# Patient Record
Sex: Male | Born: 1940 | Race: White | Hispanic: No | Smoking: Former smoker
Health system: Southern US, Community
[De-identification: ages and names within clinical notes are randomized; demographics above are authoritative.]

## PROBLEM LIST (undated history)

## (undated) DIAGNOSIS — I10 Essential (primary) hypertension: Secondary | ICD-10-CM

## (undated) DIAGNOSIS — I4891 Unspecified atrial fibrillation: Secondary | ICD-10-CM

## (undated) DIAGNOSIS — Z7901 Long term (current) use of anticoagulants: Secondary | ICD-10-CM

## (undated) DIAGNOSIS — J439 Emphysema, unspecified: Secondary | ICD-10-CM

## (undated) DIAGNOSIS — I639 Cerebral infarction, unspecified: Secondary | ICD-10-CM

## (undated) DIAGNOSIS — Z9229 Personal history of other drug therapy: Secondary | ICD-10-CM

## (undated) DIAGNOSIS — E079 Disorder of thyroid, unspecified: Secondary | ICD-10-CM

## (undated) DIAGNOSIS — J449 Chronic obstructive pulmonary disease, unspecified: Secondary | ICD-10-CM

## (undated) HISTORY — DX: Unspecified atrial fibrillation: I48.91

## (undated) HISTORY — PX: CORONARY ARTERY BYPASS GRAFT: SHX141

---

## 2001-10-30 ENCOUNTER — Inpatient Hospital Stay (HOSPITAL_COMMUNITY): Admission: EM | Admit: 2001-10-30 | Discharge: 2001-11-06 | Payer: Self-pay | Admitting: Cardiology

## 2001-10-31 ENCOUNTER — Encounter: Payer: Self-pay | Admitting: Cardiology

## 2001-11-01 ENCOUNTER — Encounter: Payer: Self-pay | Admitting: Thoracic Surgery (Cardiothoracic Vascular Surgery)

## 2001-11-02 ENCOUNTER — Encounter: Payer: Self-pay | Admitting: Thoracic Surgery (Cardiothoracic Vascular Surgery)

## 2001-11-03 ENCOUNTER — Encounter: Payer: Self-pay | Admitting: Surgery

## 2001-11-28 ENCOUNTER — Encounter
Admission: RE | Admit: 2001-11-28 | Discharge: 2001-11-28 | Payer: Self-pay | Admitting: Thoracic Surgery (Cardiothoracic Vascular Surgery)

## 2001-11-28 ENCOUNTER — Encounter: Payer: Self-pay | Admitting: Thoracic Surgery (Cardiothoracic Vascular Surgery)

## 2005-09-05 ENCOUNTER — Other Ambulatory Visit: Payer: Self-pay

## 2005-09-05 ENCOUNTER — Inpatient Hospital Stay: Payer: Self-pay | Admitting: Internal Medicine

## 2008-06-23 ENCOUNTER — Inpatient Hospital Stay: Payer: Self-pay | Admitting: Internal Medicine

## 2011-05-03 DIAGNOSIS — I639 Cerebral infarction, unspecified: Secondary | ICD-10-CM

## 2011-05-03 HISTORY — DX: Cerebral infarction, unspecified: I63.9

## 2012-07-15 ENCOUNTER — Inpatient Hospital Stay: Payer: Self-pay | Admitting: Family Medicine

## 2012-07-15 DIAGNOSIS — I4891 Unspecified atrial fibrillation: Secondary | ICD-10-CM | POA: Diagnosis not present

## 2012-07-15 DIAGNOSIS — R000 Tachycardia, unspecified: Secondary | ICD-10-CM | POA: Diagnosis not present

## 2012-07-15 DIAGNOSIS — I10 Essential (primary) hypertension: Secondary | ICD-10-CM | POA: Diagnosis not present

## 2012-07-15 DIAGNOSIS — F17200 Nicotine dependence, unspecified, uncomplicated: Secondary | ICD-10-CM | POA: Diagnosis not present

## 2012-07-15 DIAGNOSIS — I6350 Cerebral infarction due to unspecified occlusion or stenosis of unspecified cerebral artery: Secondary | ICD-10-CM | POA: Diagnosis not present

## 2012-07-15 LAB — APTT: Activated PTT: 39.1 secs — ABNORMAL HIGH (ref 23.6–35.9)

## 2012-07-15 LAB — TROPONIN I
Troponin-I: 1.7 ng/mL — ABNORMAL HIGH
Troponin-I: 1.3 ng/mL — ABNORMAL HIGH

## 2012-07-15 LAB — COMPREHENSIVE METABOLIC PANEL
Albumin: 3.9 g/dL (ref 3.4–5.0)
Alkaline Phosphatase: 74 U/L (ref 50–136)
Anion Gap: 7 (ref 7–16)
BUN: 15 mg/dL (ref 7–18)
Bilirubin,Total: 0.6 mg/dL (ref 0.2–1.0)
Calcium, Total: 9 mg/dL (ref 8.5–10.1)
Chloride: 103 mmol/L (ref 98–107)
Co2: 27 mmol/L (ref 21–32)
Creatinine: 0.89 mg/dL (ref 0.60–1.30)
EGFR (African American): >60
EGFR (Non-African Amer.): >60
Glucose: 92 mg/dL (ref 65–99)
Osmolality: 274 (ref 275–301)
Potassium: 4 mmol/L (ref 3.5–5.1)
SGOT(AST): 33 U/L (ref 15–37)
SGPT (ALT): 27 U/L (ref 12–78)
Sodium: 137 mmol/L (ref 136–145)
Total Protein: 7.7 g/dL (ref 6.4–8.2)

## 2012-07-15 LAB — CBC
HCT: 47.5 % (ref 40.0–52.0)
HGB: 16.7 g/dL (ref 13.0–18.0)
MCH: 34.2 pg — ABNORMAL HIGH (ref 26.0–34.0)
MCHC: 35.2 g/dL (ref 32.0–36.0)
MCV: 97 fL (ref 80–100)
Platelet: 233 10*3/uL (ref 150–440)
RBC: 4.89 10*6/uL (ref 4.40–5.90)
RDW: 13.8 % (ref 11.5–14.5)
WBC: 8.4 10*3/uL (ref 3.8–10.6)

## 2012-07-15 LAB — URINALYSIS, COMPLETE
Bilirubin,UR: NEGATIVE
Leukocyte Esterase: NEGATIVE
Protein: 30
Specific Gravity: 1.015 (ref 1.003–1.030)
Squamous Epithelial: NONE SEEN
WBC UR: <1 /HPF (ref 0–5)

## 2012-07-15 LAB — PROTIME-INR
INR: 1.1
Prothrombin Time: 14.2 secs (ref 11.5–14.7)

## 2012-07-15 LAB — CK TOTAL AND CKMB (NOT AT ARMC)
CK, Total: 113 U/L (ref 35–232)
CK-MB: 2.2 ng/mL (ref 0.5–3.6)

## 2012-07-16 LAB — LIPID PANEL
Cholesterol: 196 mg/dL (ref 0–200)
HDL Cholesterol: 43 mg/dL (ref 40–60)
Ldl Cholesterol, Calc: 131 mg/dL — ABNORMAL HIGH (ref 0–100)
Triglycerides: 111 mg/dL (ref 0–200)
VLDL Cholesterol, Calc: 22 mg/dL (ref 5–40)

## 2012-07-16 LAB — TSH: Thyroid Stimulating Horm: 24.6 u[IU]/mL — ABNORMAL HIGH

## 2012-07-16 LAB — TROPONIN I: Troponin-I: 1.1 ng/mL — ABNORMAL HIGH

## 2012-07-16 LAB — HEMOGLOBIN A1C: Hemoglobin A1C: 5.3 % (ref 4.2–6.3)

## 2012-07-18 LAB — CBC WITH DIFFERENTIAL/PLATELET
Basophil #: 0.1 10*3/uL (ref 0.0–0.1)
Basophil %: 0.8 %
Eosinophil #: 0.2 10*3/uL (ref 0.0–0.7)
Eosinophil %: 3.1 %
HGB: 15.5 g/dL (ref 13.0–18.0)
Lymphocyte #: 1.1 10*3/uL (ref 1.0–3.6)
MCHC: 35.7 g/dL (ref 32.0–36.0)
Neutrophil #: 5.3 10*3/uL (ref 1.4–6.5)
Neutrophil %: 73.2 %
Platelet: 201 10*3/uL (ref 150–440)
RDW: 13.7 % (ref 11.5–14.5)
WBC: 7.3 10*3/uL (ref 3.8–10.6)

## 2012-07-18 LAB — BASIC METABOLIC PANEL
BUN: 16 mg/dL (ref 7–18)
Calcium, Total: 8.7 mg/dL (ref 8.5–10.1)
Co2: 25 mmol/L (ref 21–32)
Creatinine: 0.74 mg/dL (ref 0.60–1.30)
EGFR (African American): >60
Glucose: 93 mg/dL (ref 65–99)
Osmolality: 278 (ref 275–301)
Sodium: 139 mmol/L (ref 136–145)

## 2012-07-20 DIAGNOSIS — I69921 Dysphasia following unspecified cerebrovascular disease: Secondary | ICD-10-CM | POA: Diagnosis not present

## 2012-07-20 DIAGNOSIS — I5022 Chronic systolic (congestive) heart failure: Secondary | ICD-10-CM | POA: Diagnosis not present

## 2012-07-20 DIAGNOSIS — I214 Non-ST elevation (NSTEMI) myocardial infarction: Secondary | ICD-10-CM | POA: Diagnosis not present

## 2012-07-20 DIAGNOSIS — J441 Chronic obstructive pulmonary disease with (acute) exacerbation: Secondary | ICD-10-CM | POA: Diagnosis not present

## 2012-07-20 DIAGNOSIS — I69920 Aphasia following unspecified cerebrovascular disease: Secondary | ICD-10-CM | POA: Diagnosis not present

## 2012-07-20 DIAGNOSIS — I4891 Unspecified atrial fibrillation: Secondary | ICD-10-CM | POA: Diagnosis not present

## 2012-07-23 DIAGNOSIS — I69920 Aphasia following unspecified cerebrovascular disease: Secondary | ICD-10-CM | POA: Diagnosis not present

## 2012-07-23 DIAGNOSIS — I5022 Chronic systolic (congestive) heart failure: Secondary | ICD-10-CM | POA: Diagnosis not present

## 2012-07-23 DIAGNOSIS — J441 Chronic obstructive pulmonary disease with (acute) exacerbation: Secondary | ICD-10-CM | POA: Diagnosis not present

## 2012-07-23 DIAGNOSIS — I214 Non-ST elevation (NSTEMI) myocardial infarction: Secondary | ICD-10-CM | POA: Diagnosis not present

## 2012-07-23 DIAGNOSIS — I69921 Dysphasia following unspecified cerebrovascular disease: Secondary | ICD-10-CM | POA: Diagnosis not present

## 2012-07-23 DIAGNOSIS — I4891 Unspecified atrial fibrillation: Secondary | ICD-10-CM | POA: Diagnosis not present

## 2012-07-24 DIAGNOSIS — J441 Chronic obstructive pulmonary disease with (acute) exacerbation: Secondary | ICD-10-CM | POA: Diagnosis not present

## 2012-07-24 DIAGNOSIS — I4891 Unspecified atrial fibrillation: Secondary | ICD-10-CM | POA: Diagnosis not present

## 2012-07-24 DIAGNOSIS — I5022 Chronic systolic (congestive) heart failure: Secondary | ICD-10-CM | POA: Diagnosis not present

## 2012-07-24 DIAGNOSIS — I214 Non-ST elevation (NSTEMI) myocardial infarction: Secondary | ICD-10-CM | POA: Diagnosis not present

## 2012-07-24 DIAGNOSIS — I69921 Dysphasia following unspecified cerebrovascular disease: Secondary | ICD-10-CM | POA: Diagnosis not present

## 2012-07-24 DIAGNOSIS — I69920 Aphasia following unspecified cerebrovascular disease: Secondary | ICD-10-CM | POA: Diagnosis not present

## 2012-07-25 DIAGNOSIS — I214 Non-ST elevation (NSTEMI) myocardial infarction: Secondary | ICD-10-CM | POA: Diagnosis not present

## 2012-07-25 DIAGNOSIS — I4891 Unspecified atrial fibrillation: Secondary | ICD-10-CM | POA: Diagnosis not present

## 2012-07-25 DIAGNOSIS — J441 Chronic obstructive pulmonary disease with (acute) exacerbation: Secondary | ICD-10-CM | POA: Diagnosis not present

## 2012-07-25 DIAGNOSIS — I5022 Chronic systolic (congestive) heart failure: Secondary | ICD-10-CM | POA: Diagnosis not present

## 2012-07-25 DIAGNOSIS — I69921 Dysphasia following unspecified cerebrovascular disease: Secondary | ICD-10-CM | POA: Diagnosis not present

## 2012-07-25 DIAGNOSIS — I69920 Aphasia following unspecified cerebrovascular disease: Secondary | ICD-10-CM | POA: Diagnosis not present

## 2012-07-27 DIAGNOSIS — J441 Chronic obstructive pulmonary disease with (acute) exacerbation: Secondary | ICD-10-CM | POA: Diagnosis not present

## 2012-07-27 DIAGNOSIS — I5022 Chronic systolic (congestive) heart failure: Secondary | ICD-10-CM | POA: Diagnosis not present

## 2012-07-27 DIAGNOSIS — I4891 Unspecified atrial fibrillation: Secondary | ICD-10-CM | POA: Diagnosis not present

## 2012-07-27 DIAGNOSIS — I69921 Dysphasia following unspecified cerebrovascular disease: Secondary | ICD-10-CM | POA: Diagnosis not present

## 2012-07-27 DIAGNOSIS — I214 Non-ST elevation (NSTEMI) myocardial infarction: Secondary | ICD-10-CM | POA: Diagnosis not present

## 2012-07-27 DIAGNOSIS — G459 Transient cerebral ischemic attack, unspecified: Secondary | ICD-10-CM | POA: Diagnosis not present

## 2012-07-27 DIAGNOSIS — I69920 Aphasia following unspecified cerebrovascular disease: Secondary | ICD-10-CM | POA: Diagnosis not present

## 2012-07-30 DIAGNOSIS — I69921 Dysphasia following unspecified cerebrovascular disease: Secondary | ICD-10-CM | POA: Diagnosis not present

## 2012-07-30 DIAGNOSIS — J441 Chronic obstructive pulmonary disease with (acute) exacerbation: Secondary | ICD-10-CM | POA: Diagnosis not present

## 2012-07-30 DIAGNOSIS — I5022 Chronic systolic (congestive) heart failure: Secondary | ICD-10-CM | POA: Diagnosis not present

## 2012-07-30 DIAGNOSIS — I214 Non-ST elevation (NSTEMI) myocardial infarction: Secondary | ICD-10-CM | POA: Diagnosis not present

## 2012-07-30 DIAGNOSIS — I4891 Unspecified atrial fibrillation: Secondary | ICD-10-CM | POA: Diagnosis not present

## 2012-07-30 DIAGNOSIS — I69920 Aphasia following unspecified cerebrovascular disease: Secondary | ICD-10-CM | POA: Diagnosis not present

## 2012-08-01 DIAGNOSIS — I69921 Dysphasia following unspecified cerebrovascular disease: Secondary | ICD-10-CM | POA: Diagnosis not present

## 2012-08-01 DIAGNOSIS — I5022 Chronic systolic (congestive) heart failure: Secondary | ICD-10-CM | POA: Diagnosis not present

## 2012-08-01 DIAGNOSIS — I69920 Aphasia following unspecified cerebrovascular disease: Secondary | ICD-10-CM | POA: Diagnosis not present

## 2012-08-01 DIAGNOSIS — I4891 Unspecified atrial fibrillation: Secondary | ICD-10-CM | POA: Diagnosis not present

## 2012-08-01 DIAGNOSIS — I214 Non-ST elevation (NSTEMI) myocardial infarction: Secondary | ICD-10-CM | POA: Diagnosis not present

## 2012-08-01 DIAGNOSIS — J441 Chronic obstructive pulmonary disease with (acute) exacerbation: Secondary | ICD-10-CM | POA: Diagnosis not present

## 2012-08-02 DIAGNOSIS — I69921 Dysphasia following unspecified cerebrovascular disease: Secondary | ICD-10-CM | POA: Diagnosis not present

## 2012-08-02 DIAGNOSIS — I214 Non-ST elevation (NSTEMI) myocardial infarction: Secondary | ICD-10-CM | POA: Diagnosis not present

## 2012-08-02 DIAGNOSIS — I69920 Aphasia following unspecified cerebrovascular disease: Secondary | ICD-10-CM | POA: Diagnosis not present

## 2012-08-02 DIAGNOSIS — I5022 Chronic systolic (congestive) heart failure: Secondary | ICD-10-CM | POA: Diagnosis not present

## 2012-08-02 DIAGNOSIS — J441 Chronic obstructive pulmonary disease with (acute) exacerbation: Secondary | ICD-10-CM | POA: Diagnosis not present

## 2012-08-02 DIAGNOSIS — I4891 Unspecified atrial fibrillation: Secondary | ICD-10-CM | POA: Diagnosis not present

## 2012-08-03 DIAGNOSIS — I69921 Dysphasia following unspecified cerebrovascular disease: Secondary | ICD-10-CM | POA: Diagnosis not present

## 2012-08-03 DIAGNOSIS — I69920 Aphasia following unspecified cerebrovascular disease: Secondary | ICD-10-CM | POA: Diagnosis not present

## 2012-08-03 DIAGNOSIS — J441 Chronic obstructive pulmonary disease with (acute) exacerbation: Secondary | ICD-10-CM | POA: Diagnosis not present

## 2012-08-03 DIAGNOSIS — I5022 Chronic systolic (congestive) heart failure: Secondary | ICD-10-CM | POA: Diagnosis not present

## 2012-08-03 DIAGNOSIS — I214 Non-ST elevation (NSTEMI) myocardial infarction: Secondary | ICD-10-CM | POA: Diagnosis not present

## 2012-08-03 DIAGNOSIS — I4891 Unspecified atrial fibrillation: Secondary | ICD-10-CM | POA: Diagnosis not present

## 2012-08-06 DIAGNOSIS — I5022 Chronic systolic (congestive) heart failure: Secondary | ICD-10-CM | POA: Diagnosis not present

## 2012-08-06 DIAGNOSIS — I69920 Aphasia following unspecified cerebrovascular disease: Secondary | ICD-10-CM | POA: Diagnosis not present

## 2012-08-06 DIAGNOSIS — I69921 Dysphasia following unspecified cerebrovascular disease: Secondary | ICD-10-CM | POA: Diagnosis not present

## 2012-08-06 DIAGNOSIS — I214 Non-ST elevation (NSTEMI) myocardial infarction: Secondary | ICD-10-CM | POA: Diagnosis not present

## 2012-08-06 DIAGNOSIS — G459 Transient cerebral ischemic attack, unspecified: Secondary | ICD-10-CM | POA: Diagnosis not present

## 2012-08-06 DIAGNOSIS — I4891 Unspecified atrial fibrillation: Secondary | ICD-10-CM | POA: Diagnosis not present

## 2012-08-06 DIAGNOSIS — J441 Chronic obstructive pulmonary disease with (acute) exacerbation: Secondary | ICD-10-CM | POA: Diagnosis not present

## 2012-08-07 DIAGNOSIS — I69920 Aphasia following unspecified cerebrovascular disease: Secondary | ICD-10-CM | POA: Diagnosis not present

## 2012-08-07 DIAGNOSIS — I69921 Dysphasia following unspecified cerebrovascular disease: Secondary | ICD-10-CM | POA: Diagnosis not present

## 2012-08-07 DIAGNOSIS — J441 Chronic obstructive pulmonary disease with (acute) exacerbation: Secondary | ICD-10-CM | POA: Diagnosis not present

## 2012-08-07 DIAGNOSIS — I4891 Unspecified atrial fibrillation: Secondary | ICD-10-CM | POA: Diagnosis not present

## 2012-08-07 DIAGNOSIS — I214 Non-ST elevation (NSTEMI) myocardial infarction: Secondary | ICD-10-CM | POA: Diagnosis not present

## 2012-08-07 DIAGNOSIS — I5022 Chronic systolic (congestive) heart failure: Secondary | ICD-10-CM | POA: Diagnosis not present

## 2012-08-08 DIAGNOSIS — I4891 Unspecified atrial fibrillation: Secondary | ICD-10-CM | POA: Diagnosis not present

## 2012-08-08 DIAGNOSIS — I69920 Aphasia following unspecified cerebrovascular disease: Secondary | ICD-10-CM | POA: Diagnosis not present

## 2012-08-08 DIAGNOSIS — I5022 Chronic systolic (congestive) heart failure: Secondary | ICD-10-CM | POA: Diagnosis not present

## 2012-08-08 DIAGNOSIS — J441 Chronic obstructive pulmonary disease with (acute) exacerbation: Secondary | ICD-10-CM | POA: Diagnosis not present

## 2012-08-08 DIAGNOSIS — I69921 Dysphasia following unspecified cerebrovascular disease: Secondary | ICD-10-CM | POA: Diagnosis not present

## 2012-08-08 DIAGNOSIS — I214 Non-ST elevation (NSTEMI) myocardial infarction: Secondary | ICD-10-CM | POA: Diagnosis not present

## 2012-08-10 DIAGNOSIS — I4891 Unspecified atrial fibrillation: Secondary | ICD-10-CM | POA: Diagnosis not present

## 2012-08-10 DIAGNOSIS — J441 Chronic obstructive pulmonary disease with (acute) exacerbation: Secondary | ICD-10-CM | POA: Diagnosis not present

## 2012-08-10 DIAGNOSIS — I69921 Dysphasia following unspecified cerebrovascular disease: Secondary | ICD-10-CM | POA: Diagnosis not present

## 2012-08-10 DIAGNOSIS — I214 Non-ST elevation (NSTEMI) myocardial infarction: Secondary | ICD-10-CM | POA: Diagnosis not present

## 2012-08-10 DIAGNOSIS — I5022 Chronic systolic (congestive) heart failure: Secondary | ICD-10-CM | POA: Diagnosis not present

## 2012-08-10 DIAGNOSIS — I69920 Aphasia following unspecified cerebrovascular disease: Secondary | ICD-10-CM | POA: Diagnosis not present

## 2012-08-13 DIAGNOSIS — J441 Chronic obstructive pulmonary disease with (acute) exacerbation: Secondary | ICD-10-CM | POA: Diagnosis not present

## 2012-08-13 DIAGNOSIS — I69921 Dysphasia following unspecified cerebrovascular disease: Secondary | ICD-10-CM | POA: Diagnosis not present

## 2012-08-13 DIAGNOSIS — I214 Non-ST elevation (NSTEMI) myocardial infarction: Secondary | ICD-10-CM | POA: Diagnosis not present

## 2012-08-13 DIAGNOSIS — I5022 Chronic systolic (congestive) heart failure: Secondary | ICD-10-CM | POA: Diagnosis not present

## 2012-08-13 DIAGNOSIS — I69920 Aphasia following unspecified cerebrovascular disease: Secondary | ICD-10-CM | POA: Diagnosis not present

## 2012-08-13 DIAGNOSIS — I4891 Unspecified atrial fibrillation: Secondary | ICD-10-CM | POA: Diagnosis not present

## 2012-08-14 DIAGNOSIS — I214 Non-ST elevation (NSTEMI) myocardial infarction: Secondary | ICD-10-CM | POA: Diagnosis not present

## 2012-08-14 DIAGNOSIS — I69921 Dysphasia following unspecified cerebrovascular disease: Secondary | ICD-10-CM | POA: Diagnosis not present

## 2012-08-14 DIAGNOSIS — I5022 Chronic systolic (congestive) heart failure: Secondary | ICD-10-CM | POA: Diagnosis not present

## 2012-08-14 DIAGNOSIS — J441 Chronic obstructive pulmonary disease with (acute) exacerbation: Secondary | ICD-10-CM | POA: Diagnosis not present

## 2012-08-14 DIAGNOSIS — I69920 Aphasia following unspecified cerebrovascular disease: Secondary | ICD-10-CM | POA: Diagnosis not present

## 2012-08-14 DIAGNOSIS — I4891 Unspecified atrial fibrillation: Secondary | ICD-10-CM | POA: Diagnosis not present

## 2012-08-16 DIAGNOSIS — I69921 Dysphasia following unspecified cerebrovascular disease: Secondary | ICD-10-CM | POA: Diagnosis not present

## 2012-08-16 DIAGNOSIS — I5022 Chronic systolic (congestive) heart failure: Secondary | ICD-10-CM | POA: Diagnosis not present

## 2012-08-16 DIAGNOSIS — J441 Chronic obstructive pulmonary disease with (acute) exacerbation: Secondary | ICD-10-CM | POA: Diagnosis not present

## 2012-08-16 DIAGNOSIS — I4891 Unspecified atrial fibrillation: Secondary | ICD-10-CM | POA: Diagnosis not present

## 2012-08-16 DIAGNOSIS — I214 Non-ST elevation (NSTEMI) myocardial infarction: Secondary | ICD-10-CM | POA: Diagnosis not present

## 2012-08-16 DIAGNOSIS — I69920 Aphasia following unspecified cerebrovascular disease: Secondary | ICD-10-CM | POA: Diagnosis not present

## 2012-08-20 DIAGNOSIS — I69920 Aphasia following unspecified cerebrovascular disease: Secondary | ICD-10-CM | POA: Diagnosis not present

## 2012-08-20 DIAGNOSIS — I4891 Unspecified atrial fibrillation: Secondary | ICD-10-CM | POA: Diagnosis not present

## 2012-08-20 DIAGNOSIS — I69921 Dysphasia following unspecified cerebrovascular disease: Secondary | ICD-10-CM | POA: Diagnosis not present

## 2012-08-20 DIAGNOSIS — I214 Non-ST elevation (NSTEMI) myocardial infarction: Secondary | ICD-10-CM | POA: Diagnosis not present

## 2012-08-20 DIAGNOSIS — I5022 Chronic systolic (congestive) heart failure: Secondary | ICD-10-CM | POA: Diagnosis not present

## 2012-08-20 DIAGNOSIS — J441 Chronic obstructive pulmonary disease with (acute) exacerbation: Secondary | ICD-10-CM | POA: Diagnosis not present

## 2012-08-22 DIAGNOSIS — J441 Chronic obstructive pulmonary disease with (acute) exacerbation: Secondary | ICD-10-CM | POA: Diagnosis not present

## 2012-08-22 DIAGNOSIS — I5022 Chronic systolic (congestive) heart failure: Secondary | ICD-10-CM | POA: Diagnosis not present

## 2012-08-22 DIAGNOSIS — I214 Non-ST elevation (NSTEMI) myocardial infarction: Secondary | ICD-10-CM | POA: Diagnosis not present

## 2012-08-22 DIAGNOSIS — I69921 Dysphasia following unspecified cerebrovascular disease: Secondary | ICD-10-CM | POA: Diagnosis not present

## 2012-08-22 DIAGNOSIS — I4891 Unspecified atrial fibrillation: Secondary | ICD-10-CM | POA: Diagnosis not present

## 2012-08-22 DIAGNOSIS — I69920 Aphasia following unspecified cerebrovascular disease: Secondary | ICD-10-CM | POA: Diagnosis not present

## 2012-08-23 DIAGNOSIS — I69921 Dysphasia following unspecified cerebrovascular disease: Secondary | ICD-10-CM | POA: Diagnosis not present

## 2012-08-23 DIAGNOSIS — J441 Chronic obstructive pulmonary disease with (acute) exacerbation: Secondary | ICD-10-CM | POA: Diagnosis not present

## 2012-08-23 DIAGNOSIS — I4891 Unspecified atrial fibrillation: Secondary | ICD-10-CM | POA: Diagnosis not present

## 2012-08-23 DIAGNOSIS — I69920 Aphasia following unspecified cerebrovascular disease: Secondary | ICD-10-CM | POA: Diagnosis not present

## 2012-08-23 DIAGNOSIS — I214 Non-ST elevation (NSTEMI) myocardial infarction: Secondary | ICD-10-CM | POA: Diagnosis not present

## 2012-08-23 DIAGNOSIS — I5022 Chronic systolic (congestive) heart failure: Secondary | ICD-10-CM | POA: Diagnosis not present

## 2012-08-27 DIAGNOSIS — I69921 Dysphasia following unspecified cerebrovascular disease: Secondary | ICD-10-CM | POA: Diagnosis not present

## 2012-08-27 DIAGNOSIS — I69920 Aphasia following unspecified cerebrovascular disease: Secondary | ICD-10-CM | POA: Diagnosis not present

## 2012-08-27 DIAGNOSIS — J441 Chronic obstructive pulmonary disease with (acute) exacerbation: Secondary | ICD-10-CM | POA: Diagnosis not present

## 2012-08-27 DIAGNOSIS — I4891 Unspecified atrial fibrillation: Secondary | ICD-10-CM | POA: Diagnosis not present

## 2012-08-27 DIAGNOSIS — I5022 Chronic systolic (congestive) heart failure: Secondary | ICD-10-CM | POA: Diagnosis not present

## 2012-08-27 DIAGNOSIS — I214 Non-ST elevation (NSTEMI) myocardial infarction: Secondary | ICD-10-CM | POA: Diagnosis not present

## 2012-08-29 DIAGNOSIS — I5022 Chronic systolic (congestive) heart failure: Secondary | ICD-10-CM | POA: Diagnosis not present

## 2012-08-29 DIAGNOSIS — I4891 Unspecified atrial fibrillation: Secondary | ICD-10-CM | POA: Diagnosis not present

## 2012-08-29 DIAGNOSIS — I214 Non-ST elevation (NSTEMI) myocardial infarction: Secondary | ICD-10-CM | POA: Diagnosis not present

## 2012-08-29 DIAGNOSIS — I69921 Dysphasia following unspecified cerebrovascular disease: Secondary | ICD-10-CM | POA: Diagnosis not present

## 2012-08-29 DIAGNOSIS — I69920 Aphasia following unspecified cerebrovascular disease: Secondary | ICD-10-CM | POA: Diagnosis not present

## 2012-08-29 DIAGNOSIS — J441 Chronic obstructive pulmonary disease with (acute) exacerbation: Secondary | ICD-10-CM | POA: Diagnosis not present

## 2012-08-31 DIAGNOSIS — J441 Chronic obstructive pulmonary disease with (acute) exacerbation: Secondary | ICD-10-CM | POA: Diagnosis not present

## 2012-08-31 DIAGNOSIS — I69921 Dysphasia following unspecified cerebrovascular disease: Secondary | ICD-10-CM | POA: Diagnosis not present

## 2012-08-31 DIAGNOSIS — I4891 Unspecified atrial fibrillation: Secondary | ICD-10-CM | POA: Diagnosis not present

## 2012-08-31 DIAGNOSIS — I5022 Chronic systolic (congestive) heart failure: Secondary | ICD-10-CM | POA: Diagnosis not present

## 2012-08-31 DIAGNOSIS — I214 Non-ST elevation (NSTEMI) myocardial infarction: Secondary | ICD-10-CM | POA: Diagnosis not present

## 2012-08-31 DIAGNOSIS — I69920 Aphasia following unspecified cerebrovascular disease: Secondary | ICD-10-CM | POA: Diagnosis not present

## 2012-09-03 DIAGNOSIS — I5022 Chronic systolic (congestive) heart failure: Secondary | ICD-10-CM | POA: Diagnosis not present

## 2012-09-03 DIAGNOSIS — I4891 Unspecified atrial fibrillation: Secondary | ICD-10-CM | POA: Diagnosis not present

## 2012-09-03 DIAGNOSIS — I69920 Aphasia following unspecified cerebrovascular disease: Secondary | ICD-10-CM | POA: Diagnosis not present

## 2012-09-03 DIAGNOSIS — I69921 Dysphasia following unspecified cerebrovascular disease: Secondary | ICD-10-CM | POA: Diagnosis not present

## 2012-09-03 DIAGNOSIS — J441 Chronic obstructive pulmonary disease with (acute) exacerbation: Secondary | ICD-10-CM | POA: Diagnosis not present

## 2012-09-03 DIAGNOSIS — I214 Non-ST elevation (NSTEMI) myocardial infarction: Secondary | ICD-10-CM | POA: Diagnosis not present

## 2012-09-05 DIAGNOSIS — I69920 Aphasia following unspecified cerebrovascular disease: Secondary | ICD-10-CM | POA: Diagnosis not present

## 2012-09-05 DIAGNOSIS — I69921 Dysphasia following unspecified cerebrovascular disease: Secondary | ICD-10-CM | POA: Diagnosis not present

## 2012-09-05 DIAGNOSIS — I5022 Chronic systolic (congestive) heart failure: Secondary | ICD-10-CM | POA: Diagnosis not present

## 2012-09-05 DIAGNOSIS — I214 Non-ST elevation (NSTEMI) myocardial infarction: Secondary | ICD-10-CM | POA: Diagnosis not present

## 2012-09-05 DIAGNOSIS — I4891 Unspecified atrial fibrillation: Secondary | ICD-10-CM | POA: Diagnosis not present

## 2012-09-05 DIAGNOSIS — J441 Chronic obstructive pulmonary disease with (acute) exacerbation: Secondary | ICD-10-CM | POA: Diagnosis not present

## 2012-09-06 ENCOUNTER — Ambulatory Visit: Payer: Self-pay | Admitting: Internal Medicine

## 2012-09-06 DIAGNOSIS — M7989 Other specified soft tissue disorders: Secondary | ICD-10-CM | POA: Diagnosis not present

## 2012-09-06 DIAGNOSIS — M25473 Effusion, unspecified ankle: Secondary | ICD-10-CM | POA: Diagnosis not present

## 2012-09-06 DIAGNOSIS — Z7901 Long term (current) use of anticoagulants: Secondary | ICD-10-CM | POA: Diagnosis not present

## 2012-09-06 DIAGNOSIS — S8990XA Unspecified injury of unspecified lower leg, initial encounter: Secondary | ICD-10-CM | POA: Diagnosis not present

## 2012-09-06 DIAGNOSIS — M25476 Effusion, unspecified foot: Secondary | ICD-10-CM | POA: Diagnosis not present

## 2012-09-06 DIAGNOSIS — S82209A Unspecified fracture of shaft of unspecified tibia, initial encounter for closed fracture: Secondary | ICD-10-CM | POA: Diagnosis not present

## 2012-09-06 DIAGNOSIS — I4891 Unspecified atrial fibrillation: Secondary | ICD-10-CM | POA: Diagnosis not present

## 2012-09-07 DIAGNOSIS — S93409A Sprain of unspecified ligament of unspecified ankle, initial encounter: Secondary | ICD-10-CM | POA: Diagnosis not present

## 2012-09-07 DIAGNOSIS — S93419A Sprain of calcaneofibular ligament of unspecified ankle, initial encounter: Secondary | ICD-10-CM | POA: Diagnosis not present

## 2012-09-10 DIAGNOSIS — I69920 Aphasia following unspecified cerebrovascular disease: Secondary | ICD-10-CM | POA: Diagnosis not present

## 2012-09-10 DIAGNOSIS — I5022 Chronic systolic (congestive) heart failure: Secondary | ICD-10-CM | POA: Diagnosis not present

## 2012-09-10 DIAGNOSIS — I4891 Unspecified atrial fibrillation: Secondary | ICD-10-CM | POA: Diagnosis not present

## 2012-09-10 DIAGNOSIS — J441 Chronic obstructive pulmonary disease with (acute) exacerbation: Secondary | ICD-10-CM | POA: Diagnosis not present

## 2012-09-10 DIAGNOSIS — I214 Non-ST elevation (NSTEMI) myocardial infarction: Secondary | ICD-10-CM | POA: Diagnosis not present

## 2012-09-10 DIAGNOSIS — I69921 Dysphasia following unspecified cerebrovascular disease: Secondary | ICD-10-CM | POA: Diagnosis not present

## 2012-09-11 ENCOUNTER — Ambulatory Visit: Payer: Self-pay | Admitting: Internal Medicine

## 2012-09-11 DIAGNOSIS — S93409A Sprain of unspecified ligament of unspecified ankle, initial encounter: Secondary | ICD-10-CM | POA: Diagnosis not present

## 2012-09-11 DIAGNOSIS — G459 Transient cerebral ischemic attack, unspecified: Secondary | ICD-10-CM | POA: Diagnosis not present

## 2012-09-11 DIAGNOSIS — S93419A Sprain of calcaneofibular ligament of unspecified ankle, initial encounter: Secondary | ICD-10-CM | POA: Diagnosis not present

## 2012-09-11 DIAGNOSIS — M7989 Other specified soft tissue disorders: Secondary | ICD-10-CM | POA: Diagnosis not present

## 2012-09-12 DIAGNOSIS — I5022 Chronic systolic (congestive) heart failure: Secondary | ICD-10-CM | POA: Diagnosis not present

## 2012-09-12 DIAGNOSIS — J441 Chronic obstructive pulmonary disease with (acute) exacerbation: Secondary | ICD-10-CM | POA: Diagnosis not present

## 2012-09-12 DIAGNOSIS — I4891 Unspecified atrial fibrillation: Secondary | ICD-10-CM | POA: Diagnosis not present

## 2012-09-12 DIAGNOSIS — I69920 Aphasia following unspecified cerebrovascular disease: Secondary | ICD-10-CM | POA: Diagnosis not present

## 2012-09-12 DIAGNOSIS — I69921 Dysphasia following unspecified cerebrovascular disease: Secondary | ICD-10-CM | POA: Diagnosis not present

## 2012-09-12 DIAGNOSIS — I214 Non-ST elevation (NSTEMI) myocardial infarction: Secondary | ICD-10-CM | POA: Diagnosis not present

## 2012-09-14 DIAGNOSIS — J441 Chronic obstructive pulmonary disease with (acute) exacerbation: Secondary | ICD-10-CM | POA: Diagnosis not present

## 2012-09-14 DIAGNOSIS — I4891 Unspecified atrial fibrillation: Secondary | ICD-10-CM | POA: Diagnosis not present

## 2012-09-14 DIAGNOSIS — I69921 Dysphasia following unspecified cerebrovascular disease: Secondary | ICD-10-CM | POA: Diagnosis not present

## 2012-09-14 DIAGNOSIS — I214 Non-ST elevation (NSTEMI) myocardial infarction: Secondary | ICD-10-CM | POA: Diagnosis not present

## 2012-09-14 DIAGNOSIS — I5022 Chronic systolic (congestive) heart failure: Secondary | ICD-10-CM | POA: Diagnosis not present

## 2012-09-14 DIAGNOSIS — I69920 Aphasia following unspecified cerebrovascular disease: Secondary | ICD-10-CM | POA: Diagnosis not present

## 2012-09-17 DIAGNOSIS — S82409A Unspecified fracture of shaft of unspecified fibula, initial encounter for closed fracture: Secondary | ICD-10-CM | POA: Diagnosis not present

## 2012-09-17 DIAGNOSIS — Z5189 Encounter for other specified aftercare: Secondary | ICD-10-CM | POA: Diagnosis not present

## 2012-09-17 DIAGNOSIS — S82209A Unspecified fracture of shaft of unspecified tibia, initial encounter for closed fracture: Secondary | ICD-10-CM | POA: Diagnosis not present

## 2012-09-17 DIAGNOSIS — S93409A Sprain of unspecified ligament of unspecified ankle, initial encounter: Secondary | ICD-10-CM | POA: Diagnosis not present

## 2012-10-01 DIAGNOSIS — G459 Transient cerebral ischemic attack, unspecified: Secondary | ICD-10-CM | POA: Diagnosis not present

## 2012-10-01 DIAGNOSIS — S93409A Sprain of unspecified ligament of unspecified ankle, initial encounter: Secondary | ICD-10-CM | POA: Diagnosis not present

## 2012-10-01 DIAGNOSIS — S82409A Unspecified fracture of shaft of unspecified fibula, initial encounter for closed fracture: Secondary | ICD-10-CM | POA: Diagnosis not present

## 2012-10-01 DIAGNOSIS — S82209A Unspecified fracture of shaft of unspecified tibia, initial encounter for closed fracture: Secondary | ICD-10-CM | POA: Diagnosis not present

## 2012-10-23 DIAGNOSIS — E785 Hyperlipidemia, unspecified: Secondary | ICD-10-CM | POA: Diagnosis not present

## 2012-10-23 DIAGNOSIS — R630 Anorexia: Secondary | ICD-10-CM | POA: Diagnosis not present

## 2012-10-23 DIAGNOSIS — D649 Anemia, unspecified: Secondary | ICD-10-CM | POA: Diagnosis not present

## 2012-10-23 DIAGNOSIS — R634 Abnormal weight loss: Secondary | ICD-10-CM | POA: Diagnosis not present

## 2012-10-23 DIAGNOSIS — I4891 Unspecified atrial fibrillation: Secondary | ICD-10-CM | POA: Diagnosis not present

## 2012-10-24 ENCOUNTER — Ambulatory Visit: Payer: Self-pay | Admitting: Internal Medicine

## 2012-10-24 DIAGNOSIS — R059 Cough, unspecified: Secondary | ICD-10-CM | POA: Diagnosis not present

## 2012-10-24 DIAGNOSIS — R0601 Orthopnea: Secondary | ICD-10-CM | POA: Diagnosis not present

## 2012-10-24 DIAGNOSIS — R918 Other nonspecific abnormal finding of lung field: Secondary | ICD-10-CM | POA: Diagnosis not present

## 2012-10-24 DIAGNOSIS — F17200 Nicotine dependence, unspecified, uncomplicated: Secondary | ICD-10-CM | POA: Diagnosis not present

## 2012-10-26 ENCOUNTER — Ambulatory Visit: Payer: Self-pay | Admitting: Internal Medicine

## 2012-10-26 DIAGNOSIS — K8020 Calculus of gallbladder without cholecystitis without obstruction: Secondary | ICD-10-CM | POA: Diagnosis not present

## 2012-10-26 DIAGNOSIS — R109 Unspecified abdominal pain: Secondary | ICD-10-CM | POA: Diagnosis not present

## 2012-10-26 DIAGNOSIS — R634 Abnormal weight loss: Secondary | ICD-10-CM | POA: Diagnosis not present

## 2012-11-08 DIAGNOSIS — I4891 Unspecified atrial fibrillation: Secondary | ICD-10-CM | POA: Diagnosis not present

## 2012-11-08 DIAGNOSIS — I471 Supraventricular tachycardia: Secondary | ICD-10-CM | POA: Diagnosis not present

## 2012-12-10 DIAGNOSIS — I10 Essential (primary) hypertension: Secondary | ICD-10-CM | POA: Diagnosis not present

## 2012-12-10 DIAGNOSIS — E785 Hyperlipidemia, unspecified: Secondary | ICD-10-CM | POA: Diagnosis not present

## 2012-12-10 DIAGNOSIS — R791 Abnormal coagulation profile: Secondary | ICD-10-CM | POA: Diagnosis not present

## 2012-12-10 DIAGNOSIS — R634 Abnormal weight loss: Secondary | ICD-10-CM | POA: Diagnosis not present

## 2012-12-10 DIAGNOSIS — Z0000 Encounter for general adult medical examination without abnormal findings: Secondary | ICD-10-CM | POA: Diagnosis not present

## 2012-12-10 DIAGNOSIS — T887XXA Unspecified adverse effect of drug or medicament, initial encounter: Secondary | ICD-10-CM | POA: Diagnosis not present

## 2012-12-17 DIAGNOSIS — I4891 Unspecified atrial fibrillation: Secondary | ICD-10-CM | POA: Diagnosis not present

## 2012-12-17 DIAGNOSIS — R791 Abnormal coagulation profile: Secondary | ICD-10-CM | POA: Diagnosis not present

## 2012-12-17 DIAGNOSIS — I471 Supraventricular tachycardia: Secondary | ICD-10-CM | POA: Diagnosis not present

## 2013-09-16 DIAGNOSIS — R000 Tachycardia, unspecified: Secondary | ICD-10-CM | POA: Diagnosis not present

## 2013-09-16 DIAGNOSIS — I4891 Unspecified atrial fibrillation: Secondary | ICD-10-CM | POA: Diagnosis not present

## 2013-09-16 DIAGNOSIS — Z7901 Long term (current) use of anticoagulants: Secondary | ICD-10-CM | POA: Diagnosis not present

## 2013-10-17 DIAGNOSIS — I4891 Unspecified atrial fibrillation: Secondary | ICD-10-CM | POA: Diagnosis not present

## 2013-11-14 DIAGNOSIS — I4891 Unspecified atrial fibrillation: Secondary | ICD-10-CM | POA: Diagnosis not present

## 2013-11-14 DIAGNOSIS — M67919 Unspecified disorder of synovium and tendon, unspecified shoulder: Secondary | ICD-10-CM | POA: Diagnosis not present

## 2013-11-14 DIAGNOSIS — M25519 Pain in unspecified shoulder: Secondary | ICD-10-CM | POA: Diagnosis not present

## 2013-11-14 DIAGNOSIS — R634 Abnormal weight loss: Secondary | ICD-10-CM | POA: Diagnosis not present

## 2013-11-14 DIAGNOSIS — M719 Bursopathy, unspecified: Secondary | ICD-10-CM | POA: Diagnosis not present

## 2013-11-26 DIAGNOSIS — M719 Bursopathy, unspecified: Secondary | ICD-10-CM | POA: Diagnosis not present

## 2013-11-26 DIAGNOSIS — M75100 Unspecified rotator cuff tear or rupture of unspecified shoulder, not specified as traumatic: Secondary | ICD-10-CM | POA: Diagnosis not present

## 2013-11-26 DIAGNOSIS — M67919 Unspecified disorder of synovium and tendon, unspecified shoulder: Secondary | ICD-10-CM | POA: Diagnosis not present

## 2013-11-26 DIAGNOSIS — IMO0002 Reserved for concepts with insufficient information to code with codable children: Secondary | ICD-10-CM | POA: Diagnosis not present

## 2013-12-17 DIAGNOSIS — I4891 Unspecified atrial fibrillation: Secondary | ICD-10-CM | POA: Diagnosis not present

## 2014-01-17 DIAGNOSIS — I4891 Unspecified atrial fibrillation: Secondary | ICD-10-CM | POA: Diagnosis not present

## 2014-02-14 DIAGNOSIS — I4891 Unspecified atrial fibrillation: Secondary | ICD-10-CM | POA: Diagnosis not present

## 2014-02-14 DIAGNOSIS — Z7901 Long term (current) use of anticoagulants: Secondary | ICD-10-CM | POA: Diagnosis not present

## 2014-03-04 DIAGNOSIS — Z23 Encounter for immunization: Secondary | ICD-10-CM | POA: Diagnosis not present

## 2014-03-07 DIAGNOSIS — Z7901 Long term (current) use of anticoagulants: Secondary | ICD-10-CM | POA: Diagnosis not present

## 2014-03-07 DIAGNOSIS — I4891 Unspecified atrial fibrillation: Secondary | ICD-10-CM | POA: Diagnosis not present

## 2014-03-07 DIAGNOSIS — R634 Abnormal weight loss: Secondary | ICD-10-CM | POA: Diagnosis not present

## 2014-04-16 DIAGNOSIS — Z720 Tobacco use: Secondary | ICD-10-CM | POA: Diagnosis not present

## 2014-04-16 DIAGNOSIS — F17200 Nicotine dependence, unspecified, uncomplicated: Secondary | ICD-10-CM | POA: Diagnosis not present

## 2014-04-16 DIAGNOSIS — I4891 Unspecified atrial fibrillation: Secondary | ICD-10-CM | POA: Diagnosis not present

## 2014-04-16 DIAGNOSIS — Z7901 Long term (current) use of anticoagulants: Secondary | ICD-10-CM | POA: Diagnosis not present

## 2014-04-16 DIAGNOSIS — Z1389 Encounter for screening for other disorder: Secondary | ICD-10-CM | POA: Diagnosis not present

## 2014-04-22 DIAGNOSIS — G459 Transient cerebral ischemic attack, unspecified: Secondary | ICD-10-CM | POA: Diagnosis not present

## 2014-04-22 DIAGNOSIS — Z7901 Long term (current) use of anticoagulants: Secondary | ICD-10-CM | POA: Diagnosis not present

## 2014-04-22 DIAGNOSIS — I4891 Unspecified atrial fibrillation: Secondary | ICD-10-CM | POA: Diagnosis not present

## 2014-05-30 DIAGNOSIS — K210 Gastro-esophageal reflux disease with esophagitis: Secondary | ICD-10-CM | POA: Diagnosis not present

## 2014-05-30 DIAGNOSIS — Z7901 Long term (current) use of anticoagulants: Secondary | ICD-10-CM | POA: Diagnosis not present

## 2014-05-30 DIAGNOSIS — G459 Transient cerebral ischemic attack, unspecified: Secondary | ICD-10-CM | POA: Diagnosis not present

## 2014-05-30 DIAGNOSIS — I4891 Unspecified atrial fibrillation: Secondary | ICD-10-CM | POA: Diagnosis not present

## 2014-08-22 NOTE — Discharge Summary (Signed)
PATIENT NAME:  Ronald [name], Ronald [name] MR#:  [id number] DATE OF BIRTH:  1940/05/31  DATE OF ADMISSION:  07/15/2012 DATE OF DISCHARGE:  07/19/2012  DISPOSITION:  Skilled nursing facility as a request from the daughter. Patient has been recommended to go home with home health not meeting specific criteria for discharge to skilled nursing facility, but the daughter is working out some payment options as private payment for her father to go to skilled nursing.   DISCHARGE DIAGNOSES:   1.  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  2.  Atrial fibrillation, new diagnosis.  3.  Chronic anticoagulation with Xarelto.  4.  Hypertensive emergency, now resolved.  5.  Non-ST elevation myocardial infarction, type 2, due to demand ischemia.  6.  Chronic systolic congestive heart failure with ejection fraction of 20 to 30%.  7.  Severe aortic stenosis.  8.  Hypothyroidism, noncompliant to treatment.  9.  Chronic obstructive pulmonary disease with mild exacerbation on antibiotics.   DISCHARGE MEDICATIONS:  All new medications are lisinopril 10 mg once daily, Xarelto 20 mg once daily, atorvastatin 20 mg once daily, aspirin 81 mg once daily, metoprolol 75 mg daily every 12 hours, fluticasone with salmeterol 250/50 mcg 2 times daily, Spiriva 18 mcg once a day, azithromycin 500 mg every 24 hours for 7 days, levothyroxine 125 mcg once a day.   FOLLOWUP:  Dr. Adrian Blackwater, Dr. Corky Downs and Dr. Conchita Paris in 1 to 2 weeks and follow up also with Dr. Cristopher Peru of neurology for the new CVA.   PERTINENT RESULTS:  CT of the head showing acute to subacute left MCA distribution infarct. MRI of the brain showing acute left frontal lobe infarct in the MCA territory and secondary microangiopathy as well. No hydrocephalus. Ultrasound of the carotid arteries showed no evidence of hemodynamically significant stenosis, only less than 50% stenosis visualized in the right internal carotid  artery. LDL was 131, triglycerides 111 and HDL 43. Hemoglobin A1c was 5.3. Electrolytes were within normal limits. Troponins were elevated at 1.7 down to 1.1. TSH was 24.6 (the patient not taking treatment). White count was 7.3, hemoglobin 15, platelets of 201. Urinalysis was within normal limits. EKG: Atrial fibrillation.   HOSPITAL COURSE:  The patient is a nice 74 year old gentleman with history of CAD, atrial fibrillation, COPD, GERD, hyperlipidemia, hypertension, hypothyroidism not taking treatments at home. He has a history of being a smoker for over 50 years and he drinks alcohol daily, beer, probably 2 beers today. He came to the ER with the chief complaint of not being able to talk. According to the patient's daughter, the patient was good until the morning of admission and then he just presented with facial droop and not able to talk. He was able to walk around, but he fell once on the floor with no injury. He was just very frustrated for not being able to talk. On admission, his troponins were elevated and diagnosed with acute cerebrovascular accident and a non-ST elevation MI. The patient was given Lovenox 1 mg/kg every 12 hours. MRI was done showing the acute infarct as well. He developed atrial fibrillation during this hospitalization for which he was recommended to be on Xarelto. He had hypertensive emergency, but we allow some hypertension. He was put on metoprolol and lisinopril due to his other issues. The patient was evaluated by PT, OT and speech therapy. He had significant problems with swallowing for which his diet has been changed to thickened fluids. The  patient did not require any formal assessments or evaluation by PT and OT. The patient is not having any physical problems and no physical findings of a stroke other than his facial droop and his aphasia. He is able to ambulate safely and he has full strength in 4 extremities. As far as his acute left MCA stroke, the patient is on aspirin  and now Xarelto due to his atrial fibrillation. As far as his aphasia, he needs to continue with speech therapy. As far as his hypertensive emergency, the patient is going to continue metoprolol and lisinopril. As far as his ST elevation MI, the patient is going on aspirin. He was on Plavix. We will stop it as the patient now is on Xarelto. He has significant CHF, but there were no major exacerbations here. His ejection fraction is 25% to 30%. Cardiology is considering adding on Aldactone, but at this moment, the patient isstable  and no need for it, but he will follow up with Dr. Adrian Blackwater as an outpatient. As far as hypothyroidism, he needs to have his TSH rechecked in the next 4 weeks as he has been restarted on Synthroid. As far as his COPD, the patient will continue Zithromax for another 7 days. The patient is discharged in good condition. We were going to discharge him home with home health PT. The daughter is trying to arrange him to go to a skilled nursing facility out of her own pocket. Again, the patient did not meet criteria or was appropriate to go to a skilled nursing facility as per physical therapy and occupational therapy.  ____________________________ Felipa Furnace, MD rsg:si D: 07/19/2012 15:14:00 ET T: 07/19/2012 15:39:05 ET JOB#: [id number] cc: Felipa Furnace, MD, <Dictator> Corleone Biegler Juanda Chance MD ELECTRONICALLY SIGNED 07/26/2012 21:46

## 2014-08-22 NOTE — Consult Note (Signed)
DATE OF BIRTH:  1940-08-29  DATE OF CONSULTATION:  07/15/2012  CONSULTING PHYSICIAN:  Corky Downs, MD  REPORT OF CONSULTATION:  This is a 74 year old male who was admitted into the hospital with accelerated hypertension and stroke affecting the left side of the body, with right facial weakness and right arm incoordination. He was able to walk to the car. The patient was also found to be in atrial fibrillation and elevated troponins, and need to be determined if elevated troponin is due to stroke or myocardial infarction, so CPK enzymes and troponin level will be needed. The patient unfortunately has not been taking any of his medication for the last 3 or 4 years. He continues to smoke heavily, 1 pack per day. His previous doctor told him that if he continued to smoke, then he is not going to live long, and  he continued to smoke at least 1 pack per day. Drinks about 2 beers a day. He has a history of bypass surgery, 7 vessel, at [facility]. The bypass surgery was done about 10 years ago.   PAST MEDICAL HISTORY:  CAD, atrial fibrillation, COPD, gastroesophageal reflux disease,  Dyslipidemia, hypertension.   SOCIAL HISTORY:  Smoked 1 pack per day for more than 50 years.   FAMILY HISTORY:  Father died at age 21, mother died in her 104s.   HOME MEDICATIONS:  None.   ALLERGIES:  No known allergies.   REVIEW OF SYSTEMS:  The patient has COPD with emphysema and chronic cough. There is no abdominal pain, no dysuria. No easy bruisability. No polyuria, polydipsia.   VITAL SIGNS:  Temperature is 97.6, blood pressure on arrival to the ER was 220/120, on the floor it was 144/96.   PHYSICAL EXAMINATION:  GENERAL: The patient is alert, cooperative, slightly confused. He is aphasic, with right-sided facial weakness and incoordination of the right hand. HEENT:  Head normocephalic. Pupils reactive. Tongue is moist, papillated.  NECK:  Supple. There is no bruit in the neck. No goiter.   CARDIOVASCULAR:  Apical impulse is not palpable. Both heart sounds are distant. Liver and spleen are not enlarged. No tenderness.  CHEST: Decreased breath sounds with bilateral rhonchi. There is no pedal edema.   LABORATORY DATA:  Chest x-ray revealed interstitial markings suggestive of interstitial edema. CT scan revealed subacute stroke in left middle cerebral artery territory. Carotid ultrasound does not show significant stenosis in the extracranial carotid artery. Electrocardiogram revealed atrial fibrillation with no acute EKG changes.   IMPRESSIONS:  1.  Atrial fibrillation.  2.  Acute cerebrovascular accident affecting the left middle cerebral artery territory.  3.  Subendocardial infarction. 4.  Uncontrolled hypertension. 5.  Coronary artery disease with a history of bypass surgery in the past. 6.   Chronic obstructive pulmonary disease.  7.  Severe tobacco abuse,    RECOMMENDATIONS:  I will start the patient on Pradaxa 75 mg p.o. twice a day, after we get a complete neurological evaluation. In the meantime, continue with Lovenox and aspirin. Control the blood pressure. Suggest Speech Therapy evaluation. Further evaluation of aspiration. Continue to use beta blocker to keep heart rate under control. The patient was advised to stop smoking, but I do not think he is going to do that. Continue inhaled bronchodilator.     ____________________________ Corky Downs, MD jm:mr D: 07/15/2012 18:39:18 ET T: 07/15/2012 21:38:48 ET JOB#: [id number]  cc: Corky Downs, MD, <Dictator> Corky Downs MD ELECTRONICALLY SIGNED 08/14/2012 17:42

## 2014-08-22 NOTE — H&P (Signed)
PATIENT NAME:  Ronald [name], Ronald [name] MR#:  [id number] DATE OF BIRTH:  [birth year]  DATE OF ADMISSION:  07/15/2012  PRIMARY CARE PHYSICIAN: Nonlocal REFERRING PHYSICIAN:   Lowella Fairy, MD    CHIEF COMPLAINT: Aphasia today.   HISTORY OF PRESENT ILLNESS: The patient is a 74 year old Caucasian male with a history of CAD, status post CABG, COPD, hypertension, presented to the ED with the above chief complaint. The patient is awake, alert, but cannot talk.  The patient can understand but is unable to provide information. According to the patient's daughter, the patient was fine until this morning. The patient could not talk, had a facial droop.  After he woke up, he fell on the floor but no obvious injury.  The patient can understand but cannot express himself. He denies any weakness or incontinence. No loss of consciousness, syncope or seizure. The patient was sent to the ED for further evaluation. CAT scan of the head showed infarction. The patient was treated with aspirin in the ED. Just now, troponin results come back which is at 1.7. According to the patient's daughter, the patient has been off any medication for the past 3 to 4 years. He has not seen any physician for a long time. He is still smoking 1 pack a day for more than 50 years,   PAST MEDICAL HISTORY:  CAD, afib, COPD, GERD, hyperlipidemia, hypertension, hypothyroidism.   SOCIAL HISTORY: He smokes 1 pack a day for more than 50 years, denies any alcohol drinking or illicit drugs.   FAMILY HISTORY: Father died at 48 of unknown cause. Mother died in 84s.  Sister with a heart problem and a CVA.  Mother had colon cancer.   HOME MEDICATIONS:  No.  ALLERGIES: No known drug allergies.    REVIEW OF SYSTEMS:  CONSTITUTIONAL: The patient denies any fever or chills. No headache or dizziness, no  weakness.  EYES: No double vision, blurred vision.  ENT: No epistaxis or postnasal drip. No slurred speech but has aphasia and choking.  CARDIOVASCULAR: No chest  pain, palpitation, orthopnea or nocturnal dyspnea. No leg edema.  PULMONARY: The patient has a chronic cough due to COPD and emphysema with sputum but no hemoptysis. No shortness of breath.  GASTROINTESTINAL: No abdominal pain, nausea, vomiting or diarrhea. No melena or bloody stool.  GENITOURINARY: No dysuria, hematuria or incontinence.  SKIN: No rash or jaundice.  HEMATOLOGY: No easy bruising, bleeding.  ENDOCRINOLOGY: No polyuria, polydipsia, heat or cold intolerance.  NEUROLOGY: No syncope, loss of consciousness or seizure but has aphasia, facial droop, no focal weakness.   PHYSICAL EXAMINATION: VITAL SIGNS: Temperature 97.6, blood pressure 212/120, pulse 137, respirations 26, O2 saturation 95% on room air.  GENERAL: The patient is alert, awake, oriented, but he cannot talk.  HEENT: Pupils are round, equal, reactive to light and accommodation. Moist oral mucosa.  NECK: Supple. No JVD or carotid bruits. No lymphadenopathy. No thyromegaly.  CARDIOVASCULAR: S1, S2 regular rate, tachycardia, no murmurs or gallops.  PULMONARY: Bilateral air entry and some crackles bilateral, no wheezing or rales, no use of accessory muscles to breathe.  ABDOMEN: Soft, no distention or tenderness. No organomegaly. Bowel sounds present.  EXTREMITIES: No edema, clubbing, or cyanosis. No calf tenderness. Strong bilateral pedal pulses.  SKIN: No rash or jaundice.  NEUROLOGIC: The patient is alert, awake, can understand but cannot talk.  Has a facial droop, otherwise no other focal weakness. Power 5 out of 5. Sensation intact.   LABORATORY, DIAGNOSTIC AND RADIOLOGIC DATA:  CAT scan  of head showed acute to subacute left MCA distribution infarct.  EKG shows sinus tachycardia with a first-degree AV block with a fusion complex at 125 bpm with a left anterior fascicular block, left ventricular hypertrophy with a QRS widening.   CBC normal.  PTT 39.1.  INR 1.1. CK 113, CK-MB 2.2. Glucose 92, BUN 15, creatinine 0.89,  sodium 137, potassium 4.0, chloride 103, bicarbonate 27. Troponin 1.7.   IMPRESSION:   1. Acute cerebrovascular accident.  2. Possible non ST-elevation myocardial infarction.  3. Hypertension emergency.  4. Sinus tachycardia.  5. Coronary artery disease.  6. Chronic obstructive pulmonary disease.  7. Tobacco abuse.   PLAN OF TREATMENT: 1. The patient will be admitted for tele floor. We will start O2 by nasal cannula, aspirin, Plavix. We will give Lovenox 1 mg/kg subcutaneous every 12 hours and give Lipitor.  2. We will check MRI of brain, echocardiograph, carotid duplex and a lipid panel. I discussed with Dr. Juel Burrow.  He will see the patient.  3. We will start aspiration and fall precautions, PT evaluation and speech study. We will start neuro checks, and we will get a Neurology consult from tele neurologist.  4. We will give Lopressor 5 mg IV p.r.n. if systolic blood pressure is more than 200 or diastolic more than 110. We will start lisinopril, Lopressor after swallowing and speech study.  5. For chronic obstructive pulmonary disease, which is stable now, we will give Advair and Xopenex p.r.n.   CODE STATUS: I discussed the patient's condition and the plan of treatment with the patient and the patient's daughter. According to the patient and the patient's daughter, the patient is DNR STATUS.   TIME SPENT: About 68 minutes.   ____________________________ Shaune Pollack, MD qc:cb D: 07/15/2012 14:01:16 ET T: 07/15/2012 17:28:34 ET JOB#: [id number]  cc: Shaune Pollack, MD, <Dictator> Shaune Pollack MD ELECTRONICALLY SIGNED 07/16/2012 16:20

## 2014-11-07 DIAGNOSIS — I4891 Unspecified atrial fibrillation: Secondary | ICD-10-CM | POA: Diagnosis not present

## 2014-11-07 DIAGNOSIS — I10 Essential (primary) hypertension: Secondary | ICD-10-CM | POA: Diagnosis not present

## 2014-11-07 DIAGNOSIS — G459 Transient cerebral ischemic attack, unspecified: Secondary | ICD-10-CM | POA: Diagnosis not present

## 2014-12-12 DIAGNOSIS — G459 Transient cerebral ischemic attack, unspecified: Secondary | ICD-10-CM | POA: Diagnosis not present

## 2014-12-12 DIAGNOSIS — I4891 Unspecified atrial fibrillation: Secondary | ICD-10-CM | POA: Diagnosis not present

## 2014-12-12 DIAGNOSIS — I10 Essential (primary) hypertension: Secondary | ICD-10-CM | POA: Diagnosis not present

## 2014-12-12 DIAGNOSIS — R634 Abnormal weight loss: Secondary | ICD-10-CM | POA: Diagnosis not present

## 2015-01-09 ENCOUNTER — Ambulatory Visit
Admission: RE | Admit: 2015-01-09 | Discharge: 2015-01-09 | Disposition: A | Discharge status: Home / Self Care | Payer: Medicare Other | Source: Ambulatory Visit | Attending: Cardiology | Admitting: Cardiology

## 2015-01-09 ENCOUNTER — Other Ambulatory Visit: Payer: Self-pay | Admitting: Internal Medicine

## 2015-01-09 ENCOUNTER — Ambulatory Visit
Admission: RE | Admit: 2015-01-09 | Discharge: 2015-01-09 | Disposition: A | Discharge status: Home / Self Care | Payer: Medicare Other | Source: Ambulatory Visit | Attending: Internal Medicine | Admitting: Internal Medicine

## 2015-01-09 DIAGNOSIS — J441 Chronic obstructive pulmonary disease with (acute) exacerbation: Secondary | ICD-10-CM | POA: Diagnosis not present

## 2015-01-09 DIAGNOSIS — R059 Cough, unspecified: Secondary | ICD-10-CM

## 2015-01-09 DIAGNOSIS — R05 Cough: Secondary | ICD-10-CM

## 2015-01-09 DIAGNOSIS — R0600 Dyspnea, unspecified: Secondary | ICD-10-CM | POA: Diagnosis present

## 2015-01-09 DIAGNOSIS — J449 Chronic obstructive pulmonary disease, unspecified: Secondary | ICD-10-CM | POA: Insufficient documentation

## 2015-01-09 DIAGNOSIS — I4891 Unspecified atrial fibrillation: Secondary | ICD-10-CM | POA: Diagnosis not present

## 2015-01-09 DIAGNOSIS — Z951 Presence of aortocoronary bypass graft: Secondary | ICD-10-CM | POA: Diagnosis not present

## 2015-01-09 DIAGNOSIS — R634 Abnormal weight loss: Secondary | ICD-10-CM | POA: Diagnosis not present

## 2015-01-09 DIAGNOSIS — F17200 Nicotine dependence, unspecified, uncomplicated: Secondary | ICD-10-CM | POA: Insufficient documentation

## 2015-01-09 DIAGNOSIS — E785 Hyperlipidemia, unspecified: Secondary | ICD-10-CM | POA: Diagnosis not present

## 2015-01-16 DIAGNOSIS — E079 Disorder of thyroid, unspecified: Secondary | ICD-10-CM | POA: Diagnosis not present

## 2015-01-16 DIAGNOSIS — I4891 Unspecified atrial fibrillation: Secondary | ICD-10-CM | POA: Diagnosis not present

## 2015-01-16 DIAGNOSIS — I10 Essential (primary) hypertension: Secondary | ICD-10-CM | POA: Diagnosis not present

## 2015-01-19 DIAGNOSIS — Z23 Encounter for immunization: Secondary | ICD-10-CM | POA: Diagnosis not present

## 2015-02-17 DIAGNOSIS — I4891 Unspecified atrial fibrillation: Secondary | ICD-10-CM | POA: Diagnosis not present

## 2015-02-17 DIAGNOSIS — E039 Hypothyroidism, unspecified: Secondary | ICD-10-CM | POA: Diagnosis not present

## 2015-02-17 DIAGNOSIS — G459 Transient cerebral ischemic attack, unspecified: Secondary | ICD-10-CM | POA: Diagnosis not present

## 2015-02-17 DIAGNOSIS — E785 Hyperlipidemia, unspecified: Secondary | ICD-10-CM | POA: Diagnosis not present

## 2015-03-20 DIAGNOSIS — G459 Transient cerebral ischemic attack, unspecified: Secondary | ICD-10-CM | POA: Diagnosis not present

## 2015-03-20 DIAGNOSIS — E039 Hypothyroidism, unspecified: Secondary | ICD-10-CM | POA: Diagnosis not present

## 2015-03-20 DIAGNOSIS — I4891 Unspecified atrial fibrillation: Secondary | ICD-10-CM | POA: Diagnosis not present

## 2015-03-20 DIAGNOSIS — R634 Abnormal weight loss: Secondary | ICD-10-CM | POA: Diagnosis not present

## 2015-06-19 DIAGNOSIS — G459 Transient cerebral ischemic attack, unspecified: Secondary | ICD-10-CM | POA: Diagnosis not present

## 2015-06-19 DIAGNOSIS — I4892 Unspecified atrial flutter: Secondary | ICD-10-CM | POA: Diagnosis not present

## 2015-06-19 DIAGNOSIS — I10 Essential (primary) hypertension: Secondary | ICD-10-CM | POA: Diagnosis not present

## 2015-06-19 DIAGNOSIS — E784 Other hyperlipidemia: Secondary | ICD-10-CM | POA: Diagnosis not present

## 2015-06-22 DIAGNOSIS — G459 Transient cerebral ischemic attack, unspecified: Secondary | ICD-10-CM | POA: Diagnosis not present

## 2015-06-22 DIAGNOSIS — E039 Hypothyroidism, unspecified: Secondary | ICD-10-CM | POA: Diagnosis not present

## 2015-06-22 DIAGNOSIS — I4891 Unspecified atrial fibrillation: Secondary | ICD-10-CM | POA: Diagnosis not present

## 2015-06-22 DIAGNOSIS — E785 Hyperlipidemia, unspecified: Secondary | ICD-10-CM | POA: Diagnosis not present

## 2015-07-21 DIAGNOSIS — R040 Epistaxis: Secondary | ICD-10-CM | POA: Diagnosis not present

## 2015-07-21 DIAGNOSIS — I4891 Unspecified atrial fibrillation: Secondary | ICD-10-CM | POA: Diagnosis not present

## 2015-07-21 DIAGNOSIS — G459 Transient cerebral ischemic attack, unspecified: Secondary | ICD-10-CM | POA: Diagnosis not present

## 2015-07-21 DIAGNOSIS — E039 Hypothyroidism, unspecified: Secondary | ICD-10-CM | POA: Diagnosis not present

## 2015-08-04 DIAGNOSIS — I4891 Unspecified atrial fibrillation: Secondary | ICD-10-CM | POA: Diagnosis not present

## 2015-08-04 DIAGNOSIS — E039 Hypothyroidism, unspecified: Secondary | ICD-10-CM | POA: Diagnosis not present

## 2015-08-04 DIAGNOSIS — E785 Hyperlipidemia, unspecified: Secondary | ICD-10-CM | POA: Diagnosis not present

## 2015-08-04 DIAGNOSIS — G459 Transient cerebral ischemic attack, unspecified: Secondary | ICD-10-CM | POA: Diagnosis not present

## 2015-09-01 DIAGNOSIS — I4891 Unspecified atrial fibrillation: Secondary | ICD-10-CM | POA: Diagnosis not present

## 2015-09-01 DIAGNOSIS — E039 Hypothyroidism, unspecified: Secondary | ICD-10-CM | POA: Diagnosis not present

## 2015-09-01 DIAGNOSIS — R634 Abnormal weight loss: Secondary | ICD-10-CM | POA: Diagnosis not present

## 2015-10-02 DIAGNOSIS — I4891 Unspecified atrial fibrillation: Secondary | ICD-10-CM | POA: Diagnosis not present

## 2015-10-02 DIAGNOSIS — G459 Transient cerebral ischemic attack, unspecified: Secondary | ICD-10-CM | POA: Diagnosis not present

## 2015-10-02 DIAGNOSIS — E039 Hypothyroidism, unspecified: Secondary | ICD-10-CM | POA: Diagnosis not present

## 2015-10-02 DIAGNOSIS — I10 Essential (primary) hypertension: Secondary | ICD-10-CM | POA: Diagnosis not present

## 2015-11-09 DIAGNOSIS — G459 Transient cerebral ischemic attack, unspecified: Secondary | ICD-10-CM | POA: Diagnosis not present

## 2015-11-09 DIAGNOSIS — E039 Hypothyroidism, unspecified: Secondary | ICD-10-CM | POA: Diagnosis not present

## 2015-11-09 DIAGNOSIS — I4891 Unspecified atrial fibrillation: Secondary | ICD-10-CM | POA: Diagnosis not present

## 2015-11-09 DIAGNOSIS — E785 Hyperlipidemia, unspecified: Secondary | ICD-10-CM | POA: Diagnosis not present

## 2015-12-07 DIAGNOSIS — E039 Hypothyroidism, unspecified: Secondary | ICD-10-CM | POA: Diagnosis not present

## 2015-12-07 DIAGNOSIS — I4891 Unspecified atrial fibrillation: Secondary | ICD-10-CM | POA: Diagnosis not present

## 2015-12-07 DIAGNOSIS — E785 Hyperlipidemia, unspecified: Secondary | ICD-10-CM | POA: Diagnosis not present

## 2015-12-07 DIAGNOSIS — G459 Transient cerebral ischemic attack, unspecified: Secondary | ICD-10-CM | POA: Diagnosis not present

## 2016-01-05 DIAGNOSIS — I4891 Unspecified atrial fibrillation: Secondary | ICD-10-CM | POA: Diagnosis not present

## 2016-01-05 DIAGNOSIS — G459 Transient cerebral ischemic attack, unspecified: Secondary | ICD-10-CM | POA: Diagnosis not present

## 2016-01-05 DIAGNOSIS — E039 Hypothyroidism, unspecified: Secondary | ICD-10-CM | POA: Diagnosis not present

## 2016-01-05 DIAGNOSIS — I10 Essential (primary) hypertension: Secondary | ICD-10-CM | POA: Diagnosis not present

## 2016-02-02 DIAGNOSIS — E039 Hypothyroidism, unspecified: Secondary | ICD-10-CM | POA: Diagnosis not present

## 2016-02-02 DIAGNOSIS — I4891 Unspecified atrial fibrillation: Secondary | ICD-10-CM | POA: Diagnosis not present

## 2016-02-02 DIAGNOSIS — I10 Essential (primary) hypertension: Secondary | ICD-10-CM | POA: Diagnosis not present

## 2016-02-02 DIAGNOSIS — G459 Transient cerebral ischemic attack, unspecified: Secondary | ICD-10-CM | POA: Diagnosis not present

## 2016-02-08 DIAGNOSIS — I4891 Unspecified atrial fibrillation: Secondary | ICD-10-CM | POA: Diagnosis not present

## 2016-02-08 DIAGNOSIS — E785 Hyperlipidemia, unspecified: Secondary | ICD-10-CM | POA: Diagnosis not present

## 2016-02-08 DIAGNOSIS — G459 Transient cerebral ischemic attack, unspecified: Secondary | ICD-10-CM | POA: Diagnosis not present

## 2016-02-08 DIAGNOSIS — R634 Abnormal weight loss: Secondary | ICD-10-CM | POA: Diagnosis not present

## 2016-03-04 ENCOUNTER — Other Ambulatory Visit: Payer: Self-pay | Admitting: Internal Medicine

## 2016-03-04 DIAGNOSIS — E119 Type 2 diabetes mellitus without complications: Secondary | ICD-10-CM | POA: Diagnosis not present

## 2016-03-04 DIAGNOSIS — I4891 Unspecified atrial fibrillation: Secondary | ICD-10-CM | POA: Diagnosis not present

## 2016-03-04 DIAGNOSIS — E784 Other hyperlipidemia: Secondary | ICD-10-CM | POA: Diagnosis not present

## 2016-03-04 DIAGNOSIS — R5381 Other malaise: Secondary | ICD-10-CM | POA: Diagnosis not present

## 2016-03-04 DIAGNOSIS — I10 Essential (primary) hypertension: Secondary | ICD-10-CM | POA: Diagnosis not present

## 2016-03-04 DIAGNOSIS — Z0000 Encounter for general adult medical examination without abnormal findings: Secondary | ICD-10-CM | POA: Diagnosis not present

## 2016-03-04 DIAGNOSIS — J449 Chronic obstructive pulmonary disease, unspecified: Secondary | ICD-10-CM

## 2016-03-04 DIAGNOSIS — R634 Abnormal weight loss: Secondary | ICD-10-CM

## 2016-03-07 ENCOUNTER — Ambulatory Visit
Admission: RE | Admit: 2016-03-07 | Discharge: 2016-03-07 | Disposition: A | Discharge status: Home / Self Care | Payer: Medicare Other | Source: Ambulatory Visit | Attending: Internal Medicine | Admitting: Internal Medicine

## 2016-03-07 DIAGNOSIS — M5134 Other intervertebral disc degeneration, thoracic region: Secondary | ICD-10-CM | POA: Insufficient documentation

## 2016-03-07 DIAGNOSIS — J449 Chronic obstructive pulmonary disease, unspecified: Secondary | ICD-10-CM | POA: Diagnosis not present

## 2016-03-07 DIAGNOSIS — I700 Atherosclerosis of aorta: Secondary | ICD-10-CM | POA: Insufficient documentation

## 2016-03-07 DIAGNOSIS — I517 Cardiomegaly: Secondary | ICD-10-CM | POA: Insufficient documentation

## 2016-03-07 DIAGNOSIS — R634 Abnormal weight loss: Secondary | ICD-10-CM

## 2016-03-07 DIAGNOSIS — M546 Pain in thoracic spine: Secondary | ICD-10-CM | POA: Diagnosis not present

## 2016-03-07 DIAGNOSIS — Z951 Presence of aortocoronary bypass graft: Secondary | ICD-10-CM | POA: Insufficient documentation

## 2016-04-04 DIAGNOSIS — E039 Hypothyroidism, unspecified: Secondary | ICD-10-CM | POA: Diagnosis not present

## 2016-04-04 DIAGNOSIS — E079 Disorder of thyroid, unspecified: Secondary | ICD-10-CM | POA: Diagnosis not present

## 2016-04-04 DIAGNOSIS — I10 Essential (primary) hypertension: Secondary | ICD-10-CM | POA: Diagnosis not present

## 2016-04-04 DIAGNOSIS — I4891 Unspecified atrial fibrillation: Secondary | ICD-10-CM | POA: Diagnosis not present

## 2016-05-05 DIAGNOSIS — I10 Essential (primary) hypertension: Secondary | ICD-10-CM | POA: Diagnosis not present

## 2016-05-05 DIAGNOSIS — I4891 Unspecified atrial fibrillation: Secondary | ICD-10-CM | POA: Diagnosis not present

## 2016-05-05 DIAGNOSIS — E039 Hypothyroidism, unspecified: Secondary | ICD-10-CM | POA: Diagnosis not present

## 2016-05-05 DIAGNOSIS — E079 Disorder of thyroid, unspecified: Secondary | ICD-10-CM | POA: Diagnosis not present

## 2016-06-06 DIAGNOSIS — I4891 Unspecified atrial fibrillation: Secondary | ICD-10-CM | POA: Diagnosis not present

## 2016-06-06 DIAGNOSIS — I10 Essential (primary) hypertension: Secondary | ICD-10-CM | POA: Diagnosis not present

## 2016-06-06 DIAGNOSIS — E039 Hypothyroidism, unspecified: Secondary | ICD-10-CM | POA: Diagnosis not present

## 2016-06-06 DIAGNOSIS — E079 Disorder of thyroid, unspecified: Secondary | ICD-10-CM | POA: Diagnosis not present

## 2016-06-06 DIAGNOSIS — R634 Abnormal weight loss: Secondary | ICD-10-CM | POA: Diagnosis not present

## 2016-08-02 DIAGNOSIS — I10 Essential (primary) hypertension: Secondary | ICD-10-CM | POA: Diagnosis not present

## 2016-08-02 DIAGNOSIS — E785 Hyperlipidemia, unspecified: Secondary | ICD-10-CM | POA: Diagnosis not present

## 2016-08-02 DIAGNOSIS — E039 Hypothyroidism, unspecified: Secondary | ICD-10-CM | POA: Diagnosis not present

## 2016-08-02 DIAGNOSIS — I4891 Unspecified atrial fibrillation: Secondary | ICD-10-CM | POA: Diagnosis not present

## 2016-08-31 DIAGNOSIS — E785 Hyperlipidemia, unspecified: Secondary | ICD-10-CM | POA: Diagnosis not present

## 2016-08-31 DIAGNOSIS — E079 Disorder of thyroid, unspecified: Secondary | ICD-10-CM | POA: Diagnosis not present

## 2016-08-31 DIAGNOSIS — R634 Abnormal weight loss: Secondary | ICD-10-CM | POA: Diagnosis not present

## 2016-08-31 DIAGNOSIS — I4891 Unspecified atrial fibrillation: Secondary | ICD-10-CM | POA: Diagnosis not present

## 2016-10-04 DIAGNOSIS — E785 Hyperlipidemia, unspecified: Secondary | ICD-10-CM | POA: Diagnosis not present

## 2016-10-04 DIAGNOSIS — I4891 Unspecified atrial fibrillation: Secondary | ICD-10-CM | POA: Diagnosis not present

## 2016-10-04 DIAGNOSIS — E079 Disorder of thyroid, unspecified: Secondary | ICD-10-CM | POA: Diagnosis not present

## 2016-10-04 DIAGNOSIS — R634 Abnormal weight loss: Secondary | ICD-10-CM | POA: Diagnosis not present

## 2016-11-01 DIAGNOSIS — I10 Essential (primary) hypertension: Secondary | ICD-10-CM | POA: Diagnosis not present

## 2016-11-01 DIAGNOSIS — E079 Disorder of thyroid, unspecified: Secondary | ICD-10-CM | POA: Diagnosis not present

## 2016-11-01 DIAGNOSIS — R634 Abnormal weight loss: Secondary | ICD-10-CM | POA: Diagnosis not present

## 2016-11-01 DIAGNOSIS — I4891 Unspecified atrial fibrillation: Secondary | ICD-10-CM | POA: Diagnosis not present

## 2016-11-15 DIAGNOSIS — I4891 Unspecified atrial fibrillation: Secondary | ICD-10-CM | POA: Diagnosis not present

## 2016-11-15 DIAGNOSIS — I10 Essential (primary) hypertension: Secondary | ICD-10-CM | POA: Diagnosis not present

## 2016-11-15 DIAGNOSIS — E039 Hypothyroidism, unspecified: Secondary | ICD-10-CM | POA: Diagnosis not present

## 2016-11-15 DIAGNOSIS — E785 Hyperlipidemia, unspecified: Secondary | ICD-10-CM | POA: Diagnosis not present

## 2016-11-25 DIAGNOSIS — E119 Type 2 diabetes mellitus without complications: Secondary | ICD-10-CM | POA: Diagnosis not present

## 2016-11-25 DIAGNOSIS — I4891 Unspecified atrial fibrillation: Secondary | ICD-10-CM | POA: Diagnosis not present

## 2016-11-25 DIAGNOSIS — E039 Hypothyroidism, unspecified: Secondary | ICD-10-CM | POA: Diagnosis not present

## 2016-11-25 DIAGNOSIS — E785 Hyperlipidemia, unspecified: Secondary | ICD-10-CM | POA: Diagnosis not present

## 2016-11-25 DIAGNOSIS — I10 Essential (primary) hypertension: Secondary | ICD-10-CM | POA: Diagnosis not present

## 2016-11-25 DIAGNOSIS — I4892 Unspecified atrial flutter: Secondary | ICD-10-CM | POA: Diagnosis not present

## 2016-11-25 DIAGNOSIS — R55 Syncope and collapse: Secondary | ICD-10-CM | POA: Diagnosis not present

## 2016-11-25 DIAGNOSIS — E784 Other hyperlipidemia: Secondary | ICD-10-CM | POA: Diagnosis not present

## 2016-11-25 DIAGNOSIS — R5381 Other malaise: Secondary | ICD-10-CM | POA: Diagnosis not present

## 2016-11-29 DIAGNOSIS — G459 Transient cerebral ischemic attack, unspecified: Secondary | ICD-10-CM | POA: Diagnosis not present

## 2016-11-29 DIAGNOSIS — E079 Disorder of thyroid, unspecified: Secondary | ICD-10-CM | POA: Diagnosis not present

## 2016-11-29 DIAGNOSIS — E785 Hyperlipidemia, unspecified: Secondary | ICD-10-CM | POA: Diagnosis not present

## 2016-11-29 DIAGNOSIS — R634 Abnormal weight loss: Secondary | ICD-10-CM | POA: Diagnosis not present

## 2016-12-05 DIAGNOSIS — I4891 Unspecified atrial fibrillation: Secondary | ICD-10-CM | POA: Diagnosis not present

## 2016-12-05 DIAGNOSIS — R55 Syncope and collapse: Secondary | ICD-10-CM | POA: Diagnosis not present

## 2016-12-05 DIAGNOSIS — I10 Essential (primary) hypertension: Secondary | ICD-10-CM | POA: Diagnosis not present

## 2016-12-05 DIAGNOSIS — E039 Hypothyroidism, unspecified: Secondary | ICD-10-CM | POA: Diagnosis not present

## 2016-12-19 DIAGNOSIS — E785 Hyperlipidemia, unspecified: Secondary | ICD-10-CM | POA: Diagnosis not present

## 2016-12-19 DIAGNOSIS — G459 Transient cerebral ischemic attack, unspecified: Secondary | ICD-10-CM | POA: Diagnosis not present

## 2016-12-19 DIAGNOSIS — R634 Abnormal weight loss: Secondary | ICD-10-CM | POA: Diagnosis not present

## 2016-12-19 DIAGNOSIS — E079 Disorder of thyroid, unspecified: Secondary | ICD-10-CM | POA: Diagnosis not present

## 2017-01-16 DIAGNOSIS — E039 Hypothyroidism, unspecified: Secondary | ICD-10-CM | POA: Diagnosis not present

## 2017-01-16 DIAGNOSIS — I10 Essential (primary) hypertension: Secondary | ICD-10-CM | POA: Diagnosis not present

## 2017-01-16 DIAGNOSIS — G459 Transient cerebral ischemic attack, unspecified: Secondary | ICD-10-CM | POA: Diagnosis not present

## 2017-01-16 DIAGNOSIS — I4891 Unspecified atrial fibrillation: Secondary | ICD-10-CM | POA: Diagnosis not present

## 2017-02-13 DIAGNOSIS — I4891 Unspecified atrial fibrillation: Secondary | ICD-10-CM | POA: Diagnosis not present

## 2017-02-13 DIAGNOSIS — G459 Transient cerebral ischemic attack, unspecified: Secondary | ICD-10-CM | POA: Diagnosis not present

## 2017-02-13 DIAGNOSIS — E039 Hypothyroidism, unspecified: Secondary | ICD-10-CM | POA: Diagnosis not present

## 2017-02-13 DIAGNOSIS — I10 Essential (primary) hypertension: Secondary | ICD-10-CM | POA: Diagnosis not present

## 2017-03-13 DIAGNOSIS — I10 Essential (primary) hypertension: Secondary | ICD-10-CM | POA: Diagnosis not present

## 2017-03-13 DIAGNOSIS — I4891 Unspecified atrial fibrillation: Secondary | ICD-10-CM | POA: Diagnosis not present

## 2017-03-13 DIAGNOSIS — E039 Hypothyroidism, unspecified: Secondary | ICD-10-CM | POA: Diagnosis not present

## 2017-03-13 DIAGNOSIS — E785 Hyperlipidemia, unspecified: Secondary | ICD-10-CM | POA: Diagnosis not present

## 2017-04-18 DIAGNOSIS — I4891 Unspecified atrial fibrillation: Secondary | ICD-10-CM | POA: Diagnosis not present

## 2017-04-18 DIAGNOSIS — E079 Disorder of thyroid, unspecified: Secondary | ICD-10-CM | POA: Diagnosis not present

## 2017-04-18 DIAGNOSIS — G459 Transient cerebral ischemic attack, unspecified: Secondary | ICD-10-CM | POA: Diagnosis not present

## 2017-04-18 DIAGNOSIS — R634 Abnormal weight loss: Secondary | ICD-10-CM | POA: Diagnosis not present

## 2017-05-19 DIAGNOSIS — E079 Disorder of thyroid, unspecified: Secondary | ICD-10-CM | POA: Diagnosis not present

## 2017-05-19 DIAGNOSIS — G459 Transient cerebral ischemic attack, unspecified: Secondary | ICD-10-CM | POA: Diagnosis not present

## 2017-05-19 DIAGNOSIS — I4891 Unspecified atrial fibrillation: Secondary | ICD-10-CM | POA: Diagnosis not present

## 2017-05-19 DIAGNOSIS — E785 Hyperlipidemia, unspecified: Secondary | ICD-10-CM | POA: Diagnosis not present

## 2017-05-19 DIAGNOSIS — R634 Abnormal weight loss: Secondary | ICD-10-CM | POA: Diagnosis not present

## 2017-05-25 DIAGNOSIS — I4891 Unspecified atrial fibrillation: Secondary | ICD-10-CM | POA: Diagnosis not present

## 2017-05-25 DIAGNOSIS — G459 Transient cerebral ischemic attack, unspecified: Secondary | ICD-10-CM | POA: Diagnosis not present

## 2017-05-25 DIAGNOSIS — E785 Hyperlipidemia, unspecified: Secondary | ICD-10-CM | POA: Diagnosis not present

## 2017-05-25 DIAGNOSIS — R634 Abnormal weight loss: Secondary | ICD-10-CM | POA: Diagnosis not present

## 2017-06-19 ENCOUNTER — Other Ambulatory Visit: Payer: Self-pay

## 2017-06-19 ENCOUNTER — Emergency Department (HOSPITAL_COMMUNITY): Payer: Medicare Other

## 2017-06-19 ENCOUNTER — Encounter (HOSPITAL_COMMUNITY)
Admission: EM | Disposition: A | Discharge status: Rehabilitation Facility | Payer: Self-pay | Source: Home / Self Care | Attending: Neurology

## 2017-06-19 ENCOUNTER — Inpatient Hospital Stay (HOSPITAL_COMMUNITY): Payer: Medicare Other

## 2017-06-19 ENCOUNTER — Inpatient Hospital Stay (HOSPITAL_COMMUNITY): Payer: Medicare Other | Admitting: Certified Registered Nurse Anesthetist

## 2017-06-19 ENCOUNTER — Encounter (HOSPITAL_COMMUNITY): Payer: Self-pay

## 2017-06-19 ENCOUNTER — Inpatient Hospital Stay (HOSPITAL_COMMUNITY)
Admission: EM | Admit: 2017-06-19 | Discharge: 2017-06-23 | DRG: 061 | Disposition: A | Discharge status: Rehabilitation Facility | Payer: Medicare Other | Attending: Neurology | Admitting: Neurology

## 2017-06-19 DIAGNOSIS — I619 Nontraumatic intracerebral hemorrhage, unspecified: Secondary | ICD-10-CM | POA: Diagnosis present

## 2017-06-19 DIAGNOSIS — I083 Combined rheumatic disorders of mitral, aortic and tricuspid valves: Secondary | ICD-10-CM | POA: Diagnosis present

## 2017-06-19 DIAGNOSIS — R402242 Coma scale, best verbal response, confused conversation, at arrival to emergency department: Secondary | ICD-10-CM | POA: Diagnosis present

## 2017-06-19 DIAGNOSIS — J9690 Respiratory failure, unspecified, unspecified whether with hypoxia or hypercapnia: Secondary | ICD-10-CM | POA: Diagnosis not present

## 2017-06-19 DIAGNOSIS — R634 Abnormal weight loss: Secondary | ICD-10-CM | POA: Diagnosis not present

## 2017-06-19 DIAGNOSIS — I2510 Atherosclerotic heart disease of native coronary artery without angina pectoris: Secondary | ICD-10-CM | POA: Diagnosis present

## 2017-06-19 DIAGNOSIS — I69354 Hemiplegia and hemiparesis following cerebral infarction affecting left non-dominant side: Secondary | ICD-10-CM | POA: Diagnosis not present

## 2017-06-19 DIAGNOSIS — I63411 Cerebral infarction due to embolism of right middle cerebral artery: Principal | ICD-10-CM | POA: Diagnosis present

## 2017-06-19 DIAGNOSIS — J9601 Acute respiratory failure with hypoxia: Secondary | ICD-10-CM | POA: Diagnosis not present

## 2017-06-19 DIAGNOSIS — I482 Chronic atrial fibrillation: Secondary | ICD-10-CM | POA: Diagnosis not present

## 2017-06-19 DIAGNOSIS — I6501 Occlusion and stenosis of right vertebral artery: Secondary | ICD-10-CM | POA: Diagnosis not present

## 2017-06-19 DIAGNOSIS — G8194 Hemiplegia, unspecified affecting left nondominant side: Secondary | ICD-10-CM | POA: Diagnosis present

## 2017-06-19 DIAGNOSIS — Z4682 Encounter for fitting and adjustment of non-vascular catheter: Secondary | ICD-10-CM | POA: Diagnosis not present

## 2017-06-19 DIAGNOSIS — I672 Cerebral atherosclerosis: Secondary | ICD-10-CM | POA: Diagnosis present

## 2017-06-19 DIAGNOSIS — I63431 Cerebral infarction due to embolism of right posterior cerebral artery: Secondary | ICD-10-CM | POA: Diagnosis present

## 2017-06-19 DIAGNOSIS — I639 Cerebral infarction, unspecified: Secondary | ICD-10-CM | POA: Diagnosis present

## 2017-06-19 DIAGNOSIS — R339 Retention of urine, unspecified: Secondary | ICD-10-CM | POA: Diagnosis present

## 2017-06-19 DIAGNOSIS — I169 Hypertensive crisis, unspecified: Secondary | ICD-10-CM | POA: Diagnosis not present

## 2017-06-19 DIAGNOSIS — Z931 Gastrostomy status: Secondary | ICD-10-CM | POA: Diagnosis not present

## 2017-06-19 DIAGNOSIS — D696 Thrombocytopenia, unspecified: Secondary | ICD-10-CM | POA: Diagnosis present

## 2017-06-19 DIAGNOSIS — R1310 Dysphagia, unspecified: Secondary | ICD-10-CM | POA: Diagnosis present

## 2017-06-19 DIAGNOSIS — E871 Hypo-osmolality and hyponatremia: Secondary | ICD-10-CM | POA: Diagnosis present

## 2017-06-19 DIAGNOSIS — E039 Hypothyroidism, unspecified: Secondary | ICD-10-CM | POA: Diagnosis present

## 2017-06-19 DIAGNOSIS — I63311 Cerebral infarction due to thrombosis of right middle cerebral artery: Secondary | ICD-10-CM | POA: Diagnosis not present

## 2017-06-19 DIAGNOSIS — F17210 Nicotine dependence, cigarettes, uncomplicated: Secondary | ICD-10-CM | POA: Diagnosis present

## 2017-06-19 DIAGNOSIS — I10 Essential (primary) hypertension: Secondary | ICD-10-CM | POA: Diagnosis present

## 2017-06-19 DIAGNOSIS — Z79890 Hormone replacement therapy (postmenopausal): Secondary | ICD-10-CM | POA: Diagnosis not present

## 2017-06-19 DIAGNOSIS — I69320 Aphasia following cerebral infarction: Secondary | ICD-10-CM

## 2017-06-19 DIAGNOSIS — R1312 Dysphagia, oropharyngeal phase: Secondary | ICD-10-CM | POA: Diagnosis not present

## 2017-06-19 DIAGNOSIS — R402122 Coma scale, eyes open, to pain, at arrival to emergency department: Secondary | ICD-10-CM | POA: Diagnosis present

## 2017-06-19 DIAGNOSIS — R471 Dysarthria and anarthria: Secondary | ICD-10-CM | POA: Diagnosis present

## 2017-06-19 DIAGNOSIS — I351 Nonrheumatic aortic (valve) insufficiency: Secondary | ICD-10-CM | POA: Diagnosis not present

## 2017-06-19 DIAGNOSIS — I651 Occlusion and stenosis of basilar artery: Secondary | ICD-10-CM | POA: Diagnosis not present

## 2017-06-19 DIAGNOSIS — G9340 Encephalopathy, unspecified: Secondary | ICD-10-CM | POA: Diagnosis present

## 2017-06-19 DIAGNOSIS — I6601 Occlusion and stenosis of right middle cerebral artery: Secondary | ICD-10-CM | POA: Diagnosis not present

## 2017-06-19 DIAGNOSIS — E876 Hypokalemia: Secondary | ICD-10-CM | POA: Diagnosis present

## 2017-06-19 DIAGNOSIS — Z7901 Long term (current) use of anticoagulants: Secondary | ICD-10-CM

## 2017-06-19 DIAGNOSIS — G459 Transient cerebral ischemic attack, unspecified: Secondary | ICD-10-CM | POA: Diagnosis not present

## 2017-06-19 DIAGNOSIS — I6300 Cerebral infarction due to thrombosis of unspecified precerebral artery: Secondary | ICD-10-CM | POA: Diagnosis not present

## 2017-06-19 DIAGNOSIS — J690 Pneumonitis due to inhalation of food and vomit: Secondary | ICD-10-CM | POA: Diagnosis present

## 2017-06-19 DIAGNOSIS — I6389 Other cerebral infarction: Secondary | ICD-10-CM | POA: Diagnosis not present

## 2017-06-19 DIAGNOSIS — I4891 Unspecified atrial fibrillation: Secondary | ICD-10-CM | POA: Diagnosis not present

## 2017-06-19 DIAGNOSIS — M25511 Pain in right shoulder: Secondary | ICD-10-CM | POA: Diagnosis not present

## 2017-06-19 DIAGNOSIS — J439 Emphysema, unspecified: Secondary | ICD-10-CM | POA: Diagnosis present

## 2017-06-19 DIAGNOSIS — I469 Cardiac arrest, cause unspecified: Secondary | ICD-10-CM | POA: Diagnosis not present

## 2017-06-19 DIAGNOSIS — R29718 NIHSS score 18: Secondary | ICD-10-CM | POA: Diagnosis present

## 2017-06-19 DIAGNOSIS — R29818 Other symptoms and signs involving the nervous system: Secondary | ICD-10-CM | POA: Diagnosis not present

## 2017-06-19 DIAGNOSIS — R609 Edema, unspecified: Secondary | ICD-10-CM | POA: Diagnosis not present

## 2017-06-19 DIAGNOSIS — I611 Nontraumatic intracerebral hemorrhage in hemisphere, cortical: Secondary | ICD-10-CM | POA: Diagnosis not present

## 2017-06-19 DIAGNOSIS — J9600 Acute respiratory failure, unspecified whether with hypoxia or hypercapnia: Secondary | ICD-10-CM | POA: Diagnosis present

## 2017-06-19 DIAGNOSIS — E785 Hyperlipidemia, unspecified: Secondary | ICD-10-CM | POA: Diagnosis present

## 2017-06-19 DIAGNOSIS — I6611 Occlusion and stenosis of right anterior cerebral artery: Secondary | ICD-10-CM | POA: Diagnosis not present

## 2017-06-19 DIAGNOSIS — R4182 Altered mental status, unspecified: Secondary | ICD-10-CM | POA: Diagnosis not present

## 2017-06-19 DIAGNOSIS — I69322 Dysarthria following cerebral infarction: Secondary | ICD-10-CM | POA: Diagnosis not present

## 2017-06-19 DIAGNOSIS — Z978 Presence of other specified devices: Secondary | ICD-10-CM

## 2017-06-19 DIAGNOSIS — R531 Weakness: Secondary | ICD-10-CM | POA: Diagnosis not present

## 2017-06-19 DIAGNOSIS — R29810 Facial weakness: Secondary | ICD-10-CM | POA: Diagnosis not present

## 2017-06-19 DIAGNOSIS — I63511 Cerebral infarction due to unspecified occlusion or stenosis of right middle cerebral artery: Secondary | ICD-10-CM | POA: Diagnosis not present

## 2017-06-19 DIAGNOSIS — R402352 Coma scale, best motor response, localizes pain, at arrival to emergency department: Secondary | ICD-10-CM | POA: Diagnosis present

## 2017-06-19 DIAGNOSIS — Z515 Encounter for palliative care: Secondary | ICD-10-CM

## 2017-06-19 DIAGNOSIS — F1010 Alcohol abuse, uncomplicated: Secondary | ICD-10-CM | POA: Diagnosis present

## 2017-06-19 DIAGNOSIS — J9811 Atelectasis: Secondary | ICD-10-CM | POA: Diagnosis not present

## 2017-06-19 HISTORY — DX: Disorder of thyroid, unspecified: E07.9

## 2017-06-19 HISTORY — PX: RADIOLOGY WITH ANESTHESIA: SHX6223

## 2017-06-19 HISTORY — DX: Cerebral infarction, unspecified: I63.9

## 2017-06-19 HISTORY — DX: Chronic obstructive pulmonary disease, unspecified: J44.9

## 2017-06-19 HISTORY — DX: Essential (primary) hypertension: I10

## 2017-06-19 HISTORY — PX: IR ANGIO VERTEBRAL SEL SUBCLAVIAN INNOMINATE UNI R MOD SED: IMG5365

## 2017-06-19 HISTORY — DX: Emphysema, unspecified: J43.9

## 2017-06-19 HISTORY — DX: Personal history of other drug therapy: Z92.29

## 2017-06-19 HISTORY — PX: IR ANGIO EXTRACRAN SEL COM CAROTID INNOMINATE UNI R MOD SED: IMG5356

## 2017-06-19 HISTORY — DX: Long term (current) use of anticoagulants: Z79.01

## 2017-06-19 LAB — DIFFERENTIAL
BASOS ABS: 0 10*3/uL (ref 0.0–0.1)
BASOS PCT: 1 %
Eosinophils Absolute: 0.6 10*3/uL (ref 0.0–0.7)
Eosinophils Relative: 11 %
Lymphocytes Relative: 23 %
Lymphs Abs: 1.3 10*3/uL (ref 0.7–4.0)
MONO ABS: 0.3 10*3/uL (ref 0.1–1.0)
Monocytes Relative: 5 %
NEUTROS PCT: 60 %
Neutro Abs: 3.5 10*3/uL (ref 1.7–7.7)

## 2017-06-19 LAB — COMPREHENSIVE METABOLIC PANEL
ALBUMIN: 3.8 g/dL (ref 3.5–5.0)
ALT: 19 U/L (ref 17–63)
AST: 30 U/L (ref 15–41)
Alkaline Phosphatase: 43 U/L (ref 38–126)
Anion gap: 11 (ref 5–15)
BUN: 10 mg/dL (ref 6–20)
CHLORIDE: 100 mmol/L — AB (ref 101–111)
CO2: 24 mmol/L (ref 22–32)
CREATININE: 0.8 mg/dL (ref 0.61–1.24)
Calcium: 8.9 mg/dL (ref 8.9–10.3)
GFR calc non Af Amer: >60 mL/min (ref >60)
Glucose, Bld: 119 mg/dL — ABNORMAL HIGH (ref 65–99)
Potassium: 3.4 mmol/L — ABNORMAL LOW (ref 3.5–5.1)
SODIUM: 135 mmol/L (ref 135–145)
Total Bilirubin: 0.6 mg/dL (ref 0.3–1.2)
Total Protein: 6.3 g/dL — ABNORMAL LOW (ref 6.5–8.1)

## 2017-06-19 LAB — CBC
HCT: 40.3 % (ref 39.0–52.0)
Hemoglobin: 14.4 g/dL (ref 13.0–17.0)
MCH: 35 pg — ABNORMAL HIGH (ref 26.0–34.0)
MCHC: 35.7 g/dL (ref 30.0–36.0)
MCV: 97.8 fL (ref 78.0–100.0)
PLATELETS: 146 10*3/uL — AB (ref 150–400)
RBC: 4.12 MIL/uL — ABNORMAL LOW (ref 4.22–5.81)
RDW: 13.3 % (ref 11.5–15.5)
WBC: 5.8 10*3/uL (ref 4.0–10.5)

## 2017-06-19 LAB — I-STAT CHEM 8, ED
BUN: 11 mg/dL (ref 6–20)
CHLORIDE: 98 mmol/L — AB (ref 101–111)
Calcium, Ion: 1.12 mmol/L — ABNORMAL LOW (ref 1.15–1.40)
Creatinine, Ser: 0.9 mg/dL (ref 0.61–1.24)
Glucose, Bld: 111 mg/dL — ABNORMAL HIGH (ref 65–99)
HEMATOCRIT: 43 % (ref 39.0–52.0)
Hemoglobin: 14.6 g/dL (ref 13.0–17.0)
POTASSIUM: 3.4 mmol/L — AB (ref 3.5–5.1)
SODIUM: 137 mmol/L (ref 135–145)
TCO2: 25 mmol/L (ref 22–32)

## 2017-06-19 LAB — CBG MONITORING, ED: Glucose-Capillary: 109 mg/dL — ABNORMAL HIGH (ref 65–99)

## 2017-06-19 LAB — ETHANOL: ALCOHOL ETHYL (B): 129 mg/dL — AB (ref <10)

## 2017-06-19 LAB — I-STAT TROPONIN, ED: Troponin i, poc: 0.02 ng/mL (ref 0.00–0.08)

## 2017-06-19 LAB — PROTIME-INR
INR: 1.23
PROTHROMBIN TIME: 15.4 s — AB (ref 11.4–15.2)

## 2017-06-19 LAB — APTT: APTT: 32 s (ref 24–36)

## 2017-06-19 SURGERY — RADIOLOGY WITH ANESTHESIA
Anesthesia: General

## 2017-06-19 MED ORDER — ACETAMINOPHEN 650 MG RE SUPP
650.0000 mg | RECTAL | Status: DC | PRN
  Administered 2017-06-20: 650 mg via RECTAL
  Filled 2017-06-19: qty 1

## 2017-06-19 MED ORDER — PROPOFOL 10 MG/ML IV BOLUS
INTRAVENOUS | Status: DC | PRN
  Administered 2017-06-19: 30 mg via INTRAVENOUS
  Administered 2017-06-19: 120 mg via INTRAVENOUS

## 2017-06-19 MED ORDER — ACETAMINOPHEN 325 MG PO TABS: 650.0000 mg | ORAL_TABLET | ORAL | Status: DC | PRN

## 2017-06-19 MED ORDER — ALTEPLASE (STROKE) FULL DOSE INFUSION
0.9000 mg/kg | Freq: Once | INTRAVENOUS | Status: AC
  Administered 2017-06-19: 68 mg via INTRAVENOUS
  Filled 2017-06-19: qty 100

## 2017-06-19 MED ORDER — LIDOCAINE HCL (CARDIAC) 20 MG/ML IV SOLN
INTRAVENOUS | Status: DC | PRN
  Administered 2017-06-19: 100 mg via INTRATRACHEAL

## 2017-06-19 MED ORDER — NITROGLYCERIN 1 MG/10 ML FOR IR/CATH LAB
INTRA_ARTERIAL | Status: AC
  Filled 2017-06-19: qty 10

## 2017-06-19 MED ORDER — IOPAMIDOL (ISOVUE-370) INJECTION 76%
INTRAVENOUS | Status: AC
  Filled 2017-06-19: qty 50

## 2017-06-19 MED ORDER — CEFAZOLIN SODIUM-DEXTROSE 2-4 GM/100ML-% IV SOLN
INTRAVENOUS | Status: AC
  Filled 2017-06-19: qty 100

## 2017-06-19 MED ORDER — LABETALOL HCL 5 MG/ML IV SOLN
10.0000 mg | Freq: Once | INTRAVENOUS | Status: AC | PRN
  Administered 2017-06-20: 10 mg via INTRAVENOUS
  Filled 2017-06-19: qty 4

## 2017-06-19 MED ORDER — FENTANYL CITRATE (PF) 100 MCG/2ML IJ SOLN
INTRAMUSCULAR | Status: DC | PRN
  Administered 2017-06-19: 100 ug via INTRAVENOUS

## 2017-06-19 MED ORDER — EPTIFIBATIDE 20 MG/10ML IV SOLN
INTRAVENOUS | Status: AC
  Filled 2017-06-19: qty 10

## 2017-06-19 MED ORDER — PHENYLEPHRINE HCL 10 MG/ML IJ SOLN
INTRAVENOUS | Status: DC | PRN
  Administered 2017-06-19: 20 ug/min via INTRAVENOUS

## 2017-06-19 MED ORDER — LACTATED RINGERS IV SOLN
INTRAVENOUS | Status: DC | PRN
  Administered 2017-06-19: 23:00:00 via INTRAVENOUS

## 2017-06-19 MED ORDER — IOPAMIDOL (ISOVUE-300) INJECTION 61%
INTRAVENOUS | Status: AC
  Administered 2017-06-19: 60 mL via INTRA_ARTERIAL
  Filled 2017-06-19: qty 100

## 2017-06-19 MED ORDER — CEFAZOLIN SODIUM-DEXTROSE 2-3 GM-%(50ML) IV SOLR
INTRAVENOUS | Status: DC | PRN
  Administered 2017-06-19: 2 g via INTRAVENOUS

## 2017-06-19 MED ORDER — STROKE: EARLY STAGES OF RECOVERY BOOK
Freq: Once | Status: AC
  Administered 2017-06-20: 03:00:00
  Filled 2017-06-19 (×2): qty 1

## 2017-06-19 MED ORDER — NICARDIPINE HCL IN NACL 20-0.86 MG/200ML-% IV SOLN
0.0000 mg/h | INTRAVENOUS | Status: DC | PRN
  Administered 2017-06-20: 5 mg/h via INTRAVENOUS
  Administered 2017-06-21: 7.5 mg/h via INTRAVENOUS
  Administered 2017-06-21: 3 mg/h via INTRAVENOUS
  Administered 2017-06-21 (×2): 5 mg/h via INTRAVENOUS
  Administered 2017-06-21: 7.5 mg/h via INTRAVENOUS
  Administered 2017-06-21: 5 mg/h via INTRAVENOUS
  Administered 2017-06-22: 10 mg/h via INTRAVENOUS
  Administered 2017-06-22: 7.5 mg/h via INTRAVENOUS
  Administered 2017-06-22: 3 mg/h via INTRAVENOUS
  Administered 2017-06-22: 15 mg/h via INTRAVENOUS
  Administered 2017-06-22: 5 mg/h via INTRAVENOUS
  Administered 2017-06-22 (×2): 10 mg/h via INTRAVENOUS
  Administered 2017-06-23: 5 mg/h via INTRAVENOUS
  Filled 2017-06-19 (×2): qty 200
  Filled 2017-06-19: qty 400
  Filled 2017-06-19 (×7): qty 200
  Filled 2017-06-19: qty 400
  Filled 2017-06-19 (×3): qty 200

## 2017-06-19 MED ORDER — FENTANYL CITRATE (PF) 100 MCG/2ML IJ SOLN
INTRAMUSCULAR | Status: AC
  Filled 2017-06-19: qty 2

## 2017-06-19 MED ORDER — PANTOPRAZOLE SODIUM 40 MG IV SOLR
40.0000 mg | Freq: Every day | INTRAVENOUS | Status: DC
Start: 2017-06-19 — End: 2017-06-23
  Administered 2017-06-19 – 2017-06-22 (×4): 40 mg via INTRAVENOUS
  Filled 2017-06-19 (×4): qty 40

## 2017-06-19 MED ORDER — SUCCINYLCHOLINE 20MG/ML (10ML) SYRINGE FOR MEDFUSION PUMP - OPTIME
INTRAMUSCULAR | Status: DC | PRN
  Administered 2017-06-19: 60 mg via INTRAVENOUS

## 2017-06-19 MED ORDER — SODIUM CHLORIDE 0.9 % IV SOLN
50.0000 mL/h | INTRAVENOUS | Status: DC
  Administered 2017-06-19 – 2017-06-22 (×4): 50 mL/h via INTRAVENOUS

## 2017-06-19 MED ORDER — ROCURONIUM 10MG/ML (10ML) SYRINGE FOR MEDFUSION PUMP - OPTIME
INTRAVENOUS | Status: DC | PRN
  Administered 2017-06-19: 30 mg via INTRAVENOUS
  Administered 2017-06-19: 20 mg via INTRAVENOUS

## 2017-06-19 MED ORDER — ACETAMINOPHEN 160 MG/5ML PO SOLN
650.0000 mg | ORAL | Status: DC | PRN
Start: 2017-06-19 — End: 2017-06-23

## 2017-06-19 NOTE — Anesthesia Preprocedure Evaluation (Addendum)
Anesthesia Evaluation  Patient identified by MRN, date of birth, ID band Patient unresponsive    Reviewed: Allergy & Precautions, NPO status , Patient's Chart, lab work & pertinent test results, Unable to perform ROS - Chart review onlyPreop documentation limited or incomplete due to emergent nature of procedure.  Airway Mallampati: II  TM Distance: >3 FB Neck ROM: Full    Dental  (+) Edentulous Upper, Edentulous Lower   Pulmonary COPD, Current Smoker,    breath sounds clear to auscultation + decreased breath sounds      Cardiovascular hypertension, Pt. on home beta blockers Normal cardiovascular exam Rhythm:Regular Rate:Normal     Neuro/Psych left sided weakness and facial droop CVA, Residual Symptoms negative psych ROS   GI/Hepatic negative GI ROS, Neg liver ROS,   Endo/Other  Hypothyroidism   Renal/GU negative Renal ROS     Musculoskeletal negative musculoskeletal ROS (+)   Abdominal   Peds  Hematology negative hematology ROS (+)   Anesthesia Other Findings Code Stroke  Reproductive/Obstetrics                            Anesthesia Physical Anesthesia Plan  ASA: IV and emergent  Anesthesia Plan: General   Post-op Pain Management:    Induction: Intravenous  PONV Risk Score and Plan: 1 and Ondansetron and Treatment may vary due to age or medical condition  Airway Management Planned: Oral ETT  Additional Equipment:   Intra-op Plan:   Post-operative Plan: Post-operative intubation/ventilation  Informed Consent: I have reviewed the patients History and Physical, chart, labs and discussed the procedure including the risks, benefits and alternatives for the proposed anesthesia with the patient or authorized representative who has indicated his/her understanding and acceptance.   Dental advisory given  Plan Discussed with: CRNA  Anesthesia Plan Comments:         Anesthesia  Quick Evaluation

## 2017-06-19 NOTE — Anesthesia Procedure Notes (Signed)
Procedure Name: Intubation Date/Time: 06/19/2017 11:12 PM Performed by: Claris Che, CRNA Pre-anesthesia Checklist: Patient identified, Emergency Drugs available, Suction available, Patient being monitored and Timeout performed Patient Re-evaluated:Patient Re-evaluated prior to induction Oxygen Delivery Method: Circle system utilized Preoxygenation: Pre-oxygenation with 100% oxygen Induction Type: IV induction, Rapid sequence and Cricoid Pressure applied Laryngoscope Size: Mac and 4 Grade View: Grade II Tube type: Subglottic suction tube Tube size: 7.5 mm Number of attempts: 1 Airway Equipment and Method: Stylet Placement Confirmation: ETT inserted through vocal cords under direct vision,  positive ETCO2 and breath sounds checked- equal and bilateral Secured at: 24 cm Tube secured with: Tape Dental Injury: Teeth and Oropharynx as per pre-operative assessment

## 2017-06-19 NOTE — ED Triage Notes (Addendum)
Per [facility], LSN 1700, pt was with wife at home. Pt went to the bathroom and then was found laying in the floor by wife to be aphasic with left sided weakness and facial droop. CBG 74, HR 80, BP 160/90, RR 16, spo2 95% on RA.

## 2017-06-19 NOTE — ED Provider Notes (Signed)
[facility]URGICAL [facility] Provider Note   CSN: [id number] Arrival date & time: 06/19/17  1911     History   Chief Complaint Chief Complaint  Patient presents with  . Code Stroke    HPI Ronald [name] is a 77 y.o. male.  HPI  77 year old male history of hypertension compliant with medications, prior cerebrovascular accident remote with Coumadin therapy anticoagulated, residual delayed speech but no focal deficits from prior stroke, presents the emergency department after EMS were called to residents by his roommate finding him with altered mental status after she heard him fall from standing in the bathroom.  The roommate found the patient with right arm right leg extended with eyes rolled back.  Denies any apnea.  Prior to this patient has no illness/trauma.  Past Medical History:  Diagnosis Date  . COPD (chronic obstructive pulmonary disease) (HCC)   . Emphysema lung (HCC)   . Hx of long term use of blood thinners   . Hypertension   . Stroke [place](HCC) 2013  . Thyroid disease    hypothyroidism    Patient Active Problem List   Diagnosis Date Noted  . 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 No pertinent surgical history.     Home Medications    Prior to Admission medications   Medication Sig Start Date End Date Taking? Authorizing Provider  Aspirin-Salicylamide-Caffeine (ARTHRITIS STRENGTH BC POWDER PO) Take 1 packet by mouth every hour.   Yes [provider]  gabapentin (NEURONTIN) 100 MG capsule Take 100 mg by mouth 3 (three) times daily as needed (back pain/spasms).  05/19/17  Yes [provider]  levothyroxine (SYNTHROID, LEVOTHROID) 125 MCG tablet Take 125 mcg by mouth daily after supper.  05/19/17  Yes [provider]  metoprolol tartrate (LOPRESSOR) 50 MG tablet Take 50 mg by mouth daily after supper.  05/19/17  Yes [provider]  warfarin (COUMADIN) 5 MG tablet Take 5 mg by mouth daily after supper.   05/19/17  Yes [provider]    Family History History reviewed. No pertinent family history.  Social History Social History   Tobacco Use  . Smoking status: Current Every Day Smoker    Packs/day: 1.00    Types: Cigarettes  Substance Use Topics  . Alcohol use: Yes    Comment: Drinks 2 or 3 beers per day  . Drug use: No     Allergies   Patient has no known allergies.   Review of Systems Review of Systems  Unable to perform ROS: Mental status change     Physical Exam Updated Vital Signs BP (!) 163/87 (BP Location: Left Arm)   Pulse 91   Temp (!) 97 F (36.1 C) (Oral)   Resp (!) 32   Ht 5\' 11"  (1.803 m)   Wt 72.5 kg (159 lb 13.3 oz)   SpO2 95%   BMI 22.29 kg/m   Physical Exam  Physical Exam Vitals:   06/19/17 2115 06/19/17 2130  BP: (!) 165/96 (!) 163/87  Pulse: 94 91  Resp: (!) 26 (!) 32  Temp:  (!) 97 F (36.1 C)  SpO2: 97% 95%   Constitutional: Patient is in mild acute distress Head: Normocephalic and atraumatic.  Eyes: Extraocular motion intact, no scleral icterus Neck: Supple without meningismus, mass, or overt JVD Respiratory: Effort normal and breath sounds normal. No respiratory distress. CV: Heart regular rate and rhythm, no obvious murmurs.  Pulses +2 and symmetric Abdomen: Soft, non-tender, non-distended MSK: Extremities are atraumatic  without deformity, ROM intact Skin: Warm, dry, intact Neuro: L sided facial droop; L sided UE/LE weakness; aphasia   ED Treatments / Results  Labs (all labs ordered are listed, but only abnormal results are displayed) Labs Reviewed  ETHANOL - Abnormal; Notable for the following components:      Result Value   Alcohol, Ethyl (B) 129 (*)    All other components within normal limits  PROTIME-INR - Abnormal; Notable for the following components:   Prothrombin Time 15.4 (*)    All other components within normal limits  CBC - Abnormal; Notable for the following components:   RBC 4.12 (*)    MCH  35.0 (*)    Platelets 146 (*)    All other components within normal limits  COMPREHENSIVE METABOLIC PANEL - Abnormal; Notable for the following components:   Potassium 3.4 (*)    Chloride 100 (*)    Glucose, Bld 119 (*)    Total Protein 6.3 (*)    All other components within normal limits  CBG MONITORING, ED - Abnormal; Notable for the following components:   Glucose-Capillary 109 (*)    All other components within normal limits  I-STAT CHEM 8, ED - Abnormal; Notable for the following components:   Potassium 3.4 (*)    Chloride 98 (*)    Glucose, Bld 111 (*)    Calcium, Ion 1.12 (*)    All other components within normal limits  MRSA PCR SCREENING  APTT  DIFFERENTIAL  RAPID URINE DRUG SCREEN, HOSP PERFORMED  URINALYSIS, ROUTINE W REFLEX MICROSCOPIC  CBC  HEMOGLOBIN A1C  LIPID PANEL  I-STAT TROPONIN, ED    EKG  EKG Interpretation None       Radiology Ct Angio Head W Or Wo Contrast  Result Date: 06/19/2017 CLINICAL DATA:  Code stroke presentation with left-sided weakness. EXAM: CT ANGIOGRAPHY HEAD AND NECK TECHNIQUE: Multidetector CT imaging of the head and neck was performed using the standard protocol during bolus administration of intravenous contrast. Multiplanar CT image reconstructions and MIPs were obtained to evaluate the vascular anatomy. Carotid stenosis measurements (when applicable) are obtained utilizing NASCET criteria, using the distal internal carotid diameter as the denominator. CONTRAST:  50 cc Isovue 370 COMPARISON:  Head CT earlier same day FINDINGS: CTA NECK FINDINGS Aortic arch: Aortic atherosclerosis but no aneurysm or dissection. Branching pattern of the brachiocephalic vessels from the arch is normal without origin stenosis. Right carotid system: Common carotid artery widely patent to the bifurcation region. Atherosclerotic plaque at the carotid bifurcation and proximal ICA. Minimal diameter in the ICA bulb is 4.5 mm. Compared to a more distal cervical ICA  diameter 4.5 mm, there is no stenosis. Left carotid system: Common carotid artery widely patent to the bifurcation. Calcified plaque at the carotid bifurcation and ICA bulb. Minimal diameter of the ICA bulb measures 3.2 mm. Compared to a more distal cervical ICA diameter of 4.5 mm, this indicates a 30-40% stenosis. Vertebral arteries: Right vertebral artery is dominant. Atherosclerotic calcification at the right vertebral artery origin with stenosis of 50%. Beyond that, the vessel is widely patent through the cervical region. Non dominant left vertebral artery shows calcified plaque at its origin with 50% stenosis. Beyond that, the vessel is patent through the cervical region to the foramen magnum. Skeleton: Degenerative spondylosis.  No bony canal stenosis. Other neck: No mass or lymphadenopathy. Upper chest: Mild emphysema and scarring. Review of the MIP images confirms the above findings CTA HEAD FINDINGS Anterior circulation: Both internal carotid arteries  are patent through the skull base and siphon regions. There is siphon atherosclerotic calcification maximal stenosis estimated at 30%. The A1 and M1 segments are patent on both sides. There is moderate stenosis in the right A1 segment. No M2 occlusion seen on the left. On the right, there appears to be occlusion of an anterior temporal M2 branch. There are serial stenoses in the right anterior cerebral artery. Posterior circulation: Dominant right vertebral artery shows 30% stenosis in the V4 segment but is patent to the basilar. Non dominant left vertebral artery shows 50% stenosis in the V4 segment but is patent to the basilar. No basilar stenosis. The basilar artery does show some atherosclerotic irregularity. Superior cerebellar and posterior cerebral arteries show flow. Patent posterior communicating artery on the right. Venous sinuses: Patent and normal. Limited opacification due to early face scanning. Anatomic variants: None significant. Delayed phase:  No abnormal enhancement. Review of the MIP images confirms the above findings IMPRESSION: Diffuse intracranial atherosclerotic disease. Apparent occlusion of a right insular/anterior temporal M2 branch. Serial severe stenoses in the right anterior cerebral artery. Atherosclerotic irregularity in both A1 segments. Atherosclerotic irregularity of the basilar artery without flow limiting stenosis. Aortic atherosclerosis. Carotid bifurcation atherosclerosis without stenosis. 50% vertebral artery origin stenoses. 30-50% V4 stenoses. Electronically Signed   By: Paulina Fusi M.D.   On: 06/19/2017 20:01   Ct Angio Neck W Or Wo Contrast  Result Date: 06/19/2017 CLINICAL DATA:  Code stroke presentation with left-sided weakness. EXAM: CT ANGIOGRAPHY HEAD AND NECK TECHNIQUE: Multidetector CT imaging of the head and neck was performed using the standard protocol during bolus administration of intravenous contrast. Multiplanar CT image reconstructions and MIPs were obtained to evaluate the vascular anatomy. Carotid stenosis measurements (when applicable) are obtained utilizing NASCET criteria, using the distal internal carotid diameter as the denominator. CONTRAST:  50 cc Isovue 370 COMPARISON:  Head CT earlier same day FINDINGS: CTA NECK FINDINGS Aortic arch: Aortic atherosclerosis but no aneurysm or dissection. Branching pattern of the brachiocephalic vessels from the arch is normal without origin stenosis. Right carotid system: Common carotid artery widely patent to the bifurcation region. Atherosclerotic plaque at the carotid bifurcation and proximal ICA. Minimal diameter in the ICA bulb is 4.5 mm. Compared to a more distal cervical ICA diameter 4.5 mm, there is no stenosis. Left carotid system: Common carotid artery widely patent to the bifurcation. Calcified plaque at the carotid bifurcation and ICA bulb. Minimal diameter of the ICA bulb measures 3.2 mm. Compared to a more distal cervical ICA diameter of 4.5 mm, this  indicates a 30-40% stenosis. Vertebral arteries: Right vertebral artery is dominant. Atherosclerotic calcification at the right vertebral artery origin with stenosis of 50%. Beyond that, the vessel is widely patent through the cervical region. Non dominant left vertebral artery shows calcified plaque at its origin with 50% stenosis. Beyond that, the vessel is patent through the cervical region to the foramen magnum. Skeleton: Degenerative spondylosis.  No bony canal stenosis. Other neck: No mass or lymphadenopathy. Upper chest: Mild emphysema and scarring. Review of the MIP images confirms the above findings CTA HEAD FINDINGS Anterior circulation: Both internal carotid arteries are patent through the skull base and siphon regions. There is siphon atherosclerotic calcification maximal stenosis estimated at 30%. The A1 and M1 segments are patent on both sides. There is moderate stenosis in the right A1 segment. No M2 occlusion seen on the left. On the right, there appears to be occlusion of an anterior temporal M2 branch. There are serial  stenoses in the right anterior cerebral artery. Posterior circulation: Dominant right vertebral artery shows 30% stenosis in the V4 segment but is patent to the basilar. Non dominant left vertebral artery shows 50% stenosis in the V4 segment but is patent to the basilar. No basilar stenosis. The basilar artery does show some atherosclerotic irregularity. Superior cerebellar and posterior cerebral arteries show flow. Patent posterior communicating artery on the right. Venous sinuses: Patent and normal. Limited opacification due to early face scanning. Anatomic variants: None significant. Delayed phase: No abnormal enhancement. Review of the MIP images confirms the above findings IMPRESSION: Diffuse intracranial atherosclerotic disease. Apparent occlusion of a right insular/anterior temporal M2 branch. Serial severe stenoses in the right anterior cerebral artery. Atherosclerotic  irregularity in both A1 segments. Atherosclerotic irregularity of the basilar artery without flow limiting stenosis. Aortic atherosclerosis. Carotid bifurcation atherosclerosis without stenosis. 50% vertebral artery origin stenoses. 30-50% V4 stenoses. Electronically Signed   By: Paulina Fusi M.D.   On: 06/19/2017 20:01   Ct Cerebral Perfusion W Contrast  Result Date: 06/19/2017 CLINICAL DATA:  Left-sided weakness. EXAM: CT PERFUSION BRAIN TECHNIQUE: Multiphase CT imaging of the brain was performed following IV bolus contrast injection. Subsequent parametric perfusion maps were calculated using RAPID software. CONTRAST:  40 cc Isovue 370 COMPARISON:  CT angiography and CT same day. FINDINGS: CT Brain Perfusion Findings: CBF (<30%) Volume: 24mL, primarily in the inferior right temporal lobe and posterior inferior right parietal lobe. Perfusion (Tmax>6.0s) volume: , with the mismatch volume being primarily in the deep insular region on the right with some additional brain along the margins of the right parietal CBF less than 30% region. Mismatch Volume: 82 mL. This may be slightly overstated based on inclusion of some brain in the region of the old left posterior frontal infarction. Infarction Location:Right MCA territory The examination does not have any significant motion degradation. Hypoperfusion index is 0.4, an unfavorable finding. There does not appear to be vigorous collateral supply. IMPRESSION: Acute changes in the right MCA territory with likely completed infarction affecting the right inferior temporal lobe and right inferior parietal lobe with potentially salvageable brain in the insular region and along the margins of the likely completed right parietal infarction. Electronically Signed   By: Paulina Fusi M.D.   On: 06/19/2017 20:22   Dg Chest Portable 1 View  Result Date: 06/19/2017 CLINICAL DATA:  Dyspnea.  History of emphysema EXAM: PORTABLE CHEST 1 VIEW COMPARISON:  03/07/2016 FINDINGS:  The heart is enlarged but stable. 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 Low lung volumes with mild vascular crowding and streaky areas of atelectasis but no definite infiltrates, edema or effusions. The bony thorax is intact. IMPRESSION: Stable cardiac enlargement. Low lung volumes with vascular crowding and streaky atelectasis. Electronically Signed   By: Rudie Meyer M.D.   On: 06/19/2017 21:08   Ct Head Code Stroke Wo Contrast  Result Date: 06/19/2017 CLINICAL DATA:  Code stroke. Left-sided weakness. Acute presentation. EXAM: CT HEAD WITHOUT CONTRAST TECHNIQUE: Contiguous axial images were obtained from the base of the skull through the vertex without intravenous contrast. COMPARISON:  MRI 07/17/2012 FINDINGS: Brain: No abnormality is seen affecting the brainstem or cerebellum. Right cerebral hemisphere shows chronic small-vessel changes of the white matter without evidence of acute infarction. Left hemisphere shows an old insular to posterior frontal cortical and subcortical infarction with encephalomalacia. Chronic small-vessel changes of the white matter. No sign of acute infarction. No mass lesion, hemorrhage, hydrocephalus or extra-axial collection. Vascular: There is atherosclerotic calcification of  the major vessels at the base of the brain. Skull: Negative Sinuses/Orbits: No significant sinus finding.  Orbits negative. Other: None ASPECTS ([place] Stroke Program Early CT Score) - Ganglionic level infarction (caudate, lentiform nuclei, internal capsule, insula, M1-M3 cortex): 7 - Supraganglionic infarction (M4-M6 cortex): 3 Total score (0-10 with 10 being normal): 10 IMPRESSION: 1. No acute finding. Chronic small-vessel changes of the white matter. 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 2. ASPECTS is 10 3. These results were communicated to Dr. Amada Jupiter at 7:29 pmon 2/18/2019by text page via the [facility] messaging system. Electronically Signed   By: Paulina Fusi M.D.   On: 06/19/2017 19:30     Procedures Procedures (including critical care time)  Medications Ordered in ED Medications  iopamidol (ISOVUE-370) 76 % injection (not administered)  iopamidol (ISOVUE-370) 76 % injection (not administered)   stroke: mapping our early stages of recovery book (not administered)  0.9 %  sodium chloride infusion (50 mL/hr Intravenous New Bag/Given 06/19/17 2031)  acetaminophen (TYLENOL) tablet 650 mg (not administered)    Or  acetaminophen (TYLENOL) solution 650 mg (not administered)    Or  acetaminophen (TYLENOL) suppository 650 mg (not administered)  labetalol (NORMODYNE,TRANDATE) injection 10 mg (not administered)    And  nicardipine (CARDENE) 20mg  in 0.86% saline IV infusion (0.1 mg/ml) (not administered)  pantoprazole (PROTONIX) injection 40 mg (not administered)  alteplase (ACTIVASE) 1 mg/mL infusion 68 mg (0 mg/kg  75 kg Intravenous Stopped 06/19/17 2059)     Initial Impression / Assessment and Plan / ED Course  I have reviewed the triage vital signs and the nursing notes.  Pertinent labs & imaging results that were available during my care of the patient were reviewed by me and considered in my medical decision making (see chart for details).     77 year old male history of hypertension compliant with medications, prior cerebrovascular accident remote with Coumadin therapy anticoagulated, residual delayed speech but no focal deficits from prior stroke, presents the emergency department after EMS were called to residents by his roommate finding him with altered mental status after she heard him fall from standing in the bathroom.  The roommate found the patient with right arm right leg extended with eyes rolled back.  Denies any apnea.  Prior to this patient has no illness/trauma.  This patient activated as a code stroke.  Neurology evaluated the patient with CT evaluation CT angios the head showing diffuse intracranial atherosclerotic disease and occlusion of the right  insular/anterior temporal M2 branch.  Aortic atherosclerosis carotid bifurcation atherosclerosis without stenosis.  50% vertebral artery origin stenosis.  CT cerebral perfusion study showing right MCA territory likely completed infarction with right inferior temporal lobe/right inferior parietal lobe with potentially salvageable brain in the insular region.  Neurology recommended TPA which was given in the emergency department.  Patient is being admitted to neuro ICU.  Final Clinical Impressions(s) / ED Diagnoses   Final diagnoses:  None    ED Discharge Orders    None       Jaynie Collins, DO 06/19/17 2145    Pricilla Loveless, MD 06/20/17 0001

## 2017-06-19 NOTE — Progress Notes (Signed)
Patient restless, bladder scan shows 850ml urine retention. Notified Dr Amada JupiterKirkpatrick.  Pt will be going down to IR per Dr Amada JupiterKirkpatrick. MD wants to wait on foley since he just got tpa and IR can insert when he goes under general anesthesia.

## 2017-06-19 NOTE — Procedures (Signed)
S/P RT common carotid arteriogram RT CFA approach  Findings. 1.No acute occlusions ,AV shunting or aneurysms noted. 2.Focal areas of mod to mod severe narrowing noted of the RT ACA A2 seg and the Rt MCA trifurcation branches

## 2017-06-19 NOTE — H&P (Signed)
Neurology Consultation Reason for Consult: Left sided weakness Referring Physician: Alric RanGoldston, S  CC: Left sided weakness  History is obtained from:patient  HPI: Ronald [name] is a 77 y.o. male with a history of previous L MCA stroke who was LKW while eating dineer at 5:30 pm. He said he was not feeling well and stood to go to the bathroom, but fell en route. He was seen to no tbe moving his left side with right gaze preference en route, but had some improvement in the left sided weakness on arrival.   He was not speaking much either.   At baseline, he has some expressive aphasia.  LKW: 5:30 pm.  tpa given?: yes Premorbid modified rankin scale: 1    ROS:  Unable to obtain due to altered mental status.   PMH:  Hypertension.  COPD CAD Hypothyroid Hypertension Tobacco abuse  Family history: Unable to obtain due to altered mental status   Social History:  Current smoker 2 To 3 beers a day  Exam: Current vital signs: Vitals:   06/19/17 2100 06/19/17 2115  BP: (!) 172/93 (!) 165/96  Pulse: 97 94  Resp: (!) 30 (!) 26  Temp:    SpO2: 95% 97%     Vital signs in last 24 hours:     Physical Exam  Constitutional: Appears elderly Psych: Affect appropriate to situation Eyes: No scleral injection HENT: No OP obstrucion Head: Normocephalic.  Cardiovascular: Normal rate and regular rhythm.  Respiratory: Effort normal, non-labored breathing, significant upper airway sounds GI: Soft.  No distension. There is no tenderness.  Skin: WDI  Neuro: Mental Status: Patient is awake, he follows commands, but does not speak. He does not appear to be attending to the left side as well as the right Cranial Nerves: II: He responds to visual stimuli in the right visual field but not the left.  Pupils are equal, round, and reactive to light.   III,IV, VI: R gaze preference.  V: Facial sensation is diminished on the left VII: Facial movement is symmetric.  VIII: hearing is intact to  voice X: Uvula elevates symmetrically XI: Shoulder shrug is symmetric. XII: tongue is midline without atrophy or fasciculations.  Motor: On arrival, he had  aleft hemiparesis, though was markedly improving after IV TPA. He is able to lift the left arm and leg against gravity, but clearly is weaker on the left than the right. Sensory: Sensation is diminished on the left Cerebellar: No clear ataxia   I have reviewed labs in epic and the results pertinent to this consultation are: CMP-relatively unremarkable, creatinine 0.8 mild hypokalemia at 3.4   I have reviewed the images obtained: CT head-no acute findings CTA- there is a questionable finding with a poor quality study In the anterior temporal branch on the right.  Impression: 77 year old male with acute left-sided weakness and questionable M2 occlusion with improvement following IV TPA.  Initially on review of the CTA, it did not seem to be definite or there is any large vessel occlusion and he was improving after IV TPA. He was not felt to be a candidate for thrombectomy at that time. However given persistent deficits and subsequent revisiting with the family, an angiogram was offered to assess if there was truly an LVO and he was taken for this which was negative.  Etiology is likely atrial fibrillation with some therapeutic INR.  He had to be intubated given agitation in order to be able to do the angiogram, but was not able to  be extubated immediately because of the agitation as well as his history of COPD and concerns from anesthesia.  Recommendations: 1. HgbA1c, fasting lipid panel 2. MRI of the brain without contrast 3. Frequent neuro checks 4. Echocardiogram 5. Prophylactic therapy-none for 24 hours 7. Risk factor modification 8. Telemetry monitoring 9. PT consult, OT consult, Speech consult 10. CCM consult  This patient is critically ill and at significant risk of neurological worsening, death and care requires  constant monitoring of vital signs, hemodynamics,respiratory and cardiac monitoring, neurological assessment, discussion with family, other specialists and medical decision making of high complexity. I spent 50 minutes of neurocritical care time  in the care of  this patient.  Ritta Slot, MD [facility] [phone]  If 7pm- 7am, please page neurology on call as listed in AMION. 06/20/2017  12:10 AM

## 2017-06-19 NOTE — Progress Notes (Signed)
Patient transferred to IR at 2300. Report given to Renea EeEvelyn, [place]

## 2017-06-19 NOTE — Progress Notes (Signed)
Pharmacist Code Stroke Response  Notified to mix tPA at 1952 by Dr. Amada JupiterKirkpatrick Delivered tPA to RN at 1956  Issues/delays encountered (if applicable):  Waiting on INR to come back. INR low at 1.23  Bryndan Bilyk J 06/19/17 8:08 PM

## 2017-06-19 NOTE — ED Notes (Signed)
Pt becoming more agitated, trying to pull cover off and get up. Dr Amada JupiterKirkpatrick and Dr Criss AlvineGoldston in room. BP recycled and 169/84. Pt starting to settle back down now. Portable in room.

## 2017-06-20 ENCOUNTER — Inpatient Hospital Stay (HOSPITAL_COMMUNITY): Payer: Medicare Other

## 2017-06-20 ENCOUNTER — Encounter (HOSPITAL_COMMUNITY): Payer: Self-pay | Admitting: Interventional Radiology

## 2017-06-20 DIAGNOSIS — I351 Nonrheumatic aortic (valve) insufficiency: Secondary | ICD-10-CM

## 2017-06-20 DIAGNOSIS — J9601 Acute respiratory failure with hypoxia: Secondary | ICD-10-CM

## 2017-06-20 DIAGNOSIS — I4891 Unspecified atrial fibrillation: Secondary | ICD-10-CM

## 2017-06-20 DIAGNOSIS — I63311 Cerebral infarction due to thrombosis of right middle cerebral artery: Secondary | ICD-10-CM

## 2017-06-20 DIAGNOSIS — I639 Cerebral infarction, unspecified: Secondary | ICD-10-CM

## 2017-06-20 DIAGNOSIS — J690 Pneumonitis due to inhalation of food and vomit: Secondary | ICD-10-CM

## 2017-06-20 DIAGNOSIS — E876 Hypokalemia: Secondary | ICD-10-CM

## 2017-06-20 DIAGNOSIS — R339 Retention of urine, unspecified: Secondary | ICD-10-CM

## 2017-06-20 LAB — ECHOCARDIOGRAM COMPLETE
HEIGHTINCHES: 71 in
WEIGHTICAEL: 2557.34 [oz_av]

## 2017-06-20 LAB — BASIC METABOLIC PANEL
ANION GAP: 10 (ref 5–15)
BUN: 9 mg/dL (ref 6–20)
CHLORIDE: 103 mmol/L (ref 101–111)
CO2: 21 mmol/L — ABNORMAL LOW (ref 22–32)
Calcium: 8.6 mg/dL — ABNORMAL LOW (ref 8.9–10.3)
Creatinine, Ser: 0.71 mg/dL (ref 0.61–1.24)
GFR calc non Af Amer: >60 mL/min (ref >60)
Glucose, Bld: 123 mg/dL — ABNORMAL HIGH (ref 65–99)
POTASSIUM: 4.4 mmol/L (ref 3.5–5.1)
SODIUM: 134 mmol/L — AB (ref 135–145)

## 2017-06-20 LAB — CBC
HCT: 37.9 % — ABNORMAL LOW (ref 39.0–52.0)
Hemoglobin: 13.2 g/dL (ref 13.0–17.0)
MCH: 34.2 pg — AB (ref 26.0–34.0)
MCHC: 34.8 g/dL (ref 30.0–36.0)
MCV: 98.2 fL (ref 78.0–100.0)
PLATELETS: 147 10*3/uL — AB (ref 150–400)
RBC: 3.86 MIL/uL — ABNORMAL LOW (ref 4.22–5.81)
RDW: 13.6 % (ref 11.5–15.5)
WBC: 8.4 10*3/uL (ref 4.0–10.5)

## 2017-06-20 LAB — URINALYSIS, ROUTINE W REFLEX MICROSCOPIC
Bilirubin Urine: NEGATIVE
GLUCOSE, UA: NEGATIVE mg/dL
KETONES UR: NEGATIVE mg/dL
Leukocytes, UA: NEGATIVE
Nitrite: NEGATIVE
Protein, ur: NEGATIVE mg/dL
SQUAMOUS EPITHELIAL / LPF: NONE SEEN
Specific Gravity, Urine: 1.02 (ref 1.005–1.030)
pH: 5 (ref 5.0–8.0)

## 2017-06-20 LAB — GLUCOSE, CAPILLARY: GLUCOSE-CAPILLARY: 86 mg/dL (ref 65–99)

## 2017-06-20 LAB — POCT I-STAT 3, ART BLOOD GAS (G3+)
Acid-base deficit: 4 mmol/L — ABNORMAL HIGH (ref 0.0–2.0)
Bicarbonate: 22.8 mmol/L (ref 20.0–28.0)
O2 SAT: 95 %
TCO2: 24 mmol/L (ref 22–32)
pCO2 arterial: 46.7 mmHg (ref 32.0–48.0)
pH, Arterial: 7.297 — ABNORMAL LOW (ref 7.350–7.450)
pO2, Arterial: 82 mmHg — ABNORMAL LOW (ref 83.0–108.0)

## 2017-06-20 LAB — RAPID URINE DRUG SCREEN, HOSP PERFORMED
AMPHETAMINES: NOT DETECTED
BARBITURATES: NOT DETECTED
BENZODIAZEPINES: NOT DETECTED
COCAINE: NOT DETECTED
Opiates: NOT DETECTED
Tetrahydrocannabinol: NOT DETECTED

## 2017-06-20 LAB — HEMOGLOBIN A1C
HEMOGLOBIN A1C: 5.4 % (ref 4.8–5.6)
Mean Plasma Glucose: 108.28 mg/dL

## 2017-06-20 LAB — MRSA PCR SCREENING: MRSA BY PCR: NEGATIVE

## 2017-06-20 LAB — LIPID PANEL
Cholesterol: 174 mg/dL (ref 0–200)
HDL: 61 mg/dL (ref >40)
LDL CALC: 103 mg/dL — AB (ref 0–99)
Total CHOL/HDL Ratio: 2.9 RATIO
Triglycerides: 50 mg/dL (ref <150)
VLDL: 10 mg/dL (ref 0–40)

## 2017-06-20 LAB — PHOSPHORUS: PHOSPHORUS: 3.2 mg/dL (ref 2.5–4.6)

## 2017-06-20 LAB — MAGNESIUM: MAGNESIUM: 1.6 mg/dL — AB (ref 1.7–2.4)

## 2017-06-20 MED ORDER — FENTANYL BOLUS VIA INFUSION
25.0000 ug | INTRAVENOUS | Status: DC | PRN
  Filled 2017-06-20: qty 25

## 2017-06-20 MED ORDER — ORAL CARE MOUTH RINSE
15.0000 mL | Freq: Four times a day (QID) | OROMUCOSAL | Status: DC
  Administered 2017-06-20: 15 mL via OROMUCOSAL

## 2017-06-20 MED ORDER — SODIUM CHLORIDE 0.9 % IV SOLN
3.0000 g | Freq: Four times a day (QID) | INTRAVENOUS | Status: DC
  Administered 2017-06-20 – 2017-06-23 (×15): 3 g via INTRAVENOUS
  Filled 2017-06-20 (×16): qty 3

## 2017-06-20 MED ORDER — LORAZEPAM 2 MG/ML IJ SOLN
0.5000 mg | Freq: Once | INTRAMUSCULAR | Status: DC
  Filled 2017-06-20: qty 1

## 2017-06-20 MED ORDER — ORAL CARE MOUTH RINSE
15.0000 mL | Freq: Four times a day (QID) | OROMUCOSAL | Status: DC
  Administered 2017-06-20 – 2017-06-23 (×11): 15 mL via OROMUCOSAL

## 2017-06-20 MED ORDER — ORAL CARE MOUTH RINSE
15.0000 mL | OROMUCOSAL | Status: DC
  Administered 2017-06-20 (×5): 15 mL via OROMUCOSAL

## 2017-06-20 MED ORDER — MIDAZOLAM HCL 2 MG/2ML IJ SOLN: 1.0000 mg | INTRAMUSCULAR | Status: DC | PRN

## 2017-06-20 MED ORDER — ATORVASTATIN CALCIUM 20 MG PO TABS: 20.0000 mg | ORAL_TABLET | Freq: Every day | ORAL | Status: DC

## 2017-06-20 MED ORDER — FENTANYL 2500MCG IN NS 250ML (10MCG/ML) PREMIX INFUSION
0.0000 ug/h | INTRAVENOUS | Status: DC
  Administered 2017-06-20: 100 ug/h via INTRAVENOUS
  Filled 2017-06-20: qty 250

## 2017-06-20 MED ORDER — FENTANYL CITRATE (PF) 100 MCG/2ML IJ SOLN
INTRAMUSCULAR | Status: AC
  Filled 2017-06-20: qty 2

## 2017-06-20 MED ORDER — MAGNESIUM SULFATE 2 GM/50ML IV SOLN
2.0000 g | Freq: Once | INTRAVENOUS | Status: DC
  Administered 2017-06-20: 2 g via INTRAVENOUS

## 2017-06-20 MED ORDER — MAGNESIUM SULFATE 2 GM/50ML IV SOLN
2.0000 g | Freq: Once | INTRAVENOUS | Status: AC
  Administered 2017-06-20: 2 g via INTRAVENOUS
  Filled 2017-06-20: qty 50

## 2017-06-20 MED ORDER — SODIUM CHLORIDE 0.9 % IV SOLN: INTRAVENOUS | Status: DC

## 2017-06-20 MED ORDER — POTASSIUM CHLORIDE 10 MEQ/100ML IV SOLN
10.0000 meq | INTRAVENOUS | Status: AC
  Administered 2017-06-20 (×4): 10 meq via INTRAVENOUS
  Filled 2017-06-20 (×4): qty 100

## 2017-06-20 MED ORDER — ALBUTEROL SULFATE (2.5 MG/3ML) 0.083% IN NEBU
2.5000 mg | INHALATION_SOLUTION | RESPIRATORY_TRACT | Status: DC | PRN
  Administered 2017-06-21: 2.5 mg via RESPIRATORY_TRACT
  Filled 2017-06-20 (×2): qty 3

## 2017-06-20 MED ORDER — CHLORHEXIDINE GLUCONATE 0.12% ORAL RINSE (MEDLINE KIT)
15.0000 mL | Freq: Two times a day (BID) | OROMUCOSAL | Status: DC
  Administered 2017-06-20 – 2017-06-23 (×8): 15 mL via OROMUCOSAL

## 2017-06-20 MED ORDER — LEVOTHYROXINE SODIUM 100 MCG IV SOLR
62.5000 ug | Freq: Every day | INTRAVENOUS | Status: DC
Start: 2017-06-20 — End: 2017-06-23
  Administered 2017-06-20 – 2017-06-23 (×4): 62.5 ug via INTRAVENOUS
  Filled 2017-06-20 (×4): qty 5

## 2017-06-20 MED ORDER — FENTANYL CITRATE (PF) 100 MCG/2ML IJ SOLN
50.0000 ug | Freq: Once | INTRAMUSCULAR | Status: AC
  Administered 2017-06-20: 50 ug via INTRAVENOUS

## 2017-06-20 NOTE — Procedures (Signed)
Extubation Procedure Note  Patient Details:   Name: Marinus MawOtis L Sandoz DOB: 1941-03-25 MRN: [id number]   Airway Documentation:     Evaluation  O2 sats: stable throughout Complications: No apparent complications Patient did tolerate procedure well. Bilateral Breath Sounds: Rhonchi   Yes  4l/min Gerber   Newt LukesGroendal, Brentley Landfair Ann 06/20/2017, 10:16 AM

## 2017-06-20 NOTE — Progress Notes (Signed)
Pharmacy Antibiotic Note  Marinus MawOtis L Taft is a 77 y.o. male admitted on 06/19/2017 with CVA, VDRF and possible aspiration PNA.  Pharmacy has been consulted for Unasyn dosing.  Plan: Unasyn 3 g IV q6h  Height: 5\' 11"  (180.3 cm) Weight: 159 lb 13.3 oz (72.5 kg) IBW/kg (Calculated) : 75.3  Temp (24hrs), Avg:98.1 F (36.7 C), Min:97.8 F (36.6 C), Max:98.5 F (36.9 C)  Recent Labs  Lab 06/19/17 1917 06/19/17 1922  WBC 5.8  --   CREATININE 0.80 0.90    Estimated Creatinine Clearance: 72.7 mL/min (by C-G formula based on SCr of 0.9 mg/dL).    No Known Allergies   Esgar Barnick, Gary FleetGregory Vernon 06/20/2017 1:10 AM

## 2017-06-20 NOTE — Progress Notes (Signed)
NEUROHOSPITALISTS STROKE TEAM - DAILY PROGRESS NOTE   ADMISSION HISTORY: Ronald [name] is a 77 y.o. male with a history of previous L MCA stroke who was LKW while eating dineer at 5:30 pm. He said he was not feeling well and stood to go to the bathroom, but fell en route. He was seen to no tbe moving his left side with right gaze preference en route, but had some improvement in the left sided weakness on arrival.   He was not speaking much either. At baseline, he has some expressive aphasia.  LKW: 5:30 pm.  tpa given?: yes Premorbid modified rankin scale: 1  SUBJECTIVE (INTERVAL HISTORY) Daughter is at the bedside. Patient is found laying in bed in NAD. Overall he feels his condition is unchanged. Voices no new complaints. No new events reported overnight. Blood pressure is adequately controlled. He still has groin arterial catheter   OBJECTIVE Lab Results: CBC:  Recent Labs  Lab 06/19/17 1917 06/19/17 1922 06/20/17 0331  WBC 5.8  --  8.4  HGB 14.4 14.6 13.2  HCT 40.3 43.0 37.9*  MCV 97.8  --  98.2  PLT 146*  --  147*   BMP: Recent Labs  Lab 06/19/17 1917 06/19/17 1922 06/20/17 0331  NA 135 137 134*  K 3.4* 3.4* 4.4  CL 100* 98* 103  CO2 24  --  21*  GLUCOSE 119* 111* 123*  BUN 10 11 9   CREATININE 0.80 0.90 0.71  CALCIUM 8.9  --  8.6*  MG  --   --  1.6*  PHOS  --   --  3.2   Liver Function Tests:  Recent Labs  Lab 06/19/17 1917  AST 30  ALT 19  ALKPHOS 43  BILITOT 0.6  PROT 6.3*  ALBUMIN 3.8   Thyroid Function Studies: No results for input(s): TSH, T4TOTAL, T3FREE, THYROIDAB in the last 72 hours.   Coagulation Studies:  Recent Labs    06/19/17 1917  APTT 32  INR 1.23   Microbiology:  Recent Results (from the past 240 hour(s))  MRSA PCR Screening     Status: None   Collection Time: 06/19/17  9:33 PM  Result Value Ref Range Status   MRSA by PCR NEGATIVE NEGATIVE Final    Comment:        The  GeneXpert MRSA Assay (FDA approved for NASAL specimens only), is one component of a comprehensive MRSA colonization surveillance program. It is not intended to diagnose MRSA infection nor to guide or monitor treatment for MRSA infections. Performed at [facility], [address] [place], [place] [address]   Culture, respiratory (NON-Expectorated)     Status: None (Preliminary result)   Collection Time: 06/20/17  1:30 AM  Result Value Ref Range Status   Specimen Description TRACHEAL ASPIRATE  Final   Special Requests NONE  Final   Gram Stain   Final    ABUNDANT WBC PRESENT, PREDOMINANTLY PMN ABUNDANT GRAM NEGATIVE RODS ABUNDANT GRAM NEGATIVE COCCOBACILLI MODERATE GRAM POSITIVE COCCI IN PAIRS Performed at [facility], [address] [place], [place] [address]    Culture PENDING  Incomplete   Report Status PENDING  Incomplete   Urinalysis:  Recent Labs  Lab 06/20/17 0338  COLORURINE YELLOW  APPEARANCEUR CLEAR  LABSPEC 1.020  PHURINE 5.0  GLUCOSEU NEGATIVE  HGBUR SMALL*  BILIRUBINUR NEGATIVE  KETONESUR NEGATIVE  PROTEINUR NEGATIVE  NITRITE NEGATIVE  LEUKOCYTESUR NEGATIVE   Urine Drug Screen:     Component Value  Date/Time   LABOPIA NONE DETECTED 06/19/2017 0338   COCAINSCRNUR NONE DETECTED 06/19/2017 0338   LABBENZ NONE DETECTED 06/19/2017 0338   AMPHETMU NONE DETECTED 06/19/2017 0338   THCU NONE DETECTED 06/19/2017 0338   LABBARB NONE DETECTED 06/19/2017 0338    Alcohol Level:  Recent Labs  Lab 06/19/17 1914  ETH 129*   PHYSICAL EXAM Temp:  [97.8 F (36.6 C)-100 F (37.8 C)] 98.7 F (37.1 C) (02/19 1200) Pulse Rate:  [27-128] 70 (02/19 1600) Resp:  [10-32] 24 (02/19 1600) BP: (105-178)/(45-111) 124/58 (02/19 1600) SpO2:  [88 %-100 %] 95 % (02/19 1600) FiO2 (%):  [21 %-40 %] 40 % (02/19 1000) Weight:  [72.5 kg (159 lb 13.3 oz)-75 kg (165 lb 5.5 oz)] 72.5 kg (159 lb 13.3 oz) (02/18 2130) General - Well nourished, well developed, in no  apparent distress HEENT-  Normocephalic,  Cardiovascular - Regular rate and rhythm  Respiratory - Lungs clear bilaterally. No wheezing. Abdomen - soft and non-tender, BS normal Extremities- no edema or cyanosis Mental Status: Patient is awake, he follows some simple commands, He does not appear to be attending to the left side as well as the right. Remains sedated and intubated Cranial Nerves: II: He responds to visual stimuli in the right visual field but not the left.  Pupils are equal, round, and reactive to light.   III,IV, VI: R gaze preference.  V: Facial sensation is diminished on the left VII: Facial movement is symmetric.  VIII: hearing is intact to voice X: Uvula elevates symmetrically XI: Shoulder shrug is symmetric. XII: tongue is midline without atrophy or fasciculations.  Motor: left hemiparesis 3/5 , able to lift the left arm and leg against gravity, but clearly is weaker on the left than the right. Sensory: Sensation is diminished on the left Cerebellar: No clear ataxia  IMAGING: I have personally reviewed the radiological images below and agree with the radiology interpretations. Ct Angio Head W Or Wo Contrast Ct Angio Neck W Or Wo Contrast Result Date: 06/19/2017  IMPRESSION: Diffuse intracranial atherosclerotic disease. Apparent occlusion of a right insular/anterior temporal M2 branch. Serial severe stenoses in the right anterior cerebral artery. Atherosclerotic irregularity in both A1 segments. Atherosclerotic irregularity of the basilar artery without flow limiting stenosis. Aortic atherosclerosis. Carotid bifurcation atherosclerosis without stenosis. 50% vertebral artery origin stenoses. 30-50% V4 stenoses. Electronically Signed   By: Paulina Fusi M.D.   On: 06/19/2017 20:01   Ct Cerebral Perfusion W Contrast Result Date: 06/19/2017 IMPRESSION: Acute changes in the right MCA territory with likely completed infarction affecting the right inferior temporal lobe and  right inferior parietal lobe with potentially salvageable brain in the insular region and along the margins of the likely completed right parietal infarction. Electronically Signed   By: Paulina Fusi M.D.   On: 06/19/2017 20:22   Portable Chest X-ray Result Date: 06/20/2017 IMPRESSION: 1. Tip of the endotracheal tube at the thoracic inlet. 2. Developing fluffy perihilar opacities, left greater than right, suspicious for pulmonary edema. Cardiomegaly is unchanged. Electronically Signed   By: Rubye Oaks M.D.   On: 06/20/2017 01:06   Ct Head Code Stroke Wo Contrast Result Date: 06/19/2017  IMPRESSION: 1. No acute finding. Chronic small-vessel changes of the white matter. 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 2. ASPECTS is 10   Echocardiogram:  Study Conclusions - Left ventricle: The cavity size was normal. There was moderate   concentric hypertrophy. Systolic function was normal. The   estimated ejection fraction was in the range of 55% to 60%. Wall   motion was normal; there were no regional wall motion   abnormalities. - Aortic valve: Transvalvular velocity was within the normal range.   There was no stenosis. There was moderate regurgitation.   Regurgitation pressure half-time: 409 ms. - Mitral valve: Transvalvular velocity was within the normal range.   There was no evidence for stenosis. There was mild regurgitation. - Left atrium: The atrium was severely dilated. - Right ventricle: The cavity size was normal. Wall thickness was   normal. Systolic function was mildly reduced. - Right atrium: The atrium was severely dilated. - Atrial septum: No defect or patent foramen ovale was identified   by color flow Doppler. - Tricuspid valve: There was moderate regurgitation. - Pulmonary arteries: Systolic pressure was mildly to moderately   increased. PA peak pressure: 49 mm Hg (S).  MRI Brain:                                                 PENDING     IMPRESSION: Ronald [name] is a 77 y.o. male with PMH of AFIB on Coumadin with subtherapeutic INR, Hx of CVA, HTN and Hypothyroidism who presents with acute left-sided weakness and questionable M2 occlusion with improvement following IV TPA. Due to patients persistent deficits an angiogram was done to assess if there was truly an LVO and he was taken for this which was negative.  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  Suspected Etiology: likely atrial fibrillation with some therapeutic INR. Resultant Symptoms: Left sided deficits Stroke Risk Factors: atrial fibrillation, hyperlipidemia, hypertension and smoking Other Stroke Risk Factors: Advanced age, Hx of CVA, Hypothyroidism  PROCEDURES:   Dr Corliss Skains   06/19/2017 S/P RT common carotid arteriogram RT CFA approach  Findings. 1.No acute occlusions ,AV shunting or aneurysms noted. 2.Focal areas of mod to mod severe narrowing noted of the RT ACA A2 seg and the Rt MCA trifurcation branches   Outstanding Stroke Work-up Studies:     MRI Brain:                                                    PENDING  06/20/2017 ASSESSMENT:   Neuro exam remains stable with Left sided deficits. Remains intubated and sedated. Plan to extubate later today, if possible. Imaging, labs and POC reviewed with daughter at bedside  PLAN  06/20/2017: Continue Statin, May add ASA after MRI imaging reviewed Frequent neuro checks Telemetry monitoring PT/OT/SLP Consult PM & Rehab Consult Case Management /MSW Ongoing aggressive stroke risk factor management Patient's family counseled to be compliant with his antithrombotic medications Patient's family counseled on Lifestyle modifications including, Diet, Exercise, and Stress Follow up with [facility] in 6 weeks  HX OF STROKES: previous L MCA stroke Baseline Findings: Expressive Aphasia  INTRACRANIAL Atherosclerosis &Stenosis: May consider DAPT prior to  discharge  DYSPHAGIA: NPO until passes SLP swallow evaluation Aspiration Precautions in progress  MEDICAL ISSUES:  ACUTE RESPIRATORY FAILURE: Venti Management per CCM team -  Appreciate assistance Remains intubated/sedated Extubate when medically stable Repeat CXR in AM +Resp Cx - IV Unasyn in progress  AFIB, CHRONIC: Decision to restart [facility] therapy after MRI images reviewed Metoprolol on Hold for now Admission INR subtherapeutic  Hyponatremia Hypokalemia Hypomagnesia Replacement in progress Repeat labs in AM  Thrombocytopenia Trend  Repeat labs in AM  Hypothryoidism Continue home dose synthroid Check TSH  HYPERTENSION: Stable SBP goal of < 180. DBP goal of < 105.  Nicardipine drip, Labetolol PRN Long term BP goal normotensive. May slowly restart home B/P medications after 48 hours Home Meds: On Hold: Metoprolol  HYPERLIPIDEMIA:    Component Value Date/Time   CHOL 174 06/20/2017 0331   CHOL 196 07/16/2012 0313   TRIG 50 06/20/2017 0331   TRIG 111 07/16/2012 0313   HDL 61 06/20/2017 0331   HDL 43 07/16/2012 0313   CHOLHDL 2.9 06/20/2017 0331   VLDL 10 06/20/2017 0331   VLDL 22 07/16/2012 0313   LDLCALC 103 (H) 06/20/2017 0331   LDLCALC 131 (H) 07/16/2012 0313  Home Meds:  NONE LDL  goal < 70 Started on Lipitor to 20 mg daily Continue statin at discharge  R/O DIABETES: Lab Results  Component Value Date   HGBA1C 5.4 06/20/2017  HgbA1c goal < 7.0  TOBACCO ABUSE Current smoker Smoking cessation counseling provided Nicotine patch provided  Other Active Problems: Active Problems:   Stroke (cerebrum) [facility])    Hospital day # 1 VTE prophylaxis: SCD's  Diet : Check puncture sites for bleeding or hematomas. Bleeding precautions Fall precautions Diet NPO time specified   FAMILY UPDATES: family at bedside  TEAM UPDATES: Micki Riley, MD   Prior Home Stroke Medications:  warfarin daily  Discharge Stroke Meds:  Please discharge patient on TBD     Disposition:  Therapy Recs:               PENDING Follow Up:  Follow-up Information    Micki Riley, MD. Schedule an appointment as soon as possible for a visit in 6 week(s).   Specialties:  Neurology, Radiology Contact information: [address] [place] [address] [phone]        Corky Downs, MD. Schedule an appointment as soon as possible for a visit in 1 week(s).   Specialty:  Internal Medicine Contact information: [address] [place] [address] [phone]          Corky Downs, MD -PCP Follow up in 1-2 weeks      Assessment & plan discussed with with attending physician and they are in agreement.    Beryl Meager, ANP-C Stroke Neurology Team 06/20/2017 4:27 PM I have personally examined this patient, reviewed notes, independently viewed imaging studies, participated in medical decision making and plan of care.ROS completed by me personally and pertinent positives fully documented  I have made any additions or clarifications directly to the above note. Agree with note above.  He presented with left hemiparesis secondary to right MCA branch infarct and received IV tPA and has shown some improvement. He was considered for mechanical thrombectomy but did not have lesion to treat. Recommend strict blood pressure control and close neurological monitoring as per post TPA protocol. Check MRI scan of the brain later today. Discontinue groin arterial catheter. Check swallow eval and start home medications. Long discussion with patient and daughter at the bedside and answered questions. This patient is critically ill and at significant risk of neurological worsening, death and care requires constant monitoring of  vital signs, hemodynamics,respiratory and cardiac monitoring, extensive review of multiple databases, frequent neurological assessment, discussion with family, other specialists and medical decision making of high complexity.I have made any  additions or clarifications directly to the above note.This critical care time does not reflect procedure time, or teaching time or supervisory time of PA/NP/Med Resident etc but could involve care discussion time.  I spent 30 minutes of neurocritical care time  in the care of  this patient.      Delia Heady, MD Medical Director [facility] Pager: [phone] 06/20/2017 5:12 PM  To contact Stroke Continuity provider, please refer to [email or web address]. After hours, contact General Neurology

## 2017-06-20 NOTE — Progress Notes (Signed)
  Echocardiogram 2D Echocardiogram has been performed.  Ronald [name] L Androw 06/20/2017, 11:30 AM

## 2017-06-20 NOTE — Plan of Care (Signed)
Daughter educated at bedside. Care plan updated. Dicie BeamFrazier, Miles Borkowski RN BSN.

## 2017-06-20 NOTE — Anesthesia Postprocedure Evaluation (Signed)
Anesthesia Post Note  Patient: Ronald [name]  Procedure(s) Performed: RADIOLOGY WITH ANESTHESIA (N/A )     Patient location during evaluation: ICU Anesthesia Type: General Level of consciousness: sedated Pain management: pain level controlled Vital Signs Assessment: post-procedure vital signs reviewed and stable Respiratory status: patient remains intubated per anesthesia plan Cardiovascular status: stable Postop Assessment: no apparent nausea or vomiting Anesthetic complications: no    Last Vitals:  Vitals:   06/20/17 0430 06/20/17 0500  BP: 120/73 112/62  Pulse: 90 79  Resp: 20 18  Temp:    SpO2: 96% 96%    Last Pain:  Vitals:   06/20/17 0400  TempSrc: Axillary  PainSc:                  Catheryn Baconyan P Tyjuan Demetro

## 2017-06-20 NOTE — Progress Notes (Signed)
OT Cancellation Note  Patient Details Name: Ronald [name] MRN: [id number] DOB: 12-Jul-1940   Cancelled Treatment:    Reason Eval/Treat Not Completed: Active bedrest order. Pt with order for strict bedrest. Will check back as appropriate.  Doristine Sectionharity A Tiari Andringa, MS OTR/L  Pager: [phone]   Reneta Niehaus A Milissa Fesperman 06/20/2017, 7:03 AM

## 2017-06-20 NOTE — Progress Notes (Signed)
On call note  Called by neuroradiologist - patient has a RMCA stroke, as had been expected based on exam and CT on arrival. Additionally has a small hematoma with no MLS or mass effect in the same territory.  SBP goal <140. Rest of the management per Stroke team. Communicated plan to patient's RN over the phone.  -- Milon DikesAshish Mycah Formica, MD [facility] Pager: [phone] If 7pm to 7am, please call on call as listed on AMION.

## 2017-06-20 NOTE — Progress Notes (Signed)
Upon arrival back from OR patients ABD severely distended. Had informed IR of patients urinary retention of 850mls before being sent to IR and need for foley but IR RN said they would not place foley per their protocol and left condom catheter on.  Bladder distention palpated up to level of umbilicus when patient returned.  Dr Merlene PullingHammonds made aware, assessed patient, and ordered foley catheter to be placed. 700mls of urine returned once foley placed.  Will continue to monitor.

## 2017-06-20 NOTE — Progress Notes (Signed)
Transported patient to radiology for MRI on monitor with oxygen. Patient assisted to MRI table. Patient stable at this time.

## 2017-06-20 NOTE — Progress Notes (Signed)
PULMONARY / CRITICAL CARE MEDICINE   Name: Ronald [name] MRN: [id number] DOB: 1940-12-29    ADMISSION DATE:  06/19/2017 CONSULTATION DATE:  2/19  REFERRING MD:  Dr. Amada Jupiter  CHIEF COMPLAINT:  CVA  HISTORY OF PRESENT ILLNESS:  Patient is encephalopathic and/or intubated. Therefore history has been obtained from chart review. 77 year old male with PMH as below, which is significant for COPD, HTN, Atrial fibrillation, and CVA. He was in his usual state of health until 5:30 PM on 2/18 when he began to not feel well during dinner. He attempted to ambulate to the bathroom but fell. EMS was called and noted him to be weak no the left side with a R sided gaze preference. CT of the head demonstrated no acute findings. He was given systemic tpa and admitted to the ICU. CTA did not demonstrate any clear benefit from IR intervention, however, his defects persisted and it was decided to proceed with angiogram. No intervention was performed during angiogram. Patient remained on ventilator post op and was transferred to ICU.    SUBJECTIVE:  No acute events. Tolerating PS at 5/5 well.  Not fully awake yet though.   VITAL SIGNS: BP (!) 105/45   Pulse 78   Temp 100 F (37.8 C) (Axillary) Comment: tylenol given  Resp 18   Ht 5\' 11"  (1.803 m)   Wt 72.5 kg (159 lb 13.3 oz)   SpO2 97%   BMI 22.29 kg/m   HEMODYNAMICS:    VENTILATOR SETTINGS: Vent Mode: PSV;CPAP FiO2 (%):  [21 %-40 %] 40 % Set Rate:  [18 bmp] 18 bmp Vt Set:  [600 mL] 600 mL PEEP:  [5 cmH20] 5 cmH20 Pressure Support:  [10 cmH20] 10 cmH20 Plateau Pressure:  [17 cmH20] 17 cmH20  INTAKE / OUTPUT: I/O last 3 completed shifts: In: 1989.4 [I.V.:1439.4; IV Piggyback:550] Out: 1605 [Urine:1600; Blood:5]  PHYSICAL EXAMINATION: General:  Chronically ill appearing barrel chested male on vent Neuro:  Somnolent.  Opens eyes to noxious stimuli but does not follow commands yet HEENT:  Apex/AT, PERRL, no JVD Cardiovascular:  IRIR, No  MRG Lungs:  CTAB Abdomen:  Soft, non-distended Musculoskeletal:  No acute deformity Skin:  Grossly intact  LABS:  BMET Recent Labs  Lab 06/19/17 1917 06/19/17 1922 06/20/17 0331  NA 135 137 134*  K 3.4* 3.4* 4.4  CL 100* 98* 103  CO2 24  --  21*  BUN 10 11 9   CREATININE 0.80 0.90 0.71  GLUCOSE 119* 111* 123*    Electrolytes Recent Labs  Lab 06/19/17 1917 06/20/17 0331  CALCIUM 8.9 8.6*  MG  --  1.6*  PHOS  --  3.2    CBC Recent Labs  Lab 06/19/17 1917 06/19/17 1922 06/20/17 0331  WBC 5.8  --  8.4  HGB 14.4 14.6 13.2  HCT 40.3 43.0 37.9*  PLT 146*  --  147*    Coag's Recent Labs  Lab 06/19/17 1917  APTT 32  INR 1.23    Sepsis Markers No results for input(s): LATICACIDVEN, PROCALCITON, O2SATVEN in the last 168 hours.  ABG Recent Labs  Lab 06/20/17 0124  PHART 7.297*  PCO2ART 46.7  PO2ART 82.0*    Liver Enzymes Recent Labs  Lab 06/19/17 1917  AST 30  ALT 19  ALKPHOS 43  BILITOT 0.6  ALBUMIN 3.8    Cardiac Enzymes No results for input(s): TROPONINI, PROBNP in the last 168 hours.  Glucose Recent Labs  Lab 06/19/17 1916 06/20/17 0020  GLUCAP 109* 86  Imaging Ct Angio Head W Or Wo Contrast  Result Date: 06/19/2017 CLINICAL DATA:  Code stroke presentation with left-sided weakness. EXAM: CT ANGIOGRAPHY HEAD AND NECK TECHNIQUE: Multidetector CT imaging of the head and neck was performed using the standard protocol during bolus administration of intravenous contrast. Multiplanar CT image reconstructions and MIPs were obtained to evaluate the vascular anatomy. Carotid stenosis measurements (when applicable) are obtained utilizing NASCET criteria, using the distal internal carotid diameter as the denominator. CONTRAST:  50 cc Isovue 370 COMPARISON:  Head CT earlier same day FINDINGS: CTA NECK FINDINGS Aortic arch: Aortic atherosclerosis but no aneurysm or dissection. Branching pattern of the brachiocephalic vessels from the arch is normal  without origin stenosis. Right carotid system: Common carotid artery widely patent to the bifurcation region. Atherosclerotic plaque at the carotid bifurcation and proximal ICA. Minimal diameter in the ICA bulb is 4.5 mm. Compared to a more distal cervical ICA diameter 4.5 mm, there is no stenosis. Left carotid system: Common carotid artery widely patent to the bifurcation. Calcified plaque at the carotid bifurcation and ICA bulb. Minimal diameter of the ICA bulb measures 3.2 mm. Compared to a more distal cervical ICA diameter of 4.5 mm, this indicates a 30-40% stenosis. Vertebral arteries: Right vertebral artery is dominant. Atherosclerotic calcification at the right vertebral artery origin with stenosis of 50%. Beyond that, the vessel is widely patent through the cervical region. Non dominant left vertebral artery shows calcified plaque at its origin with 50% stenosis. Beyond that, the vessel is patent through the cervical region to the foramen magnum. Skeleton: Degenerative spondylosis.  No bony canal stenosis. Other neck: No mass or lymphadenopathy. Upper chest: Mild emphysema and scarring. Review of the MIP images confirms the above findings CTA HEAD FINDINGS Anterior circulation: Both internal carotid arteries are patent through the skull base and siphon regions. There is siphon atherosclerotic calcification maximal stenosis estimated at 30%. The A1 and M1 segments are patent on both sides. There is moderate stenosis in the right A1 segment. No M2 occlusion seen on the left. On the right, there appears to be occlusion of an anterior temporal M2 branch. There are serial stenoses in the right anterior cerebral artery. Posterior circulation: Dominant right vertebral artery shows 30% stenosis in the V4 segment but is patent to the basilar. Non dominant left vertebral artery shows 50% stenosis in the V4 segment but is patent to the basilar. No basilar stenosis. The basilar artery does show some atherosclerotic  irregularity. Superior cerebellar and posterior cerebral arteries show flow. Patent posterior communicating artery on the right. Venous sinuses: Patent and normal. Limited opacification due to early face scanning. Anatomic variants: None significant. Delayed phase: No abnormal enhancement. Review of the MIP images confirms the above findings IMPRESSION: Diffuse intracranial atherosclerotic disease. Apparent occlusion of a right insular/anterior temporal M2 branch. Serial severe stenoses in the right anterior cerebral artery. Atherosclerotic irregularity in both A1 segments. Atherosclerotic irregularity of the basilar artery without flow limiting stenosis. Aortic atherosclerosis. Carotid bifurcation atherosclerosis without stenosis. 50% vertebral artery origin stenoses. 30-50% V4 stenoses. Electronically Signed   By: Paulina Fusi M.D.   On: 06/19/2017 20:01   Ct Angio Neck W Or Wo Contrast  Result Date: 06/19/2017 CLINICAL DATA:  Code stroke presentation with left-sided weakness. EXAM: CT ANGIOGRAPHY HEAD AND NECK TECHNIQUE: Multidetector CT imaging of the head and neck was performed using the standard protocol during bolus administration of intravenous contrast. Multiplanar CT image reconstructions and MIPs were obtained to evaluate the vascular anatomy. Carotid  stenosis measurements (when applicable) are obtained utilizing NASCET criteria, using the distal internal carotid diameter as the denominator. CONTRAST:  50 cc Isovue 370 COMPARISON:  Head CT earlier same day FINDINGS: CTA NECK FINDINGS Aortic arch: Aortic atherosclerosis but no aneurysm or dissection. Branching pattern of the brachiocephalic vessels from the arch is normal without origin stenosis. Right carotid system: Common carotid artery widely patent to the bifurcation region. Atherosclerotic plaque at the carotid bifurcation and proximal ICA. Minimal diameter in the ICA bulb is 4.5 mm. Compared to a more distal cervical ICA diameter 4.5 mm, there  is no stenosis. Left carotid system: Common carotid artery widely patent to the bifurcation. Calcified plaque at the carotid bifurcation and ICA bulb. Minimal diameter of the ICA bulb measures 3.2 mm. Compared to a more distal cervical ICA diameter of 4.5 mm, this indicates a 30-40% stenosis. Vertebral arteries: Right vertebral artery is dominant. Atherosclerotic calcification at the right vertebral artery origin with stenosis of 50%. Beyond that, the vessel is widely patent through the cervical region. Non dominant left vertebral artery shows calcified plaque at its origin with 50% stenosis. Beyond that, the vessel is patent through the cervical region to the foramen magnum. Skeleton: Degenerative spondylosis.  No bony canal stenosis. Other neck: No mass or lymphadenopathy. Upper chest: Mild emphysema and scarring. Review of the MIP images confirms the above findings CTA HEAD FINDINGS Anterior circulation: Both internal carotid arteries are patent through the skull base and siphon regions. There is siphon atherosclerotic calcification maximal stenosis estimated at 30%. The A1 and M1 segments are patent on both sides. There is moderate stenosis in the right A1 segment. No M2 occlusion seen on the left. On the right, there appears to be occlusion of an anterior temporal M2 branch. There are serial stenoses in the right anterior cerebral artery. Posterior circulation: Dominant right vertebral artery shows 30% stenosis in the V4 segment but is patent to the basilar. Non dominant left vertebral artery shows 50% stenosis in the V4 segment but is patent to the basilar. No basilar stenosis. The basilar artery does show some atherosclerotic irregularity. Superior cerebellar and posterior cerebral arteries show flow. Patent posterior communicating artery on the right. Venous sinuses: Patent and normal. Limited opacification due to early face scanning. Anatomic variants: None significant. Delayed phase: No abnormal  enhancement. Review of the MIP images confirms the above findings IMPRESSION: Diffuse intracranial atherosclerotic disease. Apparent occlusion of a right insular/anterior temporal M2 branch. Serial severe stenoses in the right anterior cerebral artery. Atherosclerotic irregularity in both A1 segments. Atherosclerotic irregularity of the basilar artery without flow limiting stenosis. Aortic atherosclerosis. Carotid bifurcation atherosclerosis without stenosis. 50% vertebral artery origin stenoses. 30-50% V4 stenoses. Electronically Signed   By: Paulina Fusi M.D.   On: 06/19/2017 20:01   Ct Cerebral Perfusion W Contrast  Result Date: 06/19/2017 CLINICAL DATA:  Left-sided weakness. EXAM: CT PERFUSION BRAIN TECHNIQUE: Multiphase CT imaging of the brain was performed following IV bolus contrast injection. Subsequent parametric perfusion maps were calculated using RAPID software. CONTRAST:  40 cc Isovue 370 COMPARISON:  CT angiography and CT same day. FINDINGS: CT Brain Perfusion Findings: CBF (<30%) Volume: 24mL, primarily in the inferior right temporal lobe and posterior inferior right parietal lobe. Perfusion (Tmax>6.0s) volume: , with the mismatch volume being primarily in the deep insular region on the right with some additional brain along the margins of the right parietal CBF less than 30% region. Mismatch Volume: 82 mL. This may be slightly overstated based on inclusion  of some brain in the region of the old left posterior frontal infarction. Infarction Location:Right MCA territory The examination does not have any significant motion degradation. Hypoperfusion index is 0.4, an unfavorable finding. There does not appear to be vigorous collateral supply. IMPRESSION: Acute changes in the right MCA territory with likely completed infarction affecting the right inferior temporal lobe and right inferior parietal lobe with potentially salvageable brain in the insular region and along the margins of the likely  completed right parietal infarction. Electronically Signed   By: Paulina Fusi M.D.   On: 06/19/2017 20:22   Portable Chest X-ray  Result Date: 06/20/2017 CLINICAL DATA:  Endotracheal tube present. EXAM: PORTABLE CHEST 1 VIEW COMPARISON:  Radiograph yesterday at 2040 hour FINDINGS: Endotracheal tube in place, tip at the level of the clavicular heads. Patient is post median sternotomy. Cardiomegaly is unchanged. Developing fluffy left greater than right perihilar opacities. No large pleural effusion or pneumothorax. IMPRESSION: 1. Tip of the endotracheal tube at the thoracic inlet. 2. Developing fluffy perihilar opacities, left greater than right, suspicious for pulmonary edema. Cardiomegaly is unchanged. Electronically Signed   By: Rubye Oaks M.D.   On: 06/20/2017 01:06   Dg Chest Portable 1 View  Result Date: 06/19/2017 CLINICAL DATA:  Dyspnea.  History of emphysema EXAM: PORTABLE CHEST 1 VIEW COMPARISON:  03/07/2016 FINDINGS: The heart is enlarged but stable. 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 Low lung volumes with mild vascular crowding and streaky areas of atelectasis but no definite infiltrates, edema or effusions. The bony thorax is intact. IMPRESSION: Stable cardiac enlargement. Low lung volumes with vascular crowding and streaky atelectasis. Electronically Signed   By: Rudie Meyer M.D.   On: 06/19/2017 21:08   Ct Head Code Stroke Wo Contrast  Result Date: 06/19/2017 CLINICAL DATA:  Code stroke. Left-sided weakness. Acute presentation. EXAM: CT HEAD WITHOUT CONTRAST TECHNIQUE: Contiguous axial images were obtained from the base of the skull through the vertex without intravenous contrast. COMPARISON:  MRI 07/17/2012 FINDINGS: Brain: No abnormality is seen affecting the brainstem or cerebellum. Right cerebral hemisphere shows chronic small-vessel changes of the white matter without evidence of acute infarction. Left hemisphere shows an old insular to posterior frontal cortical and  subcortical infarction with encephalomalacia. Chronic small-vessel changes of the white matter. No sign of acute infarction. No mass lesion, hemorrhage, hydrocephalus or extra-axial collection. Vascular: There is atherosclerotic calcification of the major vessels at the base of the brain. Skull: Negative Sinuses/Orbits: No significant sinus finding.  Orbits negative. Other: None ASPECTS (Alberta Stroke Program Early CT Score) - Ganglionic level infarction (caudate, lentiform nuclei, internal capsule, insula, M1-M3 cortex): 7 - Supraganglionic infarction (M4-M6 cortex): 3 Total score (0-10 with 10 being normal): 10 IMPRESSION: 1. No acute finding. Chronic small-vessel changes of the white matter. 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 2. ASPECTS is 10 3. These results were communicated to Dr. Amada Jupiter at 7:29 pmon 2/18/2019by text page via the [facility] messaging system. Electronically Signed   By: Paulina Fusi M.D.   On: 06/19/2017 19:30     STUDIES:  CT head 2/18 > No acute finding. Chronic small-vessel changes of the white matter. 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 CTA head/neck 2/18 > Diffuse intracranial atherosclerotic disease. Apparent occlusion of a right insular/anterior temporal M2 branch. Serial severe stenoses in the right anterior cerebral artery. Atherosclerotic irregularity in both A1 segments. Atherosclerotic irregularity of the basilar artery without flow limiting stenosis.  Aortic atherosclerosis. Carotid bifurcation atherosclerosis without stenosis. 50% vertebral artery origin stenoses. 30-50%  V4 stenoses.  CULTURES: None.  ANTIBIOTICS: None.  SIGNIFICANT EVENTS: 2/18 > admit  LINES/TUBES: ETT 2/18 >  DISCUSSION: 77 year old male with COPD and past CVA admitted again with CVA. Systemic tpa given in ED, however, symptoms did not improve as expected. He was taken to IR for arteriogram, no therapeutic intervention done. Post op her remained on vent in ICU.   ASSESSMENT /  PLAN:  PULMONARY A: Inability to protect airway in post-operative setting COPD without acute exacerbation P:   Full vent support SBT this AM Bronchial hygiene Continue bronchodilators Follow CXR  CARDIOVASCULAR A:  Atrial fibrillation HTN P:  Telemetry monitoring Anticoagulation per neurology BP goal per neurology Holding metoprolol  RENAL A:   Hypokalemia - resolved Hypomagnesemia P:   2g mag Follow BMP  GASTROINTESTINAL A:   Nutrition P:   NPO PPI  HEMATOLOGIC A:   Subtherapeutic INR P:  Anticoagulation per neurology  INFECTIOUS A:   No acute issues P:   Monitor clinically  ENDOCRINE A:   Hypothryoid P:   Follow glucose on chemistry Continue synthroid  NEUROLOGIC A:   CVA s/p systemic TPA and arteriogram with no intervention.  P:   RASS goal: -1 to -2 Fentanyl infusion, PRN versed  Per neurology   FAMILY  - Updates:  Daughter updated at bedside.  - Inter-disciplinary family meet or Palliative Care meeting due by:  2/25  CC time: 30 min.   Rutherford Guys, [place] - [facility] [place] [facility] Pager: [phone]  or [phone] 06/20/2017, 8:41 AM

## 2017-06-20 NOTE — Progress Notes (Signed)
PT Cancellation Note  Patient Details Name: Ronald [name] MRN: [id number] DOB: 12-31-1940   Cancelled Treatment:    Reason Eval/Treat Not Completed: Patient not medically ready, bedrest at this time   Fabio AsaDevon J Shakthi Scipio 06/20/2017, 11:32 AM Charlotte Crumbevon Veralyn Lopp, PT DPT  Board Certified Neurologic Specialist [phone]

## 2017-06-20 NOTE — Progress Notes (Signed)
SLP Cancellation Note  Patient Details Name: Ronald [name] MRN: [id number] DOB: 30-Jul-1940   Cancelled treatment:       Reason Eval/Treat Not Completed: Patient not medically ready. Per chart review pt is intubated/sedated. Will f/u for readiness.   Ronald [name], Ronald [name] 06/20/2017, 8:36 AM  Ronald [name], M.A. CCC-SLP [phone]

## 2017-06-20 NOTE — Consult Note (Signed)
PULMONARY / CRITICAL CARE MEDICINE   Name: Ronald [name] MRN: [id number] DOB: 14-Mar-1941    ADMISSION DATE:  06/19/2017 CONSULTATION DATE:  2/19  REFERRING MD:  Dr. Amada Jupiter  CHIEF COMPLAINT:  CVA  HISTORY OF PRESENT ILLNESS:  Patient is encephalopathic and/or intubated. Therefore history has been obtained from chart review. 77 year old male with PMH as below, which is significant for COPD, HTN, Atrial fibrillation, and CVA. He was in his usual state of health until 5:30 PM on 2/18 when he began to not feel well during dinner. He attempted to ambulate to the bathroom but fell. EMS was called and noted him to be weak no the left side with a R sided gaze preference. CT of the head demonstrated no acute findings. He was given systemic tpa and admitted to the ICU. CTA did not demonstrate any clear benefit from IR intervention, however, his defects persisted and it was decided to proceed with angiogram. No intervention was performed during angiogram. Patient remained on ventilator post op and was transferred to ICU.   PAST MEDICAL HISTORY :  He  has a past medical history of COPD (chronic obstructive pulmonary disease) (HCC), Emphysema lung (HCC), long term use of blood thinners, Hypertension, Stroke (HCC) (2013), and Thyroid disease.  PAST SURGICAL HISTORY: He  has no past surgical history on file.  No Known Allergies  No current facility-administered medications on file prior to encounter.    Current Outpatient Medications on File Prior to Encounter  Medication Sig  . Aspirin-Salicylamide-Caffeine (ARTHRITIS STRENGTH BC POWDER PO) Take 1 packet by mouth every hour.  . gabapentin (NEURONTIN) 100 MG capsule Take 100 mg by mouth 3 (three) times daily as needed (back pain/spasms).   Marland Kitchen levothyroxine (SYNTHROID, LEVOTHROID) 125 MCG tablet Take 125 mcg by mouth daily after supper.   . metoprolol tartrate (LOPRESSOR) 50 MG tablet Take 50 mg by mouth daily after supper.   . warfarin (COUMADIN) 5  MG tablet Take 5 mg by mouth daily after supper.     FAMILY HISTORY:  His has no family status information on file.    SOCIAL HISTORY: He  reports that he has been smoking cigarettes.  He has been smoking about 1.00 pack per day. He does not have any smokeless tobacco history on file. He reports that he drinks alcohol. He reports that he does not use drugs.  REVIEW OF SYSTEMS:   unable  SUBJECTIVE:  unable  VITAL SIGNS: BP (!) 168/87   Pulse (!) 128   Temp 97.8 F (36.6 C) (Oral)   Resp (!) 24   Ht 5\' 11"  (1.803 m)   Wt 72.5 kg (159 lb 13.3 oz)   SpO2 93%   BMI 22.29 kg/m   HEMODYNAMICS:    VENTILATOR SETTINGS: FiO2 (%):  [21 %] 21 %  INTAKE / OUTPUT: No intake/output data recorded.  PHYSICAL EXAMINATION: General:  Chronically ill appearing barrel chested male on vent Neuro:  Sedated (received neuromuscular blockade within the last 20 minutes)  HEENT:  [place]/AT, PERRL, no JVD Cardiovascular:  IRIR, AF on tele. No MRG Lungs:  Coarse bilateral breath sounds. L > R.  Abdomen:  Soft, non-distended Musculoskeletal:  No acute deformity Skin:  Grossly intact.   LABS:  BMET Recent Labs  Lab 06/19/17 1917 06/19/17 1922  NA 135 137  K 3.4* 3.4*  CL 100* 98*  CO2 24  --   BUN 10 11  CREATININE 0.80 0.90  GLUCOSE 119* 111*    Electrolytes  Recent Labs  Lab 06/19/17 1917  CALCIUM 8.9    CBC Recent Labs  Lab 06/19/17 1917 06/19/17 1922  WBC 5.8  --   HGB 14.4 14.6  HCT 40.3 43.0  PLT 146*  --     Coag's Recent Labs  Lab 06/19/17 1917  APTT 32  INR 1.23    Sepsis Markers No results for input(s): LATICACIDVEN, PROCALCITON, O2SATVEN in the last 168 hours.  ABG No results for input(s): PHART, PCO2ART, PO2ART in the last 168 hours.  Liver Enzymes Recent Labs  Lab 06/19/17 1917  AST 30  ALT 19  ALKPHOS 43  BILITOT 0.6  ALBUMIN 3.8    Cardiac Enzymes No results for input(s): TROPONINI, PROBNP in the last 168 hours.  Glucose Recent  Labs  Lab 06/19/17 1916  GLUCAP 109*    Imaging Ct Angio Head W Or Wo Contrast  Result Date: 06/19/2017 CLINICAL DATA:  Code stroke presentation with left-sided weakness. EXAM: CT ANGIOGRAPHY HEAD AND NECK TECHNIQUE: Multidetector CT imaging of the head and neck was performed using the standard protocol during bolus administration of intravenous contrast. Multiplanar CT image reconstructions and MIPs were obtained to evaluate the vascular anatomy. Carotid stenosis measurements (when applicable) are obtained utilizing NASCET criteria, using the distal internal carotid diameter as the denominator. CONTRAST:  50 cc Isovue 370 COMPARISON:  Head CT earlier same day FINDINGS: CTA NECK FINDINGS Aortic arch: Aortic atherosclerosis but no aneurysm or dissection. Branching pattern of the brachiocephalic vessels from the arch is normal without origin stenosis. Right carotid system: Common carotid artery widely patent to the bifurcation region. Atherosclerotic plaque at the carotid bifurcation and proximal ICA. Minimal diameter in the ICA bulb is 4.5 mm. Compared to a more distal cervical ICA diameter 4.5 mm, there is no stenosis. Left carotid system: Common carotid artery widely patent to the bifurcation. Calcified plaque at the carotid bifurcation and ICA bulb. Minimal diameter of the ICA bulb measures 3.2 mm. Compared to a more distal cervical ICA diameter of 4.5 mm, this indicates a 30-40% stenosis. Vertebral arteries: Right vertebral artery is dominant. Atherosclerotic calcification at the right vertebral artery origin with stenosis of 50%. Beyond that, the vessel is widely patent through the cervical region. Non dominant left vertebral artery shows calcified plaque at its origin with 50% stenosis. Beyond that, the vessel is patent through the cervical region to the foramen magnum. Skeleton: Degenerative spondylosis.  No bony canal stenosis. Other neck: No mass or lymphadenopathy. Upper chest: Mild emphysema and  scarring. Review of the MIP images confirms the above findings CTA HEAD FINDINGS Anterior circulation: Both internal carotid arteries are patent through the skull base and siphon regions. There is siphon atherosclerotic calcification maximal stenosis estimated at 30%. The A1 and M1 segments are patent on both sides. There is moderate stenosis in the right A1 segment. No M2 occlusion seen on the left. On the right, there appears to be occlusion of an anterior temporal M2 branch. There are serial stenoses in the right anterior cerebral artery. Posterior circulation: Dominant right vertebral artery shows 30% stenosis in the V4 segment but is patent to the basilar. Non dominant left vertebral artery shows 50% stenosis in the V4 segment but is patent to the basilar. No basilar stenosis. The basilar artery does show some atherosclerotic irregularity. Superior cerebellar and posterior cerebral arteries show flow. Patent posterior communicating artery on the right. Venous sinuses: Patent and normal. Limited opacification due to early face scanning. Anatomic variants: None significant. Delayed phase: No  abnormal enhancement. Review of the MIP images confirms the above findings IMPRESSION: Diffuse intracranial atherosclerotic disease. Apparent occlusion of a right insular/anterior temporal M2 branch. Serial severe stenoses in the right anterior cerebral artery. Atherosclerotic irregularity in both A1 segments. Atherosclerotic irregularity of the basilar artery without flow limiting stenosis. Aortic atherosclerosis. Carotid bifurcation atherosclerosis without stenosis. 50% vertebral artery origin stenoses. 30-50% V4 stenoses. Electronically Signed   By: Paulina Fusi M.D.   On: 06/19/2017 20:01   Ct Angio Neck W Or Wo Contrast  Result Date: 06/19/2017 CLINICAL DATA:  Code stroke presentation with left-sided weakness. EXAM: CT ANGIOGRAPHY HEAD AND NECK TECHNIQUE: Multidetector CT imaging of the head and neck was performed  using the standard protocol during bolus administration of intravenous contrast. Multiplanar CT image reconstructions and MIPs were obtained to evaluate the vascular anatomy. Carotid stenosis measurements (when applicable) are obtained utilizing NASCET criteria, using the distal internal carotid diameter as the denominator. CONTRAST:  50 cc Isovue 370 COMPARISON:  Head CT earlier same day FINDINGS: CTA NECK FINDINGS Aortic arch: Aortic atherosclerosis but no aneurysm or dissection. Branching pattern of the brachiocephalic vessels from the arch is normal without origin stenosis. Right carotid system: Common carotid artery widely patent to the bifurcation region. Atherosclerotic plaque at the carotid bifurcation and proximal ICA. Minimal diameter in the ICA bulb is 4.5 mm. Compared to a more distal cervical ICA diameter 4.5 mm, there is no stenosis. Left carotid system: Common carotid artery widely patent to the bifurcation. Calcified plaque at the carotid bifurcation and ICA bulb. Minimal diameter of the ICA bulb measures 3.2 mm. Compared to a more distal cervical ICA diameter of 4.5 mm, this indicates a 30-40% stenosis. Vertebral arteries: Right vertebral artery is dominant. Atherosclerotic calcification at the right vertebral artery origin with stenosis of 50%. Beyond that, the vessel is widely patent through the cervical region. Non dominant left vertebral artery shows calcified plaque at its origin with 50% stenosis. Beyond that, the vessel is patent through the cervical region to the foramen magnum. Skeleton: Degenerative spondylosis.  No bony canal stenosis. Other neck: No mass or lymphadenopathy. Upper chest: Mild emphysema and scarring. Review of the MIP images confirms the above findings CTA HEAD FINDINGS Anterior circulation: Both internal carotid arteries are patent through the skull base and siphon regions. There is siphon atherosclerotic calcification maximal stenosis estimated at 30%. The A1 and M1  segments are patent on both sides. There is moderate stenosis in the right A1 segment. No M2 occlusion seen on the left. On the right, there appears to be occlusion of an anterior temporal M2 branch. There are serial stenoses in the right anterior cerebral artery. Posterior circulation: Dominant right vertebral artery shows 30% stenosis in the V4 segment but is patent to the basilar. Non dominant left vertebral artery shows 50% stenosis in the V4 segment but is patent to the basilar. No basilar stenosis. The basilar artery does show some atherosclerotic irregularity. Superior cerebellar and posterior cerebral arteries show flow. Patent posterior communicating artery on the right. Venous sinuses: Patent and normal. Limited opacification due to early face scanning. Anatomic variants: None significant. Delayed phase: No abnormal enhancement. Review of the MIP images confirms the above findings IMPRESSION: Diffuse intracranial atherosclerotic disease. Apparent occlusion of a right insular/anterior temporal M2 branch. Serial severe stenoses in the right anterior cerebral artery. Atherosclerotic irregularity in both A1 segments. Atherosclerotic irregularity of the basilar artery without flow limiting stenosis. Aortic atherosclerosis. Carotid bifurcation atherosclerosis without stenosis. 50% vertebral artery origin stenoses.  30-50% V4 stenoses. Electronically Signed   By: Paulina Fusi M.D.   On: 06/19/2017 20:01   Ct Cerebral Perfusion W Contrast  Result Date: 06/19/2017 CLINICAL DATA:  Left-sided weakness. EXAM: CT PERFUSION BRAIN TECHNIQUE: Multiphase CT imaging of the brain was performed following IV bolus contrast injection. Subsequent parametric perfusion maps were calculated using RAPID software. CONTRAST:  40 cc Isovue 370 COMPARISON:  CT angiography and CT same day. FINDINGS: CT Brain Perfusion Findings: CBF (<30%) Volume: 24mL, primarily in the inferior right temporal lobe and posterior inferior right parietal  lobe. Perfusion (Tmax>6.0s) volume: , with the mismatch volume being primarily in the deep insular region on the right with some additional brain along the margins of the right parietal CBF less than 30% region. Mismatch Volume: 82 mL. This may be slightly overstated based on inclusion of some brain in the region of the old left posterior frontal infarction. Infarction Location:Right MCA territory The examination does not have any significant motion degradation. Hypoperfusion index is 0.4, an unfavorable finding. There does not appear to be vigorous collateral supply. IMPRESSION: Acute changes in the right MCA territory with likely completed infarction affecting the right inferior temporal lobe and right inferior parietal lobe with potentially salvageable brain in the insular region and along the margins of the likely completed right parietal infarction. Electronically Signed   By: Paulina Fusi M.D.   On: 06/19/2017 20:22   Dg Chest Portable 1 View  Result Date: 06/19/2017 CLINICAL DATA:  Dyspnea.  History of emphysema EXAM: PORTABLE CHEST 1 VIEW COMPARISON:  03/07/2016 FINDINGS: The heart is enlarged but stable. 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 Low lung volumes with mild vascular crowding and streaky areas of atelectasis but no definite infiltrates, edema or effusions. The bony thorax is intact. IMPRESSION: Stable cardiac enlargement. Low lung volumes with vascular crowding and streaky atelectasis. Electronically Signed   By: Rudie Meyer M.D.   On: 06/19/2017 21:08   Ct Head Code Stroke Wo Contrast  Result Date: 06/19/2017 CLINICAL DATA:  Code stroke. Left-sided weakness. Acute presentation. EXAM: CT HEAD WITHOUT CONTRAST TECHNIQUE: Contiguous axial images were obtained from the base of the skull through the vertex without intravenous contrast. COMPARISON:  MRI 07/17/2012 FINDINGS: Brain: No abnormality is seen affecting the brainstem or cerebellum. Right cerebral hemisphere shows chronic  small-vessel changes of the white matter without evidence of acute infarction. Left hemisphere shows an old insular to posterior frontal cortical and subcortical infarction with encephalomalacia. Chronic small-vessel changes of the white matter. No sign of acute infarction. No mass lesion, hemorrhage, hydrocephalus or extra-axial collection. Vascular: There is atherosclerotic calcification of the major vessels at the base of the brain. Skull: Negative Sinuses/Orbits: No significant sinus finding.  Orbits negative. Other: None ASPECTS (Alberta Stroke Program Early CT Score) - Ganglionic level infarction (caudate, lentiform nuclei, internal capsule, insula, M1-M3 cortex): 7 - Supraganglionic infarction (M4-M6 cortex): 3 Total score (0-10 with 10 being normal): 10 IMPRESSION: 1. No acute finding. Chronic small-vessel changes of the white matter. 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 2. ASPECTS is 10 3. These results were communicated to Dr. Amada Jupiter at 7:29 pmon 2/18/2019by text page via the [facility] messaging system. Electronically Signed   By: Paulina Fusi M.D.   On: 06/19/2017 19:30     STUDIES:  CT head 2/18 > No acute finding. Chronic small-vessel changes of the white matter. 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 CTA head/neck 2/18 > Diffuse intracranial atherosclerotic disease. Apparent occlusion of a right insular/anterior temporal M2 branch.  Serial severe stenoses in the right anterior cerebral artery. Atherosclerotic irregularity in both A1 segments. Atherosclerotic irregularity of the basilar artery without flow limiting stenosis.  Aortic atherosclerosis. Carotid bifurcation atherosclerosis without stenosis. 50% vertebral artery origin stenoses. 30-50% V4 stenoses.  CULTURES:   ANTIBIOTICS:   SIGNIFICANT EVENTS:   LINES/TUBES: ETT 2/18 >  DISCUSSION: 77 year old male with COPD and past CVA admitted again with CVA. Systemic tpa given in ED, however, symptoms did not improve as expected.  He was taken to IR for arteriogram, no therapeutic intervention done. Post op her remained on vent in ICU.   ASSESSMENT / PLAN:  PULMONARY A: Inability to protect airway in post-operative setting.  COPD without acute exacerbation  P:   Full vent support CXR for tube placement ABG VAP bundle Scheduled bronchodilators  CARDIOVASCULAR A:  Atrial fibrillation HTN  P:  Telemetry monitoring Anticoagulation per neurology BP goal per neurology Holding metoprolol  RENAL A:   Hypokalemia  P:   mointor  GASTROINTESTINAL A:   No acute issues  P:   NPO PPI  HEMATOLOGIC A:   Subtherapeutic INR  P:  Anticoagulation per neurology  INFECTIOUS A:   No acute issues  P:   Follow WBC and fever curve CXR to ensure no aspiration event.   ENDOCRINE A:   Hypothryoid  P:   Follow glucose on chemistry Continue synthroid with IV conversion.   NEUROLOGIC A:   CVA s/p systemic TPA and arteriogram with no intervention.   P:   RASS goal: -1 to -2 Fentanyl infusion, PRN versed Per neurology   FAMILY  - Updates:   - Inter-disciplinary family meet or Palliative Care meeting due by:  2/25   Joneen Roach, AGACNP-BC [facility] Pager [phone] or [phone]  06/20/2017 12:34 AM

## 2017-06-20 NOTE — Transfer of Care (Signed)
Immediate Anesthesia Transfer of Care Note  Patient: Ronald [name]  Procedure(s) Performed: RADIOLOGY WITH ANESTHESIA (N/A )  Patient Location: ICU  Anesthesia Type:General  Level of Consciousness: sedated and Patient remains intubated per anesthesia plan  Airway & Oxygen Therapy: Patient remains intubated per anesthesia plan and Patient placed on Ventilator (see vital sign flow sheet for setting)  Post-op Assessment: Report given to RN and Post -op Vital signs reviewed and stable  Post vital signs: Reviewed and stable  Last Vitals:  Vitals:   06/19/17 2245 06/19/17 2300  BP: (!) 178/94 (!) 168/87  Pulse: (!) 105 (!) 128  Resp: (!) 21 (!) 24  Temp:    SpO2: 96% 93%    Last Pain:  Vitals:   06/19/17 2130  TempSrc: Oral  PainSc:          Complications: No apparent anesthesia complications

## 2017-06-21 ENCOUNTER — Encounter (HOSPITAL_COMMUNITY): Payer: Self-pay | Admitting: Interventional Radiology

## 2017-06-21 DIAGNOSIS — I6300 Cerebral infarction due to thrombosis of unspecified precerebral artery: Secondary | ICD-10-CM

## 2017-06-21 LAB — PHOSPHORUS: PHOSPHORUS: 2.2 mg/dL — AB (ref 2.5–4.6)

## 2017-06-21 LAB — BASIC METABOLIC PANEL
Anion gap: 8 (ref 5–15)
BUN: 7 mg/dL (ref 6–20)
CALCIUM: 8.7 mg/dL — AB (ref 8.9–10.3)
CHLORIDE: 105 mmol/L (ref 101–111)
CO2: 24 mmol/L (ref 22–32)
CREATININE: 0.64 mg/dL (ref 0.61–1.24)
GFR calc non Af Amer: >60 mL/min (ref >60)
GLUCOSE: 112 mg/dL — AB (ref 65–99)
Potassium: 4 mmol/L (ref 3.5–5.1)
Sodium: 137 mmol/L (ref 135–145)

## 2017-06-21 LAB — MAGNESIUM: Magnesium: 2 mg/dL (ref 1.7–2.4)

## 2017-06-21 LAB — CBC
HEMATOCRIT: 39.1 % (ref 39.0–52.0)
HEMOGLOBIN: 13.5 g/dL (ref 13.0–17.0)
MCH: 35.2 pg — AB (ref 26.0–34.0)
MCHC: 34.5 g/dL (ref 30.0–36.0)
MCV: 101.8 fL — AB (ref 78.0–100.0)
Platelets: 135 10*3/uL — ABNORMAL LOW (ref 150–400)
RBC: 3.84 MIL/uL — ABNORMAL LOW (ref 4.22–5.81)
RDW: 14.2 % (ref 11.5–15.5)
WBC: 8.9 10*3/uL (ref 4.0–10.5)

## 2017-06-21 LAB — TSH: TSH: 1.117 u[IU]/mL (ref 0.350–4.500)

## 2017-06-21 MED ORDER — GUAIFENESIN 100 MG/5ML PO SOLN
5.0000 mL | ORAL | Status: DC
  Administered 2017-06-23: 100 mg
  Filled 2017-06-21 (×2): qty 5

## 2017-06-21 MED ORDER — LABETALOL HCL 5 MG/ML IV SOLN
10.0000 mg | INTRAVENOUS | Status: DC | PRN
  Administered 2017-06-21 – 2017-06-22 (×3): 10 mg via INTRAVENOUS
  Filled 2017-06-21 (×2): qty 4

## 2017-06-21 MED ORDER — ACETYLCYSTEINE 20 % IN SOLN
2.0000 mL | Freq: Two times a day (BID) | RESPIRATORY_TRACT | Status: DC
  Administered 2017-06-21: 2 mL via RESPIRATORY_TRACT
  Filled 2017-06-21 (×3): qty 4

## 2017-06-21 MED ORDER — ACETYLCYSTEINE 10% NICU INHALATION SOLUTION
2.0000 mL | Freq: Two times a day (BID) | RESPIRATORY_TRACT | Status: DC
  Filled 2017-06-21: qty 2

## 2017-06-21 MED ORDER — K PHOS MONO-SOD PHOS DI & MONO 155-852-130 MG PO TABS
500.0000 mg | ORAL_TABLET | Freq: Three times a day (TID) | ORAL | Status: DC
  Filled 2017-06-21 (×6): qty 2

## 2017-06-21 NOTE — Evaluation (Signed)
Clinical/Bedside Swallow Evaluation Patient Details  Name: Ronald [name] MRN: [id number] Date of Birth: 1941/03/07  Today's Date: 06/21/2017 Time: SLP Start Time (ACUTE ONLY): 0947 SLP Stop Time (ACUTE ONLY): 1010 SLP Time Calculation (min) (ACUTE ONLY): 23 min  Past Medical History:  Past Medical History:  Diagnosis Date  . COPD (chronic obstructive pulmonary disease) (HCC)   . Emphysema lung (HCC)   . Hx of long term use of blood thinners   . Hypertension   . Stroke [facility]) 2013  . Thyroid disease    hypothyroidism   Past Surgical History:  Past Surgical History:  Procedure Laterality Date  . IR ANGIO EXTRACRAN SEL COM CAROTID INNOMINATE UNI R MOD SED  06/19/2017  . IR ANGIO VERTEBRAL SEL SUBCLAVIAN INNOMINATE UNI R MOD SED  06/19/2017  . RADIOLOGY WITH ANESTHESIA N/A 06/19/2017   Procedure: RADIOLOGY WITH ANESTHESIA;  Surgeon: Julieanne Cotton, MD;  Location: [facility] OR;  Service: Radiology;  Laterality: N/A;   HPI:  Ronald L Burnsis a 77 y.o.malewith PMH of AFIB on Coumadin with subtherapeutic INR, Hx of CVA, HTN and Hypothyroidism who presentswith acute left-sided weakness and questionable M2 occlusion with improvement following IV TPA.Large region of acute/early subacute infarction involving right MCA and PCA territories. Involvement of right MCA and PCA   Assessment / Plan / Recommendation Clinical Impression  Bedside swallow evaluation complete. Patient with severe oropharyngeal dysphagia with gross left sided oral weakness due to impaired CN VII and XII and poor secretion management. Significant wet vocal quality with inability to clear secretions despite max cueing limiting ability to trial pos today. SLP will continue to f/u for diagnostic po trials.    Aspiration Risk  Severe aspiration risk    Diet Recommendation NPO;Alternative means - temporary   Medication Administration: Via alternative means    Other  Recommendations Oral Care Recommendations: Oral care QID    Follow up Recommendations Inpatient Rehab      Frequency and Duration min 3x week  2 weeks       Prognosis Prognosis for Safe Diet Advancement: Fair Barriers to Reach Goals: Severity of deficits      Swallow Study   General HPI: Ronald L Burnsis a 77 y.o.malewith PMH of AFIB on Coumadin with subtherapeutic INR, Hx of CVA, HTN and Hypothyroidism who presentswith acute left-sided weakness and questionable M2 occlusion with improvement following IV TPA.Large region of acute/early subacute infarction involving right MCA and PCA territories. Involvement of right MCA and PCA Type of Study: Bedside Swallow Evaluation Previous Swallow Assessment: noted indicated dysphagia with need for thickened liquids  Diet Prior to this Study: NPO Temperature Spikes Noted: No Respiratory Status: Room air History of Recent Intubation: Yes Length of Intubations (days): 1 days Date extubated: 06/20/17 Behavior/Cognition: Alert;Cooperative;Pleasant mood Oral Cavity Assessment: Excessive secretions Oral Care Completed by SLP: Yes Oral Cavity - Dentition: Edentulous Vision: Functional for self-feeding Self-Feeding Abilities: Needs assist;Able to feed self Patient Positioning: Upright in chair Baseline Vocal Quality: Wet;Breathy Volitional Cough: Cognitively unable to elicit Volitional Swallow: Unable to elicit    Oral/Motor/Sensory Function Overall Oral Motor/Sensory Function: Severe impairment(difficult to fully assess due to aphasia) Facial ROM: Reduced left;Suspected CN VII (facial) dysfunction Facial Symmetry: Abnormal symmetry left;Suspected CN VII (facial) dysfunction Facial Strength: Reduced left;Suspected CN VII (facial) dysfunction Lingual ROM: Other (Comment)(unable to assess) Lingual Symmetry: Other (Comment)(unable to assess)   Ice Chips Ice chips: Not tested   Thin Liquid Thin Liquid: Not tested    Nectar Thick Nectar Thick Liquid: Not tested  Honey Thick Honey Thick Liquid:  Not tested   Puree Puree: Not tested   Solid   GO  Carolin Quang MA, CCC-SLP [phone]  Solid: Not tested        Cletus Paris [name] 06/21/2017,10:22 AM

## 2017-06-21 NOTE — Progress Notes (Signed)
Cortrak Tube Team Note:  Consult received to place a Cortrak feeding tube. Attempted to see pt. However, per RN pt was not appropriate for tube placement at this time.  RN to notify Cortrak team when tube placement appropriate.  Earma ReadingKate Jablonski Buffie Herne, MS, RD, LDN Pager: [phone] Weekend/After Hours: [phone]

## 2017-06-21 NOTE — Progress Notes (Signed)
PULMONARY / CRITICAL CARE MEDICINE   Name: Ronald [name] MRN: [id number] DOB: 12-Feb-1941    ADMISSION DATE:  06/19/2017 CONSULTATION DATE:  2/19  REFERRING MD:  Dr. Amada Jupiter  CHIEF COMPLAINT:  CVA  HISTORY OF PRESENT ILLNESS:  77 year old male heavy smoker with hx COPD, HTN, Atrial fibrillation, and CVA. He was in his usual state of health until 5:30 PM on 2/18 when he began to not feel well during dinner. He attempted to ambulate to the bathroom but fell. EMS was called and noted him to be weak no the left side with a R sided gaze preference. CT of the head demonstrated no acute findings. He was given systemic tpa and admitted to the ICU. CTA did not demonstrate any clear benefit from IR intervention, however, his defects persisted and it was decided to proceed with angiogram. No intervention was performed during angiogram. Patient remained on ventilator post op and was transferred to ICU.    SUBJECTIVE:   Extubated yesterday.  Weak cough, copious secretions.  Increasing O2 needs during the night.   VITAL SIGNS: BP 127/66   Pulse 93   Temp 98.6 F (37 C) (Axillary)   Resp 20   Ht 5\' 11"  (1.803 m)   Wt 72.5 kg (159 lb 13.3 oz)   SpO2 96%   BMI 22.29 kg/m   HEMODYNAMICS:    VENTILATOR SETTINGS: FiO2 (%):  [45 %-55 %] 45 %  INTAKE / OUTPUT: I/O last 3 completed shifts: In: 4005.3 [I.V.:3055.3; IV Piggyback:950] Out: 3730 [Urine:3725; Blood:5]  PHYSICAL EXAMINATION: General:  Chronically ill appearing barrel chested male, NAD  Neuro:  Eyes open but fairly somnolent, tracks at times, does not follow commands HEENT:  [place]/AT, PERRL, no JVD Cardiovascular:  IRIR, No MRG Lungs:  CTAB Abdomen:  Soft, non-distended Musculoskeletal:  No acute deformity Skin:  Grossly intact  LABS:  BMET Recent Labs  Lab 06/19/17 1917 06/19/17 1922 06/20/17 0331  NA 135 137 134*  K 3.4* 3.4* 4.4  CL 100* 98* 103  CO2 24  --  21*  BUN 10 11 9   CREATININE 0.80 0.90 0.71  GLUCOSE  119* 111* 123*    Electrolytes Recent Labs  Lab 06/19/17 1917 06/20/17 0331  CALCIUM 8.9 8.6*  MG  --  1.6*  PHOS  --  3.2    CBC Recent Labs  Lab 06/19/17 1917 06/19/17 1922 06/20/17 0331  WBC 5.8  --  8.4  HGB 14.4 14.6 13.2  HCT 40.3 43.0 37.9*  PLT 146*  --  147*    Coag's Recent Labs  Lab 06/19/17 1917  APTT 32  INR 1.23    Sepsis Markers No results for input(s): LATICACIDVEN, PROCALCITON, O2SATVEN in the last 168 hours.  ABG Recent Labs  Lab 06/20/17 0124  PHART 7.297*  PCO2ART 46.7  PO2ART 82.0*    Liver Enzymes Recent Labs  Lab 06/19/17 1917  AST 30  ALT 19  ALKPHOS 43  BILITOT 0.6  ALBUMIN 3.8    Cardiac Enzymes No results for input(s): TROPONINI, PROBNP in the last 168 hours.  Glucose Recent Labs  Lab 06/19/17 1916 06/20/17 0020  GLUCAP 109* 86    Imaging Mr Brain Wo Contrast  Addendum Date: 06/20/2017   ADDENDUM REPORT: 06/20/2017 18:38 ADDENDUM: Critical Value/emergent results were called by telephone at the time of interpretation on 06/20/2017 at 6:38 pm to Dr. Wilford Corner, who verbally acknowledged these results. Electronically Signed   By: Mitzi Hansen M.D.   On: 06/20/2017  18:38   Result Date: 06/20/2017 CLINICAL DATA:  77 y/o M; History of left MCA stroke presenting with left-sided weakness. EXAM: MRI HEAD WITHOUT CONTRAST TECHNIQUE: Multiplanar, multiecho pulse sequences of the brain and surrounding structures were obtained without intravenous contrast. COMPARISON:  06/19/2017 CT of the head, CTA head, and CT perfusion head. FINDINGS: Brain: Large region of reduced diffusion compatible with acute/early subacute infarction involving the right occipital lobe, temporal lobe, and to lesser degree lateral frontal parietal lobes with small foci of infarction in the right basal ganglia. Acute hemorrhage within the right lateral temporal lobe measuring 2.3 x 3.2 x 1.2 cm (volume = 4.6 cm^3)(series 7, image 11 series 10, image 12).  Edema associated with the hemorrhage and stroke results in local mass effect with partial effacement of temporal horn of right lateral ventricle and minimal right-to-left midline shift. No herniation. Chronic hemosiderin stained infarction involving left lateral frontal lobe and insula. Background of moderate chronic microvascular ischemic changes and parenchymal volume loss of the brain. Small focus of susceptibility hypointensity within left cerebellum compatible with chronic microhemorrhage. Vascular: Persistent central flow voids. Skull and upper cervical spine: Normal marrow signal. Sinuses/Orbits: Mild paranasal sinus mucosal thickening. No significant abnormal signal of mastoid air cells. Bilateral intra-ocular lens replacement. Other: None. IMPRESSION: 1. Large region of acute/early subacute infarction involving right MCA and PCA territories. Involvement of right MCA and PCA distributions is likely due to fetal PCA anatomy. 2. 3.2 cm, 4.6 cc acute hemorrhage within right lateral temporal lobe. Electronically Signed: By: Mitzi Hansen M.D. On: 06/20/2017 18:15     STUDIES:  CT head 2/18 > No acute finding. Chronic small-vessel changes of the white matter. 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 CTA head/neck 2/18 > Diffuse intracranial atherosclerotic disease. Apparent occlusion of a right insular/anterior temporal M2 branch. Serial severe stenoses in the right anterior cerebral artery. Atherosclerotic irregularity in both A1 segments. Atherosclerotic irregularity of the basilar artery without flow limiting stenosis.  Aortic atherosclerosis. Carotid bifurcation atherosclerosis without stenosis. 50% vertebral artery origin stenoses. 30-50% V4 stenoses.  CULTURES: None.  ANTIBIOTICS: None.  SIGNIFICANT EVENTS: 2/18 > admit  LINES/TUBES: ETT 2/18 >  DISCUSSION: 77 year old male with COPD and past CVA admitted again with CVA. Systemic tpa given in ED, however, symptoms did not  improve as expected. He was taken to IR for arteriogram, no therapeutic intervention done. Post op her remained on vent in ICU.   ASSESSMENT / PLAN:  PULMONARY A: Inability to protect airway in post-operative setting COPD without acute exacerbation P:   Aggressive pulmonary hygiene  Titrate O2 to keep sats 88-90%  Will add chest vest, mucomyst  BD's  Mobilize  NT suction PRN  See discussion below    CARDIOVASCULAR A:  Atrial fibrillation HTN P:  Telemetry monitoring Anticoagulation per neurology - holding for now  BP goal per neurology Holding metoprolol  RENAL A:   Hypokalemia - resolved Hypomagnesemia P:   Replete electrolytes PRN  Follow BMP  GASTROINTESTINAL A:   Nutrition P:   NPO PPI  HEMATOLOGIC A:   Subtherapeutic INR P:  Anticoagulation per neurology  INFECTIOUS A:   No acute issues P:   Monitor clinically  ENDOCRINE A:   Hypothryoid P:   Follow glucose on chemistry Continue synthroid  NEUROLOGIC A:   CVA s/p systemic TPA and arteriogram with no intervention.  P:   Per neurology   FAMILY  - Updates:  Daughter updated at bedside 2/20.  Discussed at length.  Pt had  previously been very clear about not wanting CPR, "life support".  She does not think he would want re-intubation especially if he would not "come off the machine" but is unsure if he would want re-intubation for short period of time.  Discussed with her that his underlying lung disease and poor airway protection r/t stroke may make it difficult for him to successfully wean and extubate if he requiring intubation again.  She is going to discuss with her brother.  Continue aggressive medical care for now.  LCB.    - Inter-disciplinary family meet or Palliative Care meeting due by:  2/25  CC time: 30 min.    Dirk Dress, NP 06/21/2017  11:41 AM Pager: [phone] or [phone]

## 2017-06-21 NOTE — Progress Notes (Signed)
Throughout therapies patient began to desat on nasal cannula to the low 80's, short of breath. Venti mask applied with minimal compensation. Non-rebreather applied, patient encouraged to cough and deep breath. Patient unable to clear thick secretions, NT suction completed. RT and MD aware.  Will continue to monitor.   1415- Pt saturations have increased to mid-high 90's. Pt dropped down to high flow nasal cannula per RT recommendations. Pt NT suctioned once again with clots of blood retrieved. Pt resting in bed in no apparent distress.  MD aware. Will continue to monitor.   1700- Spoke with daughter regarding respiratory status. Daughter wishes to have patient intubated in case patient decompensates. States she would like to give him a "fighting chance for a couple of days" and then reassess. Would like to speak with palliative regarding plan of care goals. Dr. Wallace CullensGray notified, new orders carried out. Will continue to monitor. Briant CedarFraizer, Cullen Vanallen RN BSN.

## 2017-06-21 NOTE — Progress Notes (Signed)
RT performed CPt via bed x's 10 mins. Pt tol well. VS stable. Patient weaned from NRB to 8L [place] HFNC. Sats 88-92. No distress noted. RT also NTS patient for moderate amounts thick tan secretions, also small amount of blood and blood clots with suction. Pt tol well. Will cont to monitor.

## 2017-06-21 NOTE — Progress Notes (Signed)
Patient desatted to 82% on 4L [place], increased to 6L and tried to get patient to cough. NT suctioned with little improvement.  Placed on 55% Venturi mask. Sats now 92.  Also started cardene to maintain SBP<140 per Neurology.

## 2017-06-21 NOTE — Evaluation (Signed)
Physical Therapy Evaluation Patient Details Name: Ronald [name] MRN: [id number] DOB: 08-07-1940 Today's Date: 06/21/2017   History of Present Illness  77 y.o. male with PMH of AFIB on Coumadin with subtherapeutic INR, Hx of CVA, HTN and Hypothyroidism who presents with acute left-sided weakness and questionable M2 occlusion with improvement following IV TPA  Clinical Impression  Orders received for PT evaluation. Patient demonstrates deficits in functional mobility as indicated below. Will benefit from continued skilled PT to address deficits and maximize function. Will see as indicated and progress as tolerated.  At this time, patient demonstrating some inattention/left neglect and is requiring increased physical assist for all aspects of mobility. Prior to admission, daughter states patient was very mobile and independent despite history of CVA (largest residual deficits was speech related). Given prior level of function, current deficits, and good family support (daughter well versed in health care and rehabilitation by profession), feel patient would be an ideal candidate for comprehensive inpatient rehabilitation services. Recommending CIR consult.  OF NOTE: Patient did experience elevated HR 140s and desaturation mid 80s with activities, exchanged 6 liters [place] for venti mask and eventually NRB with improvements noted upon conclusion of session (sats 99% on NRB). Patient also with noted nosebleed during session and increased secretions. Nsg in room during session and aware.     Follow Up Recommendations CIR    Equipment Recommendations  (TBD)    Recommendations for Other Services Rehab consult     Precautions / Restrictions Precautions Precautions: Fall Precaution Comments: watch O2 saturations and HR Restrictions Weight Bearing Restrictions: No      Mobility  Bed Mobility Overal bed mobility: Needs Assistance Bed Mobility: Supine to Sit     Supine to sit: Min assist      General bed mobility comments: assist to initiate LEs movement to EOB, in addition to physical assist to elevate trunk to upright (patient to long sitting position but multi modal cues and assist (min assist of 2 person) to rotate trunk and position at EOB  Transfers Overall transfer level: Needs assistance Equipment used: 2 person hand held assist(with wrap around support) Transfers: Sit to/from [employer] Sit to Stand: Min assist;+2 physical assistance Stand pivot transfers: Max assist;+2 physical assistance       General transfer comment: +2 min assist to power up to standing from bed, +2 max assist to pivot to left side with poor awareness of bedside commode positioning (on left).   Ambulation/Gait Ambulation/Gait assistance: Max assist;+2 physical assistance Ambulation Distance (Feet): 6 Feet Assistive device: 2 person hand held assist(with wrap around support) Gait Pattern/deviations: Step-to pattern     General Gait Details: patient with no spatiall or functional awareness of left side during mobility. Manual facilitation of LLE movement with 2 person physical assist to maintain upright  Stairs            Wheelchair Mobility    Modified Rankin (Stroke Patients Only) Modified Rankin (Stroke Patients Only) Pre-Morbid Rankin Score: Slight disability Modified Rankin: Moderately severe disability     Balance Overall balance assessment: Needs assistance Sitting-balance support: Feet supported Sitting balance-Leahy Scale: Fair Sitting balance - Comments: able to maintain sitting at EOB and on BSC with min guard assist Postural control: Posterior lean Standing balance support: During functional activity Standing balance-Leahy Scale: Poor Standing balance comment: required hands on physical assist to maintain upright positioning  Pertinent Vitals/Pain Pain Assessment: Faces Faces Pain Scale: Hurts little  more Pain Location: RUE with movement Pain Descriptors / Indicators: Grimacing Pain Intervention(s): Monitored during session    Home Living Family/patient expects to be discharged to:: Inpatient rehab                      Prior Function Level of Independence: Independent         Comments: history of CVA but per daughter was mobilizing well, most residual impairments were speech related. Daughter does note that patient does not read or write at baseline     Hand Dominance   Dominant Hand: Left    Extremity/Trunk Assessment        Lower Extremity Assessment Lower Extremity Assessment: Generalized weakness;LLE deficits/detail(poor awareness of Left during functional tasks) LLE Deficits / Details: decreased awareness of left side during task performance, multi modal cues for strength assessment and mobility. mild assymetry noted upon testing LLE Coordination: decreased fine motor;decreased gross motor    Cervical / Trunk Assessment Cervical / Trunk Assessment: Kyphotic  Communication   Communication: Expressive difficulties  Cognition Arousal/Alertness: Awake/alert Behavior During Therapy: Restless Overall Cognitive Status: Impaired/Different from baseline Area of Impairment: Attention;Memory;Following commands;Safety/judgement;Awareness;Problem solving                   Current Attention Level: Sustained(fleeting with fatigue)   Following Commands: Follows one step commands consistently;Follows one step commands with increased time Safety/Judgement: Decreased awareness of safety;Decreased awareness of deficits Awareness: Intellectual(patient with noted inattention/neglect to left ) Problem Solving: Slow processing;Difficulty sequencing;Requires verbal cues;Requires tactile cues General Comments: patient with noted inattention/neglect to left. Initially able to engage in banter with therapy team but as fatigue and increased WOB set in, patient with slower  processing and difficulty engaging       General Comments      Exercises     Assessment/Plan    PT Assessment Patient needs continued PT services  PT Problem List Decreased strength;Decreased activity tolerance;Decreased balance;Decreased mobility;Cardiopulmonary status limiting activity;Decreased coordination;Decreased cognition;Decreased safety awareness       PT Treatment Interventions DME instruction;Gait training;Functional mobility training;Therapeutic activities;Therapeutic exercise;Balance training;Neuromuscular re-education;Patient/family education    PT Goals (Current goals can be found in the Care Plan section)  Acute Rehab PT Goals Patient Stated Goal: per daughter to regain as much function as possible PT Goal Formulation: With patient/family Time For Goal Achievement: 07/05/17 Potential to Achieve Goals: Good    Frequency Min 4X/week   Barriers to discharge        Co-evaluation PT/OT/SLP Co-Evaluation/Treatment: Yes Reason for Co-Treatment: Complexity of the patient's impairments (multi-system involvement);Necessary to address cognition/behavior during functional activity;For patient/therapist safety;To address functional/ADL transfers PT goals addressed during session: Mobility/safety with mobility;Balance OT goals addressed during session: ADL's and self-care       AM-PAC PT "6 Clicks" Daily Activity  Outcome Measure Difficulty turning over in bed (including adjusting bedclothes, sheets and blankets)?: Unable Difficulty moving from lying on back to sitting on the side of the bed? : Unable Difficulty sitting down on and standing up from a chair with arms (e.g., wheelchair, bedside commode, etc,.)?: Unable Help needed moving to and from a bed to chair (including a wheelchair)?: A Lot Help needed walking in hospital room?: A Lot Help needed climbing 3-5 steps with a railing? : Total 6 Click Score: 8    End of Session Equipment Utilized During Treatment:  Oxygen Activity Tolerance: Patient limited by fatigue Patient left: in chair;with  call bell/phone within reach;with chair alarm set;with family/visitor present Nurse Communication: Mobility status(IV site attention, O2 saturations and resp. needs) PT Visit Diagnosis: Unsteadiness on feet (R26.81);Difficulty in walking, not elsewhere classified (R26.2);Other symptoms and signs involving the nervous system (R29.898)    Time: 7564-3329 PT Time Calculation (min) (ACUTE ONLY): 33 min   Charges:   PT Evaluation $PT Eval Moderate Complexity: 1 Mod     PT G Codes:        Charlotte Crumb, PT DPT  Board Certified Neurologic Specialist [phone]   Fabio Asa 06/21/2017, 5:34 PM

## 2017-06-21 NOTE — Evaluation (Signed)
Speech Language Pathology Evaluation Patient Details Name: Ronald [name] MRN: [id number] DOB: 10/15/1940 Today's Date: 06/21/2017 Time: 0102-7253 SLP Time Calculation (min) (ACUTE ONLY): 23 min  Problem List:  Patient Active Problem List   Diagnosis Date Noted  . 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 COPD (chronic obstructive pulmonary disease) (HCC)   . Emphysema lung (HCC)   . Hx of long term use of blood thinners   . Hypertension   . Stroke [facility]) 2013  . Thyroid disease    hypothyroidism   Past Surgical History:  Past Surgical History:  Procedure Laterality Date  . IR ANGIO EXTRACRAN SEL COM CAROTID INNOMINATE UNI R MOD SED  06/19/2017  . IR ANGIO VERTEBRAL SEL SUBCLAVIAN INNOMINATE UNI R MOD SED  06/19/2017  . RADIOLOGY WITH ANESTHESIA N/A 06/19/2017   Procedure: RADIOLOGY WITH ANESTHESIA;  Surgeon: Julieanne Cotton, MD;  Location: [facility] OR;  Service: Radiology;  Laterality: N/A;   HPI:  Mr.Ronald L Burnsis a 77 y.o.malewith PMH of AFIB on Coumadin with subtherapeutic INR, Hx of CVA, HTN and Hypothyroidism who presentswith acute left-sided weakness and questionable M2 occlusion with improvement following IV TPA.Large region of acute/early subacute infarction involving right MCA and PCA territories. Involvement of right MCA and PCA   Assessment / Plan / Recommendation Clinical Impression  Patient presents with significant cognitive-linguistic deficits including suspected aphasia, expressive > receptive (patient left handed), dysarthria, severe left sided inattention, decreased sustained attention and awareness. Per daughter, patient with h/o aphasia s/p previous right sided CVA in 2014 with complete resolve. Patient will benefit from SLP f/u to maximize functional communication and carryout of basic ADLs.     SLP Assessment  SLP Recommendation/Assessment: Patient needs continued Speech Lanaguage Pathology  Services SLP Visit Diagnosis: Aphasia (R47.01)    Follow Up Recommendations  Inpatient Rehab    Frequency and Duration min 3x week  2 weeks      SLP Evaluation Cognition  Overall Cognitive Status: Impaired/Different from baseline Arousal/Alertness: Awake/alert Orientation Level: Oriented to person Attention: Sustained Sustained Attention: Impaired Sustained Attention Impairment: Verbal basic Comments: to be evaluated more completely as communication improves       Comprehension  Auditory Comprehension Overall Auditory Comprehension: Impaired Yes/No Questions: Impaired Basic Biographical Questions: 26-50% accurate Commands: Impaired Two Step Basic Commands: 50-74% accurate Interfering Components: Attention Visual Recognition/Discrimination Discrimination: Within Function Limits(for common objects in a f/o 2) Reading Comprehension Reading Status: Not tested    Expression Expression Primary Mode of Expression: Verbal Verbal Expression Overall Verbal Expression: Impaired Initiation: Impaired Level of Generative/Spontaneous Verbalization: Word Repetition: Impaired Level of Impairment: Word level Naming: Impairment Responsive: 0-25% accurate Confrontation: Impaired Convergent: 0-24% accurate Divergent: 0-24% accurate Written Expression Dominant Hand: Left Written Expression: Not tested   Oral / Motor  Oral Motor/Sensory Function Overall Oral Motor/Sensory Function: Severe impairment(difficult to fully assess due to aphasia) Facial ROM: Reduced left;Suspected CN VII (facial) dysfunction Facial Symmetry: Abnormal symmetry left;Suspected CN VII (facial) dysfunction Facial Strength: Reduced left;Suspected CN VII (facial) dysfunction Lingual ROM: Other (Comment)(unable to assess) Lingual Symmetry: Other (Comment)(unable to assess) Motor Speech Overall Motor Speech: Impaired Respiration: Impaired Level of Impairment: Word Phonation: Breathy;Wet Resonance: Within  functional limits Articulation: Impaired Level of Impairment: Word Intelligibility: Intelligibility reduced Word: 0-24% accurate Motor Planning: (TBD)               Ronald Lango MA, CCC-SLP [phone]          Ronald [name]  Ronald [name] Ronald [name] 06/21/2017, 10:19 AM

## 2017-06-21 NOTE — Progress Notes (Signed)
NEUROHOSPITALISTS STROKE TEAM - DAILY PROGRESS NOTE   ADMISSION HISTORY: Ronald [name] is a 77 y.o. male with a history of previous L MCA stroke who was LKW while eating dineer at 5:30 pm. He said he was not feeling well and stood to go to the bathroom, but fell en route. He was seen to no tbe moving his left side with right gaze preference en route, but had some improvement in the left sided weakness on arrival.   He was not speaking much either. At baseline, he has some expressive aphasia.  LKW: 5:30 pm.  tpa given?: yes Premorbid modified rankin scale: 1  SUBJECTIVE (INTERVAL HISTORY) Daughter is at the bedside. Patient is found sitting in chair in NAD. Overall he feels his condition is slowly improving. Some increased strength on Left side today.  Voices no new complaints. No new events reported overnight. Blood pressure is adequately controlled.    OBJECTIVE Lab Results: CBC:  Recent Labs  Lab 06/19/17 1917 06/19/17 1922 06/20/17 0331  WBC 5.8  --  8.4  HGB 14.4 14.6 13.2  HCT 40.3 43.0 37.9*  MCV 97.8  --  98.2  PLT 146*  --  147*   BMP: Recent Labs  Lab 06/19/17 1917 06/19/17 1922 06/20/17 0331  NA 135 137 134*  K 3.4* 3.4* 4.4  CL 100* 98* 103  CO2 24  --  21*  GLUCOSE 119* 111* 123*  BUN 10 11 9   CREATININE 0.80 0.90 0.71  CALCIUM 8.9  --  8.6*  MG  --   --  1.6*  PHOS  --   --  3.2   Liver Function Tests:  Recent Labs  Lab 06/19/17 1917  AST 30  ALT 19  ALKPHOS 43  BILITOT 0.6  PROT 6.3*  ALBUMIN 3.8   Thyroid Function Studies: No results for input(s): TSH, T4TOTAL, T3FREE, THYROIDAB in the last 72 hours.   Coagulation Studies:  Recent Labs    06/19/17 1917  APTT 32  INR 1.23   Microbiology:  Recent Results (from the past 240 hour(s))  MRSA PCR Screening     Status: None   Collection Time: 06/19/17  9:33 PM  Result Value Ref Range Status   MRSA by PCR NEGATIVE NEGATIVE Final   Comment:        The GeneXpert MRSA Assay (FDA approved for NASAL specimens only), is one component of a comprehensive MRSA colonization surveillance program. It is not intended to diagnose MRSA infection nor to guide or monitor treatment for MRSA infections. Performed at [facility], [address], [place], [place] [address]   Culture, respiratory (NON-Expectorated)     Status: None (Preliminary result)   Collection Time: 06/20/17  1:30 AM  Result Value Ref Range Status   Specimen Description TRACHEAL ASPIRATE  Final   Special Requests NONE  Final   Gram Stain   Final    ABUNDANT WBC PRESENT, PREDOMINANTLY PMN ABUNDANT GRAM NEGATIVE RODS ABUNDANT GRAM NEGATIVE COCCOBACILLI MODERATE GRAM POSITIVE COCCI IN PAIRS    Culture   Final    CULTURE REINCUBATED FOR BETTER GROWTH Performed at [facility], [address] [place], [place] [address]    Report Status PENDING  Incomplete   Urinalysis:  Recent Labs  Lab 06/20/17 0338  COLORURINE YELLOW  APPEARANCEUR CLEAR  LABSPEC 1.020  PHURINE 5.0  GLUCOSEU NEGATIVE  HGBUR SMALL*  BILIRUBINUR NEGATIVE  KETONESUR NEGATIVE  PROTEINUR NEGATIVE  NITRITE NEGATIVE  LEUKOCYTESUR NEGATIVE  Urine Drug Screen:     Component Value Date/Time   LABOPIA NONE DETECTED 06/19/2017 0338   COCAINSCRNUR NONE DETECTED 06/19/2017 0338   LABBENZ NONE DETECTED 06/19/2017 0338   AMPHETMU NONE DETECTED 06/19/2017 0338   THCU NONE DETECTED 06/19/2017 0338   LABBARB NONE DETECTED 06/19/2017 0338    Alcohol Level:  Recent Labs  Lab 06/19/17 1914  ETH 129*   PHYSICAL EXAM Temp:  [98.5 F (36.9 C)-99 F (37.2 C)] 98.6 F (37 C) (02/20 0800) Pulse Rate:  [58-123] 93 (02/20 1115) Resp:  [15-39] 20 (02/20 1115) BP: (116-166)/(50-88) 127/66 (02/20 1115) SpO2:  [82 %-100 %] 96 % (02/20 1115) FiO2 (%):  [45 %-55 %] 45 % (02/20 0600) General - Well nourished, well developed, in no apparent distress HEENT-  Normocephalic,    Cardiovascular - Regular rate and rhythm  Respiratory - Lungs clear bilaterally. No wheezing. Abdomen - soft and non-tender, BS normal Extremities- no edema or cyanosis Mental Status: Patient is awake, he follows some simple commands, He does not appear to be attending to the left side as well as the right. Remains sedated and intubated Cranial Nerves: II: He responds to visual stimuli in the right visual field but not the left.  Pupils are equal, round, and reactive to light.   III,IV, VI: R gaze preference.  V: Facial sensation is diminished on the left VII: Facial movement is symmetric.  VIII: hearing is intact to voice X: Uvula elevates symmetrically XI: Shoulder shrug is symmetric. XII: tongue is midline without atrophy or fasciculations.  Motor: left hemiparesis 3/5 , able to lift the left arm and leg against gravity, but clearly is weaker on the left than the right. Sensory: Sensation is diminished on the left Cerebellar: No clear ataxia  IMAGING: I have personally reviewed the radiological images below and agree with the radiology interpretations. Ct Angio Head W Or Wo Contrast Ct Angio Neck W Or Wo Contrast Result Date: 06/19/2017  IMPRESSION: Diffuse intracranial atherosclerotic disease. Apparent occlusion of a right insular/anterior temporal M2 branch. Serial severe stenoses in the right anterior cerebral artery. Atherosclerotic irregularity in both A1 segments. Atherosclerotic irregularity of the basilar artery without flow limiting stenosis. Aortic atherosclerosis. Carotid bifurcation atherosclerosis without stenosis. 50% vertebral artery origin stenoses. 30-50% V4 stenoses. Electronically Signed   By: Paulina Fusi M.D.   On: 06/19/2017 20:01   Ct Cerebral Perfusion W Contrast Result Date: 06/19/2017 IMPRESSION: Acute changes in the right MCA territory with likely completed infarction affecting the right inferior temporal lobe and right inferior parietal lobe with  potentially salvageable brain in the insular region and along the margins of the likely completed right parietal infarction. Electronically Signed   By: Paulina Fusi M.D.   On: 06/19/2017 20:22   Portable Chest X-ray Result Date: 06/20/2017 IMPRESSION: 1. Tip of the endotracheal tube at the thoracic inlet. 2. Developing fluffy perihilar opacities, left greater than right, suspicious for pulmonary edema. Cardiomegaly is unchanged. Electronically Signed   By: Rubye Oaks M.D.   On: 06/20/2017 01:06   Ct Head Code Stroke Wo Contrast Result Date: 06/19/2017  IMPRESSION: 1. No acute finding. Chronic small-vessel changes of the white matter. 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 2. ASPECTS is 10   Echocardiogram:  Study Conclusions - Left ventricle: The cavity size was normal. There was moderate   concentric hypertrophy. Systolic function was normal. The   estimated ejection fraction was in the range of 55% to 60%. Wall   motion was normal; there were no regional wall motion   abnormalities. - Aortic valve: Transvalvular velocity was within the normal range.   There was no stenosis. There was moderate regurgitation.   Regurgitation pressure half-time: 409 ms. - Mitral valve: Transvalvular velocity was within the normal range.   There was no evidence for stenosis. There was mild regurgitation. - Left atrium: The atrium was severely dilated. - Right ventricle: The cavity size was normal. Wall thickness was   normal. Systolic function was mildly reduced. - Right atrium: The atrium was severely dilated. - Atrial septum: No defect or patent foramen ovale was identified   by color flow Doppler. - Tricuspid valve: There was moderate regurgitation. - Pulmonary arteries: Systolic pressure was mildly to moderately   increased. PA peak pressure: 49 mm Hg (S).  MRI HEAD WITHOUT CONTRAST 06/20/2017 18:15 IMPRESSION: 1. Large region of acute/early  subacute infarction involving right MCA and PCA territories. Involvement of right MCA and PCA distributions is likely due to fetal PCA anatomy. 2. 3.2 cm, 4.6 cc acute hemorrhage within right lateral temporal lobe.  CT Head:                                                PENDING  IN AM     IMPRESSION: [name] is a 77 y.o. male with PMH of AFIB on Coumadin with subtherapeutic INR, Hx of CVA, HTN and Hypothyroidism who presents with acute left-sided weakness and questionable M2 occlusion with improvement following IV TPA. Due to patients persistent deficits an angiogram was done to assess if there was truly an LVO and he was taken for this which was negative.  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  Suspected Etiology: likely atrial fibrillation with some therapeutic INR. Resultant Symptoms: Left sided deficits Stroke Risk Factors: atrial fibrillation, hyperlipidemia, hypertension and smoking Other Stroke Risk Factors: Advanced age, Hx of CVA, Hypothyroidism  PROCEDURES:   Dr Corliss Skains   06/19/2017 S/P RT common carotid arteriogram RT CFA approach  Findings. 1.No acute occlusions ,AV shunting or aneurysms noted. 2.Focal areas of mod to mod severe narrowing noted of the RT ACA A2 seg and the Rt MCA trifurcation branches   Outstanding Stroke Work-up Studies:     CT Head:                                                    PENDING IN AM  06/21/2017 ASSESSMENT:   Neuro exam improving slightly, able to move Left sided slightly more today. Continues to have Right gaze preference and Left sided neglect. Extubated overnight Imaging, labs and POC reviewed with daughter at bedside. MRI positive for hemorrhage. Will hold ASA for now. Repeat CT head in AM  PLAN  06/21/2017: Continue Statin, ASA on hold due to ICH on MRI  Frequent neuro checks Telemetry monitoring PT/OT/SLP Consult PM & Rehab - consulted this AM, to live with daughter after discharge Consult Case  Management /MSW  Ongoing aggressive stroke risk factor management Patient's family counseled to be compliant with his antithrombotic medications Patient's family counseled on Lifestyle modifications including, Diet, Exercise, and Stress Follow up with [facility] in 6 weeks  HX OF STROKES: previous L MCA stroke Baseline Findings: Expressive Aphasia  INTRACRANIAL Atherosclerosis &Stenosis: May consider DAPT prior to discharge  DYSPHAGIA: NPO, Failed SLP swallow evaluation, Cortrak pending Aspiration Precautions in progress  MEDICAL ISSUES:  ACUTE RESPIRATORY FAILURE: Management per CCM team - Appreciate assistance Extubated 06/20/2017 Repeat CXR - pending +Resp Cx - IV Unasyn in progress  AFIB, CHRONIC: Will wait to restart [facility] therapy for now due to ICH on MRI Metoprolol on Hold for now Admission INR subtherapeutic  Hyponatremia Hypokalemia Hypomagnesia Replacement in progress AM Repeat labs -pending  Thrombocytopenia Trend  AM Repeat labs - pending  Hypothryoidism Continue home dose synthroid Check TSH - labs pending  HYPERTENSION: Stable SBP goal of < 180. DBP goal of < 105.  Nicardipine drip, Labetolol PRN Long term BP goal normotensive. May slowly restart home B/P medications after 48 hours Home Meds: On Hold: Metoprolol  HYPERLIPIDEMIA:    Component Value Date/Time   CHOL 174 06/20/2017 0331   CHOL 196 07/16/2012 0313   TRIG 50 06/20/2017 0331   TRIG 111 07/16/2012 0313   HDL 61 06/20/2017 0331   HDL 43 07/16/2012 0313   CHOLHDL 2.9 06/20/2017 0331   VLDL 10 06/20/2017 0331   VLDL 22 07/16/2012 0313   LDLCALC 103 (H) 06/20/2017 0331   LDLCALC 131 (H) 07/16/2012 0313  Home Meds:  NONE LDL  goal < 70 Started on Lipitor to 20 mg daily Continue statin at discharge  R/O DIABETES: Lab Results  Component Value Date   HGBA1C 5.4 06/20/2017  HgbA1c goal < 7.0  TOBACCO ABUSE Current smoker Smoking cessation counseling  provided Nicotine patch provided  Other Active Problems: Active Problems:   Stroke (cerebrum) [facility])    Hospital day # 2 VTE prophylaxis: SCD's  Diet : Check puncture sites for bleeding or hematomas. Bleeding precautions Fall precautions Diet NPO time specified   FAMILY UPDATES: family at bedside  TEAM UPDATES: Micki Riley, MD   Prior Home Stroke Medications:  warfarin daily  Discharge Stroke Meds:  Please discharge patient on TBD ASA on hold due to ICH  Disposition:  Therapy Recs:               PENDING Follow Up:  Follow-up Information    Micki Riley, MD. Schedule an appointment as soon as possible for a visit in 6 week(s).   Specialties:  Neurology, Radiology Contact information: [address] [place] [address] [phone]        Corky Downs, MD. Schedule an appointment as soon as possible for a visit in 1 week(s).   Specialty:  Internal Medicine Contact information: [address] [place] [address] [phone]          Corky Downs, MD -PCP Follow up in 1-2 weeks      Assessment & plan discussed with with attending physician and they are in agreement.    Beryl Meager, ANP-C Stroke Neurology Team 06/21/2017 12:08 PM   Attending Note 06/20/2017 He presented with left hemiparesis secondary to right MCA branch infarct and received IV tPA and has shown some improvement. He was considered for mechanical thrombectomy but did not have lesion to treat. Recommend strict blood pressure control and close neurological monitoring as per post TPA protocol. Check  MRI scan of the brain later today. Discontinue groin arterial catheter. Check swallow eval and start home medications. Long discussion with patient and daughter at the bedside and answered questions.   Attending Note 06/21/2017 Patient is more alert but remains dysarthric with dysphagia and left hemi-plegia. MRI has shown hemorrhagic transformation. We'll hold antiplatelet  therapy. Repeat CT scan of the head without contrast tomorrow morning. Wean Cardene drip and use when necessary labetalol and hydralazine. Discuss with daughter at the bedside and Dr. Wallace CullensGray. This patient is critically ill and at significant risk of neurological worsening, death and care requires constant monitoring of vital signs, hemodynamics,respiratory and cardiac monitoring, extensive review of multiple databases, frequent neurological assessment, discussion with family, other specialists and medical decision making of high complexity.I have made any additions or clarifications directly to the above note.This critical care time does not reflect procedure time, or teaching time or supervisory time of PA/NP/Med Resident etc but could involve care discussion time.  I spent 30 minutes of neurocritical care time  in the care of  this patient.   Delia HeadyPramod Sethi, MD  [facility] [address] [place], [place] [id number]  Phone [phone] Fax [phone]  To contact Stroke Continuity provider, please refer to [email or web address]. After hours, contact General Neurology

## 2017-06-21 NOTE — Progress Notes (Signed)
Physical medicine rehabilitation consult requested chart reviewed. Patient currently medically compromised with 6 L of oxygen Venturi mask in place. PT and OT eval yet to be completed due to medical issues. Plan to await formal therapy evaluations with follow-up with appropriate recommendations

## 2017-06-21 NOTE — Progress Notes (Signed)
Occupational Therapy Note  OT eval completed.  Recommend CIR.  Full note to follow.  Jeani HawkingWendi Gwendoline Judy, OTR/L [phone]

## 2017-06-21 NOTE — Evaluation (Signed)
Occupational Therapy Evaluation Patient Details Name: Ronald [name] MRN: [id number] DOB: 1940-09-23 Today's Date: 06/21/2017    History of Present Illness 77 y.o. male with PMH of AFIB on Coumadin with subtherapeutic INR, Hx of CVA, HTN and Hypothyroidism who presents with acute left-sided weakness and questionable M2 occlusion with improvement following IV TPA   Clinical Impression   Pt admitted with above. He demonstrates the below listed deficits and will benefit from continued OT to maximize safety and independence with BADLs.  Pt presents to OT with Lt hemiparesis, Lt neglect, impaired balance, impaired cognition, decreased activity tolerance.  Pt with increased WOB with activity with sats decreasing to low 80s on venti mask with 40% FI02.  They rebounded to mid 90s on 100% non rebreather.  Pt requires mod - max A for ADLs, and max A +2 for functional mobility.   PTA, he lived with his male friend, who is elderly, and he was fully independent.  Daughter is very supportive and anticipates he may need to live with her at discharge, depending on level of care needed at that time.  Feel he will benefit from CIR.       Follow Up Recommendations  CIR;Supervision/Assistance - 24 hour    Equipment Recommendations  None recommended by OT    Recommendations for Other Services Rehab consult     Precautions / Restrictions Precautions Precautions: Fall Precaution Comments: watch O2 saturations and HR Restrictions Weight Bearing Restrictions: No      Mobility Bed Mobility Overal bed mobility: Needs Assistance Bed Mobility: Supine to Sit     Supine to sit: Min assist     General bed mobility comments: assist to initiate LEs movement to EOB, in addition to physical assist to elevate trunk to upright (patient to long sitting position but multi modal cues and assist (min assist of 2 person) to rotate trunk and position at EOB  Transfers Overall transfer level: Needs  assistance Equipment used: 2 person hand held assist(with wrap around support) Transfers: Sit to/from [employer] Sit to Stand: Min assist;+2 physical assistance Stand pivot transfers: Max assist;+2 physical assistance       General transfer comment: +2 min assist to power up to standing from bed, +2 max assist to pivot to left.  Due to neglect, pt with significant difficulty attending to items on his left especially when fatigued.  When turning toward [facility] on his left, he repeatedly attempted to turn Rt or go straight, not recognizing/seeing BSC.  Required physical assist to pivot, back up and to sit    Balance Overall balance assessment: Needs assistance Sitting-balance support: Feet supported Sitting balance-Leahy Scale: Fair Sitting balance - Comments: able to maintain sitting at EOB and on BSC with min guard assist Postural control: Posterior lean Standing balance support: During functional activity Standing balance-Leahy Scale: Poor Standing balance comment: required hands on physical assist to maintain upright positioning                           ADL either performed or assessed with clinical judgement   ADL Overall ADL's : Needs assistance/impaired Eating/Feeding: NPO   Grooming: Wash/dry hands;Wash/dry face;Brushing hair;Minimal assistance;Sitting   Upper Body Bathing: Maximal assistance;Sitting   Lower Body Bathing: Sit to/from stand;Maximal assistance   Upper Body Dressing : Maximal assistance;Sitting   Lower Body Dressing: Total assistance;Sit to/from stand   Toilet Transfer: +2 for physical assistance;BSC;Maximal assistance Toilet Transfer Details (indicate cue type and reason):  Pt unable to locate [facility] on his left, and required max multimodal cues to turn that direction and to sit  Toileting- Clothing Manipulation and Hygiene: Total assistance;Sit to/from stand       Functional mobility during ADLs: Moderate assistance;+2 for physical  assistance       Vision Baseline Vision/History: No visual deficits Additional Comments: Pt unable to participate in formal visual assessment due to impaired attention.  He was able to point to his daughter, who was in his left periphery      Perception Perception Perception Tested?: Yes Perception Deficits: Inattention/neglect Inattention/Neglect: Does not attend to left visual field;Does not attend to left side of body Spatial deficits: Pt unable to locate chair and BSC on his Lt.  Requires multimodal cues to attend to and use Lt. UE    Praxis Praxis Praxis tested?: Deficits Deficits: Initiation    Pertinent Vitals/Pain Pain Assessment: Faces Faces Pain Scale: Hurts little more Pain Location: RUE with movement Pain Descriptors / Indicators: Grimacing Pain Intervention(s): Monitored during session     Hand Dominance Left   Extremity/Trunk Assessment Upper Extremity Assessment Upper Extremity Assessment: LUE deficits/detail LUE Deficits / Details: Pt neglecting Lt UE, but with tactile imput, he is able to flex shoulder actively to ~110* in supine.  He demonstrates gross grasp and release.    LUE Sensation: decreased proprioception LUE Coordination: decreased fine motor;decreased gross motor   Lower Extremity Assessment Lower Extremity Assessment: Defer to PT evaluation LLE Deficits / Details: decreased awareness of left side during task performance, multi modal cues for strength assessment and mobility. mild assymetry noted upon testing LLE Coordination: decreased fine motor;decreased gross motor   Cervical / Trunk Assessment Cervical / Trunk Assessment: Kyphotic   Communication Communication Communication: Expressive difficulties   Cognition Arousal/Alertness: Awake/alert Behavior During Therapy: Restless Overall Cognitive Status: Impaired/Different from baseline Area of Impairment: Attention;Memory;Following commands;Safety/judgement;Awareness;Problem solving                    Current Attention Level: Sustained;Focused(fleeting with fatigue)   Following Commands: Follows one step commands consistently;Follows one step commands with increased time Safety/Judgement: Decreased awareness of safety;Decreased awareness of deficits Awareness: Intellectual Problem Solving: Slow processing;Difficulty sequencing;Requires verbal cues;Requires tactile cues General Comments: Pt slow to respond and to initiate activity.  He follows one step cues with cuing, but as he fatigued and WOB increased, he was unable to sustain his attention, and required max multimodal cues to respond    General Comments  Pt initially on 6L supplemental 02 with sats in the low 90s.  However, with exertion/activity sats decreased to 87-89%.  venti mask with 40% FI02 placed on pt with sats dropping into the low 80s.  RN present and pt progressed to 100% non rebreather with sats improving to mid 90s.  Pt's daughter present throughout eval.     Exercises     Shoulder Instructions      Home Living Family/patient expects to be discharged to:: Inpatient rehab                                 Additional Comments: Pt lived with male friend, who is elderly.   Pt will likely stay with daughter after discharge from rehab facility       Prior Functioning/Environment Level of Independence: Independent        Comments: history of CVA but per daughter was mobilizing well, most residual impairments were speech  related. Daughter does note that patient does not read or write at baseline        OT Problem List: Decreased strength;Decreased range of motion;Decreased activity tolerance;Impaired balance (sitting and/or standing);Impaired vision/perception;Decreased coordination;Decreased cognition;Decreased safety awareness;Decreased knowledge of use of DME or AE;Cardiopulmonary status limiting activity;Impaired sensation;Impaired UE functional use      OT  Treatment/Interventions: Self-care/ADL training;Neuromuscular education;DME and/or AE instruction;Therapeutic activities;Cognitive remediation/compensation;Visual/perceptual remediation/compensation;Patient/family education;Balance training    OT Goals(Current goals can be found in the care plan section) Acute Rehab OT Goals Patient Stated Goal: per daughter to regain as much function as possible OT Goal Formulation: With patient/family Time For Goal Achievement: 07/05/17 Potential to Achieve Goals: Good ADL Goals Pt Will Perform Grooming: with min assist;standing Pt Will Perform Upper Body Bathing: with min assist;sitting Pt Will Perform Lower Body Bathing: with min assist;sit to/from stand Pt Will Perform Upper Body Dressing: with min assist;sitting Pt Will Perform Lower Body Dressing: with mod assist;sit to/from stand Pt Will Transfer to Toilet: with min assist;ambulating;regular height toilet;bedside commode;grab bars Pt Will Perform Toileting - Clothing Manipulation and hygiene: with min assist;sit to/from stand Additional ADL Goal #1: Pt will locate ADL items on Lt with min cues  OT Frequency: Min 2X/week   Barriers to D/C:            Co-evaluation PT/OT/SLP Co-Evaluation/Treatment: Yes Reason for Co-Treatment: Complexity of the patient's impairments (multi-system involvement);Necessary to address cognition/behavior during functional activity;For patient/therapist safety;To address functional/ADL transfers PT goals addressed during session: Mobility/safety with mobility;Balance OT goals addressed during session: ADL's and self-care      AM-PAC PT "6 Clicks" Daily Activity     Outcome Measure Help from another person eating meals?: Total Help from another person taking care of personal grooming?: A Lot Help from another person toileting, which includes using toliet, bedpan, or urinal?: A Lot Help from another person bathing (including washing, rinsing, drying)?: A Lot Help  from another person to put on and taking off regular upper body clothing?: A Lot Help from another person to put on and taking off regular lower body clothing?: Total 6 Click Score: 10   End of Session Equipment Utilized During Treatment: Gait belt;Oxygen Nurse Communication: Mobility status;Other (comment)(02 sats )  Activity Tolerance: Patient limited by fatigue Patient left: in chair;with call bell/phone within reach;with nursing/sitter in room;with family/visitor present  OT Visit Diagnosis: Unsteadiness on feet (R26.81);Low vision, both eyes (H54.2);Cognitive communication deficit (R41.841);Hemiplegia and hemiparesis Symptoms and signs involving cognitive functions: Cerebral infarction Hemiplegia - Right/Left: Left Hemiplegia - dominant/non-dominant: Dominant Hemiplegia - caused by: Cerebral infarction                Time: 1430-1517 OT Time Calculation (min): 47 min Charges:  OT General Charges $OT Visit: 1 Visit OT Evaluation $OT Eval Moderate Complexity: 1 Mod G-Codes:     [name], OTR/L [phone]   Jeani Hawking M 06/21/2017, 7:27 PM

## 2017-06-22 ENCOUNTER — Inpatient Hospital Stay (HOSPITAL_COMMUNITY): Payer: Medicare Other

## 2017-06-22 DIAGNOSIS — R1312 Dysphagia, oropharyngeal phase: Secondary | ICD-10-CM

## 2017-06-22 DIAGNOSIS — I63511 Cerebral infarction due to unspecified occlusion or stenosis of right middle cerebral artery: Secondary | ICD-10-CM

## 2017-06-22 LAB — CULTURE, RESPIRATORY

## 2017-06-22 LAB — CULTURE, RESPIRATORY W GRAM STAIN: Culture: NORMAL

## 2017-06-22 LAB — BASIC METABOLIC PANEL
Anion gap: 10 (ref 5–15)
BUN: 7 mg/dL (ref 6–20)
CHLORIDE: 104 mmol/L (ref 101–111)
CO2: 23 mmol/L (ref 22–32)
CREATININE: 0.67 mg/dL (ref 0.61–1.24)
Calcium: 8.7 mg/dL — ABNORMAL LOW (ref 8.9–10.3)
GFR calc Af Amer: >60 mL/min (ref >60)
GFR calc non Af Amer: >60 mL/min (ref >60)
GLUCOSE: 113 mg/dL — AB (ref 65–99)
POTASSIUM: 3.8 mmol/L (ref 3.5–5.1)
Sodium: 137 mmol/L (ref 135–145)

## 2017-06-22 LAB — PHOSPHORUS: Phosphorus: 2.6 mg/dL (ref 2.5–4.6)

## 2017-06-22 LAB — MAGNESIUM: Magnesium: 2 mg/dL (ref 1.7–2.4)

## 2017-06-22 MED ORDER — ATORVASTATIN CALCIUM 40 MG PO TABS: 40.0000 mg | ORAL_TABLET | Freq: Every day | ORAL | Status: DC

## 2017-06-22 MED ORDER — CHLORHEXIDINE GLUCONATE 0.12 % MT SOLN
OROMUCOSAL | Status: AC
  Filled 2017-06-22: qty 15

## 2017-06-22 MED ORDER — ACETAMINOPHEN 10 MG/ML IV SOLN
1000.0000 mg | Freq: Four times a day (QID) | INTRAVENOUS | Status: AC
  Administered 2017-06-22 – 2017-06-23 (×4): 1000 mg via INTRAVENOUS
  Filled 2017-06-22 (×3): qty 100

## 2017-06-22 NOTE — Progress Notes (Signed)
Speech Language Pathology Treatment: Dysphagia;Cognitive-Linquistic  Patient Details Name: Ronald [name] MRN: [id number] DOB: 10-Apr-1941 Today's Date: 06/22/2017 Time: 1133-1200 SLP Time Calculation (min) (ACUTE ONLY): 27 min  Assessment / Plan / Recommendation Clinical Impression  Patient continues to present with evidence of a severe oropharyngeal dysphagia. Oral care complete prior to po trials to maximize safety with intake. Patient resistant to oral care. Ice chips provided with a moderate oral transit delay, eventual swallow trigger with immediate congested, non-productive cough response. Vocal quality remains significantly wet at baseline. Aspiration risk remains high. Continue to recommend temporary non-oral means of nutrition.   Patient able to follow basic 1-step directions today during functional ADLs with moderate cueing. Patient more verbal today with increased verbalizations however appearing more lethargic in general. Confrontation naming task complete in hopes of determining extent/presence of dysarthria vs aphasia. Patient able to name common objects with 30% accuracy with severely reduced intelligibility, likely unintelligible without the context of the task. Max verbal and tactile cueing required for direction of visual attention to left visual field. Note that per patient daughter, patient was intermittently slow to respond prior to this CVA, able to converse but only at the short sentence level. Will continue to f/u.    HPI HPI: Mr.Ronald L Burnsis a 77 y.o.malewith PMH of AFIB on Coumadin with subtherapeutic INR, Hx of CVA, HTN and Hypothyroidism who presentswith acute left-sided weakness and questionable M2 occlusion with improvement following IV TPA.Large region of acute/early subacute infarction involving right MCA and PCA territories. Involvement of right MCA and PCA      SLP Plan  Continue with current plan of care       Recommendations  Diet recommendations:  NPO Medication Administration: Via alternative means                Oral Care Recommendations: Oral care QID Follow up Recommendations: Inpatient Rehab SLP Visit Diagnosis: Aphasia (R47.01);Dysarthria and anarthria (R47.1);Dysphagia, oropharyngeal phase (R13.12) Plan: Continue with current plan of care       GO             [place] MA, CCC-SLP [phone]    [name] 06/22/2017, 12:03 PM

## 2017-06-22 NOTE — Care Management Note (Signed)
Case Management Note  Patient Details  Name: Marinus MawOtis L Chap MRN: [id number] Date of Birth: 26-May-1940  Subjective/Objective:   Pt admitted on 06/19/17 s/p RT MCA and PCA infarct s/p IV TPA.  PTA, pt independent of ADLS, has supportive family.                  Action/Plan: PT/OT recommending CIR, and consult in process. Will follow.     Expected Discharge Date:                  Expected Discharge Plan:  IP Rehab Facility  In-House Referral:     Discharge planning Services  CM Consult  Post Acute Care Choice:    Choice offered to:     DME Arranged:    DME Agency:     HH Arranged:    HH Agency:     Status of Service:  In process, will continue to follow  If discussed at Long Length of Stay Meetings, dates discussed:    Additional Comments:  Quintella BatonJulie W. Aliou Mealey, RN, BSN  Trauma/Neuro ICU Case Manager [phone]

## 2017-06-22 NOTE — Consult Note (Signed)
Physical Medicine and Rehabilitation Consult Reason for Consult: Left-sided weakness Referring Physician: Dr. Pearlean Brownie   HPI: Ronald [name] is a 77 y.o. 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 Per chart review patient lives with a male companion and reportedly mobile and independent. Plans to stay with daughter on discharge. Cranial CT scan negative for acute changes. 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 Patient did  receive TPA. INR on admission of 1.23. CT angiogram head and neck showed apparent occlusion of right insular anterior temporal M2 branch. CT cerebral perfusion scan showed acute changes in the right MCA territory compatible with infarction affecting the right inferior temporal lobe and right inferior parietal lobe. Echocardiogram with ejection fraction of 60% no wall motion abnormalities. Carotid arteriogram completed 06/19/2017 showing no acute occlusions, AV shunting or aneurysm noted. Patient did require short-term intubation. Follow-up MRI shows hemorrhagic transformation antiplatelet therapy held with follow-up per neurology services. Physical and occupational therapy evaluations completed 06/21/2017 with recommendations of physical medicine rehabilitation consult.   Review of Systems  Unable to perform ROS: Language   Past Medical History:  Diagnosis Date  . COPD (chronic obstructive pulmonary disease) (HCC)   . Emphysema lung (HCC)   . Hx of long term use of blood thinners   . Hypertension   . Stroke [facility]) 2013  . Thyroid disease    hypothyroidism   Past Surgical History:  Procedure Laterality Date  . IR ANGIO EXTRACRAN SEL COM CAROTID INNOMINATE UNI R MOD SED  06/19/2017  . IR ANGIO VERTEBRAL SEL SUBCLAVIAN INNOMINATE UNI R MOD SED  06/19/2017  . RADIOLOGY WITH ANESTHESIA N/A  06/19/2017   Procedure: RADIOLOGY WITH ANESTHESIA;  Surgeon: Julieanne Cotton, MD;  Location: [facility] OR;  Service: Radiology;  Laterality: N/A;   History reviewed. No pertinent family history. Social History:  reports that he has been smoking cigarettes.  He has been smoking about 1.00 pack per day. He does not have any smokeless tobacco history on file. He reports that he drinks alcohol. He reports that he does not use drugs. Allergies: No Known Allergies Medications Prior to Admission  Medication Sig Dispense Refill  . Aspirin-Salicylamide-Caffeine (ARTHRITIS STRENGTH BC POWDER PO) Take 1 packet by mouth every hour.    . gabapentin (NEURONTIN) 100 MG capsule Take 100 mg by mouth 3 (three) times daily as needed (back pain/spasms).   2  . levothyroxine (SYNTHROID, LEVOTHROID) 125 MCG tablet Take 125 mcg by mouth daily after supper.   3  . metoprolol tartrate (LOPRESSOR) 50 MG tablet Take 50 mg by mouth daily after supper.   3  . warfarin (COUMADIN) 5 MG tablet Take 5 mg by mouth daily after supper.   2    Home: Home Living Family/patient expects to be discharged to:: Inpatient rehab Additional Comments: Pt lived with male friend, who is elderly.   Pt will likely stay with daughter after discharge from rehab facility   Functional History: Prior Function Level of Independence: Independent Comments: history of CVA but per daughter was mobilizing well, most residual impairments were speech related. Daughter does note that patient does not read or write at baseline Functional Status:  Mobility: Bed Mobility Overal bed mobility: Needs Assistance Bed Mobility: Supine to Sit Supine to sit: Min assist General bed mobility comments: assist to initiate LEs movement to EOB, in addition to physical assist  to elevate trunk to upright (patient to long sitting position but multi modal cues and assist (min assist of 2 person) to rotate trunk and position at EOB Transfers Overall transfer level: Needs  assistance Equipment used: 2 person hand held assist(with wrap around support) Transfers: Sit to/from Stand, Stand Pivot Transfers Sit to Stand: Min assist, +2 physical assistance Stand pivot transfers: Max assist, +2 physical assistance General transfer comment: +2 min assist to power up to standing from bed, +2 max assist to pivot to left.  Due to neglect, pt with significant difficulty attending to items on his left especially when fatigued.  When turning toward [facility] on his left, he repeatedly attempted to turn Rt or go straight, not recognizing/seeing BSC.  Required physical assist to pivot, back up and to sit Ambulation/Gait Ambulation/Gait assistance: Max assist, +2 physical assistance Ambulation Distance (Feet): 6 Feet Assistive device: 2 person hand held assist(with wrap around support) Gait Pattern/deviations: Step-to pattern General Gait Details: patient with no spatiall or functional awareness of left side during mobility. Manual facilitation of LLE movement with 2 person physical assist to maintain upright    ADL: ADL Overall ADL's : Needs assistance/impaired Eating/Feeding: NPO Grooming: Wash/dry hands, Wash/dry face, Brushing hair, Minimal assistance, Sitting Upper Body Bathing: Maximal assistance, Sitting Lower Body Bathing: Sit to/from stand, Maximal assistance Upper Body Dressing : Maximal assistance, Sitting Lower Body Dressing: Total assistance, Sit to/from stand Toilet Transfer: +2 for physical assistance, BSC, Maximal assistance Toilet Transfer Details (indicate cue type and reason): Pt unable to locate [facility] on his left, and required max multimodal cues to turn that direction and to sit  Toileting- Clothing Manipulation and Hygiene: Total assistance, Sit to/from stand Functional mobility during ADLs: Moderate assistance, +2 for physical assistance  Cognition: Cognition Overall Cognitive Status: Impaired/Different from baseline Arousal/Alertness:  Awake/alert Orientation Level: Oriented to person Attention: Sustained Sustained Attention: Impaired Sustained Attention Impairment: Verbal basic Comments: to be evaluated more completely as communication improves Cognition Arousal/Alertness: Awake/alert Behavior During Therapy: Restless Overall Cognitive Status: Impaired/Different from baseline Area of Impairment: Attention, Memory, Following commands, Safety/judgement, Awareness, Problem solving Current Attention Level: Sustained, Focused(fleeting with fatigue) Following Commands: Follows one step commands consistently, Follows one step commands with increased time Safety/Judgement: Decreased awareness of safety, Decreased awareness of deficits Awareness: Intellectual Problem Solving: Slow processing, Difficulty sequencing, Requires verbal cues, Requires tactile cues General Comments: Pt slow to respond and to initiate activity.  He follows one step cues with cuing, but as he fatigued and WOB increased, he was unable to sustain his attention, and required max multimodal cues to respond   Blood pressure 138/68, pulse 98, temperature 99.5 F (37.5 C), temperature source Oral, resp. rate 20, height 5\' 11"  (1.803 m), weight 72.5 kg (159 lb 13.3 oz), SpO2 96 %. Physical Exam  Vitals reviewed. HENT:  Left facial droop  Eyes:  Pupils reactive to light  Neck: Normal range of motion. Neck supple. No thyromegaly present.  Cardiovascular:  Cardiac rate controlled  Respiratory:  Limited inspiratory effort with upper airway congestion  GI: Soft. Bowel sounds are normal. He exhibits no distension.  Neurological:  Appears to have a right gaze preference. He would give thumbs up on verbal commands. His speech is very dysarthric. Restless.. Moves all 4's but noticeable left sided weakness (inconsistent effort/attention)--perhaps 1-2 LUE and trace LLE.. Significant dysarthria, poor oro-motor control. Senses pain in all 4's.   Skin: Skin is warm and  dry.    Results for orders placed or performed during the  hospital encounter of 06/19/17 (from the past 24 hour(s))  Basic metabolic panel     Status: Abnormal   Collection Time: 06/21/17 12:35 PM  Result Value Ref Range   Sodium 137 135 - 145 mmol/L   Potassium 4.0 3.5 - 5.1 mmol/L   Chloride 105 101 - 111 mmol/L   CO2 24 22 - 32 mmol/L   Glucose, Bld 112 (H) 65 - 99 mg/dL   BUN 7 6 - 20 mg/dL   Creatinine, Ser 1.320.64 0.61 - 1.24 mg/dL   Calcium 8.7 (L) 8.9 - 10.3 mg/dL   GFR calc non Af Amer >60 >60 mL/min   GFR calc Af Amer >60 >60 mL/min   Anion gap 8 5 - 15  Magnesium     Status: None   Collection Time: 06/21/17 12:35 PM  Result Value Ref Range   Magnesium 2.0 1.7 - 2.4 mg/dL  Phosphorus     Status: Abnormal   Collection Time: 06/21/17 12:35 PM  Result Value Ref Range   Phosphorus 2.2 (L) 2.5 - 4.6 mg/dL  Basic metabolic panel     Status: Abnormal   Collection Time: 06/22/17  3:55 AM  Result Value Ref Range   Sodium 137 135 - 145 mmol/L   Potassium 3.8 3.5 - 5.1 mmol/L   Chloride 104 101 - 111 mmol/L   CO2 23 22 - 32 mmol/L   Glucose, Bld 113 (H) 65 - 99 mg/dL   BUN 7 6 - 20 mg/dL   Creatinine, Ser 4.400.67 0.61 - 1.24 mg/dL   Calcium 8.7 (L) 8.9 - 10.3 mg/dL   GFR calc non Af Amer >60 >60 mL/min   GFR calc Af Amer >60 >60 mL/min   Anion gap 10 5 - 15  Magnesium     Status: None   Collection Time: 06/22/17  3:55 AM  Result Value Ref Range   Magnesium 2.0 1.7 - 2.4 mg/dL  Phosphorus     Status: None   Collection Time: 06/22/17  3:55 AM  Result Value Ref Range   Phosphorus 2.6 2.5 - 4.6 mg/dL   Ct Head Wo Contrast  Result Date: 06/22/2017 CLINICAL DATA:  Follow-up stroke. EXAM: CT HEAD WITHOUT CONTRAST TECHNIQUE: Contiguous axial images were obtained from the base of the skull through the vertex without intravenous contrast. COMPARISON:  CT HEAD June 19, 2017 and MRI of the head June 20, 2016 FINDINGS: BRAIN: Evolving 3 x 1.9 cm RIGHT temporal acute hematoma  with surrounding vasogenic and cytotoxic edema. Wedge-like cytotoxic edema RIGHT occipital lobe extending to RIGHT parietal lobe and RIGHT periatrial white matter. Focal cyst cytotoxic edema to lesser extent RIGHT frontal lobe including insula, RIGHT basal ganglia. Old LEFT frontal lobe infarct. No midline shift. No parenchymal brain volume loss for age. No hydrocephalus. Patchy white matter changes exclusive a aforementioned abnormality compatible with moderate chronic small vessel ischemic disease. No abnormal extra-axial fluid collections. Basal cisterns are patent. VASCULAR: Mild calcific atherosclerosis of the carotid siphons. SKULL: No skull fracture. No significant scalp soft tissue swelling. SINUSES/ORBITS: Mild paranasal sinus mucosal thickening with small RIGHT maxillary sinus air-fluid level. Mastoid air cells are well aerated.The included ocular globes and orbital contents are non-suspicious. OTHER: Patient is edentulous. IMPRESSION: 1. Evolving multifocal acute RIGHT MCA territory infarct with evolving focal hemorrhagic conversion RIGHT temporal lobe. 2. Evolving nonhemorrhagic RIGHT PCA territory infarct. 3. Old LEFT frontal lobe/MCA territory infarct. Electronically Signed   By: Awilda Metroourtnay  Bloomer M.D.   On: 06/22/2017 04:29   Mr  Brain Wo Contrast  Addendum Date: 06/20/2017   ADDENDUM REPORT: 06/20/2017 18:38 ADDENDUM: Critical Value/emergent results were called by telephone at the time of interpretation on 06/20/2017 at 6:38 pm to Dr. Wilford Corner, who verbally acknowledged these results. Electronically Signed   By: Mitzi Hansen M.D.   On: 06/20/2017 18:38   Result Date: 06/20/2017 CLINICAL DATA:  77 y/o M; History of left MCA stroke presenting with left-sided weakness. EXAM: MRI HEAD WITHOUT CONTRAST TECHNIQUE: Multiplanar, multiecho pulse sequences of the brain and surrounding structures were obtained without intravenous contrast. COMPARISON:  06/19/2017 CT of the head, CTA head, and CT  perfusion head. FINDINGS: Brain: Large region of reduced diffusion compatible with acute/early subacute infarction involving the right occipital lobe, temporal lobe, and to lesser degree lateral frontal parietal lobes with small foci of infarction in the right basal ganglia. Acute hemorrhage within the right lateral temporal lobe measuring 2.3 x 3.2 x 1.2 cm (volume = 4.6 cm^3)(series 7, image 11 series 10, image 12). Edema associated with the hemorrhage and stroke results in local mass effect with partial effacement of temporal horn of right lateral ventricle and minimal right-to-left midline shift. No herniation. Chronic hemosiderin stained infarction involving left lateral frontal lobe and insula. Background of moderate chronic microvascular ischemic changes and parenchymal volume loss of the brain. Small focus of susceptibility hypointensity within left cerebellum compatible with chronic microhemorrhage. Vascular: Persistent central flow voids. Skull and upper cervical spine: Normal marrow signal. Sinuses/Orbits: Mild paranasal sinus mucosal thickening. No significant abnormal signal of mastoid air cells. Bilateral intra-ocular lens replacement. Other: None. IMPRESSION: 1. Large region of acute/early subacute infarction involving right MCA and PCA territories. Involvement of right MCA and PCA distributions is likely due to fetal PCA anatomy. 2. 3.2 cm, 4.6 cc acute hemorrhage within right lateral temporal lobe. Electronically Signed: By: Mitzi Hansen M.D. On: 06/20/2017 18:15    Assessment/Plan: Diagnosis: Right temporal-parietal infarct 1. Does the need for close, 24 hr/day medical supervision in concert with the patient's rehab needs make it unreasonable for this patient to be served in a less intensive setting? Yes 2. Co-Morbidities requiring supervision/potential complications: pain, aspiration precautions 3. Due to bladder management, bowel management, safety, skin/wound care, disease  management, medication administration, pain management and patient education, does the patient require 24 hr/day rehab nursing? Yes 4. Does the patient require coordinated care of a physician, rehab nurse, PT (1-2 hrs/day, 5 days/week), OT (1-2 hrs/day, 5 days/week) and SLP (1-2 hrs/day, 5 days/week) to address physical and functional deficits in the context of the above medical diagnosis(es)? Yes Addressing deficits in the following areas: balance, endurance, locomotion, strength, transferring, bowel/bladder control, bathing, dressing, feeding, grooming, toileting, cognition, speech, swallowing and psychosocial support 5. Can the patient actively participate in an intensive therapy program of at least 3 hrs of therapy per day at least 5 days per week? Yes 6. The potential for patient to make measurable gains while on inpatient rehab is excellent 7. Anticipated functional outcomes upon discharge from inpatient rehab are supervision and min assist  with PT, supervision and min assist with OT, supervision and min assist with SLP. 8. Estimated rehab length of stay to reach the above functional goals is: potentially 20-25 days 9. Anticipated D/C setting: Home 10. Anticipated post D/C treatments: HH therapy and Outpatient therapy 11. Overall Rehab/Functional Prognosis: excellent  RECOMMENDATIONS: This patient's condition is appropriate for continued rehabilitative care in the following setting: CIR Patient has agreed to participate in recommended program. N/A Note that insurance prior  authorization may be required for reimbursement for recommended care.  Comment: Rehab Admissions Coordinator to follow up.  Thanks,  Ranelle Oyster, MD, [place]    Mcarthur Rossetti Angiulli, PA-C 06/22/2017

## 2017-06-22 NOTE — Progress Notes (Signed)
Occupational Therapy Treatment Patient Details Name: Ronald [name] MRN: [id number] DOB: [birth year] Today's Date: 06/22/2017    History of present illness 77 y.o. male with PMH of AFIB , Hx of CVA, HTN and Hypothyroidism who presents with acute left-sided weakness , CT head hemorrhagic right MCA and PCA infarct following IV TPA   OT comments  Pt fatigued this pm, but willing to work with OT.  He requires min A for bed mobility, mod A for functional transfers and mod A for grooming while EOB sitting.  He demonstrates delayed response time of up to 15 seconds.   Lt inattention persists, but he spontaneously will attempt to incorporate Lt UE into function activities.  Feel he will benefit from CIR.   Follow Up Recommendations  CIR;Supervision/Assistance - 24 hour    Equipment Recommendations  None recommended by OT    Recommendations for Other Services Rehab consult    Precautions / Restrictions Precautions Precautions: Fall Precaution Comments: watch O2 saturations and HR       Mobility Bed Mobility Overal bed mobility: Needs Assistance Bed Mobility: Supine to Sit;Sit to Supine     Supine to sit: Min assist Sit to supine: Min assist   General bed mobility comments: requires increased time and assist to move LEs  off of and to EOB   Transfers Overall transfer level: Needs assistance   Transfers: Sit to/from Stand Sit to Stand: Mod assist Stand pivot transfers: Min assist;+2 safety/equipment       General transfer comment: assist for balance.  Attempted to side step up side of bed, but pt took one half step and returned to sitting     Balance Overall balance assessment: Needs assistance Sitting-balance support: Feet supported Sitting balance-Leahy Scale: Fair Sitting balance - Comments: able to maintain static sitting with min guard assist    Standing balance support: Single extremity supported Standing balance-Leahy Scale: Poor Standing balance comment: requires mod  A for balance and UE support                            ADL either performed or assessed with clinical judgement   ADL Overall ADL's : Needs assistance/impaired Eating/Feeding: NPO   Grooming: Brushing hair;Moderate assistance;Sitting Grooming Details (indicate cue type and reason): requires assist to comb left side.  he will place comb in Lt UE and attempt to lift it to head, but unable to complete task, then looses attention, and requires mod - max A to redirect                  Toilet Transfer: Moderate assistance;Stand-pivot;BSC   Toileting- Clothing Manipulation and Hygiene: Total assistance;Sit to/from stand       Functional mobility during ADLs: Moderate assistance       Vision   Additional Comments: Pt will locate items presented in Lt field inconsistently    Perception     Praxis      Cognition Arousal/Alertness: Awake/alert Behavior During Therapy: Flat affect Overall Cognitive Status: Difficult to assess Area of Impairment: Attention;Following commands;Safety/judgement;Problem solving                   Current Attention Level: Sustained   Following Commands: Follows one step commands inconsistently;Follows one step commands with increased time Safety/Judgement: Decreased awareness of safety;Decreased awareness of deficits   Problem Solving: Slow processing;Decreased initiation;Difficulty sequencing;Requires verbal cues;Requires tactile cues General Comments: Pt demonstrates up to 15 second delay in response time  Exercises     Shoulder Instructions       General Comments Upon therapist's entrance, pt had pulled out IV and was holding it in his hand - RN notified.  02 also off with sats in low 70s.  02 reapplied with sats returning to mid 90s on 15L hiflo 02.  Daughter present half way through session     Pertinent Vitals/ Pain       Pain Assessment: Faces Faces Pain Scale: No hurt  Home Living                                           Prior Functioning/Environment              Frequency  Min 2X/week        Progress Toward Goals  OT Goals(current goals can now be found in the care plan section)  Progress towards OT goals: Progressing toward goals     Plan Discharge plan remains appropriate    Co-evaluation                 AM-PAC PT "6 Clicks" Daily Activity     Outcome Measure   Help from another person eating meals?: Total Help from another person taking care of personal grooming?: A Lot Help from another person toileting, which includes using toliet, bedpan, or urinal?: A Lot Help from another person bathing (including washing, rinsing, drying)?: A Lot Help from another person to put on and taking off regular upper body clothing?: A Lot Help from another person to put on and taking off regular lower body clothing?: Total 6 Click Score: 10    End of Session Equipment Utilized During Treatment: Oxygen  OT Visit Diagnosis: Unsteadiness on feet (R26.81);Low vision, both eyes (H54.2);Cognitive communication deficit (R41.841);Hemiplegia and hemiparesis Symptoms and signs involving cognitive functions: Cerebral infarction Hemiplegia - Right/Left: Left Hemiplegia - dominant/non-dominant: Dominant Hemiplegia - caused by: Cerebral infarction   Activity Tolerance Patient limited by fatigue   Patient Left in bed;with call bell/phone within reach;with bed alarm set;with family/visitor present   Nurse Communication Mobility status        Time: 1336-1401 OT Time Calculation (min): 25 min  Charges: OT General Charges $OT Visit: 1 Visit OT Treatments $Self Care/Home Management : 23-37 mins  Ronald [name], OTR/L [phone]    Ronald [name] 06/22/2017, 2:46 PM

## 2017-06-22 NOTE — Progress Notes (Signed)
NEUROHOSPITALISTS STROKE TEAM - DAILY PROGRESS NOTE   ADMISSION HISTORY: Ronald [name] is a 77 y.o. male with a history of previous L MCA stroke who was LKW while eating dineer at 5:30 pm. He said he was not feeling well and stood to go to the bathroom, but fell en route. He was seen to no tbe moving his left side with right gaze preference en route, but had some improvement in the left sided weakness on arrival.   He was not speaking much either. At baseline, he has some expressive aphasia.  LKW: 5:30 pm.  tpa given?: yes Premorbid modified rankin scale: 1  SUBJECTIVE (INTERVAL HISTORY) Daughter is at the bedside. Patient is found sitting in chair in NAD. Overall he feels his condition is slowly improving. Some increased strength on Left side today.  Voices no new complaints. No new events reported overnight. Blood pressure is adequately controlled. Patient did not get a swallow eval yesterday. He still remains on Cardene drip has is unable to swallow. CT head today shows stable appearance of the hemorrhagic right MCA and PCA infarct and chest x-ray shows persistent left base infiltrates and right base atelectasis   OBJECTIVE Lab Results: CBC:  Recent Labs  Lab 06/19/17 1917 06/19/17 1922 06/20/17 0331 06/21/17 0346  WBC 5.8  --  8.4 8.9  HGB 14.4 14.6 13.2 13.5  HCT 40.3 43.0 37.9* 39.1  MCV 97.8  --  98.2 101.8*  PLT 146*  --  147* 135*   BMP: Recent Labs  Lab 06/19/17 1917 06/19/17 1922 06/20/17 0331 06/21/17 1235 06/22/17 0355  NA 135 137 134* 137 137  K 3.4* 3.4* 4.4 4.0 3.8  CL 100* 98* 103 105 104  CO2 24  --  21* 24 23  GLUCOSE 119* 111* 123* 112* 113*  BUN 10 11 9 7 7   CREATININE 0.80 0.90 0.71 0.64 0.67  CALCIUM 8.9  --  8.6* 8.7* 8.7*  MG  --   --  1.6* 2.0 2.0  PHOS  --   --  3.2 2.2* 2.6   Liver Function Tests:  Recent Labs  Lab 06/19/17 1917  AST 30  ALT 19  ALKPHOS 43  BILITOT 0.6  PROT 6.3*    ALBUMIN 3.8   Thyroid Function Studies:  Recent Labs    06/21/17 0346  TSH 1.117     Coagulation Studies:  Recent Labs    06/19/17 1917  APTT 32  INR 1.23   Microbiology:  Recent Results (from the past 240 hour(s))  MRSA PCR Screening     Status: None   Collection Time: 06/19/17  9:33 PM  Result Value Ref Range Status   MRSA by PCR NEGATIVE NEGATIVE Final    Comment:        The GeneXpert MRSA Assay (FDA approved for NASAL specimens only), is one component of a comprehensive MRSA colonization surveillance program. It is not intended to diagnose MRSA infection nor to guide or monitor treatment for MRSA infections. Performed at [facility], [address] [place], [place] [address]   Culture, respiratory (NON-Expectorated)     Status: None   Collection Time: 06/20/17  1:30 AM  Result Value Ref Range Status   Specimen Description TRACHEAL ASPIRATE  Final   Special Requests NONE  Final   Gram Stain   Final    ABUNDANT WBC PRESENT, PREDOMINANTLY PMN ABUNDANT GRAM NEGATIVE RODS ABUNDANT GRAM NEGATIVE COCCOBACILLI MODERATE GRAM POSITIVE COCCI IN PAIRS  Culture   Final    Consistent with normal respiratory flora. Performed at [facility], [address] [place], [place] [address]    Report Status 06/22/2017 FINAL  Final   Urinalysis:  Recent Labs  Lab 06/20/17 0338  COLORURINE YELLOW  APPEARANCEUR CLEAR  LABSPEC 1.020  PHURINE 5.0  GLUCOSEU NEGATIVE  HGBUR SMALL*  BILIRUBINUR NEGATIVE  KETONESUR NEGATIVE  PROTEINUR NEGATIVE  NITRITE NEGATIVE  LEUKOCYTESUR NEGATIVE   Urine Drug Screen:     Component Value Date/Time   LABOPIA NONE DETECTED 06/19/2017 0338   COCAINSCRNUR NONE DETECTED 06/19/2017 0338   LABBENZ NONE DETECTED 06/19/2017 0338   AMPHETMU NONE DETECTED 06/19/2017 0338   THCU NONE DETECTED 06/19/2017 0338   LABBARB NONE DETECTED 06/19/2017 0338    Alcohol Level:  Recent Labs  Lab 06/19/17 1914  ETH 129*   PHYSICAL  EXAM Temp:  [98.3 F (36.8 C)-99.6 F (37.6 C)] 99.5 F (37.5 C) (02/21 0400) Pulse Rate:  [45-156] 88 (02/21 1100) Resp:  [19-29] 25 (02/21 1100) BP: (107-155)/(53-88) 116/64 (02/21 1100) SpO2:  [83 %-100 %] 93 % (02/21 1100) General - Well nourished, well developed, in no apparent distress HEENT-  Normocephalic,  Cardiovascular - Regular rate and rhythm  Respiratory - Lungs clear bilaterally. No wheezing. Abdomen - soft and non-tender, BS normal Extremities- no edema or cyanosis Mental Status: Patient is awake, he follows some simple commands, He does not appear to be attending to the left side as well as the right. Remains sedated and intubated Cranial Nerves: II: He responds to visual stimuli in the right visual field but not the left.  Pupils are equal, round, and reactive to light.   III,IV, VI: R gaze preference.  V: Facial sensation is diminished on the left VII: Facial movement is symmetric.  VIII: hearing is intact to voice X: Uvula elevates symmetrically XI: Shoulder shrug is symmetric. XII: tongue is midline without atrophy or fasciculations.  Motor: left hemiparesis 3/5 , able to lift the left arm and leg against gravity, but clearly is weaker on the left than the right. Sensory: Sensation is diminished on the left Cerebellar: No clear ataxia  IMAGING: I have personally reviewed the radiological images below and agree with the radiology interpretations. Ct Angio Head W Or Wo Contrast Ct Angio Neck W Or Wo Contrast Result Date: 06/19/2017  IMPRESSION: Diffuse intracranial atherosclerotic disease. Apparent occlusion of a right insular/anterior temporal M2 branch. Serial severe stenoses in the right anterior cerebral artery. Atherosclerotic irregularity in both A1 segments. Atherosclerotic irregularity of the basilar artery without flow limiting stenosis. Aortic atherosclerosis. Carotid bifurcation atherosclerosis without stenosis. 50% vertebral artery origin stenoses.  30-50% V4 stenoses. Electronically Signed   By: Paulina Fusi M.D.   On: 06/19/2017 20:01   Ct Cerebral Perfusion W Contrast Result Date: 06/19/2017 IMPRESSION: Acute changes in the right MCA territory with likely completed infarction affecting the right inferior temporal lobe and right inferior parietal lobe with potentially salvageable brain in the insular region and along the margins of the likely completed right parietal infarction. Electronically Signed   By: Paulina Fusi M.D.   On: 06/19/2017 20:22   Portable Chest X-ray Result Date: 06/20/2017 IMPRESSION: 1. Tip of the endotracheal tube at the thoracic inlet. 2. Developing fluffy perihilar opacities, left greater than right, suspicious for pulmonary edema. Cardiomegaly is unchanged. Electronically Signed   By: Rubye Oaks M.D.   On: 06/20/2017 01:06   Ct Head Code Stroke Wo Contrast Result Date: 06/19/2017  IMPRESSION: 1. No acute finding. Chronic small-vessel changes of the white matter. 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 2. ASPECTS is 10   Echocardiogram:                                               Study Conclusions - Left ventricle: The cavity size was normal. There was moderate   concentric hypertrophy. Systolic function was normal. The   estimated ejection fraction was in the range of 55% to 60%. Wall   motion was normal; there were no regional wall motion   abnormalities. - Aortic valve: Transvalvular velocity was within the normal range.   There was no stenosis. There was moderate regurgitation.   Regurgitation pressure half-time: 409 ms. - Mitral valve: Transvalvular velocity was within the normal range.   There was no evidence for stenosis. There was mild regurgitation. - Left atrium: The atrium was severely dilated. - Right ventricle: The cavity size was normal. Wall thickness was   normal. Systolic function was mildly reduced. - Right atrium: The atrium was severely dilated. - Atrial septum: No defect or patent  foramen ovale was identified   by color flow Doppler. - Tricuspid valve: There was moderate regurgitation. - Pulmonary arteries: Systolic pressure was mildly to moderately   increased. PA peak pressure: 49 mm Hg (S).  MRI HEAD WITHOUT CONTRAST 06/20/2017 18:15 IMPRESSION: 1. Large region of acute/early subacute infarction involving right MCA and PCA territories. Involvement of right MCA and PCA distributions is likely due to fetal PCA anatomy. 2. 3.2 cm, 4.6 cc acute hemorrhage within right lateral temporal lobe.  CT Head:  06/22/2017   1. Evolving multifocal acute RIGHT MCA territory infarct with evolving focal hemorrhagic conversion RIGHT temporal lobe. 2. Evolving nonhemorrhagic RIGHT PCA territory infarct. 3. Old LEFT frontal lobe/MCA territory infarct.                                                 IMPRESSION: Ronald [name] is a 77 y.o. male with PMH of AFIB on Coumadin with subtherapeutic INR, Hx of CVA, HTN and Hypothyroidism who presents with acute left-sided weakness and questionable M2 occlusion with improvement following IV TPA. Due to patients persistent deficits an angiogram was done to assess if there was truly an LVO and he was taken for this which was negative.  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  Suspected Etiology: likely atrial fibrillation with some therapeutic INR. Resultant Symptoms: Left sided deficits Stroke Risk Factors: atrial fibrillation, hyperlipidemia, hypertension and smoking Other Stroke Risk Factors: Advanced age, Hx of CVA, Hypothyroidism  PROCEDURES:   Dr Corliss Skains   06/19/2017 S/P RT common carotid arteriogram RT CFA approach  Findings. 1.No acute occlusions ,AV shunting or aneurysms noted. 2.Focal areas of mod to mod severe narrowing noted of the RT ACA A2 seg and the Rt MCA trifurcation branches      06/22/2017 ASSESSMENT:   Neuro exam improving slightly, able to move Left sided slightly more today. Continues to  have Right gaze preference and Left sided neglect. Extubated overnight Imaging, labs and POC reviewed with daughter at bedside. MRI positive for hemorrhage. Will hold ASA for now. Repeat CT head in AM  PLAN  06/22/2017:  Continue Statin, ASA on hold due to ICH on MRI  Frequent neuro checks Telemetry monitoring PT/OT/SLP Consult PM & Rehab - consulted this AM, to live with daughter after discharge Consult Case Management /MSW Ongoing aggressive stroke risk factor management Patient's family counseled to be compliant with his antithrombotic medications Patient's family counseled on Lifestyle modifications including, Diet, Exercise, and Stress Follow up with [facility] in 6 weeks  HX OF STROKES: previous L MCA stroke Baseline Findings: Expressive Aphasia  INTRACRANIAL Atherosclerosis &Stenosis: May consider DAPT prior to discharge  DYSPHAGIA: NPO, Failed SLP swallow evaluation, Cortrak pending Aspiration Precautions in progress  MEDICAL ISSUES:  ACUTE RESPIRATORY FAILURE: Management per CCM team - Appreciate assistance Extubated 06/20/2017 Repeat CXR - pending +Resp Cx - IV Unasyn in progress  AFIB, CHRONIC: [facility] due to ICH on MRI Metoprolol on Hold for now Admission INR subtherapeutic  Hyponatremia Hypokalemia Hypomagnesia Replacement in progress AM Repeat labs -pending  Thrombocytopenia Trend  AM Repeat labs - pending  Hypothryoidism Continue home dose synthroid Check TSH - labs pending  HYPERTENSION: Stable SBP goal of < 180. DBP goal of < 105.  Nicardipine drip, Labetolol PRN Long term BP goal normotensive. May slowly restart home B/P medications after 48 hours Home Meds: On Hold: Metoprolol  HYPERLIPIDEMIA:    Component Value Date/Time   CHOL 174 06/20/2017 0331   CHOL 196 07/16/2012 0313   TRIG 50 06/20/2017 0331   TRIG 111 07/16/2012 0313   HDL 61 06/20/2017 0331   HDL 43 07/16/2012 0313    CHOLHDL 2.9 06/20/2017 0331   VLDL 10 06/20/2017 0331   VLDL 22 07/16/2012 0313   LDLCALC 103 (H) 06/20/2017 0331   LDLCALC 131 (H) 07/16/2012 0313  Home Meds:  NONE LDL  goal < 70 Started on Lipitor to 20 mg daily Continue statin at discharge  R/O DIABETES: Lab Results  Component Value Date   HGBA1C 5.4 06/20/2017  HgbA1c goal < 7.0  TOBACCO ABUSE Current smoker Smoking cessation counseling provided Nicotine patch provided  Other Active Problems: Active Problems:   Stroke (cerebrum) [facility](HCC)    Hospital day # 3 VTE prophylaxis: SCD's  Diet : Check puncture sites for bleeding or hematomas. Bleeding precautions Fall precautions Diet NPO time specified   FAMILY UPDATES: family at bedside  TEAM UPDATES: Micki RileySethi, Ileanna Gemmill S, MD   Prior Home Stroke Medications:  warfarin daily  Discharge Stroke Meds:  Please discharge patient on TBD ASA on hold due to ICH  Disposition:  Therapy Recs:               PENDING Follow Up:  Follow-up Information    Micki RileySethi, Latravion Graves S, MD. Schedule an appointment as soon as possible for a visit in 6 week(s).   Specialties:  Neurology, Radiology Contact information: [address] [place] [address] [phone]        Corky DownsMasoud, Javed, MD. Schedule an appointment as soon as possible for a visit in 1 week(s).   Specialty:  Internal Medicine Contact information: [address] [place] [address] 337 [phone]          Corky DownsMasoud, Javed, MD -PCP Follow up in 1-2 weeks         Attending Note 06/20/2017 He presented with left hemiparesis secondary to right MCA branch infarct and received IV tPA and has shown some improvement. He was considered for mechanical thrombectomy but did not have lesion to treat. Recommend strict blood pressure control  and close neurological monitoring as per post TPA protocol. Check MRI scan of the brain later today. Discontinue groin arterial catheter. Check swallow eval and start home medications. Long  discussion with patient and daughter at the bedside and answered questions.   Attending Note 06/22/2017 Patient is more alert but remains dysarthric with dysphagia and left hemi-plegia.  Repeat CT scan of the head  shows stable appearance of his hemorrhagic infarct without significant mass effect or midline shift Wean Cardene drip and use when necessary labetalol and hydralazine. Check swallow eval today. May need panda tube with he does not pass Discussed with daughter at the bedside and Dr. Wallace Cullens. This patient is critically ill and at significant risk of neurological worsening, death and care requires constant monitoring of vital signs, hemodynamics,respiratory and cardiac monitoring, extensive review of multiple databases, frequent neurological assessment, discussion with family, other specialists and medical decision making of high complexity.I have made any additions or clarifications directly to the above note.This critical care time does not reflect procedure time, or teaching time or supervisory time of PA/NP/Med Resident etc but could involve care discussion time.  I spent 30 minutes of neurocritical care time  in the care of  this patient.   Delia Heady, MD  [facility] [address] [place], [place] [address]  Phone [phone] Fax [phone]  To contact Stroke Continuity provider, please refer to [email or web address]. After hours, contact General Neurology

## 2017-06-22 NOTE — Progress Notes (Signed)
Palliative Medicine consult noted. Due to high referral volume, there may be a delay seeing this patient. Please call the Palliative Medicine Team office at [phone] if recommendations are needed in the interim.  Thank you for inviting us to see this patient.  Stonewall Doss G. Zamyra Allensworth, RN, BSN, CHPN Palliative Medicine Team 06/22/2017 3:17 PM Office [phone] 

## 2017-06-22 NOTE — Progress Notes (Signed)
PULMONARY / CRITICAL CARE MEDICINE   Name: Ronald [name] MRN: [id number] DOB: Apr 19, 1941    ADMISSION DATE:  06/19/2017 CONSULTATION DATE:  2/19  REFERRING MD:  Dr. Amada Jupiter  CHIEF COMPLAINT:  CVA  HISTORY OF PRESENT ILLNESS:  77 year old male heavy smoker with hx COPD, HTN, Atrial fibrillation, and CVA. He was in his usual state of health until 5:30 PM on 2/18 when he began to not feel well during dinner. He attempted to ambulate to the bathroom but fell. EMS was called and noted him to be weak no the left side with a R sided gaze preference. CT of the head demonstrated no acute findings. He was given systemic tpa and admitted to the ICU. CTA did not demonstrate any clear benefit from IR intervention, however, his defects persisted and it was decided to proceed with angiogram. No intervention was performed during angiogram. Patient remained on ventilator post op and was transferred to ICU.    SUBJECTIVE:   Extubated 2/19  Continues to have garbled speech, does follow simple instructions.  He continues to have upper airway gurgling but is not having increased work of breathing.  He is still on a Cardene infusion for blood pressure control as we currently have no enteral route of administering medicines   VITAL SIGNS: BP (!) 113/59 Comment: cardene down  Pulse 84   Temp 99.5 F (37.5 C) (Oral)   Resp (!) 24   Ht 5\' 11"  (1.803 m)   Wt 159 lb 13.3 oz (72.5 kg)   SpO2 93%   BMI 22.29 kg/m   HEMODYNAMICS:      INTAKE / OUTPUT: I/O last 3 completed shifts: In: 4154.5 [I.V.:3344.5; Other:10; IV Piggyback:800] Out: 2700 [Urine:2700]  PHYSICAL EXAMINATION: General:  Chronically ill appearing barrel chested male, NAD  Neuro:  Eyes open but fairly somnolent, pupils equal.  Follow some simple instructions with more vigorous movement in the right upper extremity than in the left.   Cardiovascular:  IRIR, No MRG Lungs: Transmitted upper airway noises, good air movement throughout,  no wheezes  Abdomen:  Soft, non-distended Musculoskeletal:  No acute deformity Skin:  Grossly intact  LABS:  BMET Recent Labs  Lab 06/20/17 0331 06/21/17 1235 06/22/17 0355  NA 134* 137 137  K 4.4 4.0 3.8  CL 103 105 104  CO2 21* 24 23  BUN 9 7 7   CREATININE 0.71 0.64 0.67  GLUCOSE 123* 112* 113*    Electrolytes Recent Labs  Lab 06/20/17 0331 06/21/17 1235 06/22/17 0355  CALCIUM 8.6* 8.7* 8.7*  MG 1.6* 2.0 2.0  PHOS 3.2 2.2* 2.6    CBC Recent Labs  Lab 06/19/17 1917 06/19/17 1922 06/20/17 0331 06/21/17 0346  WBC 5.8  --  8.4 8.9  HGB 14.4 14.6 13.2 13.5  HCT 40.3 43.0 37.9* 39.1  PLT 146*  --  147* 135*    Coag's Recent Labs  Lab 06/19/17 1917  APTT 32  INR 1.23    Sepsis Markers No results for input(s): LATICACIDVEN, PROCALCITON, O2SATVEN in the last 168 hours.  ABG Recent Labs  Lab 06/20/17 0124  PHART 7.297*  PCO2ART 46.7  PO2ART 82.0*    Liver Enzymes Recent Labs  Lab 06/19/17 1917  AST 30  ALT 19  ALKPHOS 43  BILITOT 0.6  ALBUMIN 3.8    Cardiac Enzymes No results for input(s): TROPONINI, PROBNP in the last 168 hours.  Glucose Recent Labs  Lab 06/19/17 1916 06/20/17 0020  GLUCAP 109* 86  Imaging Ct Head Wo Contrast  Result Date: 06/22/2017 CLINICAL DATA:  Follow-up stroke. EXAM: CT HEAD WITHOUT CONTRAST TECHNIQUE: Contiguous axial images were obtained from the base of the skull through the vertex without intravenous contrast. COMPARISON:  CT HEAD June 19, 2017 and MRI of the head June 20, 2016 FINDINGS: BRAIN: Evolving 3 x 1.9 cm RIGHT temporal acute hematoma with surrounding vasogenic and cytotoxic edema. Wedge-like cytotoxic edema RIGHT occipital lobe extending to RIGHT parietal lobe and RIGHT periatrial white matter. Focal cyst cytotoxic edema to lesser extent RIGHT frontal lobe including insula, RIGHT basal ganglia. Old LEFT frontal lobe infarct. No midline shift. No parenchymal brain volume loss for age. No  hydrocephalus. Patchy white matter changes exclusive a aforementioned abnormality compatible with moderate chronic small vessel ischemic disease. No abnormal extra-axial fluid collections. Basal cisterns are patent. VASCULAR: Mild calcific atherosclerosis of the carotid siphons. SKULL: No skull fracture. No significant scalp soft tissue swelling. SINUSES/ORBITS: Mild paranasal sinus mucosal thickening with small RIGHT maxillary sinus air-fluid level. Mastoid air cells are well aerated.The included ocular globes and orbital contents are non-suspicious. OTHER: Patient is edentulous. IMPRESSION: 1. Evolving multifocal acute RIGHT MCA territory infarct with evolving focal hemorrhagic conversion RIGHT temporal lobe. 2. Evolving nonhemorrhagic RIGHT PCA territory infarct. 3. Old LEFT frontal lobe/MCA territory infarct. Electronically Signed   By: Awilda Metro M.D.   On: 06/22/2017 04:29   Dg Chest Portable 1 View  Result Date: 06/22/2017 CLINICAL DATA:  Respiratory failure EXAM: PORTABLE CHEST 1 VIEW COMPARISON:  06/20/2017 FINDINGS: Cardiac shadow remains enlarged. Postsurgical changes are again noted. The endotracheal tube has been removed. Persistent changes are noted in the left mid and lower lung. Increasing right basilar atelectasis and effusion is seen. No bony abnormality is noted. IMPRESSION: Persistent changes in the left base. Increasing basilar atelectasis and effusion on the right. Electronically Signed   By: Alcide Clever M.D.   On: 06/22/2017 07:37     STUDIES:  CT head 2/18 > No acute finding. Chronic small-vessel changes of the white matter. 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 CTA head/neck 2/18 > Diffuse intracranial atherosclerotic disease. Apparent occlusion of a right insular/anterior temporal M2 branch. Serial severe stenoses in the right anterior cerebral artery. Atherosclerotic irregularity in both A1 segments. Atherosclerotic irregularity of the basilar artery without flow  limiting stenosis.  Aortic atherosclerosis. Carotid bifurcation atherosclerosis without stenosis. 50% vertebral artery origin stenoses. 30-50% V4 stenoses.  CULTURES: None.  ANTIBIOTICS: Unasyn .  SIGNIFICANT EVENTS: 2/18 > admit  LINES/TUBES: ETT 2/18 >  DISCUSSION: 77 year old male with COPD and past CVA admitted again with CVA. Systemic tpa given in ED, however, symptoms did not improve as expected. He was taken to IR for arteriogram, no therapeutic intervention done. Post op her remained on vent in ICU, has been extubated but has had some gurgling and difficulties in controlling his upper airway.  ASSESSMENT / PLAN:  PULMONARY A:  COPD without acute exacerbation P:   Aggressive pulmonary hygiene  Titrate O2 to keep sats 88-90%  chest vest  BD's  Mobilize  NT suction PRN  See discussion below    CARDIOVASCULAR A:  Atrial fibrillation HTN P:  Telemetry monitoring Anticoagulation per neurology - holding for now  BP goal per neurology Holding metoprolol  RENAL A:   Hypokalemia - resolved Hypomagnesemia P:   Replete electrolytes PRN  Follow BMP  GASTROINTESTINAL A:   Nutrition P:   NPO PPI  HEMATOLOGIC A:   Subtherapeutic INR P:  Anticoagulation per  neurology  INFECTIOUS A:   No acute issues P:   Monitor clinically  ENDOCRINE A:   Hypothryoid P:   Follow glucose on chemistry Continue synthroid  NEUROLOGIC A:   CVA s/p systemic TPA and arteriogram with no intervention.  P:   Per neurology   Penny Pia, MD 06/22/2017  10:02 AM Pager: [phone] or [phone]

## 2017-06-22 NOTE — Progress Notes (Signed)
Physical Therapy Treatment Patient Details Name: Ronald [name] MRN: [id number] DOB: 1941-02-08 Today's Date: 06/22/2017    History of Present Illness 77 y.o. male with PMH of AFIB , Hx of CVA, HTN and Hypothyroidism who presents with acute left-sided weakness , CT head hemorrhagic right MCA and PCA infarct following IV TPA    PT Comments    Pt moving well with continued left inattention able to move bil UE and LE for functional tasks with increased time and decreased awareness of safety. Pt speaking throughout session but very difficult to understand but following simple commands consistently with increased time. Pt with improved tolerance today but continues to fatigue with activity. Daughter present for end of session. Pt on 15L HFNC and remained >90%SpO2 throughout session. HR 102-122, BP 144/51   Follow Up Recommendations  CIR;Supervision/Assistance - 24 hour     Equipment Recommendations  Rolling walker with 5" wheels    Recommendations for Other Services       Precautions / Restrictions Precautions Precautions: Fall Precaution Comments: watch O2 saturations and HR    Mobility  Bed Mobility Overal bed mobility: Needs Assistance Bed Mobility: Supine to Sit     Supine to sit: Min assist     General bed mobility comments: multimodal cues to initiate transfer to EoB with cues to attend to left side  Transfers Overall transfer level: Needs assistance   Transfers: Sit to/from Stand;Stand Pivot Transfers Sit to Stand: Min assist;+2 physical assistance Stand pivot transfers: Min assist;+2 safety/equipment       General transfer comment: cues and HHA to stand from bed and recliner x 3 trials total with cues for safety, sequence and attending to left side. Pt walked 3' forward and back at chair x 2 trials then reported fatigue. Unable to progress ambulation further due to portable monitor not pairing and working correctly as well as pt  fatigue  Ambulation/Gait Ambulation/Gait assistance: Min assist;+2 safety/equipment Ambulation Distance (Feet): 6 Feet Assistive device: 2 person hand held assist Gait Pattern/deviations: Step-through pattern;Decreased stride length   Gait velocity interpretation: Below normal speed for age/gender General Gait Details: cues for position and safety with bil UE assist to step forward and back   Stairs            Wheelchair Mobility    Modified Rankin (Stroke Patients Only) Modified Rankin (Stroke Patients Only) Pre-Morbid Rankin Score: Slight disability Modified Rankin: Moderately severe disability     Balance Overall balance assessment: Needs assistance   Sitting balance-Leahy Scale: Fair       Standing balance-Leahy Scale: Poor                              Cognition Arousal/Alertness: Awake/alert Behavior During Therapy: Flat affect Overall Cognitive Status: Difficult to assess                     Current Attention Level: Sustained   Following Commands: Follows one step commands consistently;Follows one step commands with increased time Safety/Judgement: Decreased awareness of safety;Decreased awareness of deficits   Problem Solving: Slow processing;Difficulty sequencing;Requires verbal cues;Requires tactile cues General Comments: pt with decreased initiation with increased cues and times to perform. Pt with nearly unintelligible speech so difficult to fully assess cognition      Exercises      General Comments        Pertinent Vitals/Pain Pain Assessment: No/denies pain Faces Pain Scale: No hurt  Home Living                      Prior Function            PT Goals (current goals can now be found in the care plan section) Progress towards PT goals: Progressing toward goals    Frequency           PT Plan Current plan remains appropriate    Co-evaluation              AM-PAC PT "6 Clicks" Daily  Activity  Outcome Measure  Difficulty turning over in bed (including adjusting bedclothes, sheets and blankets)?: A Lot Difficulty moving from lying on back to sitting on the side of the bed? : Unable Difficulty sitting down on and standing up from a chair with arms (e.g., wheelchair, bedside commode, etc,.)?: Unable Help needed moving to and from a bed to chair (including a wheelchair)?: A Lot Help needed walking in hospital room?: A Lot Help needed climbing 3-5 steps with a railing? : A Lot 6 Click Score: 10    End of Session Equipment Utilized During Treatment: Gait belt;Oxygen Activity Tolerance: Patient tolerated treatment well Patient left: in chair;with call bell/phone within reach;with chair alarm set;with family/visitor present Nurse Communication: Mobility status PT Visit Diagnosis: Difficulty in walking, not elsewhere classified (R26.2);Other symptoms and signs involving the nervous system (R29.898);Other abnormalities of gait and mobility (R26.89)     Time: 4098-1191 PT Time Calculation (min) (ACUTE ONLY): 23 min  Charges:  $Gait Training: 8-22 mins $Therapeutic Activity: 8-22 mins                    G Codes:       Delaney Meigs, PT [phone]    Kaiesha Tonner B Mikelle Myrick 06/22/2017, 1:04 PM

## 2017-06-23 ENCOUNTER — Inpatient Hospital Stay (HOSPITAL_COMMUNITY)
Admission: RE | Admit: 2017-06-23 | Discharge: 2017-07-08 | DRG: 056 | Disposition: A | Discharge status: Home Health | Payer: Medicare Other | Source: Intra-hospital | Attending: Physical Medicine & Rehabilitation | Admitting: Physical Medicine & Rehabilitation

## 2017-06-23 ENCOUNTER — Encounter (HOSPITAL_COMMUNITY): Payer: Self-pay

## 2017-06-23 ENCOUNTER — Other Ambulatory Visit: Payer: Self-pay

## 2017-06-23 DIAGNOSIS — M25511 Pain in right shoulder: Secondary | ICD-10-CM | POA: Diagnosis not present

## 2017-06-23 DIAGNOSIS — I69354 Hemiplegia and hemiparesis following cerebral infarction affecting left non-dominant side: Principal | ICD-10-CM

## 2017-06-23 DIAGNOSIS — Z515 Encounter for palliative care: Secondary | ICD-10-CM

## 2017-06-23 DIAGNOSIS — I10 Essential (primary) hypertension: Secondary | ICD-10-CM | POA: Diagnosis present

## 2017-06-23 DIAGNOSIS — E785 Hyperlipidemia, unspecified: Secondary | ICD-10-CM | POA: Diagnosis present

## 2017-06-23 DIAGNOSIS — Z79899 Other long term (current) drug therapy: Secondary | ICD-10-CM | POA: Diagnosis not present

## 2017-06-23 DIAGNOSIS — I6389 Other cerebral infarction: Secondary | ICD-10-CM | POA: Diagnosis not present

## 2017-06-23 DIAGNOSIS — K8020 Calculus of gallbladder without cholecystitis without obstruction: Secondary | ICD-10-CM | POA: Diagnosis not present

## 2017-06-23 DIAGNOSIS — E039 Hypothyroidism, unspecified: Secondary | ICD-10-CM | POA: Diagnosis present

## 2017-06-23 DIAGNOSIS — Z79890 Hormone replacement therapy (postmenopausal): Secondary | ICD-10-CM | POA: Diagnosis not present

## 2017-06-23 DIAGNOSIS — I69322 Dysarthria following cerebral infarction: Secondary | ICD-10-CM | POA: Diagnosis not present

## 2017-06-23 DIAGNOSIS — Z931 Gastrostomy status: Secondary | ICD-10-CM

## 2017-06-23 DIAGNOSIS — I4891 Unspecified atrial fibrillation: Secondary | ICD-10-CM | POA: Diagnosis present

## 2017-06-23 DIAGNOSIS — I469 Cardiac arrest, cause unspecified: Secondary | ICD-10-CM

## 2017-06-23 DIAGNOSIS — R1310 Dysphagia, unspecified: Secondary | ICD-10-CM | POA: Diagnosis present

## 2017-06-23 DIAGNOSIS — F17210 Nicotine dependence, cigarettes, uncomplicated: Secondary | ICD-10-CM | POA: Diagnosis present

## 2017-06-23 DIAGNOSIS — I619 Nontraumatic intracerebral hemorrhage, unspecified: Secondary | ICD-10-CM | POA: Diagnosis not present

## 2017-06-23 DIAGNOSIS — J690 Pneumonitis due to inhalation of food and vomit: Secondary | ICD-10-CM | POA: Diagnosis present

## 2017-06-23 DIAGNOSIS — Z7901 Long term (current) use of anticoagulants: Secondary | ICD-10-CM

## 2017-06-23 DIAGNOSIS — J449 Chronic obstructive pulmonary disease, unspecified: Secondary | ICD-10-CM | POA: Diagnosis present

## 2017-06-23 DIAGNOSIS — I69320 Aphasia following cerebral infarction: Secondary | ICD-10-CM

## 2017-06-23 DIAGNOSIS — R1312 Dysphagia, oropharyngeal phase: Secondary | ICD-10-CM

## 2017-06-23 DIAGNOSIS — I169 Hypertensive crisis, unspecified: Secondary | ICD-10-CM | POA: Diagnosis not present

## 2017-06-23 DIAGNOSIS — I482 Chronic atrial fibrillation: Secondary | ICD-10-CM | POA: Diagnosis not present

## 2017-06-23 DIAGNOSIS — I639 Cerebral infarction, unspecified: Secondary | ICD-10-CM | POA: Diagnosis not present

## 2017-06-23 DIAGNOSIS — I611 Nontraumatic intracerebral hemorrhage in hemisphere, cortical: Secondary | ICD-10-CM | POA: Diagnosis not present

## 2017-06-23 DIAGNOSIS — R633 Feeding difficulties: Secondary | ICD-10-CM | POA: Diagnosis not present

## 2017-06-23 DIAGNOSIS — I69220 Aphasia following other nontraumatic intracranial hemorrhage: Secondary | ICD-10-CM | POA: Diagnosis not present

## 2017-06-23 DIAGNOSIS — J439 Emphysema, unspecified: Secondary | ICD-10-CM | POA: Diagnosis present

## 2017-06-23 DIAGNOSIS — J9690 Respiratory failure, unspecified, unspecified whether with hypoxia or hypercapnia: Secondary | ICD-10-CM

## 2017-06-23 DIAGNOSIS — R609 Edema, unspecified: Secondary | ICD-10-CM | POA: Diagnosis not present

## 2017-06-23 LAB — CBC WITH DIFFERENTIAL/PLATELET
BASOS PCT: 1 %
Basophils Absolute: 0 10*3/uL (ref 0.0–0.1)
Eosinophils Absolute: 0.1 10*3/uL (ref 0.0–0.7)
Eosinophils Relative: 1 %
HCT: 34.3 % — ABNORMAL LOW (ref 39.0–52.0)
HEMOGLOBIN: 12.3 g/dL — AB (ref 13.0–17.0)
Lymphocytes Relative: 11 %
Lymphs Abs: 0.8 10*3/uL (ref 0.7–4.0)
MCH: 35.4 pg — ABNORMAL HIGH (ref 26.0–34.0)
MCHC: 35.9 g/dL (ref 30.0–36.0)
MCV: 98.8 fL (ref 78.0–100.0)
MONO ABS: 0.4 10*3/uL (ref 0.1–1.0)
MONOS PCT: 5 %
NEUTROS ABS: 6.5 10*3/uL (ref 1.7–7.7)
NEUTROS PCT: 82 %
Platelets: 140 10*3/uL — ABNORMAL LOW (ref 150–400)
RBC: 3.47 MIL/uL — ABNORMAL LOW (ref 4.22–5.81)
RDW: 13.5 % (ref 11.5–15.5)
WBC: 7.9 10*3/uL (ref 4.0–10.5)

## 2017-06-23 LAB — PHOSPHORUS: Phosphorus: 3.1 mg/dL (ref 2.5–4.6)

## 2017-06-23 LAB — MAGNESIUM: Magnesium: 2 mg/dL (ref 1.7–2.4)

## 2017-06-23 LAB — BASIC METABOLIC PANEL
Anion gap: 13 (ref 5–15)
BUN: 8 mg/dL (ref 6–20)
CALCIUM: 8.8 mg/dL — AB (ref 8.9–10.3)
CHLORIDE: 108 mmol/L (ref 101–111)
CO2: 19 mmol/L — AB (ref 22–32)
CREATININE: 0.6 mg/dL — AB (ref 0.61–1.24)
GFR calc non Af Amer: >60 mL/min (ref >60)
Glucose, Bld: 93 mg/dL (ref 65–99)
Potassium: 3.5 mmol/L (ref 3.5–5.1)
Sodium: 140 mmol/L (ref 135–145)

## 2017-06-23 LAB — TSH: TSH: 3.616 u[IU]/mL (ref 0.350–4.500)

## 2017-06-23 MED ORDER — ACETAMINOPHEN 160 MG/5ML PO SOLN
650.0000 mg | ORAL | Status: DC | PRN
  Administered 2017-06-23 – 2017-07-06 (×21): 650 mg
  Filled 2017-06-23 (×22): qty 20.3

## 2017-06-23 MED ORDER — PANTOPRAZOLE SODIUM 40 MG PO TBEC: 40.0000 mg | DELAYED_RELEASE_TABLET | Freq: Every day | ORAL | Status: DC

## 2017-06-23 MED ORDER — JEVITY 1.2 CAL PO LIQD
ORAL | Status: AC
  Filled 2017-06-23: qty 237

## 2017-06-23 MED ORDER — ACETAMINOPHEN 325 MG PO TABS: 650.0000 mg | ORAL_TABLET | ORAL | Status: DC | PRN

## 2017-06-23 MED ORDER — FREE WATER
200.0000 mL | Freq: Three times a day (TID) | Status: DC
Start: 2017-06-23 — End: 2017-06-24
  Administered 2017-06-23 – 2017-06-24 (×3): 200 mL

## 2017-06-23 MED ORDER — PANTOPRAZOLE SODIUM 40 MG PO PACK
40.0000 mg | PACK | Freq: Every day | ORAL | Status: DC
  Administered 2017-06-23 – 2017-07-07 (×14): 40 mg
  Filled 2017-06-23 (×6): qty 20

## 2017-06-23 MED ORDER — JEVITY 1.2 CAL PO LIQD
65.0000 mL/h | ORAL | Status: DC
  Administered 2017-06-23: 35 mL/h

## 2017-06-23 MED ORDER — LEVOTHYROXINE SODIUM 75 MCG PO TABS
62.5000 ug | ORAL_TABLET | Freq: Every day | ORAL | Status: DC
  Administered 2017-06-24 – 2017-06-30 (×7): 62.5 ug
  Filled 2017-06-23 (×7): qty 1

## 2017-06-23 MED ORDER — ACETAMINOPHEN 650 MG RE SUPP: 650.0000 mg | RECTAL | Status: DC | PRN

## 2017-06-23 MED ORDER — JEVITY 1.2 CAL PO LIQD
1000.0000 mL | ORAL | Status: DC
  Filled 2017-06-23 (×2): qty 1000

## 2017-06-23 MED ORDER — ASPIRIN 81 MG PO CHEW
81.0000 mg | CHEWABLE_TABLET | Freq: Every day | ORAL | Status: DC
  Administered 2017-06-23: 81 mg via ORAL
  Filled 2017-06-23: qty 1

## 2017-06-23 MED ORDER — ATORVASTATIN CALCIUM 40 MG PO TABS
40.0000 mg | ORAL_TABLET | Freq: Every day | ORAL | Status: DC
  Administered 2017-06-23 – 2017-07-07 (×14): 40 mg
  Filled 2017-06-23 (×14): qty 1

## 2017-06-23 MED ORDER — ADULT MULTIVITAMIN LIQUID CH
15.0000 mL | Freq: Every day | ORAL | Status: DC
  Administered 2017-06-23: 15 mL
  Filled 2017-06-23: qty 15

## 2017-06-23 MED ORDER — METOPROLOL TARTRATE 25 MG PO TABS
12.5000 mg | ORAL_TABLET | Freq: Two times a day (BID) | ORAL | Status: DC
  Administered 2017-06-23: 12.5 mg via ORAL
  Filled 2017-06-23: qty 1

## 2017-06-23 MED ORDER — AMPICILLIN-SULBACTAM SODIUM 3 (2-1) G IJ SOLR
3.0000 g | Freq: Four times a day (QID) | INTRAMUSCULAR | Status: DC
  Administered 2017-06-23 – 2017-07-03 (×38): 3 g via INTRAVENOUS
  Filled 2017-06-23 (×41): qty 3

## 2017-06-23 MED ORDER — ALBUTEROL SULFATE (2.5 MG/3ML) 0.083% IN NEBU
2.5000 mg | INHALATION_SOLUTION | RESPIRATORY_TRACT | Status: DC | PRN
  Administered 2017-06-23 – 2017-07-06 (×2): 2.5 mg via RESPIRATORY_TRACT
  Filled 2017-06-23 (×3): qty 3

## 2017-06-23 MED ORDER — PANTOPRAZOLE SODIUM 40 MG IV SOLR: 40.0000 mg | Freq: Every day | INTRAVENOUS | Status: DC

## 2017-06-23 MED ORDER — GUAIFENESIN 100 MG/5ML PO SOLN
5.0000 mL | ORAL | Status: DC
  Administered 2017-06-23 – 2017-07-08 (×79): 100 mg
  Filled 2017-06-23 (×14): qty 5
  Filled 2017-06-23: qty 10
  Filled 2017-06-23 (×45): qty 5
  Filled 2017-06-23: qty 25
  Filled 2017-06-23 (×18): qty 5

## 2017-06-23 MED ORDER — PRO-STAT SUGAR FREE PO LIQD
30.0000 mL | Freq: Every day | ORAL | Status: DC
  Administered 2017-06-23: 30 mL
  Filled 2017-06-23: qty 30

## 2017-06-23 NOTE — Progress Notes (Signed)
Chart reviewed and staffed with CIR representative, as well as L. Readling, leadership with Palliative Medicine Team. Pt goals set, going for CIR, Cortrak being placed now. Recommend if pt does not progress in CIR, palliative be reconsulted for GOC discussion. Palliative medicine to sign off. Feel free to re-consult us should pt's goals change Eduard RouxSarah Carlene Bickley, ANP

## 2017-06-23 NOTE — Progress Notes (Signed)
Patient arrived to unit via bed accompanied by Daughter Ronald [name](Debbie). Patient alert and oriented to person (Unable to obtain complete orientation assessment due to Intelligible Speech). Patient nods head yes or no to simple questions. Continues 02 in place via [place]. Skin assessment rendered x 2 RN. No s/s distress noted at current time.

## 2017-06-23 NOTE — Progress Notes (Signed)
Inpatient Rehabilitation  Met with patient, roommate Stanton Kidney, and daughter Jackelyn Poling at bedside to discuss team's recommendation for IP Rehab.  Shared booklets, insurance verification letter, and answered questions.  They are eager for patient to regain his independence and he has excellent support.  I have received medical clearance and have a bed to offer.  Plan to proceed with admission today.  Call if questions.    Carmelia Roller., CCC/SLP Admission Coordinator  [place]  Cell [phone]

## 2017-06-23 NOTE — Progress Notes (Signed)
Initial Nutrition Assessment  DOCUMENTATION CODES:   Non-severe (moderate) malnutrition in context of chronic illness  INTERVENTION:   Jevity 1.2 start at 35 ml/hr and increase by 10 ml every 8 hr to goal rate of 65 ml/hr (1560 ml/day) 30 ml Prostat daily  Provides: 1972 kcal, 101 grams protein, and 1265 ml free water  (Once admitted to rehab recommend 78 ml/hr x 20 hrs)  NUTRITION DIAGNOSIS:   Moderate Malnutrition related to chronic illness as evidenced by mild fat depletion, moderate fat depletion, mild muscle depletion, moderate muscle depletion.  GOAL:   Patient will meet greater than or equal to 90% of their needs  MONITOR:   Diet advancement, TF tolerance, Weight trends  REASON FOR ASSESSMENT:   Consult Enteral/tube feeding initiation and management  ASSESSMENT:   Pt with PMH of L MCA stroke, afib on coumadin with subtherapeutic INR, HTN, COPD, CAD, hypothyroidism admitted with R M2 occlusion s/p tPA and arteriogram.    Pt discussed during ICU rounds and with RN.  Pt's speech is unintelligible. Daughter at bedside and gives hx. Per her account pt has never been a big man at most 200 lb but usually just under this. He lives with a friend. Pt keeps busy and he does most of the cooking. Pt has never really ate much for Breakfast or dinner but does eat a full dinner. He goes to bed early so does not really snack much. He has dentures but does not use them to eat. He has always drank beer but has cut down a lot. He only drinks about 3-4 beers per day now and this is usually during the day and after dinner. Per daughter pt lost weight but she is unsure of time frame or how much. She does state that his PCP threatened to hospitalize him if he did not gain weight. He had gained 3 lb at his last PCP visit.    Plan to d/c to CIR this afternoon  Medications reviewed and include: synthroid Labs reviewed  NUTRITION - FOCUSED PHYSICAL EXAM:    Most Recent Value  Orbital Region   Mild depletion  Upper Arm Region  Moderate depletion  Thoracic and Lumbar Region  Moderate depletion  Buccal Region  Mild depletion  Temple Region  Mild depletion  Clavicle Bone Region  Mild depletion  Clavicle and Acromion Bone Region  Moderate depletion  Scapular Bone Region  Unable to assess  Dorsal Hand  Moderate depletion  Patellar Region  Moderate depletion  Anterior Thigh Region  Moderate depletion  Posterior Calf Region  Moderate depletion  Edema (RD Assessment)  None  Hair  Reviewed  Eyes  Reviewed  Mouth  Reviewed  Skin  Reviewed  Nails  Reviewed       Diet Order:  Check puncture sites for bleeding or hematomas. Bleeding precautions Fall precautions Diet NPO time specified  EDUCATION NEEDS:   Education needs have been addressed  Skin:  Skin Assessment: Reviewed RN Assessment  Last BM:  unknown  Height:   Ht Readings from Last 1 Encounters:  06/19/17 5\' 11"  (1.803 m)    Weight:   Wt Readings from Last 1 Encounters:  06/19/17 159 lb 13.3 oz (72.5 kg)    Ideal Body Weight:  78.1 kg  BMI:  Body mass index is 22.29 kg/m.  Estimated Nutritional Needs:   Kcal:  1900-2100  Protein:  90-115 grams  Fluid:  > 1.8 L/day   Kendell BaneHeather Jonah Gingras RD, LDN, CNSC [phone] Pager [phone] After Hours  Pager

## 2017-06-23 NOTE — H&P (Signed)
Physical Medicine and Rehabilitation Admission H&P    Chief Complaint  Patient presents with  . Code Stroke  : HPI: Ronald [name] is a 77 y.o. 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 Per chart review patient lives with a male companion and reportedly mobile and independent. Plans to stay with daughter on discharge. Cranial CT scan negative for acute changes. 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 Patient did  receive TPA. INR on admission of 1.23. CT angiogram head and neck showed apparent occlusion of right insular anterior temporal M2 branch. CT cerebral perfusion scan showed acute changes in the right MCA territory compatible with infarction affecting the right inferior temporal lobe and right inferior parietal lobe. Echocardiogram with ejection fraction of 60% no wall motion abnormalities. Carotid arteriogram completed 06/19/2017 showing no acute occlusions, AV shunting or aneurysm noted. Patient did require short-term intubation. MRI of the brain 06/20/2017 showed a 3.2 cm acute hemorrhage within the right lateral temporal lobe. CT of the head follow-up 06/22/2017 shows evolving multifocal right MCA territory infarct with evolving focal hemorrhagic conversion right temporal lobe. Evolving nonhemorrhagic right PCA territory infarct. Aspirin for CVA prophylaxis currently on hold due to Opp.  Cortrak tube placed for nutritional support. Completing a course of Unasyn for aspiration pneumonia. Physical and occupational therapy evaluations completed 06/21/2017 with recommendations of physical medicine rehabilitation consult. Patient was admitted for a comprehensive rehabilitation program    Review of Systems  Unable to perform ROS: Language   Past Medical History:  Diagnosis Date  . COPD  (chronic obstructive pulmonary disease) ([place])   . Emphysema lung ([place])   . Hx of long term use of blood thinners   . Hypertension   . Stroke [facility]) 2013  . Thyroid disease    hypothyroidism   Past Surgical History:  Procedure Laterality Date  . IR ANGIO EXTRACRAN SEL COM CAROTID INNOMINATE UNI R MOD SED  06/19/2017  . IR ANGIO VERTEBRAL SEL SUBCLAVIAN INNOMINATE UNI R MOD SED  06/19/2017  . RADIOLOGY WITH ANESTHESIA N/A 06/19/2017   Procedure: RADIOLOGY WITH ANESTHESIA;  Surgeon: Luanne Bras, MD;  Location: [facility];  Service: Radiology;  Laterality: N/A;   History reviewed. No pertinent family history. Social History:  reports that he has been smoking cigarettes.  He has been smoking about 1.00 pack per day. He does not have any smokeless tobacco history on file. He reports that he drinks alcohol. He reports that he does not use drugs. Allergies: No Known Allergies Medications Prior to Admission  Medication Sig Dispense Refill  . Aspirin-Salicylamide-Caffeine (ARTHRITIS STRENGTH BC POWDER PO) Take 1 packet by mouth every hour.    . gabapentin (NEURONTIN) 100 MG capsule Take 100 mg by mouth 3 (three) times daily as needed (back pain/spasms).   2  . levothyroxine (SYNTHROID, LEVOTHROID) 125 MCG tablet Take 125 mcg by mouth daily after supper.   3  . metoprolol tartrate (LOPRESSOR) 50 MG tablet Take 50 mg by mouth daily after supper.   3  . warfarin (COUMADIN) 5 MG tablet Take 5 mg by mouth daily after supper.   2    Drug Regimen Review Drug regimen was reviewed and remains appropriate with no significant issues identified  Home: Home Living Family/patient expects to be discharged to:: Inpatient rehab Additional Comments: Pt lived with male friend, who is elderly.   Pt  will likely stay with daughter after discharge from rehab facility    Functional History: Prior Function Level of Independence: Independent Comments: history of CVA but per daughter was mobilizing well, most  residual impairments were speech related. Daughter does note that patient does not read or write at baseline  Functional Status:  Mobility: Bed Mobility Overal bed mobility: Needs Assistance Bed Mobility: Supine to Sit, Sit to Supine Supine to sit: Min assist Sit to supine: Min assist General bed mobility comments: requires increased time and assist to move LEs  off of and to EOB  Transfers Overall transfer level: Needs assistance Equipment used: 2 person hand held assist(with wrap around support) Transfers: Sit to/from Stand Sit to Stand: Mod assist Stand pivot transfers: Min assist, +2 safety/equipment General transfer comment: assist for balance.  Attempted to side step up side of bed, but pt took one half step and returned to sitting  Ambulation/Gait Ambulation/Gait assistance: Min assist, +2 safety/equipment Ambulation Distance (Feet): 6 Feet Assistive device: 2 person hand held assist Gait Pattern/deviations: Step-through pattern, Decreased stride length General Gait Details: cues for position and safety with bil UE assist to step forward and back Gait velocity interpretation: Below normal speed for age/gender    ADL: ADL Overall ADL's : Needs assistance/impaired Eating/Feeding: NPO Grooming: Brushing hair, Moderate assistance, Sitting Grooming Details (indicate cue type and reason): requires assist to comb left side.  he will place comb in Lt UE and attempt to lift it to head, but unable to complete task, then looses attention, and requires mod - max A to redirect  Upper Body Bathing: Maximal assistance, Sitting Lower Body Bathing: Sit to/from stand, Maximal assistance Upper Body Dressing : Maximal assistance, Sitting Lower Body Dressing: Total assistance, Sit to/from stand Toilet Transfer: Moderate assistance, Stand-pivot, BSC Toilet Transfer Details (indicate cue type and reason): Pt unable to locate [facility] on his left, and required max multimodal cues to turn that  direction and to sit  Toileting- Clothing Manipulation and Hygiene: Total assistance, Sit to/from stand Functional mobility during ADLs: Moderate assistance  Cognition: Cognition Overall Cognitive Status: Difficult to assess Arousal/Alertness: Awake/alert Orientation Level: Oriented to person Attention: Sustained Sustained Attention: Impaired Sustained Attention Impairment: Verbal basic Comments: to be evaluated more completely as communication improves Cognition Arousal/Alertness: Awake/alert Behavior During Therapy: Flat affect Overall Cognitive Status: Difficult to assess Area of Impairment: Attention, Following commands, Safety/judgement, Problem solving Current Attention Level: Sustained Following Commands: Follows one step commands inconsistently, Follows one step commands with increased time Safety/Judgement: Decreased awareness of safety, Decreased awareness of deficits Awareness: Intellectual Problem Solving: Slow processing, Decreased initiation, Difficulty sequencing, Requires verbal cues, Requires tactile cues General Comments: Pt demonstrates up to 15 second delay in response time  Difficult to assess due to: Impaired communication  Physical Exam: Blood pressure 127/62, pulse 93, temperature 97.6 F (36.4 C), temperature source Oral, resp. rate (!) 26, height 5' 11" (1.803 m), weight 72.5 kg (159 lb 13.3 oz), SpO2 (!) 87 %. Physical Exam Vitals reviewed. Gen: alert, no distress HENT: oral mucosa dry Cortrak tube in place Eyes:  Pupils reactive to light . No discharge Neck: Normal range of motion. Neck supple. No thyromegaly present.  Cardiovascular: IRR/IRR Cardiac rate controlled  Respiratory:  Limited inspiratory effort with upper airway congestion/rhonchi noted.  GI: Soft. Bowel sounds are normal. He exhibits no distension/nontender Skin. Warm and dry with a few scattered ecchymoses Neurological:  Right gaze preference. Poor oral-motor control. Attempts to  protrude tongue forward but unable move past lips.  Speech very dysarthric. Difficulty assessing language due to dysarthria.  Restless. Moves all 4's. RUE and RLE 4/5. LUE and LLE grossly 3+ to 4/5 with inattention and motor planning issues. Senses pain R>L limbs. DTR's 1+ Psych: restless, occasionally agitated    Results for orders placed or performed during the hospital encounter of 06/19/17 (from the past 48 hour(s))  Basic metabolic panel     Status: Abnormal   Collection Time: 06/21/17 12:35 PM  Result Value Ref Range   Sodium 137 135 - 145 mmol/L   Potassium 4.0 3.5 - 5.1 mmol/L   Chloride 105 101 - 111 mmol/L   CO2 24 22 - 32 mmol/L   Glucose, Bld 112 (H) 65 - 99 mg/dL   BUN 7 6 - 20 mg/dL   Creatinine, Ser 0.64 0.61 - 1.24 mg/dL   Calcium 8.7 (L) 8.9 - 10.3 mg/dL   GFR calc non Af Amer >60 >60 mL/min   GFR calc Af Amer >60 >60 mL/min    Comment: (NOTE) The eGFR has been calculated using the CKD EPI equation. This calculation has not been validated in all clinical situations. eGFR's persistently <60 mL/min signify possible Chronic Kidney Disease.    Anion gap 8 5 - 15    Comment: Performed at [facility] [address] [place], [place] [address]  Magnesium     Status: None   Collection Time: 06/21/17 12:35 PM  Result Value Ref Range   Magnesium 2.0 1.7 - 2.4 mg/dL    Comment: Performed at [facility], [place] [address] [place], [place] [address]  Phosphorus     Status: Abnormal   Collection Time: 06/21/17 12:35 PM  Result Value Ref Range   Phosphorus 2.2 (L) 2.5 - 4.6 mg/dL    Comment: Performed at [facility] [address], [place], [place] [address]  Basic metabolic panel     Status: Abnormal   Collection Time: 06/22/17  3:55 AM  Result Value Ref Range   Sodium 137 135 - 145 mmol/L   Potassium 3.8 3.5 - 5.1 mmol/L   Chloride 104 101 - 111 mmol/L   CO2 23 22 - 32 mmol/L   Glucose, Bld 113 (H) 65 - 99 mg/dL   BUN 7 6 - 20 mg/dL   Creatinine,  Ser 0.67 0.61 - 1.24 mg/dL   Calcium 8.7 (L) 8.9 - 10.3 mg/dL   GFR calc non Af Amer >60 >60 mL/min   GFR calc Af Amer >60 >60 mL/min    Comment: (NOTE) The eGFR has been calculated using the CKD EPI equation. This calculation has not been validated in all clinical situations. eGFR's persistently <60 mL/min signify possible Chronic Kidney Disease.    Anion gap 10 5 - 15    Comment: Performed at [facility] [address], [place], [place] [address]  Magnesium     Status: None   Collection Time: 06/22/17  3:55 AM  Result Value Ref Range   Magnesium 2.0 1.7 - 2.4 mg/dL    Comment: Performed at [facility] [address] [place], [place] [address]  Phosphorus     Status: None   Collection Time: 06/22/17  3:55 AM  Result Value Ref Range   Phosphorus 2.6 2.5 - 4.6 mg/dL    Comment: Performed at [facility] [address] [place], [place] [address]   Ct Head Wo Contrast  Result Date: 06/22/2017 CLINICAL DATA:  Follow-up stroke. EXAM:  CT HEAD WITHOUT CONTRAST TECHNIQUE: Contiguous axial images were obtained from the base of the skull through the vertex without intravenous contrast. COMPARISON:  CT HEAD June 19, 2017 and MRI of the head June 20, 2016 FINDINGS: BRAIN: Evolving 3 x 1.9 cm RIGHT temporal acute hematoma with surrounding vasogenic and cytotoxic edema. Wedge-like cytotoxic edema RIGHT occipital lobe extending to RIGHT parietal lobe and RIGHT periatrial white matter. Focal cyst cytotoxic edema to lesser extent RIGHT frontal lobe including insula, RIGHT basal ganglia. Old LEFT frontal lobe infarct. No midline shift. No parenchymal brain volume loss for age. No hydrocephalus. Patchy white matter changes exclusive a aforementioned abnormality compatible with moderate chronic small vessel ischemic disease. No abnormal extra-axial fluid collections. Basal cisterns are patent. VASCULAR: Mild calcific atherosclerosis of the carotid siphons. SKULL: No skull  fracture. No significant scalp soft tissue swelling. SINUSES/ORBITS: Mild paranasal sinus mucosal thickening with small RIGHT maxillary sinus air-fluid level. Mastoid air cells are well aerated.The included ocular globes and orbital contents are non-suspicious. OTHER: Patient is edentulous. IMPRESSION: 1. Evolving multifocal acute RIGHT MCA territory infarct with evolving focal hemorrhagic conversion RIGHT temporal lobe. 2. Evolving nonhemorrhagic RIGHT PCA territory infarct. 3. Old LEFT frontal lobe/MCA territory infarct. Electronically Signed   By: Elon Alas M.D.   On: 06/22/2017 04:29   Dg Chest Portable 1 View  Result Date: 06/22/2017 CLINICAL DATA:  Respiratory failure EXAM: PORTABLE CHEST 1 VIEW COMPARISON:  06/20/2017 FINDINGS: Cardiac shadow remains enlarged. Postsurgical changes are again noted. The endotracheal tube has been removed. Persistent changes are noted in the left mid and lower lung. Increasing right basilar atelectasis and effusion is seen. No bony abnormality is noted. IMPRESSION: Persistent changes in the left base. Increasing basilar atelectasis and effusion on the right. Electronically Signed   By: Inez Catalina M.D.   On: 06/22/2017 07:37       Medical Problem List and Plan: 1.  Left side weakness secondary to right temporoparietal infarct status post TPA with hemorrhagic conversion as well as history of left MCA infarction with residual aphasia  -admit to inpatient rehab 2.  DVT Prophylaxis/Anticoagulation: SCD's only given hemorrhage. Monitor for signs of DVT   -check dopplers 3. Pain Management: Tylenol as needed 4. Mood: Provide emotional support 5. Neuropsych: This patient is capable of making decisions on his own behalf. 6. Skin/Wound Care: Routine skin checks 7. Fluids/Electrolytes/Nutrition: Routine I&O with follow up chemistries 8. Dysphagia.NPO. Cortrak for nutritional support. Follow-up speech therapy 9. Aspiration pneumonia. Completing course of  Unasyn   -continue aspiration precautions 10. Hypothyroidism. Synthroid 11.Hyperlipidemia. Lipitor 12. Atrial fibrillation. Cardiac rate controlled. Chronic Coumadin on hold d/t hemorrhage. Await plan to begin aspirin therapy per neurology services 13. Tobacco abuse. Counseling  Post Admission Physician Evaluation: 1. Functional deficits secondary  to right temporal-parietal infarct with hemorrhagic conversion. 2. Patient is admitted to receive collaborative, interdisciplinary care between the physiatrist, rehab nursing staff, and therapy team. 3. Patient's level of medical complexity and substantial therapy needs in context of that medical necessity cannot be provided at a lesser intensity of care such as a SNF. 4. Patient has experienced substantial functional loss from his/her baseline which was documented above under the "Functional History" and "Functional Status" headings.  Judging by the patient's diagnosis, physical exam, and functional history, the patient has potential for functional progress which will result in measurable gains while on inpatient rehab.  These gains will be of substantial and practical use upon discharge  in facilitating mobility and self-care at the household  level. 5. Physiatrist will provide 24 hour management of medical needs as well as oversight of the therapy plan/treatment and provide guidance as appropriate regarding the interaction of the two. 6. The Preadmission Screening has been reviewed and patient status is unchanged unless otherwise stated above. 7. 24 hour rehab nursing will assist with bladder management, bowel management, safety, skin/wound care, disease management, medication administration, pain management and patient education  and help integrate therapy concepts, techniques,education, etc. 8. PT will assess and treat for/with: Lower extremity strength, range of motion, stamina, balance, functional mobility, safety, adaptive techniques and equipment,  NMR, visual-spatial awareness, family education.   Goals are: supervision to min assist. 9. OT will assess and treat for/with: ADL's, functional mobility, safety, upper extremity strength, adaptive techniques and equipment, NMR, visual-spatial awareness, family education.   Goals are: supervision to min assist. Therapy may proceed with showering this patient. 10. SLP will assess and treat for/with: cognition, language, speech, communication, swallowing and family ed.  Goals are: supervision to min assist. 11. Case Management and Social Worker will assess and treat for psychological issues and discharge planning. 12. Team conference will be held weekly to assess progress toward goals and to determine barriers to discharge. 13. Patient will receive at least 3 hours of therapy per day at least 5 days per week. 14. ELOS: 20-25 days       15. Prognosis:  excellent     Meredith Staggers, MD, [facility] 06/23/2017   Lavon Paganini [place], PA-C 06/23/2017

## 2017-06-23 NOTE — PMR Pre-admission (Addendum)
PMR Admission Coordinator Pre-Admission Assessment  Patient: Ronald [name] is an 77 y.o., male MRN: [id number] DOB: 03/30/1941 Height: 5' 11" (180.3 cm) Weight: 72.5 kg (159 lb 13.3 oz)              Insurance Information HMO:     PPO:      PCP:      IPA:      80/20:      OTHER:  PRIMARY: Medicare A & B      Policy#: [id number]      Subscriber: Self CM Name:       Phone#:      Fax#:  Pre-Cert#: Eligible      Employer:  Benefits:  Phone #: Verified online     Name: [employer] Portal Eff. Date: 03/02/93     Deduct: $1364      Out of Pocket Max: N/A      Life Max: N/A CIR: 100&      SNF: 100% days 1-20; 80% days 21-100 Outpatient: 80%     Co-Pay: 20% Home Health: 100%      Co-Pay: $0 DME: 90%     Co-Pay: 20% Providers: Patient's choice  SECONDARY: None; however, patient's daughter reports he has an RX supplement through [employer] that [employer] has  Medicaid Application Date:       Case Manager:  Disability Application Date:       Case Worker:   Emergency Contact Information Contact Information    Name Relation Home Work Mobile   Smith,Debbie Daughter [phone]       Current Medical History  Patient Admitting Diagnosis: Right temporal-parietal infarct  History of Present Illness: Ronald L Digangiis a 77 y.o.right handed malewith history of previous left MCA infarctionwith residual speech difficulty, atrial fibrillation maintained on Coumadin, hypertension, COPD/tobacco abuse, presented2/19/2019with left side weakness in right gaze preference. Per chart review patient lives with a male companion, Maryand reportedly mobile and independent. Plans to stay with daughter on discharge if needed. Cranial CT scan negative for acute changes. 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 Patient did receive TPA. INR on admission of 1.23. CT angiogram head and neck showed apparent occlusion of right insular anterior temporal M2 branch. CT cerebral perfusion scan showed acute changes in the right MCA  territory compatible with infarction affecting the right inferior temporal lobe and right inferior parietal lobe. Echocardiogram with ejection fraction of 60% no wall motion abnormalities. Carotid arteriogram completed 06/19/2017 showing no acute occlusions, AV shunting, or aneurysm noted. Patient did require short-term intubation. MRI of the brain 06/20/2017 showed a 3.2 cm acute hemorrhage within the right lateral temporal lobe. CT of the head follow-up 06/22/2017 shows evolving multifocal right MCA territory infarct with evolving focal hemorrhagic conversion right temporal lobe. Evolving nonhemorrhagic right PCA territory infarct. Aspirin for CVA prophylaxis currently on hold due to [place]. Patient is currently NPO with Cortrak tube for nutritional support. Completing a course of Unasyn for aspiration pneumonia. Physical and occupational therapy evaluations completed 06/21/2017 with recommendations of physical medicine rehabilitation consult. Patient was admitted for a comprehensive rehabilitation program 06/23/17.   NIH Total: 13    Past Medical History  Past Medical History:  Diagnosis Date  . COPD (chronic obstructive pulmonary disease) (HCC)   . Emphysema lung (HCC)   . Hx of long term use of blood thinners   . Hypertension   . Stroke (HCC) 2013  . Thyroid disease    hypothyroidism    Family History  family history is not   PMR Admission Coordinator Pre-Admission Assessment  Patient: Ronald [name] is an 77 y.o., male MRN: [id number] DOB: 1941-01-27 Height: 5' 11" (180.3 cm) Weight: 72.5 kg (159 lb 13.3 oz)              Insurance Information HMO:     PPO:      PCP:      IPA:      80/20:      OTHER:  PRIMARY: Medicare A & B      Policy#: [id number]      Subscriber: Self CM Name:       Phone#:      Fax#:  Pre-Cert#: Eligible      Employer:  Benefits:  Phone #: Verified online     Name: [employer] Portal Eff. Date: 03/02/93     Deduct: $1364      Out of Pocket Max: N/A      Life Max: N/A CIR: 100&      SNF: 100% days 1-20; 80% days 21-100 Outpatient: 80%     Co-Pay: 20% Home Health: 100%      Co-Pay: $0 DME: 90%     Co-Pay: 20% Providers: Patient's choice  SECONDARY: None; however, patient's daughter reports he has an RX supplement through [facility] that [employer] has  Medicaid Application Date:       Case Manager:  Disability Application Date:       Case Worker:   Emergency Contact Information Contact Information    Name Relation Home Work Mobile   Smith,Debbie Daughter [phone]       Current Medical History  Patient Admitting Diagnosis: Right temporal-parietal infarct  History of Present Illness: Ronald Can Burnsis a 77 y.o.right handed malewith history of previous left MCA infarctionwith residual speech difficulty, atrial fibrillation maintained on Coumadin, hypertension, COPD/tobacco abuse, presented2/19/2019with left side weakness in right gaze preference. Per chart review patient lives with a male companion, Essie Christine reportedly mobile and independent. Plans to stay with daughter on discharge if needed. Cranial CT scan negative for acute changes. 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 Patient did receive TPA. INR on admission of 1.23. CT angiogram head and neck showed apparent occlusion of right insular anterior temporal M2 branch. CT cerebral perfusion scan showed acute changes in the right MCA  territory compatible with infarction affecting the right inferior temporal lobe and right inferior parietal lobe. Echocardiogram with ejection fraction of 60% no wall motion abnormalities. Carotid arteriogram completed 06/19/2017 showing no acute occlusions, AV shunting, or aneurysm noted. Patient did require short-term intubation. MRI of the brain 06/20/2017 showed a 3.2 cm acute hemorrhage within the right lateral temporal lobe. CT of the head follow-up 06/22/2017 shows evolving multifocal right MCA territory infarct with evolving focal hemorrhagic conversion right temporal lobe. Evolving nonhemorrhagic right PCA territory infarct. Aspirin for CVA prophylaxis currently on hold due to [place]. Patient is currently NPO with Cortrak tube for nutritional support. Completing a course of Unasyn for aspiration pneumonia. Physical and occupational therapy evaluations completed 06/21/2017 with recommendations of physical medicine rehabilitation consult. Patient was admitted for a comprehensive rehabilitation program 06/23/17.   NIH Total: 13    Past Medical History  Past Medical History:  Diagnosis Date  . COPD (chronic obstructive pulmonary disease) ([place])   . Emphysema lung ([place])   . Hx of long term use of blood thinners   . Hypertension   . Stroke [facility]) 2013  . Thyroid disease    hypothyroidism    Family History  family history is not  PMR Admission Coordinator Pre-Admission Assessment  Patient: Ronald [name] is an 77 y.o., male MRN: [id number] DOB: 1941-01-27 Height: 5' 11" (180.3 cm) Weight: 72.5 kg (159 lb 13.3 oz)              Insurance Information HMO:     PPO:      PCP:      IPA:      80/20:      OTHER:  PRIMARY: Medicare A & B      Policy#: [id number]      Subscriber: Self CM Name:       Phone#:      Fax#:  Pre-Cert#: Eligible      Employer:  Benefits:  Phone #: Verified online     Name: [employer] Portal Eff. Date: 03/02/93     Deduct: $1364      Out of Pocket Max: N/A      Life Max: N/A CIR: 100&      SNF: 100% days 1-20; 80% days 21-100 Outpatient: 80%     Co-Pay: 20% Home Health: 100%      Co-Pay: $0 DME: 90%     Co-Pay: 20% Providers: Patient's choice  SECONDARY: None; however, patient's daughter reports he has an RX supplement through [facility] that [employer] has  Medicaid Application Date:       Case Manager:  Disability Application Date:       Case Worker:   Emergency Contact Information Contact Information    Name Relation Home Work Mobile   Smith,Debbie Daughter [phone]       Current Medical History  Patient Admitting Diagnosis: Right temporal-parietal infarct  History of Present Illness: Ronald Can Burnsis a 77 y.o.right handed malewith history of previous left MCA infarctionwith residual speech difficulty, atrial fibrillation maintained on Coumadin, hypertension, COPD/tobacco abuse, presented2/19/2019with left side weakness in right gaze preference. Per chart review patient lives with a male companion, Essie Christine reportedly mobile and independent. Plans to stay with daughter on discharge if needed. Cranial CT scan negative for acute changes. 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 Patient did receive TPA. INR on admission of 1.23. CT angiogram head and neck showed apparent occlusion of right insular anterior temporal M2 branch. CT cerebral perfusion scan showed acute changes in the right MCA  territory compatible with infarction affecting the right inferior temporal lobe and right inferior parietal lobe. Echocardiogram with ejection fraction of 60% no wall motion abnormalities. Carotid arteriogram completed 06/19/2017 showing no acute occlusions, AV shunting, or aneurysm noted. Patient did require short-term intubation. MRI of the brain 06/20/2017 showed a 3.2 cm acute hemorrhage within the right lateral temporal lobe. CT of the head follow-up 06/22/2017 shows evolving multifocal right MCA territory infarct with evolving focal hemorrhagic conversion right temporal lobe. Evolving nonhemorrhagic right PCA territory infarct. Aspirin for CVA prophylaxis currently on hold due to [place]. Patient is currently NPO with Cortrak tube for nutritional support. Completing a course of Unasyn for aspiration pneumonia. Physical and occupational therapy evaluations completed 06/21/2017 with recommendations of physical medicine rehabilitation consult. Patient was admitted for a comprehensive rehabilitation program 06/23/17.   NIH Total: 13    Past Medical History  Past Medical History:  Diagnosis Date  . COPD (chronic obstructive pulmonary disease) ([place])   . Emphysema lung ([place])   . Hx of long term use of blood thinners   . Hypertension   . Stroke [facility]) 2013  . Thyroid disease    hypothyroidism    Family History  family history is not  PMR Admission Coordinator Pre-Admission Assessment  Patient: Ronald [name] is an 77 y.o., male MRN: [id number] DOB: 1941-01-27 Height: 5' 11" (180.3 cm) Weight: 72.5 kg (159 lb 13.3 oz)              Insurance Information HMO:     PPO:      PCP:      IPA:      80/20:      OTHER:  PRIMARY: Medicare A & B      Policy#: [id number]      Subscriber: Self CM Name:       Phone#:      Fax#:  Pre-Cert#: Eligible      Employer:  Benefits:  Phone #: Verified online     Name: [employer] Portal Eff. Date: 03/02/93     Deduct: $1364      Out of Pocket Max: N/A      Life Max: N/A CIR: 100&      SNF: 100% days 1-20; 80% days 21-100 Outpatient: 80%     Co-Pay: 20% Home Health: 100%      Co-Pay: $0 DME: 90%     Co-Pay: 20% Providers: Patient's choice  SECONDARY: None; however, patient's daughter reports he has an RX supplement through [facility] that [employer] has  Medicaid Application Date:       Case Manager:  Disability Application Date:       Case Worker:   Emergency Contact Information Contact Information    Name Relation Home Work Mobile   Smith,Debbie Daughter [phone]       Current Medical History  Patient Admitting Diagnosis: Right temporal-parietal infarct  History of Present Illness: Ronald Can Burnsis a 77 y.o.right handed malewith history of previous left MCA infarctionwith residual speech difficulty, atrial fibrillation maintained on Coumadin, hypertension, COPD/tobacco abuse, presented2/19/2019with left side weakness in right gaze preference. Per chart review patient lives with a male companion, Essie Christine reportedly mobile and independent. Plans to stay with daughter on discharge if needed. Cranial CT scan negative for acute changes. 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 Patient did receive TPA. INR on admission of 1.23. CT angiogram head and neck showed apparent occlusion of right insular anterior temporal M2 branch. CT cerebral perfusion scan showed acute changes in the right MCA  territory compatible with infarction affecting the right inferior temporal lobe and right inferior parietal lobe. Echocardiogram with ejection fraction of 60% no wall motion abnormalities. Carotid arteriogram completed 06/19/2017 showing no acute occlusions, AV shunting, or aneurysm noted. Patient did require short-term intubation. MRI of the brain 06/20/2017 showed a 3.2 cm acute hemorrhage within the right lateral temporal lobe. CT of the head follow-up 06/22/2017 shows evolving multifocal right MCA territory infarct with evolving focal hemorrhagic conversion right temporal lobe. Evolving nonhemorrhagic right PCA territory infarct. Aspirin for CVA prophylaxis currently on hold due to [place]. Patient is currently NPO with Cortrak tube for nutritional support. Completing a course of Unasyn for aspiration pneumonia. Physical and occupational therapy evaluations completed 06/21/2017 with recommendations of physical medicine rehabilitation consult. Patient was admitted for a comprehensive rehabilitation program 06/23/17.   NIH Total: 13    Past Medical History  Past Medical History:  Diagnosis Date  . COPD (chronic obstructive pulmonary disease) ([place])   . Emphysema lung ([place])   . Hx of long term use of blood thinners   . Hypertension   . Stroke [facility]) 2013  . Thyroid disease    hypothyroidism    Family History  family history is not

## 2017-06-23 NOTE — Progress Notes (Signed)
PMR Admission Coordinator Pre-Admission Assessment  Patient: Ronald [name] is an 77 y.o., male MRN: [id number] DOB: 08-21-1940 Height: 5\' 11"  (180.3 cm) Weight: 72.5 kg (159 lb 13.3 oz)                                                                                                                                                  Insurance Information HMO:     PPO:      PCP:      IPA:      80/20:      OTHER:  PRIMARY: Medicare [name] [employer]      Policy#: [id number] [name]      Subscriber: Self CM Name:       Phone#:      Fax#:  Pre-Cert#: Eligible      Employer:  Benefits:  Phone #: Verified online     Name: Pass[employer] Portal Eff. Date: 03/02/93     Deduct: $1364      Out of Pocket Max: N/[name]      Life Max: N/[name] CIR: 100&      SNF: 100% days 1-20; 80% days 21-100 Outpatient: 80%     Co-Pay: 20% Home Health: 100%      Co-Pay: $0 DME: 90%     Co-Pay: 20% Providers: Patient's choice  SECONDARY: None; however, patient's daughter reports he has an RX supplement through Ronald [name] that Ronald [name] has  Medicaid Application Date:       Case Manager:  Disability Application Date:       Case Worker:   Emergency Contact Information        Contact Information    Name Relation Home Work Mobile   Ronald [name] Daughter [phone]       Current Medical History  Patient Admitting Diagnosis: Right temporal-parietal infarct  History of Present Illness: Ronald Cargile Burnsis [name] 77 y.o.right handed malewith history of previous left MCA infarctionwith residual speech difficulty, atrial fibrillation maintained on Coumadin, hypertension, COPD/tobacco abuse, presented2/19/2019with left side weakness in right gaze preference. Per chart review patient lives with [name] male companion, Ronald [name] reportedly mobile and independent. Plans to stay with daughter on discharge if needed. Cranial CT scan negative for acute changes. 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 Patient did receive TPA. INR on admission of 1.23. CT angiogram head  and neck showed apparent occlusion of right insular anterior temporal M2 branch. CT cerebral perfusion scan showed acute changes in the right MCA territory compatible with infarction affecting the right inferior temporal lobe and right inferior parietal lobe. Echocardiogram with ejection fraction of 60% no wall motion abnormalities. Carotid arteriogram completed 06/19/2017 showing no acute occlusions, AV shunting, or aneurysm noted. Patient did require short-term intubation.MRI of the brain 06/20/2017 showed [name] 3.2 cm acute hemorrhage within the right lateral temporal lobe. CT of the head follow-up 06/22/2017 shows evolving multifocal right MCA  territory infarct with evolving focal hemorrhagic conversion right temporal lobe. Evolving nonhemorrhagic right PCA territory infarct. Aspirin for CVA prophylaxis currently on hold due to ICH.Patient is currently NPOwith Cortraktube for nutritional support. Completing [name] course of Unasyn for aspiration pneumonia.Physical and occupational therapy evaluations completed 06/21/2017 with recommendations of physical medicine rehabilitation consult.Patient was admitted for [name] comprehensive rehabilitation program 06/23/17.   NIH Total: 13  Past Medical History      Past Medical History:  Diagnosis Date  . COPD (chronic obstructive pulmonary disease) (HCC)   . Emphysema lung (HCC)   . Hx of long term use of blood thinners   . Hypertension   . Stroke [facility]) 2013  . Thyroid disease    hypothyroidism    Family History  family history is not on file.  Prior Rehab/Hospitalizations:  Has the patient had major surgery during 100 days prior to admission? No  Current Medications   Current Facility-Administered Medications:  .  0.9 %  sodium chloride infusion, 50 mL/hr, Intravenous, Continuous, Ronald Brock, Ronald [name], Last Rate: 50 mL/hr at 06/23/17 0800, 50 mL/hr at 06/23/17 0800 .  acetaminophen (TYLENOL) tablet 650 mg, 650 mg, Oral, Q4H PRN  **OR** acetaminophen (TYLENOL) solution 650 mg, 650 mg, Per Tube, Q4H PRN **OR** acetaminophen (TYLENOL) suppository 650 mg, 650 mg, Rectal, Q4H PRN, Ronald Brock, Ronald [name], 650 mg at 06/20/17 0430 .  albuterol (PROVENTIL) (2.5 MG/3ML) 0.083% nebulizer solution 2.5 mg, 2.5 mg, Nebulization, Q3H PRN, Ronald [name], Ronald P, PA-C, 2.5 mg at 06/21/17 2016 .  Ampicillin-Sulbactam (UNASYN) 3 g in sodium chloride 0.9 % 100 mL IVPB, 3 g, Intravenous, Q6H, Ronald Brock, Ronald [name], Stopped at 06/23/17 [phone] .  aspirin chewable tablet 81 mg, 81 mg, Oral, Daily, Costello, Ronald A, NP .  atorvastatin (LIPITOR) tablet 40 mg, 40 mg, Oral, q1800, Ronald Jude, Ronald [name] .  chlorhexidine gluconate (MEDLINE KIT) (PERIDEX) 0.12 % solution 15 mL, 15 mL, Mouth Rinse, BID, Ronald Cal, NP, 15 mL at 06/23/17 0757 .  guaiFENesin (ROBITUSSIN) 100 MG/5ML solution 100 mg, 5 mL, Per Tube, Q4H, Ronald Jude, Ronald [name] .  labetalol (NORMODYNE,TRANDATE) injection 10 mg, 10 mg, Intravenous, Q2H PRN, Costello, Ronald A, NP, 10 mg at 06/22/17 1959 .  levothyroxine (SYNTHROID, LEVOTHROID) injection 62.5 mcg, 62.5 mcg, Intravenous, Daily, Ronald Cal, NP, 62.5 mcg at 06/23/17 0945 .  MEDLINE mouth rinse, 15 mL, Mouth Rinse, QID, Micki Riley, Ronald [name], 15 mL at 06/23/17 0515 .  metoprolol tartrate (LOPRESSOR) tablet 12.5 mg, 12.5 mg, Oral, BID, Costello, Ronald A, NP .  pantoprazole (PROTONIX) injection 40 mg, 40 mg, Intravenous, QHS, Ronald Brock, Ronald [name], 40 mg at 06/22/17 2221  Patients Current Diet: Check puncture sites for bleeding or hematomas. Bleeding precautions Fall precautions Diet NPO time specified  Precautions / Restrictions Precautions Precautions: Fall Precaution Comments: watch O2 saturations and HR Restrictions Weight Bearing Restrictions: No   Has the patient had 2 or more falls or [name] fall with injury in the past year?Yes  Prior Activity Level Community (5-7x/wk): Prior to admission patient was fully  independent at home.  He was outside daily when it was nice outside and enjoyed caring for his chickens and dogs.  His roommate Ronald [name] would drive when they went out.  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   Home Assistive Devices / Equipment  Prior Device Use: Indicate devices/aids used by the patient prior to current illness, exacerbation  or injury? None of the above  Prior Functional Level Prior Function Level of Independence: Independent Comments: history of CVA but per daughter was mobilizing well, most residual impairments were speech related. Daughter does note that patient does not read or write at baseline  Self Care: Did the patient need help bathing, dressing, using the toilet or eating? Independent  Indoor Mobility: Did the patient need assistance with walking from room to room (with or without device)? Independent  Stairs: Did the patient need assistance with internal or external stairs (with or without device)? Independent  Functional Cognition: Did the patient need help planning regular tasks such as shopping or remembering to take medications? Needed some help  Current Functional Level Cognition  Arousal/Alertness: Awake/alert Overall Cognitive Status: Difficult to assess Difficult to assess due to: Impaired communication Current Attention Level: Sustained Orientation Level: Oriented to person, Other (comment)(difficult to assess-speech unintelligable) Following Commands: Follows one step commands inconsistently, Follows one step commands with increased time Safety/Judgement: Decreased awareness of safety, Decreased awareness of deficits General Comments: Pt demonstrates up to 15 second delay in response time  Attention: Sustained Sustained Attention: Impaired Sustained Attention Impairment: Verbal basic Comments: to be evaluated more completely as communication improves    Extremity  Assessment (includes Sensation/Coordination)  Upper Extremity Assessment: LUE deficits/detail LUE Deficits / Details: Pt neglecting Lt UE, but with tactile imput, he is able to flex shoulder actively to ~110* in supine.  He demonstrates gross grasp and release.    LUE Sensation: decreased proprioception LUE Coordination: decreased fine motor, decreased gross motor  Lower Extremity Assessment: Defer to PT evaluation LLE Deficits / Details: decreased awareness of left side during task performance, multi modal cues for strength assessment and mobility. mild assymetry noted upon testing LLE Coordination: decreased fine motor, decreased gross motor    ADLs  Overall ADL's : Needs assistance/impaired Eating/Feeding: NPO Grooming: Brushing hair, Moderate assistance, Sitting Grooming Details (indicate cue type and reason): requires assist to comb left side.  he will place comb in Lt UE and attempt to lift it to head, but unable to complete task, then looses attention, and requires mod - max [name] to redirect  Upper Body Bathing: Maximal assistance, Sitting Lower Body Bathing: Sit to/from stand, Maximal assistance Upper Body Dressing : Maximal assistance, Sitting Lower Body Dressing: Total assistance, Sit to/from stand Toilet Transfer: Moderate assistance, Stand-pivot, BSC Toilet Transfer Details (indicate cue type and reason): Pt unable to locate [facility] on his left, and required max multimodal cues to turn that direction and to sit  Toileting- Clothing Manipulation and Hygiene: Total assistance, Sit to/from stand Functional mobility during ADLs: Moderate assistance    Mobility  Overal bed mobility: Needs Assistance Bed Mobility: Supine to Sit, Sit to Supine Supine to sit: Min assist Sit to supine: Min assist General bed mobility comments: requires increased time and assist to move LEs  off of and to EOB     Transfers  Overall transfer level: Needs assistance Equipment used: 2 person hand held  assist(with wrap around support) Transfers: Sit to/from Stand Sit to Stand: Mod assist Stand pivot transfers: Min assist, +2 safety/equipment General transfer comment: assist for balance.  Attempted to side step up side of bed, but pt took one half step and returned to sitting     Ambulation / Gait / Stairs / Wheelchair Mobility  Ambulation/Gait Ambulation/Gait assistance: Min assist, +2 safety/equipment Ambulation Distance (Feet): 6 Feet Assistive device: 2 person hand held assist Gait Pattern/deviations: Step-through pattern, Decreased stride length General  Gait Details: cues for position and safety with bil UE assist to step forward and back Gait velocity interpretation: Below normal speed for age/gender    Posture / Balance Dynamic Sitting Balance Sitting balance - Comments: able to maintain static sitting with min guard assist  Balance Overall balance assessment: Needs assistance Sitting-balance support: Feet supported Sitting balance-Leahy Scale: Fair Sitting balance - Comments: able to maintain static sitting with min guard assist  Postural control: Posterior lean Standing balance support: Single extremity supported Standing balance-Leahy Scale: Poor Standing balance comment: requires mod [name] for balance and UE support     Special needs/care consideration BiPAP/CPAP: No CPM: No Continuous Drip IV: No Dialysis: No        Life Vest: No Oxygen: None PTA, now on 6L nasal cannula  Special Bed: ICU bed at present  Trach Size: No Wound Vac (area): No      Skin: Dry, Ecchymosis to bilateral upper extremities                                 Bowel mgmt: None since admission, admitted 06/19/17 Bladder mgmt: Foley for acute urinary retention  Diabetic mgmt: No, HgbA1c 5.4 Of note: daughter reports that patient was actively smoking up until admission recommend team assess for potential Nicotine patch need, discussed with Jesusita Oka, PA.      Previous Home Environment Living  Arrangements: Non-relatives/Friends(Roommate-Ronald)  Lives With: Friend(s) Available Help at Discharge: Family, Available 24 hours/day(daughter Debbie) Type of Home: Mobile home Home Layout: One level Home Access: Stairs to enter Entrance Stairs-Rails: Left Entrance Stairs-Number of Steps: 3 Bathroom Shower/Tub: Engineer, manufacturing systems: Standard Bathroom Accessibility: No Home Care Services: No Additional Comments: Pt lived with male friend, who is elderly.   Pt will likely stay with daughter after discharge from rehab facility   Discharge Living Setting Plans for Discharge Living Setting: Lives with (comment)(Daughter Debbie's home) Type of Home at Discharge: Mobile home Discharge Home Layout: One level Discharge Home Access: Stairs to enter Entrance Stairs-Rails: Can reach both Entrance Stairs-Number of Steps: 4 Discharge Bathroom Shower/Tub: Tub/shower unit(walk-in shower available in Estate agent if needed) Discharge Bathroom Toilet: Standard Discharge Bathroom Accessibility: Yes How Accessible: Accessible via walker Does the patient have any problems obtaining your medications?: No  Social/Family/Support Systems Patient Roles: Other (Comment), Parent(Friend ) Contact Information: Eunice Blase daughter: [phone] Anticipated Caregiver: Daughter or hired assist if needed Anticipated Caregiver's Contact Information: see above  Ability/Limitations of Caregiver: Daughter works at [employer] in [place] and states that she will hire care while she works if needed Caregiver Availability: Intermittent(but aware of anticipated 24/7 ) Discharge Plan Discussed with Primary Caregiver: Yes Is Caregiver In Agreement with Plan?: Yes Does Caregiver/Family have Issues with Lodging/Transportation while Pt is in Rehab?: No  Goals/Additional Needs Patient/Family Goal for Rehab: PT/OT/SLP: Supervision-Min [name] Expected length of stay: 20-25 days  Cultural Considerations: None Dietary  Needs: NPO with Cortrak; history of diverticulitis and was supposed to stay away from things with seeds Equipment Needs: TBD has none at home currently  Special Service Needs: None Pt/Family Agrees to Admission and willing to participate: Yes Program Orientation Provided & Reviewed with Pt/Caregiver Including Roles  & Responsibilities: Yes  Barriers to Discharge: New oxygen, Nutrition means  Decrease burden of Care through IP rehab admission: No  Possible need for SNF placement upon discharge: Not anticipated   Patient Condition: This patient's condition remains as documented in the consult  dated 06/22/17, in which the Rehabilitation Physician determined and documented that the patient's condition is appropriate for intensive rehabilitative care in an inpatient rehabilitation facility. Will admit to inpatient rehab today.  Preadmission Screen Completed By:  Fae Pippin, 06/23/2017 12:02 PM ______________________________________________________________________   Discussed status with Dr. Riley Kill on 06/23/17 at 1210 and received telephone approval for admission today.  Admission Coordinator:  Fae Pippin, time 1210/Date 06/23/17         Cosigned by: Ranelle Oyster, Ronald [name] at 06/23/2017 12:16 PM  Revision History

## 2017-06-23 NOTE — Discharge Summary (Addendum)
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 COPD (chronic obstructive pulmonary disease) (HCC)   . Emphysema lung (HCC)   . Hx of long term use of blood thinners   . Hypertension   . Stroke [facility]) 2013  . Thyroid disease    hypothyroidism   Past Surgical History:  Procedure Laterality Date  . IR ANGIO EXTRACRAN SEL COM CAROTID INNOMINATE UNI R MOD SED  06/19/2017  . IR ANGIO VERTEBRAL SEL SUBCLAVIAN INNOMINATE UNI R MOD SED  06/19/2017  . RADIOLOGY WITH ANESTHESIA N/A 06/19/2017   Procedure: RADIOLOGY WITH ANESTHESIA;  Surgeon: Julieanne Cotton, MD;  Location: [facility] OR;  Service: Radiology;  Laterality: N/A;    Medications to be continued on Rehab . aspirin  81 mg Oral Daily  . atorvastatin  40 mg Oral q1800  . chlorhexidine gluconate (MEDLINE KIT)  15 mL Mouth Rinse BID  . guaiFENesin  5 mL Per Tube Q4H  . levothyroxine  62.5 mcg Intravenous Daily  . mouth rinse  15 mL Mouth Rinse QID  . pantoprazole (PROTONIX) IV  40 mg Intravenous QHS  . phosphorus  500 mg Per Tube TID    LABORATORY STUDIES CBC    Component Value Date/Time   WBC 7.9 06/23/2017 0514   RBC 3.47 (L) 06/23/2017 0514   HGB 12.3 (L) 06/23/2017 0514   HGB 15.5  07/18/2012 0429   HCT 34.3 (L) 06/23/2017 0514   HCT 43.4 07/18/2012 0429   PLT 140 (L) 06/23/2017 0514   PLT 201 07/18/2012 0429   MCV 98.8 06/23/2017 0514   MCV 96 07/18/2012 0429   MCH 35.4 (H) 06/23/2017 0514   MCHC 35.9 06/23/2017 0514   RDW 13.5 06/23/2017 0514   RDW 13.7 07/18/2012 0429   LYMPHSABS 0.8 06/23/2017 0514   LYMPHSABS 1.1 07/18/2012 0429   MONOABS 0.4 06/23/2017 0514   MONOABS 0.6 07/18/2012 0429   EOSABS 0.1 06/23/2017 0514   EOSABS 0.2 07/18/2012 0429   BASOSABS 0.0 06/23/2017 0514   BASOSABS 0.1 07/18/2012 0429   CMP    Component Value Date/Time   NA 140 06/23/2017 0514   NA 139 07/18/2012 0429   K 3.5 06/23/2017 0514   K 3.8 07/18/2012 0429   CL 108 06/23/2017 0514   CL 105 07/18/2012 0429   CO2 19 (L) 06/23/2017 0514   CO2 25 07/18/2012 0429   GLUCOSE 93 06/23/2017 0514   GLUCOSE 93 07/18/2012 0429   BUN 8 06/23/2017 0514   BUN 16 07/18/2012 0429   CREATININE 0.60 (L) 06/23/2017 0514   CREATININE 0.74 07/18/2012 0429   CALCIUM 8.8 (L) 06/23/2017 0514   CALCIUM 8.7 07/18/2012 0429   PROT 6.3 (L) 06/19/2017 1917   PROT 7.7 07/15/2012 1137   ALBUMIN 3.8 06/19/2017 1917   ALBUMIN 3.9 07/15/2012 1137   AST  30 06/19/2017 1917   AST 33 07/15/2012 1137   ALT 19 06/19/2017 1917   ALT 27 07/15/2012 1137   ALKPHOS 43 06/19/2017 1917   ALKPHOS 74 07/15/2012 1137   BILITOT 0.6 06/19/2017 1917   BILITOT 0.6 07/15/2012 1137   GFRNONAA >60 06/23/2017 0514   GFRNONAA >60 07/18/2012 0429   GFRAA >60 06/23/2017 0514   GFRAA >60 07/18/2012 0429   COAGS Lab Results  Component Value Date   INR 1.23 06/19/2017   INR 1.1 07/15/2012   Lipid Panel    Component Value Date/Time   CHOL 174 06/20/2017 0331   CHOL 196 07/16/2012 0313   TRIG 50 06/20/2017 0331   TRIG 111 07/16/2012 0313   HDL 61 06/20/2017 0331   HDL 43 07/16/2012 0313   CHOLHDL 2.9 06/20/2017 0331   VLDL 10 06/20/2017 0331   VLDL 22 07/16/2012 0313   LDLCALC 103 (H) 06/20/2017 0331    LDLCALC 131 (H) 07/16/2012 0313   HgbA1C  Lab Results  Component Value Date   HGBA1C 5.4 06/20/2017   Urinalysis    Component Value Date/Time   COLORURINE YELLOW 06/20/2017 0338   APPEARANCEUR CLEAR 06/20/2017 0338   APPEARANCEUR Clear 07/15/2012 1719   LABSPEC 1.020 06/20/2017 0338   LABSPEC 1.015 07/15/2012 1719   PHURINE 5.0 06/20/2017 0338   GLUCOSEU NEGATIVE 06/20/2017 0338   GLUCOSEU Negative 07/15/2012 1719   HGBUR SMALL (A) 06/20/2017 0338   BILIRUBINUR NEGATIVE 06/20/2017 0338   BILIRUBINUR Negative 07/15/2012 1719   KETONESUR NEGATIVE 06/20/2017 0338   PROTEINUR NEGATIVE 06/20/2017 0338   NITRITE NEGATIVE 06/20/2017 0338   LEUKOCYTESUR NEGATIVE 06/20/2017 0338   LEUKOCYTESUR Negative 07/15/2012 1719   Urine Drug Screen     Component Value Date/Time   LABOPIA NONE DETECTED 06/19/2017 0338   COCAINSCRNUR NONE DETECTED 06/19/2017 0338   LABBENZ NONE DETECTED 06/19/2017 0338   AMPHETMU NONE DETECTED 06/19/2017 0338   THCU NONE DETECTED 06/19/2017 0338   LABBARB NONE DETECTED 06/19/2017 0338    Alcohol Level    Component Value Date/Time   ETH 129 (H) 06/19/2017 1914   SIGNIFICANT DIAGNOSTIC STUDIES Ct Angio Head W Or Wo Contrast Ct Angio Neck W Or Wo Contrast Result Date: 06/19/2017  IMPRESSION: Diffuse intracranial atherosclerotic disease. Apparent occlusion of a right insular/anterior temporal M2 branch. Serial severe stenoses in the right anterior cerebral artery. Atherosclerotic irregularity in both A1 segments. Atherosclerotic irregularity of the basilar artery without flow limiting stenosis. Aortic atherosclerosis. Carotid bifurcation atherosclerosis without stenosis. 50% vertebral artery origin stenoses. 30-50% V4 stenoses. Electronically Signed   By: Paulina Fusi M.D.   On: 06/19/2017 20:01   Ct Cerebral Perfusion W Contrast Result Date: 06/19/2017 IMPRESSION: Acute changes in the right MCA territory with likely completed infarction affecting the right  inferior temporal lobe and right inferior parietal lobe with potentially salvageable brain in the insular region and along the margins of the likely completed right parietal infarction. Electronically Signed   By: Paulina Fusi M.D.   On: 06/19/2017 20:22   Portable Chest X-ray Result Date: 06/20/2017 IMPRESSION: 1. Tip of the endotracheal tube at the thoracic inlet. 2. Developing fluffy perihilar opacities, left greater than right, suspicious for pulmonary edema. Cardiomegaly is unchanged. Electronically Signed   By: Rubye Oaks M.D.   On: 06/20/2017 01:06   Portable Chest X-ray Result Date: 06/22/2017 IMPRESSION: Persistent changes in the left base. Increasing basilar atelectasis and effusion on the right.  Ct Head Code Stroke Wo Contrast Result Date: 06/19/2017  IMPRESSION: 1. No acute finding. Chronic small-vessel changes of the white matter. 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 2. ASPECTS is 10   Echocardiogram:                                               Study Conclusions - Left ventricle: The cavity size was normal. There was moderate concentric hypertrophy. Systolic function was normal. The estimated ejection fraction was in the range of 55% to 60%. Wall motion was normal; there were no regional wall motion abnormalities. - Aortic valve: Transvalvular velocity was within the normal range. There was no stenosis. There was moderate regurgitation. Regurgitation pressure half-time: 409 ms. - Mitral valve: Transvalvular velocity was within the normal range. There was no evidence for stenosis. There was mild regurgitation. - Left atrium: The atrium was severely dilated. - Right ventricle: The cavity size was normal. Wall thickness was normal. Systolic function was mildly reduced. - Right atrium: The atrium was severely dilated. - Atrial septum: No defect or patent foramen ovale was identified by color flow Doppler. - Tricuspid valve: There was moderate  regurgitation. - Pulmonary arteries: Systolic pressure was mildly to moderately increased. PA peak pressure: 49 mm Hg (S).  MRI HEAD WITHOUT CONTRAST 06/20/2017 18:15 IMPRESSION: 1. Large region of acute/early subacute infarction involving right MCA and PCA territories. Involvement of right MCA and PCA distributions is likely due to fetal PCA anatomy. 2. 3.2 cm, 4.6 cc acute hemorrhage within right lateral temporal lobe.  CT Head:  06/22/2017   1. Evolving multifocal acute RIGHT MCA territory infarct with evolving focal hemorrhagic conversion RIGHT temporal lobe. 2. Evolving nonhemorrhagic RIGHT PCA territory infarct. 3. Old LEFT frontal lobe/MCA territory infarct.             HISTORY OF PRESENT ILLNESS    &    HOSPITAL COURSE IMPRESSION: Ronald [name] is a 77 y.o. male with PMH of AFIB on Coumadin with subtherapeutic INR, Hx of CVA, HTN and Hypothyroidism who presents with acute left-sided weakness and questionable M2 occlusion with improvement following IV TPA. Due to patients persistent deficits an angiogram was done to assess if there was truly an LVO and he was taken for this which was negative.  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  Suspected Etiology: likely atrial fibrillation with some therapeutic INR. Resultant Symptoms: Left sided deficits Stroke Risk Factors: atrial fibrillation, hyperlipidemia, hypertension and smoking Other Stroke Risk Factors: Advanced age, Hx of CVA, Hypothyroidism  PROCEDURES:   Dr Corliss Skains   06/19/2017 S/P RT common carotid arteriogram RT CFA approach  Findings. 1.No acute occlusions ,AV shunting or aneurysms noted. 2.Focal areas of mod to mod severe narrowing noted of the RT ACA A2 seg and the Rt MCA trifurcation branches  PLAN  06/23/2017: Continue Statin, Start ASA 81 mg daily Repeat Head CT in one week, call Stroke MD to review and make decision regarding timing of re-starting anticoagulation. Discharge to  CIR Frequent neuro checks PT/OT/SLP Consult Case Management /MSW Ongoing aggressive stroke risk factor management Patient's family counseled to be compliant withhisantithrombotic medications Patient's family counseled on Lifestyle modifications including, Diet, Exercise, and Stress Follow up with [facility] in 6 weeks  HX OF STROKES: previous L MCA stroke Baseline Findings: Expressive Aphasia  INTRACRANIAL Atherosclerosis &Stenosis: No need for DAPT at this time. Continue ASA/statin  DYSPHAGIA:  Cortrak placed this AM, TF's in progress Aspiration Precautions in progress  MEDICAL ISSUES:  ACUTE RESPIRATORY FAILURE: Management per CCM team - Appreciate assistance Extubated 06/20/2017 Repeat CXR - 2/21, persistent atelectasis +Resp Cx - IV Unasyn in progress Aggressive pulmonary hygiene  Titrate O2 to keep sats 88-90% Mobilize  NT suction PRN   AFIB, CHRONIC: ASA 81 mg daily started today Repeat Head CT in one week, Call Stroke MD to review and make decision regarding timing to restart [facility] therapy, likely start Eliquis Metoprolol restarted today at 12.5 mg BID Admission INR subtherapeutic  Hyponatremia Hypokalemia Hypomagnesia Replacement completed AM Repeat labs - WNL  Thrombocytopenia Trend  AM Repeat labs - stable  Hypothryoidism Continue home dose synthroid Check TSH - WNL  HYPERTENSION: Stable SBP goal of <180. DBP goal of <105.  Long term BP goal normotensive. Home Meds:  Metoprolol restarted today at 12.5 mg BID  HYPERLIPIDEMIA: Labs(Brief)          Component Value Date/Time   CHOL 174 06/20/2017 0331   CHOL 196 07/16/2012 0313   TRIG 50 06/20/2017 0331   TRIG 111 07/16/2012 0313   HDL 61 06/20/2017 0331   HDL 43 07/16/2012 0313   CHOLHDL 2.9 06/20/2017 0331   VLDL 10 06/20/2017 0331   VLDL 22 07/16/2012 0313   LDLCALC 103 (H) 06/20/2017 0331   LDLCALC 131 (H) 07/16/2012 0313    Home Meds:   NONE LDL  goal < 70 Started on Lipitor to 20 mg daily Continue statin at discharge  R/O DIABETES: RecentLabs       Lab Results  Component Value Date   HGBA1C 5.4 06/20/2017    HgbA1c goal < 7.0  TOBACCO and ETOH ABUSE Current smoker Smoking cessation counseling provided Nicotine patch provided CIWA PRN  Other Active Problems: Active Problems:   Stroke (cerebrum) (HCC)  DISCHARGE EXAM Blood pressure (!) 128/58, pulse 96, temperature 98.3 F (36.8 C), temperature source Oral, resp. rate (!) 26, height 5\' 11"  (1.803 m), weight 72.5 kg (159 lb 13.3 oz), SpO2 93 %. General - Well nourished, well developed, in no apparent distress HEENT-  Normocephalic,  Cardiovascular - Regular rate and rhythm  Respiratory - continues to have upper airway gurgling, No SOB/wheezing reported Abdomen - soft and non-tender, BS normal Extremities- no edema or cyanosis Mental Status: Patient is awake, he follows some simple commands, He does not appear to be attending to the left side as well as the right.  Cranial Nerves: II: He responds to visual stimuli in the right visual field but not the left.  Pupils are equal, round, and reactive to light.   III,IV, VI: R gaze preference.  V: Facial sensation is diminished on the left VII: Facial movement is symmetric.  VIII: hearing is intact to voice X: Uvula elevates symmetrically XI: Shoulder shrug is symmetric. XII: tongue is midline without atrophy or fasciculations.  Motor: left hemiparesis 3/5 , able to lift the left arm and leg against gravity, but clearly is weaker on the left than the right. Sensory: Sensation is diminished on the left Cerebellar: No clear ataxia  Discharge Diet  Check puncture sites for bleeding or hematomas. Bleeding precautions Fall precautions Diet NPO time specified liquids  DISCHARGE PLAN  Disposition:  Transfer to [facility] for ongoing PT, OT and ST  aspirin 81 mg daily for secondary  stroke prevention.  Recommend ongoing risk factor control by Primary Care Physician at time of discharge from inpatient rehabilitation.  Follow-up Masoud,  Renda Rolls, MD in 2 weeks following discharge from rehab.  Follow-up with Dr. Delia Heady, Stroke Clinic in 6 weeks, office to schedule an appointment.   Greater than 40 minutes were spent preparing discharge.  Beryl Meager, ANP-C Neurology Stroke Team 06/23/2017 11:20 AM I have personally examined this patient, reviewed notes, independently viewed imaging studies, participated in medical decision making and plan of care.ROS completed by me personally and pertinent positives fully documented  I have made any additions or clarifications directly to the above note. Agree with note above.  Delia Heady, MD Medical Director [facility] Pager: [phone] 06/23/2017 5:15 PM

## 2017-06-23 NOTE — Progress Notes (Signed)
PULMONARY / CRITICAL CARE MEDICINE   Name: Ronald [name] MRN: [id number] DOB: [birth year]    ADMISSION DATE:  06/19/2017 CONSULTATION DATE:  2/19  REFERRING MD:  Dr. Amada Jupiter  CHIEF COMPLAINT:  CVA  HISTORY OF PRESENT ILLNESS:  77 year old male heavy smoker with hx COPD, HTN, Atrial fibrillation, and CVA. He was in his usual state of health until 5:30 PM on 2/18 when he began to not feel well during dinner. He attempted to ambulate to the bathroom but fell. EMS was called and noted him to be weak no the left side with a R sided gaze preference. CT of the head demonstrated no acute findings. He was given systemic tpa and admitted to the ICU. CTA did not demonstrate any clear benefit from IR intervention, however, his defects persisted and it was decided to proceed with angiogram. No intervention was performed during angiogram. Patient remained on ventilator post op and was transferred to ICU.    SUBJECTIVE:   Extubated 2/19   He continues to have garbled speech which even family says is incomprehensible. He does follow simple instructions intermittently and slowly.Marland Kitchen  He continues to have upper airway gurgling but is not having increased work of breathing.  He is off the Cardene infusion.  VITAL SIGNS: BP (!) 128/58   Pulse 96   Temp 98.3 F (36.8 C) (Oral)   Resp (!) 26   Ht 5\' 11"  (1.803 m)   Wt 159 lb 13.3 oz (72.5 kg)   SpO2 93%   BMI 22.29 kg/m   HEMODYNAMICS:      INTAKE / OUTPUT: I/O last 3 completed shifts: In: 5008.5 [I.V.:3798.5; Other:10; IV Piggyback:1200] Out: 3425 [Urine:3425]  PHYSICAL EXAMINATION: General:  Chronically ill appearing barrel chested male, sitting up in bed and in no overt distress.    Neuro: He has spontaneous eye opening and is tracking for me today.  He attempt speech but it is unintelligible.  He is slow to follow simple instructions.  Pupils are equal, he does move all fours right upper extremity more vigorously than left.     Cardiovascular:  IRIR, No MRG Lungs: Transmitted upper airway noises less prominent than they had been., good air movement throughout, no wheezes  Abdomen:  Soft, non-distended Musculoskeletal:  No acute deformity Skin:  Grossly intact  LABS:  BMET Recent Labs  Lab 06/21/17 1235 06/22/17 0355 06/23/17 0514  NA 137 137 140  K 4.0 3.8 3.5  CL 105 104 108  CO2 24 23 19*  BUN 7 7 8   CREATININE 0.64 0.67 0.60*  GLUCOSE 112* 113* 93    Electrolytes Recent Labs  Lab 06/21/17 1235 06/22/17 0355 06/23/17 0514  CALCIUM 8.7* 8.7* 8.8*  MG 2.0 2.0 2.0  PHOS 2.2* 2.6 3.1    CBC Recent Labs  Lab 06/20/17 0331 06/21/17 0346 06/23/17 0514  WBC 8.4 8.9 7.9  HGB 13.2 13.5 12.3*  HCT 37.9* 39.1 34.3*  PLT 147* 135* 140*    Coag's Recent Labs  Lab 06/19/17 1917  APTT 32  INR 1.23    Sepsis Markers No results for input(s): LATICACIDVEN, PROCALCITON, O2SATVEN in the last 168 hours.  ABG Recent Labs  Lab 06/20/17 0124  PHART 7.297*  PCO2ART 46.7  PO2ART 82.0*    Liver Enzymes Recent Labs  Lab 06/19/17 1917  AST 30  ALT 19  ALKPHOS 43  BILITOT 0.6  ALBUMIN 3.8    Cardiac Enzymes No results for input(s): TROPONINI, PROBNP in the last 168  hours.  Glucose Recent Labs  Lab 06/19/17 1916 06/20/17 0020  GLUCAP 109* 86    Imaging No results found.   STUDIES:  CT head 2/18 > No acute finding. Chronic small-vessel changes of the white matter. 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 CTA head/neck 2/18 > Diffuse intracranial atherosclerotic disease. Apparent occlusion of a right insular/anterior temporal M2 branch. Serial severe stenoses in the right anterior cerebral artery. Atherosclerotic irregularity in both A1 segments. Atherosclerotic irregularity of the basilar artery without flow limiting stenosis.  Aortic atherosclerosis. Carotid bifurcation atherosclerosis without stenosis. 50% vertebral artery origin stenoses. 30-50% V4  stenoses.  CULTURES: None.  ANTIBIOTICS: Unasyn .  SIGNIFICANT EVENTS: 2/18 > admit  LINES/TUBES: ETT 2/18 >  DISCUSSION: 77 year old male with COPD and past CVA admitted again with CVA. Systemic tpa given in ED, however, symptoms did not improve as expected. He was taken to IR for arteriogram, no therapeutic intervention done. Post op her remained on vent in ICU, has been extubated but  had some gurgling and difficulties in controlling his upper airway.  He was empirically placed on Unasyn for possible aspiration.Marland Kitchen He developed some hemorrhage into the area of his right MCA infarct and for that reason is not anticoagulated either for prophylaxis or due to his atrial fibrillation.  ASSESSMENT / PLAN:  PULMONARY A: He appears to be controlling his airway secretions much better today.  My suspicion of significant aspiration at this point is limited and I would feel comfortable discontinuing his Unasyn in the morning if there is no unfavorable development.  COPD without acute exacerbation P:   Aggressive pulmonary hygiene  Titrate O2 to keep sats 88-90%  chest vest  BD's  Mobilize  NT suction PRN  See discussion below    CARDIOVASCULAR A:  Atrial fibrillation HTN P:  Telemetry monitoring Anticoagulation per neurology - holding for now  BP goal per neurology Holding metoprolol    GASTROINTESTINAL A:   Nutrition P: Repeat swallow study today.  I am anticipating he will need a feeding tube placed. NPO PPI   ENDOCRINE A:   Hypothryoid P: TSH today is normal  NEUROLOGIC A:   CVA s/p systemic TPA and arteriogram with no intervention.  He is on no antiplatelet or anticoagulant medications due to hemorrhagic evolution of his right MCA infarct.  Medically he is improving. P:   Per neurology I am aware of plans to transfer the patient out of the intensive care unit today, I concur with this decision, critical care medicine will not routinely follow please reconsult  [place] if needed  Penny Pia, MD 06/23/2017  9:16 AM Pager: [phone] or [phone]

## 2017-06-23 NOTE — Care Management Important Message (Signed)
Important Message  Patient Details  Name: Ronald [name] MRN: [id number] Date of Birth: 02-12-1941   Medicare Important Message Given:  Yes    Glennon Macmerson, Marshella Tello M, RN 06/23/2017, 3:22 PM

## 2017-06-23 NOTE — Progress Notes (Signed)
Physical Therapy Treatment Patient Details Name: Ronald [name] MRN: [id number] DOB: 1940/05/27 Today's Date: 06/23/2017    History of Present Illness 77 y.o. male with PMH of AFIB , Hx of CVA, HTN and Hypothyroidism who presents with acute left-sided weakness , CT head hemorrhagic right MCA and PCA infarct following IV TPA    PT Comments    Pt with increased attention to left today able to follow commands to turn head to left with continued right gaze preference. Pt able to increase activity tolerance and gait with SpO2 stable on 8L HFNC with sats 96%, HR 112-140 with mobility and toileting. Pt able to express need to void with BM. Continued impulsivity, decreased attention and awareness of deficits limit function but progressing well. Will continue to follow.     Follow Up Recommendations  CIR;Supervision/Assistance - 24 hour     Equipment Recommendations  Rolling walker with 5" wheels    Recommendations for Other Services       Precautions / Restrictions Precautions Precautions: Fall Precaution Comments: watch O2 saturations and HR    Mobility  Bed Mobility Overal bed mobility: Needs Assistance Bed Mobility: Supine to Sit     Supine to sit: Mod assist     General bed mobility comments: mod assist to initiate mobility and getting legs off of bed, pt then min assist to elevate trunk and complete transfer. Cues to attend to left side and bring left hip to EOB  Transfers Overall transfer level: Needs assistance   Transfers: Sit to/from Stand Sit to Stand: Min assist;+2 safety/equipment         General transfer comment: min assist to stand from bed and from toilet with max multimodal cues for safety and lines, assist to rise. Pt sitting prematurely at toilet and chair with assist to safely descend to surface  Ambulation/Gait Ambulation/Gait assistance: Min assist;+2 safety/equipment Ambulation Distance (Feet): 25 Feet Assistive device: 2 person hand held assist Gait  Pattern/deviations: Step-through pattern;Decreased stride length;Narrow base of support   Gait velocity interpretation: Below normal speed for age/gender General Gait Details: cues for position and safety with bil UE assist to direct gait and pt. Pt able to walk to door then motioning for bathroom, to bathroom then chair.    Stairs            Wheelchair Mobility    Modified Rankin (Stroke Patients Only) Modified Rankin (Stroke Patients Only) Pre-Morbid Rankin Score: Slight disability Modified Rankin: Moderately severe disability     Balance Overall balance assessment: Needs assistance   Sitting balance-Leahy Scale: Fair Sitting balance - Comments: static sitting at toilet and EOB with minguard     Standing balance-Leahy Scale: Poor Standing balance comment: mod assist for balance with bil UE support                            Cognition Arousal/Alertness: Awake/alert Behavior During Therapy: Flat affect Overall Cognitive Status: Difficult to assess Area of Impairment: Attention;Following commands;Safety/judgement;Problem solving                   Current Attention Level: Sustained   Following Commands: Follows one step commands inconsistently;Follows one step commands with increased time Safety/Judgement: Decreased awareness of safety;Decreased awareness of deficits   Problem Solving: Slow processing;Decreased initiation;Difficulty sequencing;Requires verbal cues;Requires tactile cues General Comments: delayed response, inattention to left with right gaze preference. unintelligible speech makes further assessment difficult      Exercises General Exercises -  Lower Extremity Ankle Circles/Pumps: AROM;Both;5 reps Long Arc Quad: AROM;Both;5 reps;Seated    General Comments        Pertinent Vitals/Pain Pain Assessment: (pt unable to state but no symptoms)    Home Living   Living Arrangements: Non-relatives/Friends(Roommate-Mary) Available  Help at Discharge: Family;Available 24 hours/day(daughter Debbie) Type of Home: Mobile home Home Access: Stairs to enter Entrance Stairs-Rails: Left Home Layout: One level   Additional Comments: Pt lived with male friend, who is elderly.   Pt will likely stay with daughter after discharge from rehab facility     Prior Function Level of Independence: Independent          PT Goals (current goals can now be found in the care plan section) Progress towards PT goals: Progressing toward goals    Frequency           PT Plan Current plan remains appropriate    Co-evaluation              AM-PAC PT "6 Clicks" Daily Activity  Outcome Measure  Difficulty turning over in bed (including adjusting bedclothes, sheets and blankets)?: A Lot Difficulty moving from lying on back to sitting on the side of the bed? : Unable Difficulty sitting down on and standing up from a chair with arms (e.g., wheelchair, bedside commode, etc,.)?: Unable Help needed moving to and from a bed to chair (including a wheelchair)?: A Lot Help needed walking in hospital room?: A Lot Help needed climbing 3-5 steps with a railing? : Total 6 Click Score: 9    End of Session Equipment Utilized During Treatment: Gait belt;Oxygen Activity Tolerance: Patient tolerated treatment well Patient left: in chair;with call bell/phone within reach;with chair alarm set;with family/visitor present Nurse Communication: Mobility status;Precautions PT Visit Diagnosis: Difficulty in walking, not elsewhere classified (R26.2);Other symptoms and signs involving the nervous system (R29.898);Other abnormalities of gait and mobility (R26.89)     Time: 1950-9326 PT Time Calculation (min) (ACUTE ONLY): 32 min  Charges:  $Gait Training: 8-22 mins $Therapeutic Activity: 8-22 mins                    G Codes:       Delaney Meigs, PT [phone]    Enedina Finner Shrika Milos 06/23/2017, 12:39 PM

## 2017-06-23 NOTE — H&P (Signed)
Physical Medicine and Rehabilitation Admission H&P       Chief Complaint  Patient presents with  . Code Stroke  : HPI: Ronald Butler Burnsis a 77 y.o.right handed malewith history of previous left MCA infarctionwith residual speech difficulty, atrial fibrillation maintained on Coumadin, hypertension, COPD/tobacco abuse, presented2/19/2019with left side weakness in right gaze preference. Per chart review patient lives with a male companionand reportedly mobile and independent. Plans to stay with daughter on discharge. Cranial CT scan negative for acute changes. 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 Patient did receive TPA. INR on admission of 1.23. CT angiogram head and neck showed apparent occlusion of right insular anterior temporal M2 branch. CT cerebral perfusion scan showed acute changes in the right MCA territory compatible with infarction affecting the right inferior temporal lobe and right inferior parietal lobe. Echocardiogram with ejection fraction of 60% no wall motion abnormalities. Carotid arteriogram completed 06/19/2017 showing no acute occlusions, AV shunting or aneurysm noted. Patient did require short-term intubation. MRI of the brain 06/20/2017 showed a 3.2 cm acute hemorrhage within the right lateral temporal lobe. CT of the head follow-up 06/22/2017 shows evolving multifocal right MCA territory infarct with evolving focal hemorrhagic conversion right temporal lobe. Evolving nonhemorrhagic right PCA territory infarct. Aspirin for CVA prophylaxis currently on hold due to [place].  Cortrak tube placed for nutritional support. Completing a course of Unasyn for aspiration pneumonia. Physical and occupational therapy evaluations completed 06/21/2017 with recommendations of physical medicine rehabilitation consult. Patient was admitted for a comprehensive rehabilitation program    Review of Systems  Unable to perform ROS: Language       Past Medical History:  Diagnosis Date   . COPD (chronic obstructive pulmonary disease) ([place])   . Emphysema lung ([place])   . Hx of long term use of blood thinners   . Hypertension   . Stroke [facility]) 2013  . Thyroid disease    hypothyroidism        Past Surgical History:  Procedure Laterality Date  . IR ANGIO EXTRACRAN SEL COM CAROTID INNOMINATE UNI R MOD SED  06/19/2017  . IR ANGIO VERTEBRAL SEL SUBCLAVIAN INNOMINATE UNI R MOD SED  06/19/2017  . RADIOLOGY WITH ANESTHESIA N/A 06/19/2017   Procedure: RADIOLOGY WITH ANESTHESIA;  Surgeon: Luanne Bras, MD;  Location: [facility];  Service: Radiology;  Laterality: N/A;   History reviewed. No pertinent family history. Social History:  reports that he has been smoking cigarettes.  He has been smoking about 1.00 pack per day. He does not have any smokeless tobacco history on file. He reports that he drinks alcohol. He reports that he does not use drugs. Allergies: No Known Allergies       Medications Prior to Admission  Medication Sig Dispense Refill  . Aspirin-Salicylamide-Caffeine (ARTHRITIS STRENGTH BC POWDER PO) Take 1 packet by mouth every hour.    . gabapentin (NEURONTIN) 100 MG capsule Take 100 mg by mouth 3 (three) times daily as needed (back pain/spasms).   2  . levothyroxine (SYNTHROID, LEVOTHROID) 125 MCG tablet Take 125 mcg by mouth daily after supper.   3  . metoprolol tartrate (LOPRESSOR) 50 MG tablet Take 50 mg by mouth daily after supper.   3  . warfarin (COUMADIN) 5 MG tablet Take 5 mg by mouth daily after supper.   2    Drug Regimen Review Drug regimen was reviewed and remains appropriate with no significant issues identified  Home: Home Living Family/patient expects to be discharged to:: Inpatient rehab Additional Comments: Pt  lived with male friend, who is elderly.   Pt will likely stay with daughter after discharge from rehab facility    Functional History: Prior Function Level of Independence: Independent Comments: history of CVA but  per daughter was mobilizing well, most residual impairments were speech related. Daughter does note that patient does not read or write at baseline  Functional Status:  Mobility: Bed Mobility Overal bed mobility: Needs Assistance Bed Mobility: Supine to Sit, Sit to Supine Supine to sit: Min assist Sit to supine: Min assist General bed mobility comments: requires increased time and assist to move LEs  off of and to EOB  Transfers Overall transfer level: Needs assistance Equipment used: 2 person hand held assist(with wrap around support) Transfers: Sit to/from Stand Sit to Stand: Mod assist Stand pivot transfers: Min assist, +2 safety/equipment General transfer comment: assist for balance.  Attempted to side step up side of bed, but pt took one half step and returned to sitting  Ambulation/Gait Ambulation/Gait assistance: Min assist, +2 safety/equipment Ambulation Distance (Feet): 6 Feet Assistive device: 2 person hand held assist Gait Pattern/deviations: Step-through pattern, Decreased stride length General Gait Details: cues for position and safety with bil UE assist to step forward and back Gait velocity interpretation: Below normal speed for age/gender  ADL: ADL Overall ADL's : Needs assistance/impaired Eating/Feeding: NPO Grooming: Brushing hair, Moderate assistance, Sitting Grooming Details (indicate cue type and reason): requires assist to comb left side.  he will place comb in Lt UE and attempt to lift it to head, but unable to complete task, then looses attention, and requires mod - max A to redirect  Upper Body Bathing: Maximal assistance, Sitting Lower Body Bathing: Sit to/from stand, Maximal assistance Upper Body Dressing : Maximal assistance, Sitting Lower Body Dressing: Total assistance, Sit to/from stand Toilet Transfer: Moderate assistance, Stand-pivot, BSC Toilet Transfer Details (indicate cue type and reason): Pt unable to locate [facility] on his left, and required max  multimodal cues to turn that direction and to sit  Toileting- Clothing Manipulation and Hygiene: Total assistance, Sit to/from stand Functional mobility during ADLs: Moderate assistance  Cognition: Cognition Overall Cognitive Status: Difficult to assess Arousal/Alertness: Awake/alert Orientation Level: Oriented to person Attention: Sustained Sustained Attention: Impaired Sustained Attention Impairment: Verbal basic Comments: to be evaluated more completely as communication improves Cognition Arousal/Alertness: Awake/alert Behavior During Therapy: Flat affect Overall Cognitive Status: Difficult to assess Area of Impairment: Attention, Following commands, Safety/judgement, Problem solving Current Attention Level: Sustained Following Commands: Follows one step commands inconsistently, Follows one step commands with increased time Safety/Judgement: Decreased awareness of safety, Decreased awareness of deficits Awareness: Intellectual Problem Solving: Slow processing, Decreased initiation, Difficulty sequencing, Requires verbal cues, Requires tactile cues General Comments: Pt demonstrates up to 15 second delay in response time  Difficult to assess due to: Impaired communication  Physical Exam: Blood pressure 127/62, pulse 93, temperature 97.6 F (36.4 C), temperature source Oral, resp. rate (!) 26, height '5\' 11"'  (1.803 m), weight 72.5 kg (159 lb 13.3 oz), SpO2 (!) 87 %. Physical Exam Vitalsreviewed. Gen: alert, no distress HENT:oral mucosa dry Cortrak tube in place Eyes: Pupils reactive to light. No discharge Neck:Normal range of motion.Neck supple.No thyromegalypresent.  Cardiovascular:IRR/IRR Cardiac rate controlled Respiratory: Limited inspiratory effort with upper airway congestion/rhonchi noted.  DU:KGUR.Bowel sounds are normal. He exhibitsno distension/nontender Skin. Warm and dry with a few scattered ecchymoses Neurological: Right gaze preference. Poor  oral-motor control. Attempts to protrude tongue forward but unable move past lips. Speech very dysarthric. Difficulty assessing language due  to dysarthria.  Restless. Moves all 4's. RUE and RLE 4/5. LUE and LLE grossly 3+ to 4/5 with inattention and motor planning issues. Senses pain R>L limbs. DTR's 1+ Psych: restless, occasionally agitated    LabResultsLast48Hours  Results for orders placed or performed during the hospital encounter of 06/19/17 (from the past 48 hour(s))  Basic metabolic panel     Status: Abnormal   Collection Time: 06/21/17 12:35 PM  Result Value Ref Range   Sodium 137 135 - 145 mmol/L   Potassium 4.0 3.5 - 5.1 mmol/L   Chloride 105 101 - 111 mmol/L   CO2 24 22 - 32 mmol/L   Glucose, Bld 112 (H) 65 - 99 mg/dL   BUN 7 6 - 20 mg/dL   Creatinine, Ser 0.64 0.61 - 1.24 mg/dL   Calcium 8.7 (L) 8.9 - 10.3 mg/dL   GFR calc non Af Amer >60 >60 mL/min   GFR calc Af Amer >60 >60 mL/min    Comment: (NOTE) The eGFR has been calculated using the CKD EPI equation. This calculation has not been validated in all clinical situations. eGFR's persistently <60 mL/min signify possible Chronic Kidney Disease.    Anion gap 8 5 - 15    Comment: Performed at [facility] [address] [place], [place] [address]  Magnesium     Status: None   Collection Time: 06/21/17 12:35 PM  Result Value Ref Range   Magnesium 2.0 1.7 - 2.4 mg/dL    Comment: Performed at [facility], [place] [address] [place], [place] [address]  Phosphorus     Status: Abnormal   Collection Time: 06/21/17 12:35 PM  Result Value Ref Range   Phosphorus 2.2 (L) 2.5 - 4.6 mg/dL    Comment: Performed at [facility] [address] [place], [place] [address]  Basic metabolic panel     Status: Abnormal   Collection Time: 06/22/17  3:55 AM  Result Value Ref Range   Sodium 137 135 - 145 mmol/L   Potassium 3.8 3.5 - 5.1 mmol/L   Chloride 104 101 - 111 mmol/L   CO2  23 22 - 32 mmol/L   Glucose, Bld 113 (H) 65 - 99 mg/dL   BUN 7 6 - 20 mg/dL   Creatinine, Ser 0.67 0.61 - 1.24 mg/dL   Calcium 8.7 (L) 8.9 - 10.3 mg/dL   GFR calc non Af Amer >60 >60 mL/min   GFR calc Af Amer >60 >60 mL/min    Comment: (NOTE) The eGFR has been calculated using the CKD EPI equation. This calculation has not been validated in all clinical situations. eGFR's persistently <60 mL/min signify possible Chronic Kidney Disease.    Anion gap 10 5 - 15    Comment: Performed at [facility] [address] [place], [place] [address]  Magnesium     Status: None   Collection Time: 06/22/17  3:55 AM  Result Value Ref Range   Magnesium 2.0 1.7 - 2.4 mg/dL    Comment: Performed at [facility] [address] [place], [place] [address]  Phosphorus     Status: None   Collection Time: 06/22/17  3:55 AM  Result Value Ref Range   Phosphorus 2.6 2.5 - 4.6 mg/dL    Comment: Performed at [facility] [address] [place], [place] [address]      ImagingResults(Last48hours)  Ct Head Wo Contrast  Result Date: 06/22/2017 CLINICAL DATA:  Follow-up stroke. EXAM:  CT HEAD WITHOUT CONTRAST TECHNIQUE: Contiguous axial images were obtained from the base of the skull through the vertex without intravenous contrast. COMPARISON:  CT HEAD June 19, 2017 and MRI of the head June 20, 2016 FINDINGS: BRAIN: Evolving 3 x 1.9 cm RIGHT temporal acute hematoma with surrounding vasogenic and cytotoxic edema. Wedge-like cytotoxic edema RIGHT occipital lobe extending to RIGHT parietal lobe and RIGHT periatrial white matter. Focal cyst cytotoxic edema to lesser extent RIGHT frontal lobe including insula, RIGHT basal ganglia. Old LEFT frontal lobe infarct. No midline shift. No parenchymal brain volume loss for age. No hydrocephalus. Patchy white matter changes exclusive a aforementioned abnormality compatible with moderate chronic small vessel ischemic disease. No  abnormal extra-axial fluid collections. Basal cisterns are patent. VASCULAR: Mild calcific atherosclerosis of the carotid siphons. SKULL: No skull fracture. No significant scalp soft tissue swelling. SINUSES/ORBITS: Mild paranasal sinus mucosal thickening with small RIGHT maxillary sinus air-fluid level. Mastoid air cells are well aerated.The included ocular globes and orbital contents are non-suspicious. OTHER: Patient is edentulous. IMPRESSION: 1. Evolving multifocal acute RIGHT MCA territory infarct with evolving focal hemorrhagic conversion RIGHT temporal lobe. 2. Evolving nonhemorrhagic RIGHT PCA territory infarct. 3. Old LEFT frontal lobe/MCA territory infarct. Electronically Signed   By: Elon Alas M.D.   On: 06/22/2017 04:29   Dg Chest Portable 1 View  Result Date: 06/22/2017 CLINICAL DATA:  Respiratory failure EXAM: PORTABLE CHEST 1 VIEW COMPARISON:  06/20/2017 FINDINGS: Cardiac shadow remains enlarged. Postsurgical changes are again noted. The endotracheal tube has been removed. Persistent changes are noted in the left mid and lower lung. Increasing right basilar atelectasis and effusion is seen. No bony abnormality is noted. IMPRESSION: Persistent changes in the left base. Increasing basilar atelectasis and effusion on the right. Electronically Signed   By: Inez Catalina M.D.   On: 06/22/2017 07:37        Medical Problem List and Plan: 1.  Left side weakness secondary to right temporoparietal infarct status post TPA with hemorrhagic conversion as well as history of left MCA infarction with residual aphasia             -admit to inpatient rehab 2.  DVT Prophylaxis/Anticoagulation: SCD's only given hemorrhage. Monitor for signs of DVT             -check dopplers 3. Pain Management: Tylenol as needed 4. Mood: Provide emotional support 5. Neuropsych: This patient is capable of making decisions on his own behalf. 6. Skin/Wound Care: Routine skin checks 7.  Fluids/Electrolytes/Nutrition: Routine I&O with follow up chemistries 8. Dysphagia.NPO. Cortrak for nutritional support. Follow-up speech therapy 9. Aspiration pneumonia. Completing course of Unasyn             -continue aspiration precautions 10. Hypothyroidism. Synthroid 11.Hyperlipidemia. Lipitor 12. Atrial fibrillation. Cardiac rate controlled. Chronic Coumadin on hold d/t hemorrhage. Await plan to begin aspirin therapy per neurology services 13. Tobacco abuse. Counseling  Post Admission Physician Evaluation: 1. Functional deficits secondary  to right temporal-parietal infarct with hemorrhagic conversion. 2. Patient is admitted to receive collaborative, interdisciplinary care between the physiatrist, rehab nursing staff, and therapy team. 3. Patient's level of medical complexity and substantial therapy needs in context of that medical necessity cannot be provided at a lesser intensity of care such as a SNF. 4. Patient has experienced substantial functional loss from his/her baseline which was documented above under the "Functional History" and "Functional Status" headings.  Judging by the patient's diagnosis, physical exam, and functional history, the patient has potential for functional  progress which will result in measurable gains while on inpatient rehab.  These gains will be of substantial and practical use upon discharge  in facilitating mobility and self-care at the household level. 5. Physiatrist will provide 24 hour management of medical needs as well as oversight of the therapy plan/treatment and provide guidance as appropriate regarding the interaction of the two. 6. The Preadmission Screening has been reviewed and patient status is unchanged unless otherwise stated above. 7. 24 hour rehab nursing will assist with bladder management, bowel management, safety, skin/wound care, disease management, medication administration, pain management and patient education  and help integrate  therapy concepts, techniques,education, etc. 8. PT will assess and treat for/with: Lower extremity strength, range of motion, stamina, balance, functional mobility, safety, adaptive techniques and equipment, NMR, visual-spatial awareness, family education.   Goals are: supervision to min assist. 9. OT will assess and treat for/with: ADL's, functional mobility, safety, upper extremity strength, adaptive techniques and equipment, NMR, visual-spatial awareness, family education.   Goals are: supervision to min assist. Therapy may proceed with showering this patient. 10. SLP will assess and treat for/with: cognition, language, speech, communication, swallowing and family ed.  Goals are: supervision to min assist. 11. Case Management and Social Worker will assess and treat for psychological issues and discharge planning. 12. Team conference will be held weekly to assess progress toward goals and to determine barriers to discharge. 13. Patient will receive at least 3 hours of therapy per day at least 5 days per week. 14. ELOS: 20-25 days       15. Prognosis:  excellent     Meredith Staggers, MD, [facility] 06/23/2017   Lavon Paganini Hornitos, PA-C 06/23/2017

## 2017-06-23 NOTE — H&P (Deleted)
  The note originally documented on this encounter has been moved the the encounter in which it belongs.  

## 2017-06-23 NOTE — Progress Notes (Signed)
Cortrak Tube Team Note:  Consult received to place a Cortrak feeding tube.   A 10 F Cortrak tube was placed in the left nare and secured with a nasal bridle at 90 cm. Per the Cortrak monitor reading the tube tip is post pyloric.   No x-ray is required. RN may begin using tube.   If the tube becomes dislodged please keep the tube and contact the Cortrak team at [email or web address] (password [email or web address]) for replacement.  If after hours and replacement cannot be delayed, place a NG tube and confirm placement with an abdominal x-ray.    Vanessa Kickarly Shani Fitch RD, LDN Clinical Nutrition Pager # [phone]

## 2017-06-23 NOTE — Progress Notes (Signed)
Physical Medicine and Rehabilitation Consult Reason for Consult: Left-sided weakness Referring Physician: Dr. Pearlean BrownieSethi   HPI: Ronald [name] is a 77 y.o. 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 Per chart review patient lives with a male companion and reportedly mobile and independent. Plans to stay with daughter on discharge. Cranial CT scan negative for acute changes. 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 Patient did  receive TPA. INR on admission of 1.23. CT angiogram head and neck showed apparent occlusion of right insular anterior temporal M2 branch. CT cerebral perfusion scan showed acute changes in the right MCA territory compatible with infarction affecting the right inferior temporal lobe and right inferior parietal lobe. Echocardiogram with ejection fraction of 60% no wall motion abnormalities. Carotid arteriogram completed 06/19/2017 showing no acute occlusions, AV shunting or aneurysm noted. Patient did require short-term intubation. Follow-up MRI shows hemorrhagic transformation antiplatelet therapy held with follow-up per neurology services. Physical and occupational therapy evaluations completed 06/21/2017 with recommendations of physical medicine rehabilitation consult.   Review of Systems  Unable to perform ROS: Language       Past Medical History:  Diagnosis Date  . COPD (chronic obstructive pulmonary disease) (HCC)   . Emphysema lung (HCC)   . Hx of long term use of blood thinners   . Hypertension   . Stroke [facility](HCC) 2013  . Thyroid disease    hypothyroidism        Past Surgical History:  Procedure Laterality Date  . IR ANGIO EXTRACRAN SEL COM CAROTID INNOMINATE UNI R MOD SED  06/19/2017  . IR ANGIO VERTEBRAL SEL SUBCLAVIAN INNOMINATE UNI R MOD SED  06/19/2017  . RADIOLOGY WITH  ANESTHESIA N/A 06/19/2017   Procedure: RADIOLOGY WITH ANESTHESIA;  Surgeon: Julieanne Cottoneveshwar, Sanjeev, MD;  Location: [facility] OR;  Service: Radiology;  Laterality: N/A;   History reviewed. No pertinent family history. Social History:  reports that he has been smoking cigarettes.  He has been smoking about 1.00 pack per day. He does not have any smokeless tobacco history on file. He reports that he drinks alcohol. He reports that he does not use drugs. Allergies: No Known Allergies       Medications Prior to Admission  Medication Sig Dispense Refill  . Aspirin-Salicylamide-Caffeine (ARTHRITIS STRENGTH BC POWDER PO) Take 1 packet by mouth every hour.    . gabapentin (NEURONTIN) 100 MG capsule Take 100 mg by mouth 3 (three) times daily as needed (back pain/spasms).   2  . levothyroxine (SYNTHROID, LEVOTHROID) 125 MCG tablet Take 125 mcg by mouth daily after supper.   3  . metoprolol tartrate (LOPRESSOR) 50 MG tablet Take 50 mg by mouth daily after supper.   3  . warfarin (COUMADIN) 5 MG tablet Take 5 mg by mouth daily after supper.   2    Home: Home Living Family/patient expects to be discharged to:: Inpatient rehab Additional Comments: Pt lived with male friend, who is elderly.   Pt will likely stay with daughter after discharge from rehab facility   Functional History: Prior Function Level of Independence: Independent Comments: history of CVA but per daughter was mobilizing well, most residual impairments were speech related. Daughter does note that patient does not read or write at baseline Functional Status:  Mobility: Bed Mobility Overal bed mobility: Needs Assistance Bed Mobility: Supine to Sit Supine to sit: Min assist General bed mobility comments: assist  to initiate LEs movement to EOB, in addition to physical assist to elevate trunk to upright (patient to long sitting position but multi modal cues and assist (min assist of 2 person) to rotate trunk and position at  EOB Transfers Overall transfer level: Needs assistance Equipment used: 2 person hand held assist(with wrap around support) Transfers: Sit to/from Stand, Stand Pivot Transfers Sit to Stand: Min assist, +2 physical assistance Stand pivot transfers: Max assist, +2 physical assistance General transfer comment: +2 min assist to power up to standing from bed, +2 max assist to pivot to left.  Due to neglect, pt with significant difficulty attending to items on his left especially when fatigued.  When turning toward [facility] on his left, he repeatedly attempted to turn Rt or go straight, not recognizing/seeing BSC.  Required physical assist to pivot, back up and to sit Ambulation/Gait Ambulation/Gait assistance: Max assist, +2 physical assistance Ambulation Distance (Feet): 6 Feet Assistive device: 2 person hand held assist(with wrap around support) Gait Pattern/deviations: Step-to pattern General Gait Details: patient with no spatiall or functional awareness of left side during mobility. Manual facilitation of LLE movement with 2 person physical assist to maintain upright  ADL: ADL Overall ADL's : Needs assistance/impaired Eating/Feeding: NPO Grooming: Wash/dry hands, Wash/dry face, Brushing hair, Minimal assistance, Sitting Upper Body Bathing: Maximal assistance, Sitting Lower Body Bathing: Sit to/from stand, Maximal assistance Upper Body Dressing : Maximal assistance, Sitting Lower Body Dressing: Total assistance, Sit to/from stand Toilet Transfer: +2 for physical assistance, BSC, Maximal assistance Toilet Transfer Details (indicate cue type and reason): Pt unable to locate [facility] on his left, and required max multimodal cues to turn that direction and to sit  Toileting- Clothing Manipulation and Hygiene: Total assistance, Sit to/from stand Functional mobility during ADLs: Moderate assistance, +2 for physical assistance  Cognition: Cognition Overall Cognitive Status: Impaired/Different from  baseline Arousal/Alertness: Awake/alert Orientation Level: Oriented to person Attention: Sustained Sustained Attention: Impaired Sustained Attention Impairment: Verbal basic Comments: to be evaluated more completely as communication improves Cognition Arousal/Alertness: Awake/alert Behavior During Therapy: Restless Overall Cognitive Status: Impaired/Different from baseline Area of Impairment: Attention, Memory, Following commands, Safety/judgement, Awareness, Problem solving Current Attention Level: Sustained, Focused(fleeting with fatigue) Following Commands: Follows one step commands consistently, Follows one step commands with increased time Safety/Judgement: Decreased awareness of safety, Decreased awareness of deficits Awareness: Intellectual Problem Solving: Slow processing, Difficulty sequencing, Requires verbal cues, Requires tactile cues General Comments: Pt slow to respond and to initiate activity.  He follows one step cues with cuing, but as he fatigued and WOB increased, he was unable to sustain his attention, and required max multimodal cues to respond   Blood pressure 138/68, pulse 98, temperature 99.5 F (37.5 C), temperature source Oral, resp. rate 20, height 5\' 11"  (1.803 m), weight 72.5 kg (159 lb 13.3 oz), SpO2 96 %. Physical Exam  Vitals reviewed. HENT:  Left facial droop  Eyes:  Pupils reactive to light  Neck: Normal range of motion. Neck supple. No thyromegaly present.  Cardiovascular:  Cardiac rate controlled  Respiratory:  Limited inspiratory effort with upper airway congestion  GI: Soft. Bowel sounds are normal. He exhibits no distension.  Neurological:  Appears to have a right gaze preference. He would give thumbs up on verbal commands. His speech is very dysarthric. Restless.. Moves all 4's but noticeable left sided weakness (inconsistent effort/attention)--perhaps 1-2 LUE and trace LLE.. Significant dysarthria, poor oro-motor control. Senses pain in all  4's.   Skin: Skin is warm and dry.  Assessment/Plan:  Diagnosis: Right temporal-parietal infarct 1. Does the need for close, 24 hr/day medical supervision in concert with the patient's rehab needs make it unreasonable for this patient to be served in a less intensive setting? Yes 2. Co-Morbidities requiring supervision/potential complications: pain, aspiration precautions 3. Due to bladder management, bowel management, safety, skin/wound care, disease management, medication administration, pain management and patient education, does the patient require 24 hr/day rehab nursing? Yes 4. Does the patient require coordinated care of a physician, rehab nurse, PT (1-2 hrs/day, 5 days/week), OT (1-2 hrs/day, 5 days/week) and SLP (1-2 hrs/day, 5 days/week) to address physical and functional deficits in the context of the above medical diagnosis(es)? Yes Addressing deficits in the following areas: balance, endurance, locomotion, strength, transferring, bowel/bladder control, bathing, dressing, feeding, grooming, toileting, cognition, speech, swallowing and psychosocial support 5. Can the patient actively participate in an intensive therapy program of at least 3 hrs of therapy per day at least 5 days per week? Yes 6. The potential for patient to make measurable gains while on inpatient rehab is excellent 7. Anticipated functional outcomes upon discharge from inpatient rehab are supervision and min assist  with PT, supervision and min assist with OT, supervision and min assist with SLP. 8. Estimated rehab length of stay to reach the above functional goals is: potentially 20-25 days 9. Anticipated D/C setting: Home 10. Anticipated post D/C treatments: HH therapy and Outpatient therapy 11. Overall Rehab/Functional Prognosis: excellent  RECOMMENDATIONS: This patient's condition is appropriate for continued rehabilitative care in the following setting: CIR Patient has agreed to participate in recommended program.  N/A Note that insurance prior authorization may be required for reimbursement for recommended care.  Comment: Rehab Admissions Coordinator to follow up.  Thanks,  Ranelle Oyster, MD, [place]    Ronald Rossetti Angiulli, PA-C 06/22/2017          Revision History                        Routing History

## 2017-06-24 ENCOUNTER — Inpatient Hospital Stay (HOSPITAL_COMMUNITY): Payer: Self-pay | Admitting: Occupational Therapy

## 2017-06-24 ENCOUNTER — Inpatient Hospital Stay (HOSPITAL_COMMUNITY): Payer: Self-pay | Admitting: Physical Therapy

## 2017-06-24 ENCOUNTER — Inpatient Hospital Stay (HOSPITAL_COMMUNITY): Payer: Self-pay

## 2017-06-24 DIAGNOSIS — I6389 Other cerebral infarction: Secondary | ICD-10-CM

## 2017-06-24 MED ORDER — FREE WATER
150.0000 mL | Freq: Three times a day (TID) | Status: DC
  Administered 2017-06-24 – 2017-07-03 (×28): 150 mL

## 2017-06-24 MED ORDER — PRO-STAT SUGAR FREE PO LIQD
30.0000 mL | Freq: Every day | ORAL | Status: DC
  Administered 2017-06-24 – 2017-07-03 (×10): 30 mL
  Filled 2017-06-24 (×9): qty 30

## 2017-06-24 MED ORDER — JEVITY 1.2 CAL PO LIQD
1000.0000 mL | ORAL | Status: DC
  Administered 2017-06-24 – 2017-06-26 (×2): 1000 mL
  Administered 2017-06-26 (×2): 80 mL/h
  Administered 2017-06-27 – 2017-07-03 (×8): 1000 mL
  Filled 2017-06-24 (×2): qty 1000
  Filled 2017-06-24 (×2): qty 237
  Filled 2017-06-24: qty 1000
  Filled 2017-06-24: qty 237
  Filled 2017-06-24 (×4): qty 1000
  Filled 2017-06-24: qty 237
  Filled 2017-06-24 (×6): qty 1000

## 2017-06-24 MED ORDER — CHLORHEXIDINE GLUCONATE 0.12 % MT SOLN
15.0000 mL | Freq: Four times a day (QID) | OROMUCOSAL | Status: DC
  Administered 2017-06-24 – 2017-07-01 (×29): 15 mL via OROMUCOSAL
  Filled 2017-06-24 (×26): qty 15

## 2017-06-24 NOTE — Evaluation (Signed)
Physical Therapy Assessment and Plan  Patient Details  Name: Ronald [name] MRN: [id number] Date of Birth: 1941-01-11  PT Diagnosis: Abnormality of gait, Coordination disorder, Hemiparesis non-dominant and Muscle weakness Rehab Potential: Fair ELOS: 18-21 days   Today's Date: 06/24/2017 PT Individual Time: 1445-1535 PT Individual Time Calculation (min): 50 min    Problem List:  Patient Active Problem List   Diagnosis Date Noted  . 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 Dysphagia, oropharyngeal 06/23/2017  . Respiratory failure (HCC)   . Palliative care encounter   . Aspiration pneumonia due to gastric secretions (HCC)   . Atrial fibrillation (HCC)   . 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 COPD (chronic obstructive pulmonary disease) (HCC)   . Emphysema lung (HCC)   . Hx of long term use of blood thinners   . Hypertension   . Stroke [facility]) 2013  . Thyroid disease    hypothyroidism   Past Surgical History:  Past Surgical History:  Procedure Laterality Date  . IR ANGIO EXTRACRAN SEL COM CAROTID INNOMINATE UNI R MOD SED  06/19/2017  . IR ANGIO VERTEBRAL SEL SUBCLAVIAN INNOMINATE UNI R MOD SED  06/19/2017  . RADIOLOGY WITH ANESTHESIA N/A 06/19/2017   Procedure: RADIOLOGY WITH ANESTHESIA;  Surgeon: Julieanne Cotton, MD;  Location: [facility] OR;  Service: Radiology;  Laterality: N/A;    Assessment & Plan Clinical Impression: Ronald [name] is a 77 y.o. 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 Per chart review patient lives with a male companion, [name] and reportedly mobile and independent. Plans to stay with daughter on discharge if needed. Cranial CT scan negative for acute changes. 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  Patient did receive TPA. INR on admission of 1.23. CT angiogram head and neck showed apparent occlusion of right insular anterior temporal M2 branch. CT cerebral perfusion scan showed acute changes in the right MCA territory compatible with infarction affecting the right inferior temporal lobe and right inferior parietal lobe. Echocardiogram with ejection fraction of 60% no wall motion abnormalities. Carotid arteriogram completed 06/19/2017 showing no acute occlusions, AV shunting, or aneurysm noted. Patient did require short-term intubation. MRI of the brain 06/20/2017 showed a 3.2 cm acute hemorrhage within the right lateral temporal lobe. CT of the head follow-up 06/22/2017 shows evolving multifocal right MCA territory infarct with evolving focal hemorrhagic conversion right temporal lobe. Evolving nonhemorrhagic right PCA territory infarct. Aspirin for CVA prophylaxis currently on hold due to ICH. Patient is currently NPO with Cortrak tube for nutritional support. Completing a course of Unasyn for aspiration pneumonia. Physical and occupational therapy evaluations completed 06/21/2017 with recommendations of physical medicine rehabilitation consult. Patient was admitted for a comprehensive rehabilitation program 06/23/17.   Patient transferred to CIR on 06/23/2017 .   Patient currently requires min/mod with mobility secondary to muscle weakness, decreased coordination, decreased visual perceptual skills and ?field cut, decreased midline orientation and decreased attention to left, decreased attention, decreased awareness, decreased problem solving, decreased safety awareness, decreased memory and delayed processing and decreased sitting balance, decreased standing balance, decreased postural control, hemiplegia and decreased balance strategies.  Prior to hospitalization, patient was modified independent  with mobility and lived with Friend(s) in a Mobile home home.  Home access is 3Stairs to enter.  Patient  will benefit from skilled PT  intervention to maximize safe functional mobility, minimize fall risk and decrease caregiver burden for planned discharge home with 24 hour supervision.  Anticipate patient will benefit from follow up HH at discharge.  PT - End of Session Activity Tolerance: Tolerates 10 - 20 min activity with multiple rests Endurance Deficit: Yes PT Assessment Rehab Potential (ACUTE/IP ONLY): Fair PT Barriers to Discharge: Medical stability PT Patient demonstrates impairments in the following area(s): Balance;Endurance;Motor;Skin Integrity;Safety;Perception PT Transfers Functional Problem(s): Bed Mobility;Bed to Chair;Car;Furniture PT Locomotion Functional Problem(s): Stairs;Ambulation PT Plan PT Intensity: Minimum of 1-2 x/day ,45 to 90 minutes PT Frequency: 5 out of 7 days;Total of 15 hours over 7 days of combined therapies PT Duration Estimated Length of Stay: 18-21 days PT Treatment/Interventions: Ambulation/gait training;Balance/vestibular training;Community reintegration;Cognitive remediation/compensation;Discharge planning;Functional electrical stimulation;DME/adaptive equipment instruction;Functional mobility training;Neuromuscular re-education;Patient/family education;Pain management;Psychosocial support;Stair training;Splinting/orthotics;Therapeutic Activities;Therapeutic Exercise;UE/LE Strength taining/ROM;UE/LE Coordination activities;Visual/perceptual remediation/compensation PT Transfers Anticipated Outcome(s): supervision PT Locomotion Anticipated Outcome(s): supervision ambulatory with LRAD PT Recommendation Follow Up Recommendations: Home health PT;24 hour supervision/assistance Patient destination: Home Equipment Recommended: To be determined  Skilled Therapeutic Intervention No c/o pain.  PT educated on rehab process, PT plan of care, and goals of therapy.  Pt currently performing mobility as below, requiring increased cues for motor planning and safety.  Pt  impulsive towards end of session when fatigued.  Pt returned to bed at end of session and positioned in bed with call bell in reach and needs met.   PT Evaluation Precautions/Restrictions Precautions Precautions: Fall Precaution Comments: NPO  NG tube, left neglect/visual field deficit, global aphasia Restrictions Weight Bearing Restrictions: No General PT Amount of Missed Time (min): 10 Minutes PT Missed Treatment Reason: Patient fatigue  Pain Pain Assessment Pain Assessment: No/denies pain Home Living/Prior Functioning Home Living Available Help at Discharge: Family;Available 24 hours/day(going to daughter's at d/c, will have hired care if needed for 24/7 supervision) Type of Home: Mobile home Home Access: Stairs to enter [employer] of Steps: 3 Entrance Stairs-Rails: Left Home Layout: One level  Lives With: Friend(s) Prior Function Level of Independence: Independent with transfers;Independent with gait  Able to Take Stairs?: Yes Driving: No Vocation: On disability Vision/Perception  Vision - Assessment Additional Comments: Pt unable to locate grooming or bathing items left of midline without max instructional cueing from therapist.  Did not visually scan across midline to the left when therapist helped turn his head to the left for locating washcloth.  Unable to assess vision with testing secondary to not being able to follow instructional commands consistently. Perception Perception: Impaired Inattention/Neglect: Does not attend to left visual field Praxis Praxis: Impaired Praxis Impairment Details: Ideomotor;Motor planning Praxis-Other Comments: Pt demonstrates difficulty coordinating left UE use during functional tasks, even though isolated movement is present in all joints.   Cognition Overall Cognitive Status: Impaired/Different from baseline Arousal/Alertness: Awake/alert Orientation Level: Oriented to person(unable to fully assess 2/2  dysarthria) Attention: Sustained Focused Attention: Impaired Focused Attention Impairment: Functional basic;Verbal basic Sustained Attention: Impaired Sustained Attention Impairment: Functional basic;Verbal basic Selective Attention: Impaired Selective Attention Impairment: Functional basic Memory: Impaired Memory Impairment: Decreased short term memory;Decreased long term memory;Decreased recall of new information Decreased Long Term Memory: Verbal basic;Functional basic Decreased Short Term Memory: Verbal basic;Functional basic Awareness: Impaired Awareness Impairment: Intellectual impairment Problem Solving: Impaired Problem Solving Impairment: Verbal basic;Functional basic Safety/Judgment: Impaired Comments: Pt demonstrates restlessness and decreased awareness of deficits with frequent attempts at getting up out of wheelchair.  Sensation Sensation Light Touch: Impaired by gross assessment Additional Comments: Pt able to detect  sensation in left hand and arm but unable to determine accurately secondary to receptive and expressive difficulties.  Coordination Gross Motor Movements are Fluid and Coordinated: No Fine Motor Movements are Fluid and Coordinated: No Coordination and Movement Description: Pt needs mod assist to use the LUE as an active assist for bathing, dressing, and application of deodorant.  Motor  Motor Motor: Hemiplegia  Mobility Bed Mobility Bed Mobility: Supine to Sit;Sit to Supine Supine to Sit: 4: Min assist Sit to Supine: 4: Min assist Transfers Transfers: Yes Sit to Stand: 4: Min assist Stand Pivot Transfers: 4: Min assist Locomotion  Ambulation Ambulation: Yes Ambulation/Gait Assistance: 4: Min assist Ambulation Distance (Feet): 100 Feet Assistive device: Other (Comment)(wrap around support) Ambulation/Gait Assistance Details: occasional staggering Stairs / Additional Locomotion Stairs: Yes Stairs Assistance: 4: Min assist Stair Management  Technique: One rail Right(wrap around support on L) Wheelchair Mobility Wheelchair Mobility: No  Trunk/Postural Assessment  Cervical Assessment Cervical Assessment: Exceptions to WFL(forward head) Thoracic Assessment Thoracic Assessment: Exceptions to WFL(rounded shoulder) Lumbar Assessment Lumbar Assessment: Exceptions to WFL(posterior pelvic tilt in sitting) Postural Control Postural Control: Deficits on evaluation Righting Reactions: delayed Protective Responses: delayed and insufficient  Balance Balance Balance Assessed: Yes Static Sitting Balance Static Sitting - Balance Support: Feet supported Static Sitting - Level of Assistance: 5: Stand by assistance Dynamic Sitting Balance Dynamic Sitting - Balance Support: During functional activity Dynamic Sitting - Level of Assistance: 4: Min Oncologist Standing - Balance Support: During functional activity Static Standing - Level of Assistance: 4: Min assist Dynamic Standing Balance Dynamic Standing - Balance Support: During functional activity Dynamic Standing - Level of Assistance: 4: Min assist Extremity Assessment  RUE Assessment RUE Assessment: Within Functional Limits(AROM and strength WFLS for selfcare tasks) LUE Assessment LUE Assessment: Exceptions to WFL(Pt demonstrates isolated joint movements throughout but exhibits motor planning deficits during functional use with mod facilitatino needed to use as an acitve assist for selfcare tasks. ) RLE Strength RLE Overall Strength Comments: generalized weakness LLE Assessment LLE Assessment: Exceptions to [facility] LLE Strength LLE Overall Strength Comments: generalized weakness   See Function Navigator for Current Functional Status.   Refer to Care Plan for Long Term Goals  Recommendations for other services: None   Discharge Criteria: Patient will be discharged from PT if patient refuses treatment 3 consecutive times without medical reason, if  treatment goals not met, if there is a change in medical status, if patient makes no progress towards goals or if patient is discharged from hospital.  The above assessment, treatment plan, treatment alternatives and goals were discussed and mutually agreed upon: by patient and by family  Stephania Fragmin 06/24/2017, 5:25 PM

## 2017-06-24 NOTE — Evaluation (Signed)
Speech Language Pathology Assessment and Plan  Patient Details  Name: OCTAVIEN FAGGART MRN: [id number] Date of Birth: January 19, 1941  SLP Diagnosis: Aphasia;Dysarthria;Speech and Language deficits;Dysphagia;Cognitive Impairments  Rehab Potential: Good ELOS: 16-18    Today's Date: 06/24/2017 SLP Individual Time: 2841-3244 SLP Individual Time Calculation (min): 56 min   Problem List:  Patient Active Problem List   Diagnosis Date Noted  . 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 Dysphagia, oropharyngeal 06/23/2017  . Respiratory failure (HCC)   . Palliative care encounter   . Aspiration pneumonia due to gastric secretions (HCC)   . Atrial fibrillation (HCC)   . 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 COPD (chronic obstructive pulmonary disease) (HCC)   . Emphysema lung (HCC)   . Hx of long term use of blood thinners   . Hypertension   . Stroke [facility]) 2013  . Thyroid disease    hypothyroidism   Past Surgical History:  Past Surgical History:  Procedure Laterality Date  . IR ANGIO EXTRACRAN SEL COM CAROTID INNOMINATE UNI R MOD SED  06/19/2017  . IR ANGIO VERTEBRAL SEL SUBCLAVIAN INNOMINATE UNI R MOD SED  06/19/2017  . RADIOLOGY WITH ANESTHESIA N/A 06/19/2017   Procedure: RADIOLOGY WITH ANESTHESIA;  Surgeon: Julieanne Cotton, MD;  Location: [facility] OR;  Service: Radiology;  Laterality: N/A;    Assessment / Plan / Recommendation Clinical Impression WNU:UVOZ L Burnsis a 77 y.o.right handed malewith history of previous left MCA infarctionwith residual speech difficulty, atrial fibrillation maintained on Coumadin, hypertension, COPD/tobacco abuse, presented2/19/2019with left side weakness in right gaze preference. Per chart review patient lives with a male companionand reportedly mobile and independent. Plans to stay with daughter on discharge. Cranial CT scan negative for acute changes. 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  Patient did receive TPA. INR on admission of 1.23. CT angiogram head and neck showed apparent occlusion of right insular anterior temporal M2 branch. CT cerebral perfusion scan showed acute changes in the right MCA territory compatible with infarction affecting the right inferior temporal lobe and right inferior parietal lobe. Echocardiogram with ejection fraction of 60% no wall motion abnormalities. Carotid arteriogram completed 06/19/2017 showing no acute occlusions, AV shunting or aneurysm noted. Patient did require short-term intubation.MRI of the brain 06/20/2017 showed a 3.2 cm acute hemorrhage within the right lateral temporal lobe. CT of the head follow-up 06/22/2017 shows evolving multifocal right MCA territory infarct with evolving focal hemorrhagic conversion right temporal lobe. Evolving nonhemorrhagic right PCA territory infarct. Aspirin for CVA prophylaxis currently on hold due to ICH. Cortraktube placedfor nutritional support. Completing a course of Unasyn for aspiration pneumonia.Physical and occupational therapy evaluations completed 06/21/2017 with recommendations of physical medicine rehabilitation consult.Patient was admitted for a comprehensive rehabilitation program  Pt seen this date for clinical swallow evaluation and communication + cognitive-linguistic evaluation in the setting of acute CVA. At bedside, pt presents with severe oral dysphagia c/b decreased labial seal, diminished lingual strength/ROM/coordination, poor mandibular stability/strength, and impaired bolus formation and A-P transit. Suspect severe pharyngeal dysphagia, as well, given significant delay in swallow initiation, which daughter reports to be consistent with baseline given h/o dysphagia from previous CVA and + s/sx concerning for aspiration including wet vocal quality at baseline with upper airway congestion likely indicative of poor secretion management. Pt observed with conservative trials of ice chips x 2  (semi-melted 2'/2 edentulous status) and 1/4 tsp of water with wet vocal quality appreciated on 2 out of 3 trials. Pt was  cued to produce volitional cough, at which time vocal quality appeared to return to baseline. At this time, continue to recommend NPO with alternate means of nutrition/hydration/medications via NGT.   Re: speech and language, pt presents with severe aphasia and profound dysarthria that impacts pt's auditory comprehension and verbal expression. Pt only intermittently following 1-step commands with extra processing time, repetition, and model prompts. At this time, pt attempts to gesture to needed items by pointing or providing an action with hands. Exhibits comprehension of basic yes/no questions via head nods/shakes with fairly good accuracy; however, deficits observed with more complex information. Repetition is severely impaired secondary to dysarthria; however, pt attempting to imitate 1- to 2-syllable words with model prompts and extra time; of note, pt is [facility], per daughter, which could be impacting performance on language tasks, as well.   At this time, it is difficult to fully assess cognitive status given severity of language deficits; however, pt only able to demonstrate orientation to self and exhibits poor intellectual awareness, R gaze preference, and restlessness + impulsivity.   Given clinical presentation and assessment information, pt would benefit from skilled ST intervention targeting aforementioned deficits to maximize pt's independence and decrease caregiver burden. SLP to continue to follow.   Skilled Therapeutic Interventions          BSE and communication-cognitive-linguistic evaluation was completed, including portions of the Western Aphasia Battery-Revised (Bedside Record Form).   SLP Assessment  Patient will need skilled Speech Lanaguage Pathology Services during CIR admission    Recommendations  SLP Diet Recommendations: NPO;Alternative means -  temporary Medication Administration: Via alternative means Oral Care Recommendations: Oral care QID Patient destination: Skilled Nursing Facility (SNF) Follow up Recommendations: 24 hour supervision/assistance;Skilled Nursing facility    SLP Frequency 3 to 5 out of 7 days   SLP Duration  SLP Intensity  SLP Treatment/Interventions 16-18  Minumum of 1-2 x/day, 30 to 90 minutes  Cognitive remediation/compensation;Cueing hierarchy;Multimodal communication approach;Patient/family education;Speech/Language facilitation;Dysphagia/aspiration precaution training    Pain Pain Assessment Pain Assessment: No/denies pain  Prior Functioning Cognitive/Linguistic Baseline: Within functional limits Type of Home: Mobile home  Lives With: Friend(s) Available Help at Discharge: Family;Available 24 hours/day(going to daughter's at d/c, will have hired care if needed for 24/7 supervision) Vocation: On disability  Function:  Eating Eating     Eating Assist Level: Helper performs IV, parenteral or tube feed           Cognition Comprehension Comprehension assist level: Understands basic 50 - 74% of the time/ requires cueing 25 - 49% of the time  Expression   Expression assist level: Expresses basic 25 - 49% of the time/requires cueing 50 - 75% of the time. Uses single words/gestures.  Social Interaction Social Interaction assist level: Interacts appropriately 25 - 49% of time - Needs frequent redirection.  Problem Solving Problem solving assist level: Solves basic 25 - 49% of the time - needs direction more than half the time to initiate, plan or complete simple activities  Memory Memory assist level: Recognizes or recalls 25 - 49% of the time/requires cueing 50 - 75% of the time   Short Term Goals: Week 1: SLP Short Term Goal 1 (Week 1): Pt will consume therapeutic PO trials of ice chips and/or tsp sips of water without overt s/sx of aspiration 100% of the time over 3 sessions. SLP Short  Term Goal 2 (Week 1): Pt will communicate wants/needs via multimodal communication including gestures, communication board, and verbal attempts given Max A verbal cues.  SLP Short Term Goal 3 (Week 1): Pt will imitate single-syllable functional words with ~50% intelligibility given Max A model prompts.  SLP Short Term Goal 4 (Week 1): Pt will follow functional 2-step commands within context given Mod A verbal and visual cues.   Refer to Care Plan for Long Term Goals  Recommendations for other services: None   Discharge Criteria: Patient will be discharged from SLP if patient refuses treatment 3 consecutive times without medical reason, if treatment goals not met, if there is a change in medical status, if patient makes no progress towards goals or if patient is discharged from hospital.  The above assessment, treatment plan, treatment alternatives and goals were discussed and mutually agreed upon: by patient and by family  Toma Copier A Janard Culp 06/24/2017, 5:29 PM

## 2017-06-24 NOTE — Progress Notes (Signed)
Initial Nutrition Assessment  DOCUMENTATION CODES:   Non-severe (moderate) malnutrition in context of chronic illness  INTERVENTION:  Will adjust TF regimen: Jevity 1.2 @ 80 mL/hr x20 hours (2 PM-10 AM) with 30 mL Prostat once/day and 150 mL free water TID. This regimen will provide 2020 kcal, 104 grams of protein, and 1741 mL free water.  NUTRITION DIAGNOSIS:   Moderate Malnutrition related to chronic illness as evidenced by mild muscle depletion, moderate muscle depletion, mild fat depletion, moderate fat depletion.  GOAL:   Patient will meet greater than or equal to 90% of their needs  MONITOR:   TF tolerance, Weight trends, Labs  REASON FOR ASSESSMENT:   Consult Enteral/tube feeding initiation and management  ASSESSMENT:   77 y.o. 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   BMI indicates normal weight. Pt was admitted to rehab yesterday. Performed NFPE again d/t change in status/new rehab admission. Weight has been stable since hospital admission on 06/19/17. Pt with Cortrak tube in place and remains NPO. He is receiving Jevity 1.2 @ 65 mL/hr with 200 mL free water TID. This regimen is providing 1872 kcal, 86 grams of protein, and 1859 mL free water. Will adjust TF regimen as outlined above.   Medications reviewed;  62.5 mcg Synthroid per Cortrak/day.  Labs reviewed; creatinine: 0.6 mg/dL, Ca: 8.8 mg/dL.      NUTRITION - FOCUSED PHYSICAL EXAM:    Most Recent Value  Orbital Region  Mild depletion  Upper Arm Region  Moderate depletion  Thoracic and Lumbar Region  Mild depletion  Buccal Region  Mild depletion  Temple Region  No depletion  Clavicle Bone Region  Mild depletion  Clavicle and Acromion Bone Region  Moderate depletion  Scapular Bone Region  Mild depletion   Dorsal Hand  Mild depletion  Patellar Region  Mild depletion  Anterior Thigh Region  Moderate depletion  Posterior Calf Region  Moderate depletion  Edema (RD Assessment)  None  Hair  Reviewed  Eyes  Reviewed  Mouth  Reviewed  Skin  Reviewed  Nails  Reviewed       Diet Order:  Fall precautions Diet NPO time specified  EDUCATION NEEDS:   No education needs have been identified at this time  Skin:  Skin Assessment: Reviewed RN Assessment  Last BM:  2/22  Height:   Ht Readings from Last 1 Encounters:  06/23/17 5\' 11"  (1.803 m)    Weight:   Wt Readings from Last 1 Encounters:  06/23/17 159 lb 2.8 oz (72.2 kg)    Ideal Body Weight:  78.18 kg  BMI:  Body mass index is 22.2 kg/m.  Estimated Nutritional Needs:   Kcal:  2020-2240  Protein:  100-115 grams  Fluid:  >/= 2 L/day     Trenton GammonJessica Elishua Radford, MS, RD, LDN, [facility] Inpatient Clinical Dietitian Pager # [phone] After hours/weekend pager # [phone]

## 2017-06-24 NOTE — Progress Notes (Signed)
Patient ID: KEINAN MYREN, male   DOB: 1941-02-01, 77 y.o.   MRN: [id number]   ELZIE YAHNKE is a 77 y.o. male who was admitted for CIR yesterday with Left side weaknesssecondary to right temporoparietal infarct status post TPA with hemorrhagic conversion as well as history of left MCA infarction with residual aphasia.  Past Medical History:  Diagnosis Date  . COPD (chronic obstructive pulmonary disease) (HCC)   . Emphysema lung (HCC)   . Hx of long term use of blood thinners   . Hypertension   . Stroke [facility]) 2013  . Thyroid disease    hypothyroidism    Patient Vitals for the past 24 hrs:  BP Temp Temp src Pulse Resp SpO2 Height Weight  06/24/17 0630 - - - - (!) 28 94 % - -  06/24/17 0332 - - - - (!) 26 96 % - -  06/24/17 0219 131/85 98.8 F (37.1 C) Axillary (!) 106 (!) 24 96 % - -  06/23/17 2000 - - - 98 (!) 26 97 % - -  06/23/17 1530 (!) 139/50 (!) 97.5 F (36.4 C) Oral (!) 104 (!) 24 97 % 5\' 11"  (1.803 m) 159 lb 2.8 oz (72.2 kg)     Subjective: No new complaints. No new problems. Slept well. Feeling OK.  Objective: Vital signs in last 24 hours: Temp:  [97.5 F (36.4 C)-98.8 F (37.1 C)] 98.8 F (37.1 C) (02/23 0219) Pulse Rate:  [98-106] 106 (02/23 0219) Resp:  [24-33] 28 (02/23 0630) BP: (131-139)/(50-85) 131/85 (02/23 0219) SpO2:  [94 %-98 %] 94 % (02/23 0630) Weight:  [159 lb 2.8 oz (72.2 kg)] 159 lb 2.8 oz (72.2 kg) (02/22 1530) Weight change:  Last BM Date: 06/23/17  Intake/Output from previous day: 02/22 0701 - 02/23 0700 In: 1430 [NG/GT:1130; IV Piggyback:300] Out: 2300 [Urine:2300] Last cbgs: CBG (last 3)  No results for input(s): GLUCAP in the last 72 hours.   Physical Exam General: No apparent distress but weak HEENT: Nasal cannula O2 in place; dry crusts involving hard palate; nasogastric feeding tube in place Lungs: Normal effort. Lungs clear to auscultation, no crackles or wheezes. Cardiovascular: Regular rate and rhythm, no edema Abdomen:  S/NT/ND; BS(+) Musculoskeletal:  unchanged Neurological: No new neurological deficits; moves all extremities Wounds: N/A    Extremities.  No edema Mental state: Alert, oriented, cooperative    Lab Results: BMET    Component Value Date/Time   NA 140 06/23/2017 0514   NA 139 07/18/2012 0429   K 3.5 06/23/2017 0514   K 3.8 07/18/2012 0429   CL 108 06/23/2017 0514   CL 105 07/18/2012 0429   CO2 19 (L) 06/23/2017 0514   CO2 25 07/18/2012 0429   GLUCOSE 93 06/23/2017 0514   GLUCOSE 93 07/18/2012 0429   BUN 8 06/23/2017 0514   BUN 16 07/18/2012 0429   CREATININE 0.60 (L) 06/23/2017 0514   CREATININE 0.74 07/18/2012 0429   CALCIUM 8.8 (L) 06/23/2017 0514   CALCIUM 8.7 07/18/2012 0429   GFRNONAA >60 06/23/2017 0514   GFRNONAA >60 07/18/2012 0429   GFRAA >60 06/23/2017 0514   GFRAA >60 07/18/2012 0429   CBC    Component Value Date/Time   WBC 7.9 06/23/2017 0514   RBC 3.47 (L) 06/23/2017 0514   HGB 12.3 (L) 06/23/2017 0514   HGB 15.5 07/18/2012 0429   HCT 34.3 (L) 06/23/2017 0514   HCT 43.4 07/18/2012 0429   PLT 140 (L) 06/23/2017 0514   PLT 201 07/18/2012  0429   MCV 98.8 06/23/2017 0514   MCV 96 07/18/2012 0429   MCH 35.4 (H) 06/23/2017 0514   MCHC 35.9 06/23/2017 0514   RDW 13.5 06/23/2017 0514   RDW 13.7 07/18/2012 0429   LYMPHSABS 0.8 06/23/2017 0514   LYMPHSABS 1.1 07/18/2012 0429   MONOABS 0.4 06/23/2017 0514   MONOABS 0.6 07/18/2012 0429   EOSABS 0.1 06/23/2017 0514   EOSABS 0.2 07/18/2012 0429   BASOSABS 0.0 06/23/2017 0514   BASOSABS 0.1 07/18/2012 0429    Studies/Results: No results found.  Medications: I have reviewed the patient's current medications.  Assessment/Plan:  Left side weaknesssecondary to right temporoparietal infarct status post TPA with hemorrhagic conversion as well as history of left MCA infarction with residual aphasia.  Continue CIR DVT prophylaxis.  Continue SCDs only Aspiration pneumonia.  Patient completing IV  Unasyn Dyslipidemia.  Continue atorvastatin Atrial fibrillation.  Continue rate control.  Continue to hold anticoagulation at this time      Length of stay, days: 1  Rogelia Boga , MD 06/24/2017, 12:59 PM

## 2017-06-24 NOTE — Progress Notes (Signed)
Pt has oxygen at 3 L O2 in bed and has continuous pulse oximeter in place. Pt was trialed with no O2 several times today during therapy and did very well with saturations in high 90's. RN will continue to monitor O2 needs.

## 2017-06-24 NOTE — Evaluation (Signed)
Occupational Therapy Assessment and Plan  Patient Details  Name: Ronald [name] MRN: [id number] Date of Birth: Jan 27, 1941  OT Diagnosis: abnormal posture, altered mental status, apraxia, cognitive deficits, hemiplegia affecting non-dominant side and muscle weakness (generalized) Rehab Potential: Rehab Potential (ACUTE ONLY): Good ELOS: 16-18   Today's Date: 06/24/2017 OT Individual Time: 2130-8657 OT Individual Time Calculation (min): 78 min     Problem List:  Patient Active Problem List   Diagnosis Date Noted  . 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 Dysphagia, oropharyngeal 06/23/2017  . Respiratory failure (HCC)   . Palliative care encounter   . Aspiration pneumonia due to gastric secretions (HCC)   . Atrial fibrillation (HCC)   . 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 COPD (chronic obstructive pulmonary disease) (HCC)   . Emphysema lung (HCC)   . Hx of long term use of blood thinners   . Hypertension   . Stroke [facility]) 2013  . Thyroid disease    hypothyroidism   Past Surgical History:  Past Surgical History:  Procedure Laterality Date  . IR ANGIO EXTRACRAN SEL COM CAROTID INNOMINATE UNI R MOD SED  06/19/2017  . IR ANGIO VERTEBRAL SEL SUBCLAVIAN INNOMINATE UNI R MOD SED  06/19/2017  . RADIOLOGY WITH ANESTHESIA N/A 06/19/2017   Procedure: RADIOLOGY WITH ANESTHESIA;  Surgeon: Julieanne Cotton, MD;  Location: [facility] OR;  Service: Radiology;  Laterality: N/A;    Assessment & Plan Clinical Impression: Patient is a 77 y.o. year old male with recent admission to the hospital on 2/19/2019with left side weakness in right gaze preference. Per chart review patient lives with a male companionand reportedly mobile and independent. Plans to stay with daughter on discharge. Cranial CT scan negative for acute changes. 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 Patient did receive TPA. INR on admission of 1.23. CT angiogram head  and neck showed apparent occlusion of right insular anterior temporal M2 branch. CT cerebral perfusion scan showed acute changes in the right MCA territory compatible with infarction affecting the right inferior temporal lobe and right inferior parietal lobe. Echocardiogram with ejection fraction of 60% no wall motion abnormalities. Carotid arteriogram completed 06/19/2017 showing no acute occlusions, AV shunting or aneurysm noted. Patient did require short-term intubation.MRI of the brain 06/20/2017 showed a 3.2 cm acute hemorrhage within the right lateral temporal lobe. CT of the head follow-up 06/22/2017 shows evolving multifocal right MCA territory infarct with evolving focal hemorrhagic conversion right temporal lobe. Evolving nonhemorrhagic right PCA territory infarct.  .  Patient transferred to CIR on 06/23/2017 .    Patient currently requires mod with basic self-care skills secondary to muscle weakness, impaired timing and sequencing, motor apraxia and decreased coordination, field cut, decreased attention to left and decreased motor planning, decreased attention, decreased awareness, decreased problem solving, decreased safety awareness, decreased memory and delayed processing and decreased standing balance, hemiplegia and decreased balance strategies.  Prior to hospitalization, patient could complete ADLs with independent .  Patient will benefit from skilled intervention to decrease level of assist with basic self-care skills and increase independence with basic self-care skills prior to discharge home with care partner.  Anticipate patient will require 24 hour supervision and follow up home health.  OT - End of Session Activity Tolerance: Decreased this session Endurance Deficit: Yes OT Assessment Rehab Potential (ACUTE ONLY): Good OT Patient demonstrates impairments in the following area(s): Balance;Motor;Cognition;Pain OT Basic ADL's Functional Problem(s):  Grooming;Dressing;Toileting;Bathing OT Transfers Functional Problem(s): Toilet;Tub/Shower OT  Additional Impairment(s): Fuctional Use of Upper Extremity OT Plan OT Intensity: Minimum of 1-2 x/day, 45 to 90 minutes OT Frequency: 5 out of 7 days OT Duration/Estimated Length of Stay: 16-18 OT Treatment/Interventions: Balance/vestibular training;DME/adaptive equipment instruction;UE/LE Strength taining/ROM;Patient/family education;Functional mobility training;Discharge planning;Self Care/advanced ADL retraining;Therapeutic Activities;UE/LE Coordination activities;Neuromuscular re-education;Cognitive remediation/compensation;Therapeutic Exercise;Visual/perceptual remediation/compensation OT Self Feeding Anticipated Outcome(s): Supervision  OT Basic Self-Care Anticipated Outcome(s): supervision OT Toileting Anticipated Outcome(s): supervision OT Bathroom Transfers Anticipated Outcome(s): supervision OT Recommendation Patient destination: Home Follow Up Recommendations: 24 hour supervision/assistance Equipment Recommended: To be determined   Skilled Therapeutic Intervention Pt completed selfcare retraining sit to stand at the sink.  Mod assist for stand pivot transfer to the wheelchair from EOB, with pt needing max demonstrational cueing to sit back down on the bed as he grabbed hold of therapist's shoulder and attempted to stand without cueing or therapist being ready.  Once in the chair he worked on bathing and dressing sit to stand at the sink.  Mod instructional cueing to sequence bathing and to not attempt dressing until he had completed washing all body parts.  Mod assist for integration of the LUE for squeezing out washcloth, pulling up pants, and helping to apply deodorant.  Pt unable to locate any selfcare or dressing items left of midline without max demonstrational cueing.  He donned the pullover shirt over his RUE first and then placed his LUE in through the neck opening and attempted to donn  without any idea it was not placed correctly.  He did better with donning underpants and pants as min assist level, but needed max assist for socks.  Pt left in wheelchair with safety belt and alarm in place.  Call button and phone in reach with daughter present.  Nursing notified to hook pt back up to feeding tube as well.    OT Evaluation Precautions/Restrictions  Precautions Precautions: Fall Precaution Comments: NPO  NG tube, left neglect/visual field deficit, global aphasia Restrictions Weight Bearing Restrictions: No  Pain  No report of pain during session   Home Living/Prior Functioning Home Living Family/patient expects to be discharged to:: Private residence Living Arrangements: Non-relatives/Friends, Other (Comment)(Friend - [name] ) Available Help at Discharge: (P) Family(will likely go to daughters house with 24 hour supervision) Type of Home: (P) Mobile home Home Access: (P) Stairs to enter Entrance Stairs-Number of Steps: (P) 4 Entrance Stairs-Rails: (P) Can reach both Home Layout: (P) One level Bathroom Shower/Tub: (P) Tub/shower unit, Walk-in shower(closest bathroom to his room will be tub shower but does have access to walk-in shower at daughter's house) Bathroom Toilet: (P) Standard Bathroom Accessibility: (P) Yes Additional Comments: (P) Pt lived with male friend, who is elderly.   Pt will likely stay with daughter after discharge from rehab facility   Lives With: (P) Friend(s) IADL History Current License: (P) No Occupation: (P) Retired Prior Function Level of Independence: (P) Independent with basic ADLs Driving: (P) No Vocation: (P) Retired ADL  See Function Section of chart for details  Vision Baseline Vision/History: No visual deficits Vision Assessment?: Vision impaired- to be further tested in functional context Additional Comments: Pt unable to locate grooming or bathing items left of midline without max instructional cueing from therapist.  Did not  visually scan across midline to the left when therapist helped turn his head to the left for locating washcloth.  Unable to assess vision with testing secondary to not being able to follow instructional commands consistently. Perception  Perception: Impaired Inattention/Neglect: Does not attend to left visual  field Praxis Praxis: Impaired Praxis Impairment Details: Ideomotor;Motor planning Praxis-Other Comments: Pt demonstrates difficulty coordinating left UE use during functional tasks, even though isolated movement is present in all joints.  Cognition Overall Cognitive Status: Impaired/Different from baseline Arousal/Alertness: Awake/alert Orientation Level: Place Memory: Impaired Memory Impairment: Decreased short term memory Immediate Memory Recall: (unable secondary to receptive and expressive difficulties.) Attention: Sustained;Selective Sustained Attention: Impaired Sustained Attention Impairment: Functional basic Selective Attention: Impaired Selective Attention Impairment: Functional basic Awareness: Impaired Awareness Impairment: Intellectual impairment Safety/Judgment: Impaired Comments: Pt demonstrates impulsivity, attempting to stand before therapist had cued him too.  After washing UB he attempted to donn UB clothing before completion of LB bathing. Sensation Sensation Additional Comments: Pt able to detect sensation in left hand and arm but unable to determine accurately secondary to receptive and expressive difficulties.  Coordination Gross Motor Movements are Fluid and Coordinated: No Fine Motor Movements are Fluid and Coordinated: No Coordination and Movement Description: Pt needs mod assist to use the LUE as an active assist for bathing, dressing, and application of deodorant.  Motor  Motor Motor: Hemiplegia Mobility    See function Section of chart for details  Trunk/Postural Assessment  Cervical Assessment Cervical Assessment: Exceptions to WFL(forward  head) Thoracic Assessment Thoracic Assessment: Exceptions to WFL(thoracic rounding) Lumbar Assessment Lumbar Assessment: Exceptions to WFL(posterior pelvic tilt in sitting) Postural Control Postural Control: Deficits on evaluation  Balance Balance Balance Assessed: Yes Static Sitting Balance Static Sitting - Balance Support: Feet supported Static Sitting - Level of Assistance: 5: Stand by assistance Dynamic Sitting Balance Dynamic Sitting - Balance Support: During functional activity Dynamic Sitting - Level of Assistance: 4: Min assist Static Standing Balance Static Standing - Balance Support: During functional activity Static Standing - Level of Assistance: 4: Min assist Dynamic Standing Balance Dynamic Standing - Balance Support: During functional activity Dynamic Standing - Level of Assistance: 3: Mod assist Extremity/Trunk Assessment RUE Assessment RUE Assessment: Within Functional Limits(AROM and strength WFLS for selfcare tasks) LUE Assessment LUE Assessment: Exceptions to WFL(Pt demonstrates isolated joint movements throughout but exhibits motor planning deficits during functional use with mod facilitatino needed to use as an acitve assist for selfcare tasks. )   See Function Navigator for Current Functional Status.   Refer to Care Plan for Long Term Goals  Recommendations for other services: None    Discharge Criteria: Patient will be discharged from OT if patient refuses treatment 3 consecutive times without medical reason, if treatment goals not met, if there is a change in medical status, if patient makes no progress towards goals or if patient is discharged from hospital.  The above assessment, treatment plan, treatment alternatives and goals were discussed and mutually agreed upon: by patient  Nikka Hakimian OTR/L 06/24/2017, 4:39 PM

## 2017-06-25 ENCOUNTER — Inpatient Hospital Stay (HOSPITAL_COMMUNITY): Payer: Self-pay

## 2017-06-25 ENCOUNTER — Inpatient Hospital Stay (HOSPITAL_COMMUNITY): Payer: Medicare Other

## 2017-06-25 DIAGNOSIS — R609 Edema, unspecified: Secondary | ICD-10-CM

## 2017-06-25 NOTE — Progress Notes (Signed)
Patient ID: Ronald [name], male   DOB: [birth year], 77 y.o.   MRN: [id number]   Ronald [name] is a 77 y.o. 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 COPD (chronic obstructive pulmonary disease) (HCC)   . Emphysema lung (HCC)   . Hx of long term use of blood thinners   . Hypertension   . Stroke [facility]) 2013  . Thyroid disease    hypothyroidism     Subjective: No new complaints.  No verbal response.  Nasogastric tube and nasal cannula in place  Objective: Vital signs in last 24 hours: Temp:  [98.2 F (36.8 C)-98.4 F (36.9 C)] 98.4 F (36.9 C) (02/24 0403) Pulse Rate:  [61-100] 100 (02/24 0403) Resp:  [20-26] 20 (02/24 0403) BP: (130-139)/(61-79) 130/79 (02/24 0403) SpO2:  [96 %-99 %] 97 % (02/24 0931) Weight change:  Last BM Date: 06/23/17  Intake/Output from previous day: 02/23 0701 - 02/24 0700 In: -  Out: 400 [Urine:400]  Patient Vitals for the past 24 hrs:  BP Temp Temp src Pulse Resp SpO2  06/25/17 0931 - - - - - 97 %  06/25/17 0816 - - - - - 96 %  06/25/17 0403 130/79 98.4 F (36.9 C) Oral 100 20 99 %  06/24/17 1500 139/61 98.2 F (36.8 C) Axillary 61 (!) 26 98 %    Physical Exam General: No apparent distress   HEENT: Dry crusted exudative lesions from the hard palate no longer present after vigorous mouth care yesterday Lungs: Normal effort. Lungs clear to auscultation, no crackles or wheezes. Cardiovascular: Irregular rate 100-110 at present Abdomen: S/NT/ND; BS(+) Musculoskeletal:  unchanged Neurological: No new neurological deficits Wounds: N/A    Extremities.  No edema Mental state: Alert, oriented, cooperative    Lab Results: BMET    Component Value Date/Time   NA 140 06/23/2017 0514   NA 139 07/18/2012 0429   K 3.5 06/23/2017 0514   K  3.8 07/18/2012 0429   CL 108 06/23/2017 0514   CL 105 07/18/2012 0429   CO2 19 (L) 06/23/2017 0514   CO2 25 07/18/2012 0429   GLUCOSE 93 06/23/2017 0514   GLUCOSE 93 07/18/2012 0429   BUN 8 06/23/2017 0514   BUN 16 07/18/2012 0429   CREATININE 0.60 (L) 06/23/2017 0514   CREATININE 0.74 07/18/2012 0429   CALCIUM 8.8 (L) 06/23/2017 0514   CALCIUM 8.7 07/18/2012 0429   GFRNONAA >60 06/23/2017 0514   GFRNONAA >60 07/18/2012 0429   GFRAA >60 06/23/2017 0514   GFRAA >60 07/18/2012 0429   CBC    Component Value Date/Time   WBC 7.9 06/23/2017 0514   RBC 3.47 (L) 06/23/2017 0514   HGB 12.3 (L) 06/23/2017 0514   HGB 15.5 07/18/2012 0429   HCT 34.3 (L) 06/23/2017 0514   HCT 43.4 07/18/2012 0429   PLT 140 (L) 06/23/2017 0514   PLT 201 07/18/2012 0429   MCV 98.8 06/23/2017 0514   MCV 96 07/18/2012 0429   MCH 35.4 (H) 06/23/2017 0514   MCHC 35.9 06/23/2017 0514   RDW 13.5 06/23/2017 0514   RDW 13.7 07/18/2012 0429   LYMPHSABS 0.8 06/23/2017 0514   LYMPHSABS 1.1 07/18/2012 0429   MONOABS 0.4 06/23/2017 0514   MONOABS 0.6 07/18/2012 0429   EOSABS 0.1 06/23/2017 0514  EOSABS 0.2 07/18/2012 0429   BASOSABS 0.0 06/23/2017 0514   BASOSABS 0.1 07/18/2012 0429    Medications: I have reviewed the patient's current medications.  Assessment/Plan:  Functional deficits secondary to right temporoparietal infarct with hemorrhagic conversion.  Continue CIR DVT prophylaxis.  Continue SCDs only in the setting of recent hemorrhagic stroke Aspiration pneumonia.  Completing Unasyn Dyslipidemia continue statin therapy Atrial fibrillation.  Continue rate control measures.  Ventricular response a bit more elevated presently.  We will continue to observe    Length of stay, days: 2  Rogelia Boga , MD 06/25/2017, 9:49 AM

## 2017-06-25 NOTE — Progress Notes (Signed)
**Note Ronald-Identified via Obfuscation** Occupational Therapy Session Note  Patient Details  Name: DEMTRIUS [name] MRN: [id number] Date of Birth: [birth year]  Today's Date: 06/25/2017 OT Individual Time: 8845-7334 OT Individual Time Calculation (min): 54 min    Short Term Goals: Week 1:  OT Short Term Goal 1 (Week 1): Pt will locate 2 bathing/selfcare items left of midline with no more than mod instructional cueing.  OT Short Term Goal 2 (Week 1): Pt will complete UB bathing with supervision in unsupported sitting. OT Short Term Goal 3 (Week 1): Pt will complete LB bathing sit to stand with supervision.  OT Short Term Goal 4 (Week 1): Pt will perform LB dressing with min assist sit to stand.  OT Short Term Goal 5 (Week 1): Pt will complete toilet transfers to elevated toilet or 3:1 with supervision using RW for support.   Skilled Therapeutic Interventions/Progress Updates:    1;1. Pt on RA entire session with O2 greather than 95% during session. Pt impulsive throughout session and demo difficulty following commands d/t aphasia requiring gestures to wait/stop behaviors/getting up without help. Pt supine>sititng EOB with touching A to elevate trunk. Pt sits EOB with touching A to don pull over shirt and thread BLE into pants using hemi technique spontaneously. Pt lays back in supin and crosses ankle over knee to doff/don B socks with A to don R sock over foot d/t decreased LUE coordination/pinch strength. Pt bridges hips in bed to advance pants past hips. Pt squat pivot transfer with cues to wait to initiate transfer prior to w/c being locked. OT propels w/c to/from all tx destinations for energy conservation. Pt sit to stand to play game of horse shoes with son standing on L holding horseshoe and cueing pt to scan L to retrieve and throw at target. Pt stands with MOD A for balance attempting to step towards target to add momentum to horse shoe toss. Pt and family educated on activities to complete outside of tx time and issued foam cubed to  work on tip to tip pinch/placing in cup for reaching/L scanning/NMR. Exited session with pt seated in bed, family present, exit alarm on and all needs met and RN in room  Therapy Documentation Precautions:  Precautions Precautions: Fall Precaution Comments: NPO  NG tube, left neglect/visual field deficit, global aphasia Restrictions Weight Bearing Restrictions: No General:   See Function Navigator for Current Functional Status.   Therapy/Group: Individual Therapy  Tonny Branch 06/25/2017, 1:53 PM

## 2017-06-25 NOTE — Progress Notes (Signed)
Pt on room air with oxygen ranging between 94-97%. When RN transferred to bathroom, oxygen stayed around 94% with minimal activity. Will continue to monitor pt.

## 2017-06-25 NOTE — Progress Notes (Signed)
Bilateral lower extremity venous duplex completed. No evidence of DVT, superficial thrombosis, or Baker's cyst. Ronald DeitersVirginia Eann [name], RVS 06/25/2017, 3:22 PM

## 2017-06-26 ENCOUNTER — Inpatient Hospital Stay (HOSPITAL_COMMUNITY): Payer: Self-pay

## 2017-06-26 ENCOUNTER — Inpatient Hospital Stay (HOSPITAL_COMMUNITY): Payer: Self-pay | Admitting: Occupational Therapy

## 2017-06-26 ENCOUNTER — Inpatient Hospital Stay (HOSPITAL_COMMUNITY): Payer: Self-pay | Admitting: Physical Therapy

## 2017-06-26 DIAGNOSIS — I482 Chronic atrial fibrillation: Secondary | ICD-10-CM

## 2017-06-26 DIAGNOSIS — I611 Nontraumatic intracerebral hemorrhage in hemisphere, cortical: Secondary | ICD-10-CM

## 2017-06-26 LAB — COMPREHENSIVE METABOLIC PANEL
ALBUMIN: 3 g/dL — AB (ref 3.5–5.0)
ALT: 23 U/L (ref 17–63)
ANION GAP: 10 (ref 5–15)
AST: 48 U/L — AB (ref 15–41)
Alkaline Phosphatase: 47 U/L (ref 38–126)
BILIRUBIN TOTAL: 1.4 mg/dL — AB (ref 0.3–1.2)
BUN: 16 mg/dL (ref 6–20)
CHLORIDE: 103 mmol/L (ref 101–111)
CO2: 24 mmol/L (ref 22–32)
Calcium: 8.7 mg/dL — ABNORMAL LOW (ref 8.9–10.3)
Creatinine, Ser: 0.6 mg/dL — ABNORMAL LOW (ref 0.61–1.24)
GFR calc Af Amer: >60 mL/min (ref >60)
GFR calc non Af Amer: >60 mL/min (ref >60)
GLUCOSE: 112 mg/dL — AB (ref 65–99)
POTASSIUM: 3.6 mmol/L (ref 3.5–5.1)
SODIUM: 137 mmol/L (ref 135–145)
TOTAL PROTEIN: 6 g/dL — AB (ref 6.5–8.1)

## 2017-06-26 LAB — CBC WITH DIFFERENTIAL/PLATELET
BASOS PCT: 0 %
Basophils Absolute: 0 10*3/uL (ref 0.0–0.1)
EOS ABS: 0.3 10*3/uL (ref 0.0–0.7)
EOS PCT: 4 %
HCT: 38.3 % — ABNORMAL LOW (ref 39.0–52.0)
Hemoglobin: 13.5 g/dL (ref 13.0–17.0)
Lymphocytes Relative: 15 %
Lymphs Abs: 1 10*3/uL (ref 0.7–4.0)
MCH: 34.5 pg — ABNORMAL HIGH (ref 26.0–34.0)
MCHC: 35.2 g/dL (ref 30.0–36.0)
MCV: 98 fL (ref 78.0–100.0)
MONO ABS: 0.5 10*3/uL (ref 0.1–1.0)
MONOS PCT: 8 %
Neutro Abs: 4.7 10*3/uL (ref 1.7–7.7)
Neutrophils Relative %: 73 %
PLATELETS: 211 10*3/uL (ref 150–400)
RBC: 3.91 MIL/uL — ABNORMAL LOW (ref 4.22–5.81)
RDW: 13.2 % (ref 11.5–15.5)
WBC: 6.4 10*3/uL (ref 4.0–10.5)

## 2017-06-26 LAB — GLUCOSE, CAPILLARY
GLUCOSE-CAPILLARY: 104 mg/dL — AB (ref 65–99)
GLUCOSE-CAPILLARY: 95 mg/dL (ref 65–99)
Glucose-Capillary: 122 mg/dL — ABNORMAL HIGH (ref 65–99)
Glucose-Capillary: 91 mg/dL (ref 65–99)
Glucose-Capillary: 130 mg/dL — ABNORMAL HIGH (ref 65–99)
Glucose-Capillary: 95 mg/dL (ref 65–99)
Glucose-Capillary: 102 mg/dL — ABNORMAL HIGH (ref 65–99)

## 2017-06-26 MED ORDER — NICOTINE 21 MG/24HR TD PT24
21.0000 mg | MEDICATED_PATCH | Freq: Every day | TRANSDERMAL | Status: DC
  Administered 2017-06-26 – 2017-07-07 (×12): 21 mg via TRANSDERMAL
  Filled 2017-06-26 (×13): qty 1

## 2017-06-26 MED ORDER — ASPIRIN EC 81 MG PO TBEC
81.0000 mg | DELAYED_RELEASE_TABLET | Freq: Every day | ORAL | Status: DC
  Administered 2017-06-26 – 2017-07-03 (×8): 81 mg via ORAL
  Filled 2017-06-26 (×8): qty 1

## 2017-06-26 NOTE — Progress Notes (Signed)
Physical Therapy Session Note  Patient Details  Name: Ronald [name] MRN: [id number] Date of Birth: Nov 04, 1940  Today's Date: 06/26/2017 PT Individual Time: 0900-1000 PT Individual Time Calculation (min): 60 min   Short Term Goals: Week 1:  PT Short Term Goal 1 (Week 1): Pt will ambulate 150' with LRAD and min assist PT Short Term Goal 2 (Week 1): Pt will negotiate 4 steps with 1 rail and min assist PT Short Term Goal 3 (Week 1): Pt will complete standardized balance assessment.   Skilled Therapeutic Interventions/Progress Updates: Pt presented in bed with dgt Debbie present agreeable to therapy. Pt attempting to put briefs on, PTA provided minA for threading pants. Pt performed supine to sit with minA and max verbal cues for sequencing due to impulsivity and decreased safety awareness. Performed sit to stand with minA and RW with cues for hand placement. Upon standing pt noted to still have brief on and noted episode of incontinence (BM). PTA assist with LB clothing management and performed pericare with pt standing with RW maintaining fair balance. After seated rest pt performed stand pivot transfer with max verbal cues for sequencing and safety. Pt then motioned need to toilet. Pt transferred to toilet in w/c and performed stand pivot transfer to toilet with use of wall rail (+BM). Performed stand pivot returned to w/c in same manner as prior. Pt instructed and performed w/c mobility with use of 4 extremities. Pt required mod multimodal cues for increased use of L extremities. Pt able to improve LLE with verbal cues with poor carryover of LUE. Performed stand pivot to mat minA and participated in standing balance reaching for horseshoes and LUE requiring max multimodal cues for use of LUE vs RUE. Pt motioned increased pain in RUE, performed reaching and placement of clothes pin with LUE required cues for use of LUE and minA for placement of clothes pin. Pt transferred back to w/c via squat pivot with  minA and transported back to room. Pt handed off to SLP in room at end of session next therapy session.      Therapy Documentation Precautions:  Precautions Precautions: Fall Precaution Comments: NPO  NG tube, left neglect/visual field deficit, global aphasia Restrictions Weight Bearing Restrictions: No General:   Vital Signs: Therapy Vitals Temp: 98.7 F (37.1 C) Temp Source: Axillary Pulse Rate: 99 BP: (!) 169/95 Patient Position (if appropriate): Lying Oxygen Therapy SpO2: 100 % O2 Device: Not Delivered Pain: Pain Assessment Pain Assessment: No/denies pain   See Function Navigator for Current Functional Status.   Therapy/Group: Individual Therapy  Erman Thum  Jannelle Notaro, PTA  06/26/2017, 4:01 PM

## 2017-06-26 NOTE — Progress Notes (Signed)
Patient information reviewed and entered into eRehab system by Berdena Cisek, RN, CRRN, PPS Coordinator.  Information including medical coding and functional independence measure will be reviewed and updated through discharge.     Per nursing patient was given "Data Collection Information Summary for Patients in Inpatient Rehabilitation Facilities with attached "Privacy Act Statement-Health Care Records" upon admission.  

## 2017-06-26 NOTE — Plan of Care (Signed)
  Progressing Consults RH STROKE PATIENT EDUCATION Description See Patient Education module for education specifics  06/26/2017 1301 - Progressing by Marylene LandGrecu, Everly Rubalcava R, RN RH BLADDER ELIMINATION RH STG MANAGE BLADDER WITH ASSISTANCE Description STG Manage Bladder With Min Assistance   06/26/2017 1301 - Progressing by Marylene LandGrecu, Ange Puskas R, RN Flowsheets Taken 06/26/2017 1301  STG: Pt will manage bladder with assistance 4-Minimal assistance RH SKIN INTEGRITY RH STG SKIN FREE OF INFECTION/BREAKDOWN Description No new breakdown with min assist   06/26/2017 1301 - Progressing by Marylene LandGrecu, Amiree No R, RN RH SAFETY RH STG ADHERE TO SAFETY PRECAUTIONS W/ASSISTANCE/DEVICE Description STG Adhere to Safety Precautions With min Assistance/Device.  06/26/2017 1301 - Progressing by Marylene LandGrecu, Carsyn Taubman R, RN Flowsheets Taken 06/26/2017 1301  STG:Pt will adhere to safety precautions with assistance/device 4-Minimal assistance RH COGNITION-NURSING RH STG ANTICIPATES NEEDS/CALLS FOR ASSIST W/ASSIST/CUES Description STG Anticipates Needs/Calls for Assist With Min Assistance/Cues.  06/26/2017 1301 - Progressing by Marylene LandGrecu, Mazi Schuff R, RN Flowsheets Taken 06/26/2017 1301  STG: Anticipates needs/calls for assistance with assistance/cues 4-Minimal assistance

## 2017-06-26 NOTE — Progress Notes (Signed)
Subjective/Complaints: Patient has been depressed according to daughter.  He has been on Celexa in the past but did not tolerate Zoloft. Review of systems difficult to obtain secondary to severe dysarthria  Objective: Vital Signs: Blood pressure (!) 161/82, pulse 71, temperature 98.4 F (36.9 C), temperature source Oral, resp. rate 20, height 5\' 11"  (1.803 m), weight 72.2 kg (159 lb 2.8 oz), SpO2 100 %. No results found. No results found for this or any previous visit (from the past 72 hour(s)).   HEENT: normal Cardio: RRR and no murmur Resp: CTA B/L and unlabored GI: BS positive and NT, ND Extremity:  No Edema Skin:   Bruise forearms and elbows Neuro: Confused, Abnormal Sensory difficult to assess due to poor concentration and Abnormal Motor 3/5 on Left sided 5/5 on right side Musc/Skel:  Swelling right elbow  Gen NAD Aphasia- non fluent  Assessment/Plan: 1. 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 Physiatrist is providing close team supervision and 24 hour management of active medical problems listed below. Physiatrist and rehab team continue to assess barriers to discharge/monitor patient progress toward functional and medical goals. FIM: Function - Bathing Position: Wheelchair/chair at sink Body parts bathed by patient: Left arm, Chest, Abdomen, Front perineal area, Buttocks, Right upper leg, Left upper leg, Right lower leg, Left lower leg Body parts bathed by helper: Back, Right arm  Function- Upper Body Dressing/Undressing What is the patient wearing?: Pull over shirt/dress Pull over shirt/dress - Perfomed by patient: Thread/unthread right sleeve, Put head through opening, Pull shirt over trunk Pull over  shirt/dress - Perfomed by helper: Thread/unthread left sleeve Function - Lower Body Dressing/Undressing What is the patient wearing?: Socks, Shoes, Pants, Underwear Position: Wheelchair/chair at [employer] - Performed by patient: Thread/unthread right underwear leg, Thread/unthread left underwear leg Underwear - Performed by helper: Pull underwear up/down Pants- Performed by patient: Thread/unthread right pants leg, Thread/unthread left pants leg Pants- Performed by helper: Pull pants up/down Socks - Performed by helper: Don/doff right sock, Don/doff left sock Shoes - Performed by patient: Don/doff right shoe, Don/doff left shoe(slip on shoes, no ties)  Function - Interior and spatial designer steps completed by patient: Adjust clothing prior to toileting Toileting steps completed by helper: Performs perineal hygiene, Adjust clothing after toileting Toileting Assistive Devices: Grab bar or rail Assist level: Touching or steadying assistance (Pt.75%)  Function - Archivist transfer assistive device: Elevated toilet seat/BSC over toilet Assist level to toilet: Touching or steadying assistance (Pt > 75%) Assist level from toilet: Touching or steadying assistance (Pt > 75%)  Function - Chair/bed transfer Chair/bed transfer method: Stand pivot Chair/bed transfer assist level: Moderate assist (Pt 50 - 74%/lift or lower) Chair/bed transfer assistive device: Armrests  Function - Locomotion: Wheelchair Will patient use wheelchair at discharge?: No Function - Locomotion: Ambulation Assistive device: Rail in hallway Max distance: 100' Assist level: Touching or steadying assistance (Pt > 75%) Assist level: Touching or steadying assistance (Pt > 75%) Assist level: Touching or steadying assistance (Pt > 75%) Assist level: Touching or steadying assistance (Pt > 75%) Walk 10 feet on uneven surfaces activity did not occur: Safety/medical concerns  Function - Comprehension Comprehension:  Auditory Comprehension assist level: Understands basic 50 - 74% of the time/ requires cueing 25 - 49% of the time  Function -  Expression Expression: Verbal, Nonverbal Expression assist level: Expresses basis less than 25% of the time/requires cueing >75% of the time.  Function - Social Interaction Social Interaction assist level: Interacts appropriately 25 - 49% of time - Needs frequent redirection.  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 DVT Prophylaxis/Anticoagulation: SCD's only given hemorrhage.Monitor for signs of DVT -check dopplers 3. Pain Management:Tylenol as needed 4. Mood:Provide emotional support 5. Neuropsych: This patientiscapable of making decisions on hisown behalf. 6. Skin/Wound Care:Routine skin checks 7. Fluids/Electrolytes/Nutrition:Routine I&O with follow upchemistries 8. Dysphagia.NPO. Cortrakfor nutritional support. Follow-up speech therapy 9.Aspiration pneumonia. Completed course of Unasyn -monitor for recurrent sx 10.Hypothyroidism. Synthroid 11.Hyperlipidemia.Lipitor 12.Atrial fibrillation. Cardiac rate controlled. Chronic Coumadin on hold d/t hemorrhage. Start ASA 81mg , repeat CT scan later this week to determine anticoag with warfarin timing 13.Tobacco abuse. Counseling   LOS (Days) 3 A FACE TO FACE  EVALUATION WAS PERFORMED  Erick Colacendrew E Durwin Davisson 06/26/2017, 6:04 AM

## 2017-06-26 NOTE — Care Management Note (Signed)
[facility] Individual Statement of Services  Patient Name:  Ronald [name]  Date:  06/26/2017  Welcome to the [facility].  Our goal is to provide you with an individualized program based on your diagnosis and situation, designed to meet your specific needs.  With this comprehensive rehabilitation program, you will be expected to participate in at least 3 hours of rehabilitation therapies Monday-Friday, with modified therapy programming on the weekends.  Your rehabilitation program will include the following services:  Physical Therapy (PT), Occupational Therapy (OT), Speech Therapy (ST), 24 hour per day rehabilitation nursing, Therapeutic Recreaction (TR), Case Management (Social Worker), Rehabilitation Medicine, Nutrition Services, Pharmacy Services and Other  Weekly team conferences will be held on Wednesday to discuss your progress.  Your Social Worker will talk with you frequently to get your input and to update you on team discussions.  Team conferences with you and your family in attendance may also be held.  Expected length of stay: 18-21 days Overall anticipated outcome: supervision-set-up  Depending on your progress and recovery, your program may change. Your Social Worker will coordinate services and will keep you informed of any changes. Your Social Worker's name and contact numbers are listed  below.  The following services may also be recommended but are not provided by the [facility]:    Home Health Rehabiltiation Services  Outpatient Rehabilitation Services    Arrangements will be made to provide these services after discharge if needed.  Arrangements include referral to agencies that provide these services.  Your insurance has been verified to be:  Medicare Part A & B Your primary doctor is:  De HollingsheadJared Masoud  Pertinent information will be shared with your doctor and your insurance company.  Social Worker:  Dossie DerBecky  Seline Enzor, SW [phone] or ([phone]) [phone]  Information discussed with and copy given to patient by: Lucy Chrisupree, Jeffren Dombek G, 06/26/2017, 9:33 AM

## 2017-06-26 NOTE — IPOC Note (Signed)
Overall Plan of Care [facility]) Patient Details Name: Ronald [name] MRN: [id number] DOB: 06-Feb-1941  Admitting Diagnosis: Parietal lobe infarction  Hospital Problems: Principal Problem:   Parietal lobe infarction Active Problems:   Dysphagia, oropharyngeal   Aspiration pneumonia due to gastric secretions (HCC)   Atrial fibrillation (HCC)     Functional Problem List: Nursing Endurance, Bladder, Medication Management, Nutrition, Safety, Skin Integrity  PT Balance, Endurance, Motor, Skin Integrity, Safety, Perception  OT Balance, Motor, Cognition, Pain  SLP Cognition  TR         Basic ADL's: OT Grooming, Dressing, Toileting, Bathing     Advanced  ADL's: OT       Transfers: PT Bed Mobility, Bed to Chair, Car, Occupational psychologist, Research scientist (life sciences): PT Stairs, Ambulation     Additional Impairments: OT Fuctional Use of Upper Extremity  SLP Swallowing, Communication comprehension, expression    TR      Anticipated Outcomes Item Anticipated Outcome  Self Feeding Supervision   Swallowing  Min A    Basic self-care  supervision  Toileting  supervision   Bathroom Transfers supervision  Bowel/Bladder  Manage Bowel and Bladder with Minimal Assistance.   Transfers  supervision  Locomotion  supervision ambulatory with LRAD  Communication  Min A  Cognition  Min A  Pain  4 or Less due to Chronic Pain.   Safety/Judgment  Minimal Assist.   Therapy Plan: PT Intensity: Minimum of 1-2 x/day ,45 to 90 minutes PT Frequency: 5 out of 7 days, Total of 15 hours over 7 days of combined therapies PT Duration Estimated Length of Stay: 18-21 days OT Intensity: Minimum of 1-2 x/day, 45 to 90 minutes OT Frequency: 5 out of 7 days OT Duration/Estimated Length of Stay: 16-18 SLP Intensity: Minumum of 1-2 x/day, 30 to 90 minutes SLP Frequency: 3 to 5 out of 7 days SLP Duration/Estimated Length of Stay: 16-18    Team Interventions: Nursing Interventions Patient/Family  Education, Bladder Management, Skin Care/Wound Management, Pain Management, Medication Management, Dysphagia/Aspiration Precaution Training, Disease Management/Prevention  PT interventions Ambulation/gait training, Warden/ranger, Community reintegration, Cognitive remediation/compensation, Discharge planning, Functional electrical stimulation, DME/adaptive equipment instruction, Functional mobility training, Neuromuscular re-education, Patient/family education, Pain management, Psychosocial support, Stair training, Splinting/orthotics, Therapeutic Activities, Therapeutic Exercise, UE/LE Strength taining/ROM, UE/LE Coordination activities, Visual/perceptual remediation/compensation  OT Interventions Warden/ranger, DME/adaptive equipment instruction, UE/LE Strength taining/ROM, Patient/family education, Functional mobility training, Discharge planning, Self Care/advanced ADL retraining, Therapeutic Activities, UE/LE Coordination activities, Neuromuscular re-education, Cognitive remediation/compensation, Therapeutic Exercise, Visual/perceptual remediation/compensation  SLP Interventions Cognitive remediation/compensation, Cueing hierarchy, Multimodal communication approach, Patient/family education, Speech/Language facilitation, Dysphagia/aspiration precaution training  TR Interventions    SW/CM Interventions Discharge Planning, Psychosocial Support, Patient/Family Education   Barriers to Discharge MD  Medical stability  Nursing      PT Medical stability    OT      SLP      SW       Team Discharge Planning: Destination: PT-Home ,OT- Home , SLP-Skilled Nursing Facility (SNF) Projected Follow-up: PT-Home health PT, 24 hour supervision/assistance, OT-  24 hour supervision/assistance, SLP-24 hour supervision/assistance, Skilled Nursing facility Projected Equipment Needs: PT-To be determined, OT- To be determined, SLP-  Equipment Details: PT- , OT-  Patient/family  involved in discharge planning: PT- Patient, Family member/caregiver,  OT-Patient, Family member/caregiver, SLP-Patient, Family member/caregiver  MD ELOS: 21-25d Medical Rehab Prognosis:  Good Assessment:  77 y.o.right handed malewith history of previous left MCA infarctionwith residual speech difficulty, atrial fibrillation maintained on  Coumadin, hypertension, COPD/tobacco abuse, presented2/19/2019with left side weakness in right gaze preference. Per chart review patient lives with a male companionand reportedly mobile and independent. Plans to stay with daughter on discharge. Cranial CT scan negative for acute changes. 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 Patient did receive TPA. INR on admission of 1.23. CT angiogram head and neck showed apparent occlusion of right insular anterior temporal M2 branch. CT cerebral perfusion scan showed acute changes in the right MCA territory compatible with infarction affecting the right inferior temporal lobe and right inferior parietal lobe. Echocardiogram with ejection fraction of 60% no wall motion abnormalities. Carotid arteriogram completed 06/19/2017 showing no acute occlusions, AV shunting or aneurysm noted. Patient did require short-term intubation.MRI of the brain 06/20/2017 showed a 3.2 cm acute hemorrhage within the right lateral temporal lobe. CT of the head follow-up 06/22/2017 shows evolving multifocal right MCA territory infarct with evolving focal hemorrhagic conversion right temporal lobe. Evolving nonhemorrhagic right PCA territory infarct. Aspirin for CVA prophylaxis currently on hold due to ICH. Cortraktube placedfor nutritional support. Completing a course of Unasyn for aspiration pneumonia   Now requiring 24/7 Rehab RN,MD, as well as CIR level PT, OT and SLP.  Treatment team will focus on ADLs and mobility with goals set at Sup /MinA  See Team Conference Notes for weekly updates to the plan of care

## 2017-06-26 NOTE — Progress Notes (Addendum)
Occupational Therapy Session Note  Patient Details  Name: Ronald [name] MRN: [id number] Date of Birth: 1941-02-04  Today's Date: 06/26/2017 OT Individual Time: [id number] and [id number] OT Individual Time Calculation (min): 45 min and 41 min   Short Term Goals: Week 1:  OT Short Term Goal 1 (Week 1): Pt will locate 2 bathing/selfcare items left of midline with no more than mod instructional cueing.  OT Short Term Goal 2 (Week 1): Pt will complete UB bathing with supervision in unsupported sitting. OT Short Term Goal 3 (Week 1): Pt will complete LB bathing sit to stand with supervision.  OT Short Term Goal 4 (Week 1): Pt will perform LB dressing with min assist sit to stand.  OT Short Term Goal 5 (Week 1): Pt will complete toilet transfers to elevated toilet or 3:1 with supervision using RW for support.   Skilled Therapeutic Interventions/Progress Updates:    Session One: Pt seen for OT ADL bathing/dressing session. Pt in supine upon arrival,a greeable to tx session. He required max multimodal cuing to exit bed on L side due to severe L inattentin/neglect. Pt very impulsive with all mobility, standing to transfer without therapist ready, steadying assist and cuing for sequencing for safe transfer to w/c. He completed bathing/dressing from w/c level at sink, max cuing to locate first self care item and pt able to locate remainder of items placed on L side with slightly increased time. He was able to sequence dressing task correctly, however, required assist for clothing orientation as pt not attending to L side of garment and attempting to thread all extremities through R side of clothing item. He did attend to all body parts during bathing task and able to use L UE at functional level to wash R UE. He stood at isnk to complete pericare/buttock hygiene and LB clothing management with steadying assist.  He completed shaving task from w/c level Building surveyorusisng electric razor, therapist assisted for  thoroughness. Pt taken to nurses station for supervision, left seated with QRB donned.   Session Two: Pt seen for OT session focusing on cognitive remediation and neuro re-ed. Pt recevied sitting at nurses station, agreeable to tx session.  In therapy gym, attempted to have pt complete peg board task of copying from picture basic two step pattern, alternating red/blue. Despite  Multiple multimodal attempts at directions, pt unable to sequence/ follow directions for task. Creating his own pattern and unable to follow any commands given by therapist.  He was able to attend to L side to retrieve pegs from container and alternated use of R/L  Hand. He was able to correctly orient pegs to fit into board correctly. Pt returned to room at end of session, RN requesting pt return to bed for chest PT. Pt impulsive with mobility, standing pre-maturely prior to set-up. Pt left in supine at end of session with RN present.   Therapy Documentation Precautions:  Precautions Precautions: Fall Precaution Comments: NPO  NG tube, left neglect/visual field deficit, global aphasia Restrictions Weight Bearing Restrictions: No Pain:   No/denies pain  See Function Navigator for Current Functional Status.   Therapy/Group: Individual Therapy  Ronald [name] L 06/26/2017, 7:09 AM

## 2017-06-26 NOTE — Progress Notes (Signed)
RT attempted twice to give CPT but pt in therapy both times.

## 2017-06-26 NOTE — Progress Notes (Signed)
Speech Language Pathology Daily Session Note  Patient Details  Name: Ronald [name] MRN: [id number] Date of Birth: January 28, 1941  Today's Date: 06/26/2017 SLP Individual Time: 1000-1100 SLP Individual Time Calculation (min): 60 min  Short Term Goals: Week 1: SLP Short Term Goal 1 (Week 1): Pt will consume therapeutic PO trials of ice chips and/or tsp sips of water without overt s/sx of aspiration 100% of the time over 3 sessions. SLP Short Term Goal 2 (Week 1): Pt will communicate wants/needs via multimodal communication including gestures, communication board, and verbal attempts given Max A verbal cues.  SLP Short Term Goal 3 (Week 1): Pt will imitate single-syllable functional words with ~50% intelligibility given Max A model prompts.  SLP Short Term Goal 4 (Week 1): Pt will follow functional 2-step commands within context given Mod A verbal and visual cues.   Skilled Therapeutic Interventions: Skilled ST services focused on swallow and speech skills. SLP facilitated oral care with suction toothbrush piror to trials of ice chips/TSP thin. Pt demonstrated ability to follow basic functional directions for brushing teeth and swishing/spitting medication to treat thrust with Mod-Min A verbal cues. Pt consumed 10 trials of ice chips demonstrated 2 delayed cough s/s aspiration and clear vocal quality, however given TSP thin pt demonstrated severe impairment in labial seal resulting in anterior spillage and wet vocal quality. Pt demonstrated management of secretions, wiping mouth with washcloth given Min A verbal cues. SLP facilitated auditory comprehension via response to basic yes/no question with 80% accuracy given min A visual/verbal cues and 30% accuracy with complex yes/no questions. Pt demonstrated functional use for common objects ( From [employer]) given min A verbal cues, was unable to point to object named in filed of two because was focused on naming each item. Pt required total- max a verbal and visual  cues to scan past midline, favoriting visual field on right side. Pt demonstrated ability to name items with Max A verb cues and speech intelligibility at word with 30% accuracy. Daughter confirmed baseline ability, 80% intelligible at word level, could express wants/needs used gestured or objects to clarify message and is unable to read and write. SLP attempted communication board however the number of items was distracting. SLP will attempt simplest picture board for communication in future session to aid in expressing wants/needs. Pt was left in room with call bell within reach. Recommend to continue skilled ST services.       Function:  Eating Eating   Modified Consistency Diet: No(ice chip trials with SLP) Eating Assist Level: Supervision or verbal cues;Helper brings food to mouth           Cognition Comprehension Comprehension assist level: Understands basic 50 - 74% of the time/ requires cueing 25 - 49% of the time  Expression   Expression assist level: Expresses basis less than 25% of the time/requires cueing >75% of the time.;Expresses basic 25 - 49% of the time/requires cueing 50 - 75% of the time. Uses single words/gestures.  Social Interaction Social Interaction assist level: Interacts appropriately 25 - 49% of time - Needs frequent redirection.  Problem Solving Problem solving assist level: Solves basic less than 25% of the time - needs direction nearly all the time or does not effectively solve problems and may need a restraint for safety  Memory Memory assist level: Recognizes or recalls less than 25% of the time/requires cueing greater than 75% of the time    Pain Pain Assessment Pain Assessment: No/denies pain  Therapy/Group: Individual Therapy  Adalynd Donahoe  Orin Eberwein 06/26/2017, 12:29 PM

## 2017-06-26 NOTE — Progress Notes (Signed)
Social Work  Social Work Assessment and Plan  Patient Details  Name: Ronald [name] MRN: [id number] Date of Birth: [birth year]  Today's Date: 06/26/2017  Problem List:  Patient Active Problem List   Diagnosis Date Noted  . 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 Dysphagia, oropharyngeal 06/23/2017  . Respiratory failure (HCC)   . Palliative care encounter   . Aspiration pneumonia due to gastric secretions (HCC)   . Atrial fibrillation (HCC)   . 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 COPD (chronic obstructive pulmonary disease) (HCC)   . Emphysema lung (HCC)   . Hx of long term use of blood thinners   . Hypertension   . Stroke [facility]) 2013  . Thyroid disease    hypothyroidism   Past Surgical History:  Past Surgical History:  Procedure Laterality Date  . IR ANGIO EXTRACRAN SEL COM CAROTID INNOMINATE UNI R MOD SED  06/19/2017  . IR ANGIO VERTEBRAL SEL SUBCLAVIAN INNOMINATE UNI R MOD SED  06/19/2017  . RADIOLOGY WITH ANESTHESIA N/A 06/19/2017   Procedure: RADIOLOGY WITH ANESTHESIA;  Surgeon: Julieanne Cotton, MD;  Location: [facility] OR;  Service: Radiology;  Laterality: N/A;   Social History:  reports that he has been smoking cigarettes.  He has been smoking about 1.00 pack per day. He uses smokeless tobacco. He reports that he drinks alcohol. He reports that he does not use drugs.  Family / Support Systems Marital Status: Divorced Patient Roles: Parent, Other (Comment)(Friend) Children: Debbie Smith-daughter [phone] Other Supports: Mary-friend/roommate   Anticipated Caregiver: Daughter and she will hire assist while she is working during the day Ability/Limitations of Caregiver: Daughter is over rehab at [facility] M-F 8:00-4:30 pm and will hire person while working Caregiver Availability: Other (Comment)(working on a 24 hr care plan) Family Dynamics: Close knit family pt has three children-two son's and one  daughter all are involved and supportive. His friend-Mary is supportive and will assist if she can but is limited due to her knee issues. He has a few freinds who visit also.  Social History Preferred language: English Religion: None Cultural Background: No issues Education: High School-doesn't read or write according to daughter Read: No(more limited from first stroke) Write: No(more limited from first stroke) Employment Status: Retired Fish farm manager Issues: No issues Guardian/Conservator: None-according to MD pt is capable of making his own decisions, due to his speech and language issues will make sure daughter is here if any decisions need to be made while here   Abuse/Neglect Abuse/Neglect Assessment Can Be Completed: Yes Physical Abuse: Denies Verbal Abuse: Denies Sexual Abuse: Denies Exploitation of patient/patient's resources: Denies Self-Neglect: Denies  Emotional Status Pt's affect, behavior adn adjustment status: Pt defers to his daughter to answer questions due to his speech deficits. Daughter reports he went to her home after his first stroke and recovered and then went back with [facility]. She is hoping this will happen again. She voiced he has made progress already since coming into the hospital. 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 Recent Psychosocial Issues: past history of stroke and other health issues Pyschiatric History: No history deferred depression screen due to his speech deficits, will continue to assess. Feel he would benefit from seeing neuro-psych while here due this is his second stroke and he is dealing with this again. Will make referral when appropriate. Substance Abuse History: Tobacco and ETOH aware he neeeds  to quit and palsn to try now this has happened again. Will continue to discuss resources while he is recovering and here for rehab.  Patient / Family Perceptions, Expectations & Goals Pt/Family  understanding of illness & functional limitations: Daughter can explain his stroke and deficits, she is a Charity fundraiser and works with rehab patients in a NH. She has spoken with the MD and feels she has a good understanding of his treatment plan and going forward from here. He does seem to understand what has happened to him and his deficits. Premorbid pt/family roles/activities: Father, retiree, friend, chicken farmer,etc Anticipated changes in roles/activities/participation: resume Pt/family expectations/goals: Pt states: " Do good"  Daughter states: " I hope he does as well as last time and he has already made progress since coming to the hospital."  [employer]: Other (Comment)(In the past after first CVA) Premorbid Home Care/DME Agencies: Other (Comment)(after first CVA) Transportation available at discharge: Family and [facility] referrals recommended: Neuropsychology, Support group (specify)  Discharge Planning Living Arrangements: Non-relatives/Friends Support Systems: Children, Friends/neighbors Type of Residence: Private residence [employer] Resources: Electrical engineer Resources: Restaurant manager, fast food Screen Referred: No Living Expenses: Psychologist, sport and exercise Management: Patient, Other (Comment) Does the patient have any problems obtaining your medications?: No Home Management: Both he and Probation officer Preliminary Plans: Plans to go to daughter's home like he did after his first stroke. Daughter will hire assist wheil she works M-F first shift. Both are hopeful he will recover and can return to his apartment with [facility] but needs to be mod/i to be able to do this. Speech reprots MBS later this week so await results of this. Daughtrer aware receommendaiton is 24 hr care from rehab,. Sw Barriers to Discharge: Decreased caregiver support Sw Barriers to Discharge Comments: Daughter will need to hire caregiver during the day while she works Social Work Anticipated  Follow Up Needs: HH/OP, Support Group  Clinical Impression Pleasant gentleman who is trying in therapies and has exhausted himself. His daughter is here to provide support and will plan to hire assist along with provide care in the evenings when she is off from work. Her job is Charity fundraiser over rehab services in a NH so she is very familiar with rehab and the services we provide. Will await his progress and work with daughter on a safe discharge plan. DO feel he would benefit from seeing neuro-psych while here, when appropriate.  Lucy Chris 06/26/2017, 1:37 PM

## 2017-06-27 ENCOUNTER — Inpatient Hospital Stay (HOSPITAL_COMMUNITY): Payer: Self-pay | Admitting: Occupational Therapy

## 2017-06-27 ENCOUNTER — Inpatient Hospital Stay (HOSPITAL_COMMUNITY): Payer: Self-pay | Admitting: Physical Therapy

## 2017-06-27 ENCOUNTER — Inpatient Hospital Stay (HOSPITAL_COMMUNITY): Payer: Self-pay

## 2017-06-27 LAB — GLUCOSE, CAPILLARY
GLUCOSE-CAPILLARY: 93 mg/dL (ref 65–99)
Glucose-Capillary: 110 mg/dL — ABNORMAL HIGH (ref 65–99)

## 2017-06-27 NOTE — Progress Notes (Signed)
Occupational Therapy Session Note  Patient Details  Name: Ronald [name] MRN: [id number] Date of Birth: Jul 26, 1940  Today's Date: 06/27/2017 OT Individual Time: 1045-1200 OT Individual Time Calculation (min): 75 min    Short Term Goals: Week 1:  OT Short Term Goal 1 (Week 1): Pt will locate 2 bathing/selfcare items left of midline with no more than mod instructional cueing.  OT Short Term Goal 2 (Week 1): Pt will complete UB bathing with supervision in unsupported sitting. OT Short Term Goal 3 (Week 1): Pt will complete LB bathing sit to stand with supervision.  OT Short Term Goal 4 (Week 1): Pt will perform LB dressing with min assist sit to stand.  OT Short Term Goal 5 (Week 1): Pt will complete toilet transfers to elevated toilet or 3:1 with supervision using RW for support.   Skilled Therapeutic Interventions/Progress Updates:    Pt seen for OT ADL bathing/dressing session and session focusing on attention and cognitive remediation. Pt in supine upon arrival with male friend present, pt nodding in agreement to tx session. He ambulated throughout session with min-mod HHA, pt reaching out for furniture while ambulating, demonstrating difficulty following commands to not reach as well as for navigation skills.  He bathed seated on tub bench, able to use L UE at functional level to manage self care items and use to wash body. He dressed seated on toilet, able to sequence all tasks correctly, however, did not attend to clothing direction and therefore required total A multimodal cuing to recognize and correct problem. Following rest break, pt self propelled w/c to [employer] gym. Following multimodal cuing, pt able to complete dynavision activity from w/c level with emphasis on L attention. Completed multiple trials, with 3 rings activated due to pt's severe L neglect. Pt able to hit 19 targets during 2 minute trial, averaging 6.32 seconds to locate targets on R/L.  Pt then completed sorting activity,  separating game pieces by colors when presented with two colors. Initially required max demonstrational cuing, however, quickly progressed to being able to complete at supervision level. He required total A cuing to to replicate simple pattern of 3 tokens. Pt returned to room at end of session, mod HHA to return to EOB. Pt left in supine with all needs in reach, bed alarm on and friend present.   Therapy Documentation Precautions:  Precautions Precautions: Fall Precaution Comments: NPO  NG tube, left neglect/visual field deficit, global aphasia Restrictions Weight Bearing Restrictions: No Pain:   No/denies pain  See Function Navigator for Current Functional Status.   Therapy/Group: Individual Therapy  Neely Cecena L 06/27/2017, 7:09 AM

## 2017-06-27 NOTE — Progress Notes (Signed)
Subjective/Complaints: Patient sleeping but is easily awakened. Review of systems difficult to obtain secondary to severe dysarthria  Objective: Vital Signs: Blood pressure (!) 177/85, pulse 60, temperature 97.9 F (36.6 C), temperature source Oral, resp. rate 18, height '5\' 11"'  (1.803 m), weight 72.2 kg (159 lb 2.8 oz), SpO2 97 %. No results found. Results for orders placed or performed during the hospital encounter of 06/23/17 (from the past 72 hour(s))  Glucose, capillary     Status: None   Collection Time: 06/24/17 12:12 PM  Result Value Ref Range   Glucose-Capillary 95 65 - 99 mg/dL  Glucose, capillary     Status: Abnormal   Collection Time: 06/24/17  5:13 PM  Result Value Ref Range   Glucose-Capillary 122 (H) 65 - 99 mg/dL  Glucose, capillary     Status: None   Collection Time: 06/24/17  9:32 PM  Result Value Ref Range   Glucose-Capillary 91 65 - 99 mg/dL  Glucose, capillary     Status: Abnormal   Collection Time: 06/25/17  5:11 AM  Result Value Ref Range   Glucose-Capillary 130 (H) 65 - 99 mg/dL  Glucose, capillary     Status: None   Collection Time: 06/25/17 11:35 AM  Result Value Ref Range   Glucose-Capillary 95 65 - 99 mg/dL  Glucose, capillary     Status: Abnormal   Collection Time: 06/26/17  5:28 AM  Result Value Ref Range   Glucose-Capillary 102 (H) 65 - 99 mg/dL  CBC WITH DIFFERENTIAL     Status: Abnormal   Collection Time: 06/26/17  6:01 AM  Result Value Ref Range   WBC 6.4 4.0 - 10.5 K/uL   RBC 3.91 (L) 4.22 - 5.81 MIL/uL   Hemoglobin 13.5 13.0 - 17.0 g/dL   HCT 38.3 (L) 39.0 - 52.0 %   MCV 98.0 78.0 - 100.0 fL   MCH 34.5 (H) 26.0 - 34.0 pg   MCHC 35.2 30.0 - 36.0 g/dL   RDW 13.2 11.5 - 15.5 %   Platelets 211 150 - 400 K/uL   Neutrophils Relative % 73 %   Neutro Abs 4.7 1.7 - 7.7 K/uL   Lymphocytes Relative 15 %   Lymphs Abs 1.0 0.7 - 4.0 K/uL   Monocytes Relative 8 %   Monocytes Absolute 0.5 0.1 - 1.0 K/uL   Eosinophils Relative 4 %   Eosinophils  Absolute 0.3 0.0 - 0.7 K/uL   Basophils Relative 0 %   Basophils Absolute 0.0 0.0 - 0.1 K/uL    Comment: Performed at [facility], [address], [place], [place] [address]  Comprehensive metabolic panel     Status: Abnormal   Collection Time: 06/26/17  6:01 AM  Result Value Ref Range   Sodium 137 135 - 145 mmol/L   Potassium 3.6 3.5 - 5.1 mmol/L   Chloride 103 101 - 111 mmol/L   CO2 24 22 - 32 mmol/L   Glucose, Bld 112 (H) 65 - 99 mg/dL   BUN 16 6 - 20 mg/dL   Creatinine, Ser 0.60 (L) 0.61 - 1.24 mg/dL   Calcium 8.7 (L) 8.9 - 10.3 mg/dL   Total Protein 6.0 (L) 6.5 - 8.1 g/dL   Albumin 3.0 (L) 3.5 - 5.0 g/dL   AST 48 (H) 15 - 41 U/L   ALT 23 17 - 63 U/L   Alkaline Phosphatase 47 38 - 126 U/L   Total Bilirubin 1.4 (H) 0.3 - 1.2 mg/dL   GFR calc non Af Amer >  60 >60 mL/min   GFR calc Af Amer >60 >60 mL/min    Comment: (NOTE) The eGFR has been calculated using the CKD EPI equation. This calculation has not been validated in all clinical situations. eGFR's persistently <60 mL/min signify possible Chronic Kidney Disease.    Anion gap 10 5 - 15    Comment: Performed at [facility] [address] [place], [place] [address]  Glucose, capillary     Status: None   Collection Time: 06/27/17  5:23 AM  Result Value Ref Range   Glucose-Capillary 93 65 - 99 mg/dL  Glucose, capillary     Status: Abnormal   Collection Time: 06/27/17  6:56 AM  Result Value Ref Range   Glucose-Capillary 110 (H) 65 - 99 mg/dL     HEENT: normal Cardio: RRR and no murmur Resp: CTA B/L and unlabored GI: BS positive and NT, ND Extremity:  No Edema Skin:   Bruise forearms and elbows Neuro: Confused, Abnormal Sensory difficult to assess due to poor concentration and Abnormal Motor 3/5 on Left sided 5/5 on right side Musc/Skel:  Swelling right elbow  Gen NAD Aphasia- non fluent  Assessment/Plan: 1. Functional deficits secondary toLeft side weaknesssecondary to right temporoparietal infarct  status post TPA with hemorrhagic conversion as well as history of left MCA infarction with residual aphasia   which require 3+ hours per day of interdisciplinary therapy in a comprehensive inpatient rehab setting. Physiatrist is providing close team supervision and 24 hour management of active medical problems listed below. Physiatrist and rehab team continue to assess barriers to discharge/monitor patient progress toward functional and medical goals. FIM: Function - Bathing Position: Wheelchair/chair at sink Body parts bathed by patient: Left arm, Chest, Abdomen, Front perineal area, Buttocks, Right upper leg, Left upper leg, Right lower leg, Left lower leg Body parts bathed by helper: Right arm, Back  Function- Upper Body Dressing/Undressing What is the patient wearing?: Pull over shirt/dress, Button up shirt Pull over shirt/dress - Perfomed by patient: Thread/unthread right sleeve, Put head through opening, Pull shirt over trunk Pull over shirt/dress - Perfomed by helper: Thread/unthread left sleeve Button up shirt - Perfomed by patient: Thread/unthread right sleeve Button up shirt - Perfomed by helper: Thread/unthread left sleeve, Pull shirt around back Function - Lower Body Dressing/Undressing What is the patient wearing?: Socks, Shoes, Pants, Underwear Position: Wheelchair/chair at [employer] - Performed by patient: Thread/unthread right underwear leg, Thread/unthread left underwear leg, Pull underwear up/down Underwear - Performed by helper: Pull underwear up/down Pants- Performed by patient: Thread/unthread right pants leg, Thread/unthread left pants leg, Pull pants up/down Pants- Performed by helper: Pull pants up/down Socks - Performed by helper: Don/doff right sock, Don/doff left sock Shoes - Performed by patient: Don/doff right shoe, Don/doff left shoe(Slip ons) Assist for footwear: Supervision/touching assist Assist for lower body dressing: Touching or steadying assistance  (Pt > 75%)  Function - Toileting Toileting steps completed by patient: Adjust clothing prior to toileting Toileting steps completed by helper: Performs perineal hygiene, Adjust clothing after toileting Toileting Assistive Devices: Grab bar or rail Assist level: Touching or steadying assistance (Pt.75%)  Function - Air cabin crew transfer assistive device: Elevated toilet seat/BSC over toilet Assist level to toilet: Touching or steadying assistance (Pt > 75%) Assist level from toilet: Touching or steadying assistance (Pt > 75%)  Function - Chair/bed transfer Chair/bed transfer method: Stand pivot Chair/bed transfer assist level: Touching or steadying assistance (Pt > 75%) Chair/bed transfer assistive device: Armrests, Walker Chair/bed transfer details:  Verbal cues for precautions/safety, Verbal cues for technique, Verbal cues for sequencing  Function - Locomotion: Wheelchair Will patient use wheelchair at discharge?: No Function - Locomotion: Ambulation Assistive device: Rail in hallway Max distance: 100' Assist level: Touching or steadying assistance (Pt > 75%) Assist level: Touching or steadying assistance (Pt > 75%) Assist level: Touching or steadying assistance (Pt > 75%) Assist level: Touching or steadying assistance (Pt > 75%) Walk 10 feet on uneven surfaces activity did not occur: Safety/medical concerns  Function - Comprehension Comprehension: Auditory Comprehension assist level: Understands basic 50 - 74% of the time/ requires cueing 25 - 49% of the time  Function - Expression Expression: Verbal, Nonverbal Expression assist level: Expresses basis less than 25% of the time/requires cueing >75% of the time.  Function - Social Interaction Social Interaction assist level: Interacts appropriately 25 - 49% of time - Needs frequent redirection.  Function - Problem Solving Problem solving assist level: Solves basic less than 25% of the time - needs direction nearly  all the time or does not effectively solve problems and may need a restraint for safety  Function - Memory Memory assist level: Recognizes or recalls less than 25% of the time/requires cueing greater than 75% of the time Patient normally able to recall (first 3 days only): That he or she is in a hospital  Medical Problem List and Plan: 1.Left side weaknesssecondary to right temporoparietal infarct status post TPA with hemorrhagic conversion as well as history of left MCA infarction with residual aphasia CIR PT, OT, SLP 2. DVT Prophylaxis/Anticoagulation: SCD's only given hemorrhage.Monitor for signs of DVT -Dopplers negative 3. Pain Management:Tylenol as needed 4. Mood:Provide emotional support 5. Neuropsych: This patientiscapable of making decisions on hisown behalf. 6. Skin/Wound Care:Routine skin checks 7. Fluids/Electrolytes/Nutrition:Routine I&O with follow upchemistries 8. Dysphagia.NPO. Cortrakfor nutritional support. Follow-up speech therapy 9.Aspiration pneumonia. Completed course of Unasyn -monitor for recurrent sx, no signs of respiratory distress 10.Hypothyroidism. Synthroid 11.Hyperlipidemia.Lipitor 12.Atrial fibrillation. Cardiac rate controlled. Chronic Coumadin on hold d/t hemorrhage. Start ASA 41m, repeat CT scan later this week to determine anticoag with warfarin timing 13.Tobacco abuse. Counseling 14.  Hypoalbuminemia start pro-stat  LOS (Days) 4 A FACE TO FACE EVALUATION WAS PERFORMED  ACharlett Blake2/26/2019, 9:08 AM

## 2017-06-27 NOTE — Progress Notes (Signed)
Physical Therapy Session Note  Patient Details  Name: Ronald [name] MRN: [id number] Date of Birth: [birth year]  Today's Date: 06/27/2017 PT Individual Time: 0900-1000 PT Individual Time Calculation (min): 60 min   Short Term Goals: Week 1:  PT Short Term Goal 1 (Week 1): Pt will ambulate 150' with LRAD and min assist PT Short Term Goal 2 (Week 1): Pt will negotiate 4 steps with 1 rail and min assist PT Short Term Goal 3 (Week 1): Pt will complete standardized balance assessment.   Skilled Therapeutic Interventions/Progress Updates: Pt presented in bed with nsg present to stop tube feeding. Pt performed supine to sit with min guard and use of features. Performed stand pivot to w/c min guard with cues for safety. Performed w/c propulsion 4 extremities with max cues for use of LUE to rehab gym. In gym pt performed standing balance horse shoe toss for L NMR, noted improved attention to LUE this session. Pt performed toe taps with LLE with max cues for sequencing and technique. Pt then attempted alternating toe taps with max assist for sequencing. Performed stand pivot with minA to w/c and transported to day room. Pt participated in NuStep L1 x 3 min for endurance and reciprocal activity. Pt required mod multimodal cues for maintaining LUE on bar/handle. Performed stand pivot return to w/c with minA and transported to room. Pt indicating need to use toilet pt furniture walked to bathroom with minA for management of IV pole (+void). Pt ambulated to sink and performed hand hygiene with min guard. Pt returned to bed and pt left in bed with 4 rails up, bed alarm on and all needs met.      Therapy Documentation Precautions:  Precautions Precautions: Fall Precaution Comments: NPO  NG tube, left neglect/visual field deficit, global aphasia Restrictions Weight Bearing Restrictions: No General:   Vital Signs:  Pain: Pain Assessment Pain Assessment: No/denies pain Pain Score: 0-No pain Faces Pain  Scale: No hurt   See Function Navigator for Current Functional Status.   Therapy/Group: Individual Therapy  Crew Goren  Creg Gilmer, PTA  06/27/2017, 12:27 PM

## 2017-06-27 NOTE — Progress Notes (Signed)
Speech Language Pathology Daily Session Note  Patient Details  Name: Ronald [name] MRN: [id number] Date of Birth: 1940-06-12  Today's Date: 06/27/2017 SLP Individual Time: 1300-1400 SLP Individual Time Calculation (min): 60 min  Short Term Goals: Week 1: SLP Short Term Goal 1 (Week 1): Pt will consume therapeutic PO trials of ice chips and/or tsp sips of water without overt s/sx of aspiration 100% of the time over 3 sessions. SLP Short Term Goal 2 (Week 1): Pt will communicate wants/needs via multimodal communication including gestures, communication board, and verbal attempts given Max A verbal cues.  SLP Short Term Goal 3 (Week 1): Pt will imitate single-syllable functional words with ~50% intelligibility given Max A model prompts.  SLP Short Term Goal 4 (Week 1): Pt will follow functional 2-step commands within context given Mod A verbal and visual cues.    Skilled Therapeutic Interventions: Skilled ST services focused on swallow and speech skills. SLP facilitated oral care with suction toothbrush, pt required max A demonstration cues for functional problem solving utilizing toothbrush piror ti ice chip trials. SLP facilitated ice chip trials, pt consumed 8 trials and demonstrated overt s/s aspiration on 3 trials with delayed cough and wet vocal quality.Pt demonstrated efficient throat clear cough on 30% of attempts suggested by clear vocal quality. SLP facilitated expressive naming of common objects named objects with 20% accuracy, however speech was severely impaired low vocal intensity, slurred and phonemic errors.Pt demonstrated ability to repeat at word level with Max A verbal/visual cues, however intelligibility is 30% at word level. Pt is inconsistent identifying objects,pictures and matching object to pictures in a field of 2. Pt demonstrated ability to express want/needs requesting to use the bathroom verbally (however poor intelligibility) ,with gestures and confirmed communication message  with yes/no questioning. Pt demonstrated reduce safety awareness and impulsivity when SLP assisted pt to bathroom. Pt was left in room with call bell within reach and nursing staff present. Reccomend to continue skilled ST services.       Function:  Eating Eating   Modified Consistency Diet: Yes(ice chip trials) Eating Assist Level: Supervision or verbal cues;Helper brings food to mouth           Cognition Comprehension Comprehension assist level: Understands basic 50 - 74% of the time/ requires cueing 25 - 49% of the time  Expression   Expression assist level: Expresses basic 25 - 49% of the time/requires cueing 50 - 75% of the time. Uses single words/gestures.  Social Interaction Social Interaction assist level: Interacts appropriately 25 - 49% of time - Needs frequent redirection.  Problem Solving Problem solving assist level: Solves basic less than 25% of the time - needs direction nearly all the time or does not effectively solve problems and may need a restraint for safety  Memory Memory assist level: Recognizes or recalls less than 25% of the time/requires cueing greater than 75% of the time    Pain Pain Assessment Pain Assessment: No/denies pain Pain Score: 0-No pain Faces Pain Scale: No hurt  Therapy/Group: Individual Therapy  Blannie Shedlock  [facility] HospitalCR[facility] 06/27/2017, 2:58 PM

## 2017-06-28 ENCOUNTER — Inpatient Hospital Stay (HOSPITAL_COMMUNITY): Payer: Self-pay | Admitting: Physical Therapy

## 2017-06-28 ENCOUNTER — Inpatient Hospital Stay (HOSPITAL_COMMUNITY): Payer: Self-pay

## 2017-06-28 ENCOUNTER — Inpatient Hospital Stay (HOSPITAL_COMMUNITY): Payer: Self-pay | Admitting: Occupational Therapy

## 2017-06-28 DIAGNOSIS — M25511 Pain in right shoulder: Secondary | ICD-10-CM

## 2017-06-28 DIAGNOSIS — I169 Hypertensive crisis, unspecified: Secondary | ICD-10-CM

## 2017-06-28 DIAGNOSIS — I10 Essential (primary) hypertension: Secondary | ICD-10-CM

## 2017-06-28 DIAGNOSIS — I69322 Dysarthria following cerebral infarction: Secondary | ICD-10-CM

## 2017-06-28 LAB — GLUCOSE, CAPILLARY: GLUCOSE-CAPILLARY: 110 mg/dL — AB (ref 65–99)

## 2017-06-28 MED ORDER — DICLOFENAC SODIUM 1 % TD GEL
2.0000 g | Freq: Four times a day (QID) | TRANSDERMAL | Status: DC
  Administered 2017-06-28 – 2017-07-08 (×38): 2 g via TOPICAL
  Filled 2017-06-28 (×3): qty 100

## 2017-06-28 MED ORDER — LISINOPRIL 5 MG PO TABS
5.0000 mg | ORAL_TABLET | Freq: Every day | ORAL | Status: DC
  Administered 2017-06-28 – 2017-06-30 (×3): 5 mg via ORAL
  Filled 2017-06-28 (×3): qty 1

## 2017-06-28 NOTE — Progress Notes (Signed)
Social Work Patient ID: Ronald [name], male   DOB: 1940-07-20, 77 y.o.   MRN: [id number]  Left message for daughter to discuss team conference goals and target discharge 3/9. Pt made aware and was visiting with friends who were here. Will await daughter return call for questions or concerns.

## 2017-06-28 NOTE — Progress Notes (Signed)
Occupational Therapy Session Note  Patient Details  Name: Ronald [name] MRN: [id number] Date of Birth: 1941-03-12  Today's Date: 06/28/2017 OT Individual Time: 1100-1200 OT Individual Time Calculation (min): 60 min    Short Term Goals: Week 1:  OT Short Term Goal 1 (Week 1): Pt will locate 2 bathing/selfcare items left of midline with no more than mod instructional cueing.  OT Short Term Goal 2 (Week 1): Pt will complete UB bathing with supervision in unsupported sitting. OT Short Term Goal 3 (Week 1): Pt will complete LB bathing sit to stand with supervision.  OT Short Term Goal 4 (Week 1): Pt will perform LB dressing with min assist sit to stand.  OT Short Term Goal 5 (Week 1): Pt will complete toilet transfers to elevated toilet or 3:1 with supervision using RW for support.   Skilled Therapeutic Interventions/Progress Updates:    Pt seen for OT tx session focusing on functional ambulation and use of RW, and functional activity tolerance. Pt in supine upon arrival, agreeable to tx session. Pt able to gesture decline of bathing/dressing this morning. He ambulated throughout session using RW, min A for body control, however, required min-max A for RW management in functional context secondary to [name] inattention/neglect and difficulty following VCs due to global aphasia. He ambulated to ADL apartment and completed functional ambulation in home environment, including walking into bathroom and turning to toilet, as well as transfer from low soft surface couch.  He then completed obstacle course of weaving through cones, on initial trial required total A for RW management and max multimodal cuing. Completed x5 trials with better carry over and performance each trial, last trial completed with min A overall. Seated rest breaks provided btwn trials.  He then ambulated to therapy day room, overall min A for ambulation with better RW management and positioning noted. He completed 8 min on NuStep at level 6,  activity per pt request. Required VCs for awareness to [name] UE initially, however, able to carry over and maintain functional grasp to complete activity with all 4 extremities. Pt ambulated back to room at end of session, max multimodal cuing for navigation and RW management during turns.  Pt left in supine at end of session, bed alarm on and friends present.   Therapy Documentation Precautions:  Precautions Precautions: Fall Precaution Comments: NPO  NG tube, left neglect/visual field deficit, global aphasia Restrictions Weight Bearing Restrictions: No Pain: Pain Assessment Pain Assessment: No/ denies pain  See Function Navigator for Current Functional Status.   Therapy/Group: Individual Therapy  Ronald [name] 06/28/2017, 7:09 AM

## 2017-06-28 NOTE — Progress Notes (Signed)
Social Work   Lannie Heaps, Elveria Rising  Social Worker  Physical Medicine and Rehabilitation  Patient Care Conference  Signed  Date of Service:  06/28/2017 11:57 AM          Signed          [] Hide copied text  [] Hover for details   Inpatient RehabilitationTeam Conference and Plan of Care Update Date: 06/28/2017   Time: 10:30 AM      Patient Name: LOYLE LEVECK      Medical Record Number: [id number]  Date of Birth: 1941-04-17 Sex: Male         Room/Bed: 4W20C/4W20C-01 Payor Info: Payor: MEDICARE / Plan: MEDICARE PART A AND B / Product Type: *No Product type* /     Admitting Diagnosis: CVA  Admit Date/Time:  06/23/2017  3:27 PM Admission Comments: No comment available    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 Dysphagia, oropharyngeal 06/23/2017  . Respiratory failure (HCC)    . Palliative care encounter    . Aspiration pneumonia due to gastric secretions (HCC)    . Atrial fibrillation (HCC)    . 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 as well as history of left MCA infarction with residual aphasia  Improve mobility, cognition, started meds Afib, dysphagia  See above   Bowel/Bladder     cont b&b; lbm 2/27  maintain  assess q shift and prn   Swallow/Nutrition/ Hydration     NPO  Min A  trials if ice chips  prior to MBS (end of week?)   ADL's     Min A functional standing balanace during ADLs, min A functional transfers, VCs UB bathing/dressing and grooming, poor safety awareness  Supervision overall  Safety awareness; L neuro re-ed; L  attention; ADL re-training; functional standing balance/endurance   Mobility     supervision bed mobility, gait HHA, min guard w/c mobility, fair dynamic balance, good sitting balance, decreased safety awareness, decreased endurance  supervision  balance, endurance, safety awareness, family ed   Communication     Max A  Min A  repeating syllable and word level, expressing want/needs via multimodal (guesture, yes/no)   Safety/Cognition/ Behavioral Observations   impulsive; may attempt self-transfer  transfer with min assist  bed alarm; call bell within reach; one assist with transfer   Pain     c/o back pain; tylenol  <4  assess q shift and prn   Skin     scattered bruising  free from infection and further breakdown  assess q shift and prn     *See Care Plan and progress notes for long and short-term goals.      Barriers to Discharge   Current Status/Progress Possible Resolutions Date Resolved   Physician     Medical stability  Dysphagia  See above  Therapies, safety for  aspiration, meds for Afib started      Nursing                 PT  Medical stability                 OT                 SLP            SW Decreased caregiver support Daughter will need to hire caregiver during the day while she works             Discharge Planning/Teaching Needs:  Home to daughter's who she is working on arranging 24 hr care for Dad. Applying for Medicaid this week      Team Discussion:  Goals supervision-min assist level. MBS on Friday better with managing his secretions. Off O2 now. Chronic back pain-MD addressing. Decreased endurance and Left attention. Impulsive and safety issues due to moves quickly. Nursing working on calling when needs to use the bathroom.   Revisions to Treatment Plan:  DC 3/9    Continued Need for Acute Rehabilitation Level of Care: The patient requires daily medical management by a physician with specialized training in physical medicine and rehabilitation for the following conditions: Daily direction of a multidisciplinary physical rehabilitation program to ensure safe treatment while eliciting the highest outcome that is of practical value to the patient.: Yes Daily medical management of patient stability for increased activity during participation in an intensive rehabilitation regime.: Yes Daily analysis of laboratory values and/or radiology reports with any subsequent need for medication adjustment of medical intervention for : Neurological problems;Other;Cardiac problems   Lucy Chris 06/28/2017, 11:58 AM                 Patient ID: Marinus Maw, male   DOB: 1940/10/21, 77 y.o.   MRN: [id number]

## 2017-06-28 NOTE — Progress Notes (Signed)
Subjective/Complaints: Pt seen sitting up in bed this AM.  He appears to indicate that he slept well overnight.  Review of systems: Difficult to obtain secondary to severe dysarthria, appears to suggest right shoulder pain  Objective: Vital Signs: Blood pressure (!) 170/60, pulse 63, temperature 98.3 F (36.8 C), temperature source Oral, resp. rate 17, height '5\' 11"'  (1.803 m), weight 72.2 kg (159 lb 2.8 oz), SpO2 98 %. No results found. Results for orders placed or performed during the hospital encounter of 06/23/17 (from the past 72 hour(s))  Glucose, capillary     Status: Abnormal   Collection Time: 06/26/17  5:28 AM  Result Value Ref Range   Glucose-Capillary 102 (H) 65 - 99 mg/dL  CBC WITH DIFFERENTIAL     Status: Abnormal   Collection Time: 06/26/17  6:01 AM  Result Value Ref Range   WBC 6.4 4.0 - 10.5 K/uL   RBC 3.91 (L) 4.22 - 5.81 MIL/uL   Hemoglobin 13.5 13.0 - 17.0 g/dL   HCT 38.3 (L) 39.0 - 52.0 %   MCV 98.0 78.0 - 100.0 fL   MCH 34.5 (H) 26.0 - 34.0 pg   MCHC 35.2 30.0 - 36.0 g/dL   RDW 13.2 11.5 - 15.5 %   Platelets 211 150 - 400 K/uL   Neutrophils Relative % 73 %   Neutro Abs 4.7 1.7 - 7.7 K/uL   Lymphocytes Relative 15 %   Lymphs Abs 1.0 0.7 - 4.0 K/uL   Monocytes Relative 8 %   Monocytes Absolute 0.5 0.1 - 1.0 K/uL   Eosinophils Relative 4 %   Eosinophils Absolute 0.3 0.0 - 0.7 K/uL   Basophils Relative 0 %   Basophils Absolute 0.0 0.0 - 0.1 K/uL    Comment: Performed at [facility], [address], [place], [place] [address]  Comprehensive metabolic panel     Status: Abnormal   Collection Time: 06/26/17  6:01 AM  Result Value Ref Range   Sodium 137 135 - 145 mmol/L   Potassium 3.6 3.5 - 5.1 mmol/L   Chloride 103 101 - 111 mmol/L   CO2 24 22 - 32 mmol/L   Glucose, Bld 112 (H) 65 - 99 mg/dL   BUN 16 6 - 20 mg/dL   Creatinine, Ser 0.60 (L) 0.61 - 1.24 mg/dL   Calcium 8.7 (L) 8.9 - 10.3 mg/dL   Total Protein 6.0 (L) 6.5 - 8.1 g/dL   Albumin 3.0  (L) 3.5 - 5.0 g/dL   AST 48 (H) 15 - 41 U/L   ALT 23 17 - 63 U/L   Alkaline Phosphatase 47 38 - 126 U/L   Total Bilirubin 1.4 (H) 0.3 - 1.2 mg/dL   GFR calc non Af Amer >60 >60 mL/min   GFR calc Af Amer >60 >60 mL/min    Comment: (NOTE) The eGFR has been calculated using the CKD EPI equation. This calculation has not been validated in all clinical situations. eGFR's persistently <60 mL/min signify possible Chronic Kidney Disease.    Anion gap 10 5 - 15    Comment: Performed at [facility] [address] [place], [place] [address]  Glucose, capillary     Status: None   Collection Time: 06/27/17  5:23 AM  Result Value Ref Range   Glucose-Capillary 93 65 - 99 mg/dL  Glucose, capillary     Status: Abnormal   Collection Time: 06/27/17  6:56 AM  Result Value Ref Range   Glucose-Capillary 110 (H) 65 -  99 mg/dL  Glucose, capillary     Status: Abnormal   Collection Time: 06/28/17  6:27 AM  Result Value Ref Range   Glucose-Capillary 110 (H) 65 - 99 mg/dL     HEENT: Normocephalic, atraumatic. +NG Cardio: irregularly irregular and no JVD. Resp: CTA B/L and unlabored GI: BS positive and ND Skin:   Scattered bruises Neuro: Limited due to severe dysarthria and aphasia Motor:  ?3/5 on Left sided 5/5 on right side Musc/Skel:  No edema. No tenderness.  No pain with PROM right shoulder Gen NAD. Vital signs reviewed  Assessment/Plan: 1. 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 Physiatrist is providing close team supervision and 24 hour management of active medical problems listed below. Physiatrist and rehab team continue to assess barriers to discharge/monitor patient progress toward functional and medical  goals. FIM: Function - Bathing Position: Shower Body parts bathed by patient: Left arm, Chest, Abdomen, Front perineal area, Buttocks, Right upper leg, Left upper leg, Right lower leg, Left lower leg, Right arm Body parts bathed by helper: Back Assist Level: Touching or steadying assistance(Pt > 75%)  Function- Upper Body Dressing/Undressing What is the patient wearing?: Pull over shirt/dress Pull over shirt/dress - Perfomed by patient: Thread/unthread right sleeve, Put head through opening, Pull shirt over trunk, Thread/unthread left sleeve Pull over shirt/dress - Perfomed by helper: Thread/unthread left sleeve Button up shirt - Perfomed by patient: Thread/unthread right sleeve Button up shirt - Perfomed by helper: Thread/unthread left sleeve, Pull shirt around back Assist Level: Supervision or verbal cues Function - Lower Body Dressing/Undressing What is the patient wearing?: Socks, Shoes, Pants, Underwear Position: Wheelchair/chair at [employer] - Performed by patient: Thread/unthread right underwear leg, Thread/unthread left underwear leg, Pull underwear up/down Underwear - Performed by helper: Pull underwear up/down Pants- Performed by patient: Thread/unthread right pants leg, Thread/unthread left pants leg, Pull pants up/down Pants- Performed by helper: Pull pants up/down Socks - Performed by helper: Don/doff right sock, Don/doff left sock Shoes - Performed by patient: Don/doff right shoe, Don/doff left shoe(Slip ons) Assist for footwear: Supervision/touching assist Assist for lower body dressing: Touching or steadying assistance (Pt > 75%)  Function - Toileting Toileting steps completed by patient: Adjust clothing prior to toileting, Performs perineal hygiene Toileting steps completed by helper: Adjust clothing after toileting Toileting Assistive Devices: Grab bar or rail Assist level: Touching or steadying assistance (Pt.75%)  Function - Air cabin crew transfer  assistive device: Elevated toilet seat/BSC over toilet Assist level to toilet: Touching or steadying assistance (Pt > 75%) Assist level from toilet: Touching or steadying assistance (Pt > 75%)  Function - Chair/bed transfer Chair/bed transfer method: Stand pivot, Ambulatory Chair/bed transfer assist level: Touching or steadying assistance (Pt > 75%) Chair/bed transfer assistive device: Armrests, Walker Chair/bed transfer details: Verbal cues for precautions/safety, Verbal cues for technique, Verbal cues for sequencing  Function - Locomotion: Wheelchair Will patient use wheelchair at discharge?: No Function - Locomotion: Ambulation Assistive device: Walker-rolling Max distance: 261f  Assist level: Touching or steadying assistance (Pt > 75%) Assist level: Touching or steadying assistance (Pt > 75%) Assist level: Touching or steadying assistance (Pt > 75%) Assist level: Touching or steadying assistance (Pt > 75%) Walk 10 feet on uneven surfaces activity did not occur: Safety/medical concerns Assist level: Touching or steadying  assistance (Pt > 75%)  Function - Comprehension Comprehension: Auditory Comprehension assist level: Understands basic 50 - 74% of the time/ requires cueing 25 - 49% of the time  Function - Expression Expression: Verbal, Nonverbal Expression assist level: Expresses basic 25 - 49% of the time/requires cueing 50 - 75% of the time. Uses single words/gestures.  Function - Social Interaction Social Interaction assist level: Interacts appropriately 25 - 49% of time - Needs frequent redirection.  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 DVT Prophylaxis/Anticoagulation: SCD's only given hemorrhage.Monitor for signs of DVT -Dopplers negative 3. Pain Management:Tylenol as needed   Voltaren gel ordered for right shoulder 4. Mood:Provide emotional support 5. Neuropsych: This patientiscapable of making decisions on hisown behalf. 6. Skin/Wound Care:Routine skin checks 7. Fluids/Electrolytes/Nutrition:Routine I&Os   BMP within acceptable range on 2/25 8. Dysphagia.NPO. Cortrakfor nutritional support. Follow-up speech therapy 9.Aspiration pneumonia. Completed course of Unasyn -monitor for recurrent sx, no signs of respiratory distress 10.Hypothyroidism. Synthroid 11.Hyperlipidemia.Lipitor 12.Atrial fibrillation. Cardiac rate controlled. Chronic Coumadin on hold d/t hemorrhage. Start ASA 72m, repeat CT scan later this week to determine anticoag with warfarin timing 13.Tobacco abuse. Counseling 14.  Hypoalbuminemia start pro-stat 15. HTN   Hypertensive crisis overnight   Lisinopril 5 started on 2/27 16. Aspiration PNA   Cont IV abx, to be completed today  LOS (Days) 5 A FACE TO FACE EVALUATION WAS PERFORMED  Ankit ALorie Phenix2/27/2019, 12:35 PM

## 2017-06-28 NOTE — Patient Care Conference (Signed)
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 Dysphagia, oropharyngeal 06/23/2017  . Respiratory failure (HCC)   . Palliative care encounter   . Aspiration pneumonia due to gastric secretions (HCC)   . Atrial fibrillation (HCC)   . 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 as well as history of left MCA infarction with residual aphasia  Improve mobility, cognition, started meds Afib, dysphagia  See above   Bowel/Bladder   cont b&b; lbm 2/27  maintain  assess q shift and prn   Swallow/Nutrition/ Hydration   NPO  Min A  trials if ice chips prior to MBS (end of week?)   ADL's   Min A functional standing balanace during ADLs, min A functional transfers, VCs UB bathing/dressing and grooming, poor safety awareness  Supervision overall  Safety awareness; L neuro re-ed; L  attention; ADL re-training; functional  standing balance/endurance   Mobility   supervision bed mobility, gait HHA, min guard w/c mobility, fair dynamic balance, good sitting balance, decreased safety awareness, decreased endurance  supervision  balance, endurance, safety awareness, family ed   Communication   Max A  Min A  repeating syllable and word level, expressing want/needs via multimodal (guesture, yes/no)   Safety/Cognition/ Behavioral Observations  impulsive; may attempt self-transfer  transfer with min assist  bed alarm; call bell within reach; one assist with transfer   Pain   c/o back pain; tylenol  <4  assess q shift and prn   Skin   scattered bruising  free from infection and further breakdown  assess q shift and prn      *See Care Plan and progress notes for long and short-term goals.     Barriers to Discharge  Current Status/Progress Possible Resolutions Date Resolved   Physician    Medical stability  Dysphagia  See above  Therapies, safety for aspiration, meds for Afib started      Nursing                  PT  Medical stability                 OT                  SLP                SW Decreased caregiver support Daughter will need to hire caregiver during the day while  she works            Discharge Planning/Teaching Needs:  Home to daughter's who she is working on arranging 24 hr care for Dad. Applying for Medicaid this week      Team Discussion:  Goals supervision-min assist level. MBS on Friday better with managing his secretions. Off O2 now. Chronic back pain-MD addressing. Decreased endurance and Left attention. Impulsive and safety issues due to moves quickly. Nursing working on calling when needs to use the bathroom.  Revisions to Treatment Plan:  DC 3/9    Continued Need for Acute Rehabilitation Level of Care: The patient requires daily medical management by a physician with specialized training in physical medicine and rehabilitation for the following conditions: Daily direction of a  multidisciplinary physical rehabilitation program to ensure safe treatment while eliciting the highest outcome that is of practical value to the patient.: Yes Daily medical management of patient stability for increased activity during participation in an intensive rehabilitation regime.: Yes Daily analysis of laboratory values and/or radiology reports with any subsequent need for medication adjustment of medical intervention for : Neurological problems;Other;Cardiac problems  Lucy Chris 06/28/2017, 11:58 AM

## 2017-06-28 NOTE — Progress Notes (Signed)
Physical Therapy Session Note  Patient Details  Name: Ronald [name] MRN: [id number] Date of Birth: 02/01/1941  Today's Date: 06/28/2017 PT Individual Time: 0805-0900  AND 1605-1640 PT Individual Time Calculation (min): 55 min   Short Term Goals: Week 1:  PT Short Term Goal 1 (Week 1): Pt will ambulate 150' with LRAD and min assist PT Short Term Goal 2 (Week 1): Pt will negotiate 4 steps with 1 rail and min assist PT Short Term Goal 3 (Week 1): Pt will complete standardized balance assessment.   Skilled Therapeutic Interventions/Progress Updates:   Pt received supine in bed and agreeable to PT. Supine>sit transfer with supevrision assist and min cues. Stand pivot to [facility] with Min assist and RW. Pt requests need to BM. Min assist ambulatory transfer to toilet. Clothing management and changing due to mild incontinence with set up assist from PT.  Gait training with RW x 16f, [id number] [id number] Level and x 15ft46fr unlevel surface with min assist and RW. Min-mod cues for AD management and awareness of the L. Car transfer min assist with RW and cues for safety.  Nustep reciprocal movement training x 8 min with L hand splint to improve awareness of the L UE. Pt returned to room and performed ambulatory transfer to bed with min assist and RW. Sit>supine completed with supervision assist and left supine in bed with call bell in reach and all needs met.   Session 2.   Pt received sitting on mat table in rehab gym following gait with RN, pt agreeable to PT.   dynanmic gait training with RW ot weave through 6 cones x 2 with min assist and moderate cues for improved visual scanning to attend to obstacles on the L. Gait training completed without AD 150ft,74fft, 80f180ft. W3fmin assist. Mild improvements in safety without AD due to impulsivity with AD to navigate obstacles on the L. Nustep reciprocal movement training x 5 min, Pt able to progress to no L UE hand splint and demonstrated increased  awareness of the LUE .   Pt returned to room and performed ambulatory transfer to bed with min assist. Sit>supine completed with supervision assist, and pt left supine in bed with call bell in reach and all needs met.       Therapy Documentation Precautions:  Precautions Precautions: Fall Precaution Comments: NPO  NG tube, left neglect/visual field deficit, global aphasia Restrictions Weight Bearing Restrictions: No General:   Vital Signs: Therapy Vitals Pulse Rate: 63 Resp: 17 Patient Position (if appropriate): Lying Oxygen Therapy SpO2: 98 % O2 Device: Room Air Pain: Pain Assessment Pain Assessment: Faces Faces Pain Scale: Hurts little more Pain Type: Chronic pain Pain Location: Back Mobility:   Locomotion :    Trunk/Postural Assessment :    Balance:   Exercises:   Other Treatments:     See Function Navigator for Current Functional Status.   Therapy/Group: Individual Therapy  [name] ELorie Phenix19, 9:04 AM

## 2017-06-28 NOTE — Progress Notes (Signed)
Speech Language Pathology Daily Session Note  Patient Details  Name: Ronald [name] MRN: [id number] Date of Birth: 1941/04/14  Today's Date: 06/28/2017 SLP Individual Time: 1300-1400 SLP Individual Time Calculation (min): 60 min  Short Term Goals: Week 1: SLP Short Term Goal 1 (Week 1): Pt will consume therapeutic PO trials of ice chips and/or tsp sips of water without overt s/sx of aspiration 100% of the time over 3 sessions. SLP Short Term Goal 2 (Week 1): Pt will communicate wants/needs via multimodal communication including gestures, communication board, and verbal attempts given Max A verbal cues.  SLP Short Term Goal 3 (Week 1): Pt will imitate single-syllable functional words with ~50% intelligibility given Max A model prompts.  SLP Short Term Goal 4 (Week 1): Pt will follow functional 2-step commands within context given Mod A verbal and visual cues.   Skilled Therapeutic Interventions:Skilled ST services focused on swallow and cognitive skills. SLP facilitated oral care via suction toothbrush and provided min A verbal cues prior to trials of ice chips. Pt demonstrated 30% wet vocal quality and utilzing double swallow on 10 trials of ice chips no other overt s/s aspiration notes and pt demonstrated intermittent wet vocal quality on own secretions. Pt required max A verbal/visual cues to return demonstration of voitional cough/throat clear. Pt demonstrated severe oral holding and swallow initiation during ice chip trails, however daughter stated mild-moderate oral holding a baseline. SLP facilitated response to yes/no questions pertaining to personal/immediate environment 80% accuracy, identifying common items 80% accuracy and identifying action given picture cards with 60% accuracy. SLP facilitated 1 step body movement directions with 70% accuracy, however inconsistent with left/right directions. Pt was left in room with call bell within reach. Reccomend to continue skilled ST services.       Function:  Eating Eating   Modified Consistency Diet: Yes(ice chips) Eating Assist Level: Supervision or verbal cues;Helper scoops food on utensil           Cognition Comprehension Comprehension assist level: Understands basic 50 - 74% of the time/ requires cueing 25 - 49% of the time  Expression   Expression assist level: Expresses basic 25 - 49% of the time/requires cueing 50 - 75% of the time. Uses single words/gestures.  Social Interaction Social Interaction assist level: Interacts appropriately 25 - 49% of time - Needs frequent redirection.  Problem Solving Problem solving assist level: Solves basic less than 25% of the time - needs direction nearly all the time or does not effectively solve problems and may need a restraint for safety  Memory Memory assist level: Recognizes or recalls less than 25% of the time/requires cueing greater than 75% of the time    Pain Pain Assessment Pain Assessment: No/denies pain  Therapy/Group: Individual Therapy  Sharice Harriss  [facility] 06/28/2017, 2:14 PM

## 2017-06-28 NOTE — Progress Notes (Signed)
Pt in therapy at this time, unable to complete CPT as ordered

## 2017-06-29 ENCOUNTER — Inpatient Hospital Stay (HOSPITAL_COMMUNITY): Payer: Self-pay | Admitting: Speech Pathology

## 2017-06-29 ENCOUNTER — Inpatient Hospital Stay (HOSPITAL_COMMUNITY): Payer: Medicare Other

## 2017-06-29 ENCOUNTER — Encounter (HOSPITAL_COMMUNITY): Payer: Self-pay | Admitting: Speech Pathology

## 2017-06-29 ENCOUNTER — Inpatient Hospital Stay (HOSPITAL_COMMUNITY): Payer: Self-pay | Admitting: Occupational Therapy

## 2017-06-29 ENCOUNTER — Inpatient Hospital Stay (HOSPITAL_COMMUNITY): Payer: Self-pay | Admitting: Physical Therapy

## 2017-06-29 LAB — GLUCOSE, CAPILLARY: Glucose-Capillary: 122 mg/dL — ABNORMAL HIGH (ref 65–99)

## 2017-06-29 MED ORDER — MAGIC MOUTHWASH
5.0000 mL | Freq: Four times a day (QID) | ORAL | Status: DC
  Administered 2017-06-29 – 2017-07-08 (×35): 5 mL via ORAL
  Filled 2017-06-29 (×34): qty 5

## 2017-06-29 MED ORDER — FLUCONAZOLE 100 MG PO TABS
200.0000 mg | ORAL_TABLET | Freq: Once | ORAL | Status: AC
  Administered 2017-06-29: 200 mg via ORAL
  Filled 2017-06-29: qty 2

## 2017-06-29 MED ORDER — FLUCONAZOLE 100 MG PO TABS
100.0000 mg | ORAL_TABLET | Freq: Every day | ORAL | Status: DC
  Administered 2017-06-30 – 2017-07-03 (×4): 100 mg via ORAL
  Filled 2017-06-29 (×4): qty 1

## 2017-06-29 NOTE — Progress Notes (Signed)
Occupational Therapy Session Note  Patient Details  Name: Ronald [name] MRN: [id number] Date of Birth: 09-Nov-1940  Today's Date: 06/29/2017 OT Individual Time: 1300-1400 OT Individual Time Calculation (min): 60 min    Short Term Goals: Week 1:  OT Short Term Goal 1 (Week 1): Pt will locate 2 bathing/selfcare items left of midline with no more than mod instructional cueing.  OT Short Term Goal 2 (Week 1): Pt will complete UB bathing with supervision in unsupported sitting. OT Short Term Goal 3 (Week 1): Pt will complete LB bathing sit to stand with supervision.  OT Short Term Goal 4 (Week 1): Pt will perform LB dressing with min assist sit to stand.  OT Short Term Goal 5 (Week 1): Pt will complete toilet transfers to elevated toilet or 3:1 with supervision using RW for support.   Skilled Therapeutic Interventions/Progress Updates:     Pt presented supine in bed, no c/o pain and agreeable to OT tx session. Pt completing room level functional mobility at RW level, ambulating to w/c and to bathroom with MinA. Pt does require multimodal cues for locating seated surfaces due to L neglect/field cut. Pt completing toileting with min steadying assist during clothing management; completing LB dressing while seated on toilet with supervision for footwear and MinA to steady while pt advances pants/underwear over hips. Pt returns to w/c in manner described above, completing UB dressing with increased time and overall supervision. Pt completing grooming ADLs seated in w/c at sink. [facility] L neglect/visual deficits. Pt engaging in 4 1-min trials of activity A, initially completing only in LUQ and LLQ, progressing to L and R quadrants. Pt requiring increased time and mod verbal cues to locate stimulus, with noted increased difficulty scanning and locating stimulus in LLQ. Pt returned to room in manner described above where pt was left seated in w/c, NT and daughter  present, needs within reach.   Therapy Documentation Precautions:  Precautions Precautions: Fall Precaution Comments: NPO  NG tube, left neglect/visual field deficit, global aphasia Restrictions Weight Bearing Restrictions: No    See Function Navigator for Current Functional Status.   Therapy/Group: Individual Therapy  Ronald [name] 06/29/2017, 4:39 PM

## 2017-06-29 NOTE — H&P (Signed)
Chief Complaint: Dysphagia secondary to CVA  Referring Physician(s): Claudette Laws  Supervising Physician: Irish Lack  Patient Status: [facility] - In-pt  History of Present Illness: Ronald [name] is a 77 y.o. male with right temporoparietal infarct, status post TPA with hemorrhagic conversion as well as history of left MCA infarction with residual aphasia.  We are asked to evaluate for gastrostomy placement.  His daughter is at his bedside.  He currently has a Dobhoff/Panda in place.  CT scan showed normal anatomy amenable to percutaneous gastrostomy, however on exam, his oral cavity is caked with white and black/grey residue. Unsure if this is poor oral hygiene or thrush.  Past Medical History:  Diagnosis Date  . COPD (chronic obstructive pulmonary disease) (HCC)   . Emphysema lung (HCC)   . Hx of long term use of blood thinners   . Hypertension   . Stroke [facility]) 2013  . Thyroid disease    hypothyroidism    Past Surgical History:  Procedure Laterality Date  . IR ANGIO EXTRACRAN SEL COM CAROTID INNOMINATE UNI R MOD SED  06/19/2017  . IR ANGIO VERTEBRAL SEL SUBCLAVIAN INNOMINATE UNI R MOD SED  06/19/2017  . RADIOLOGY WITH ANESTHESIA N/A 06/19/2017   Procedure: RADIOLOGY WITH ANESTHESIA;  Surgeon: Julieanne Cotton, MD;  Location: [facility] OR;  Service: Radiology;  Laterality: N/A;    Allergies: Patient has no known allergies.  Medications: Prior to Admission medications   Medication Sig Start Date End Date Taking? Authorizing Provider  Aspirin-Salicylamide-Caffeine (ARTHRITIS STRENGTH BC POWDER PO) Take 1 packet by mouth every hour.   Yes [provider]  gabapentin (NEURONTIN) 100 MG capsule Take 100 mg by mouth 3 (three) times daily as needed (back pain/spasms).  05/19/17  Yes [provider]  levothyroxine (SYNTHROID, LEVOTHROID) 125 MCG tablet Take 125 mcg by mouth daily after supper.  05/19/17  Yes [provider]  metoprolol tartrate  (LOPRESSOR) 50 MG tablet Take 50 mg by mouth daily after supper.  05/19/17  Yes [provider]  warfarin (COUMADIN) 5 MG tablet Take 5 mg by mouth daily after supper.  05/19/17  Yes [provider]     No family history on file.  Social History   Socioeconomic History  . Marital status: Single    Spouse name: None  . Number of children: None  . Years of education: None  . Highest education level: None  Social Needs  . Financial resource strain: None  . Food insecurity - worry: None  . Food insecurity - inability: None  . Transportation needs - medical: None  . Transportation needs - non-medical: None  Occupational History  . None  Tobacco Use  . Smoking status: Current Every Day Smoker    Packs/day: 1.00    Types: Cigarettes  . Smokeless tobacco: Current User  Substance and Sexual Activity  . Alcohol use: Yes    Comment: Drinks 2 or 3 beers per day  . Drug use: No  . Sexual activity: None  Other Topics Concern  . None  Social History Narrative  . None    Review of Systems: A 12 point ROS discussed and pertinent positives are indicated in the HPI above.  All other systems are negative.  Review of Systems  Vital Signs: BP (!) 144/71 (BP Location: Right Arm)   Pulse 69   Temp 98.3 F (36.8 C) (Oral)   Resp 18   Ht 5\' 11"  (1.803 m)   Wt 159 lb 2.8 oz (72.2 kg)  SpO2 99%   BMI 22.20 kg/m   Physical Exam  Constitutional: He is oriented to person, place, and time. He appears well-developed.  HENT:  Oral cavity caked with white, grey, and black residue. Possibly thrush.  Eyes: EOM are normal.  Neck: Normal range of motion.  Cardiovascular: Normal rate and regular rhythm.  Pulmonary/Chest: Effort normal.  Coarse breath sounds bilaterally  Abdominal: Soft. He exhibits no distension. There is no tenderness.  Musculoskeletal: Normal range of motion.  Neurological: He is alert and oriented to person, place, and time.  Skin: Skin is warm and dry.    Psychiatric: He has a normal mood and affect. His behavior is normal. Judgment and thought content normal.  Vitals reviewed.   Imaging: Ct Abdomen Wo Contrast  Result Date: 06/29/2017 CLINICAL DATA:  Patient with dysphagia.  Evaluate for G-tube. EXAM: CT ABDOMEN WITHOUT CONTRAST TECHNIQUE: Multidetector CT imaging of the abdomen was performed following the standard protocol without IV contrast. COMPARISON:  CT abdomen 09/05/2005 FINDINGS: Lower chest: Heart is enlarged. Small left pleural effusion. Ground-glass and consolidative opacities left lower lobe. Atelectasis right lower lobe. Hepatobiliary: Liver is low in attenuation compatible with steatosis. Stones within the gallbladder lumen. No intrahepatic or extrahepatic biliary ductal dilatation. Pancreas: Unremarkable Spleen: Unremarkable Adrenals/Urinary Tract: Normal adrenal glands. Kidneys are symmetric in size. No hydronephrosis. Stomach/Bowel: Enteric tube is demonstrated coursing to the third portion of the duodenum. Oral contrast material within the small bowel. Normal morphology of the stomach. 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 No retroperitoneal adenopathy. Peripheral calcified atherosclerotic plaque of the abdominal aorta. Other: None. Musculoskeletal: Thoracic and lumbar spine degenerative changes. No aggressive or acute appearing osseous lesions. IMPRESSION: 1. No acute process within the abdomen. 2. Small left pleural effusion and underlying opacities favored to represent atelectasis. Infection not excluded. Electronically Signed   By: Annia Belt M.D.   On: 06/29/2017 13:11   Ct Angio Head W Or Wo Contrast  Result Date: 06/19/2017 CLINICAL DATA:  Code stroke presentation with left-sided weakness. EXAM: CT ANGIOGRAPHY HEAD AND NECK TECHNIQUE: Multidetector CT imaging of the head and neck was performed using the standard protocol during bolus administration of intravenous contrast. Multiplanar CT  image reconstructions and MIPs were obtained to evaluate the vascular anatomy. Carotid stenosis measurements (when applicable) are obtained utilizing NASCET criteria, using the distal internal carotid diameter as the denominator. CONTRAST:  50 cc Isovue 370 COMPARISON:  Head CT earlier same day FINDINGS: CTA NECK FINDINGS Aortic arch: Aortic atherosclerosis but no aneurysm or dissection. Branching pattern of the brachiocephalic vessels from the arch is normal without origin stenosis. Right carotid system: Common carotid artery widely patent to the bifurcation region. Atherosclerotic plaque at the carotid bifurcation and proximal ICA. Minimal diameter in the ICA bulb is 4.5 mm. Compared to a more distal cervical ICA diameter 4.5 mm, there is no stenosis. Left carotid system: Common carotid artery widely patent to the bifurcation. Calcified plaque at the carotid bifurcation and ICA bulb. Minimal diameter of the ICA bulb measures 3.2 mm. Compared to a more distal cervical ICA diameter of 4.5 mm, this indicates a 30-40% stenosis. Vertebral arteries: Right vertebral artery is dominant. Atherosclerotic calcification at the right vertebral artery origin with stenosis of 50%. Beyond that, the vessel is widely patent through the cervical region. Non dominant left vertebral artery shows calcified plaque at its origin with 50% stenosis. Beyond that, the vessel is patent through the cervical region to the foramen magnum. Skeleton: Degenerative spondylosis.  No  bony canal stenosis. Other neck: No mass or lymphadenopathy. Upper chest: Mild emphysema and scarring. Review of the MIP images confirms the above findings CTA HEAD FINDINGS Anterior circulation: Both internal carotid arteries are patent through the skull base and siphon regions. There is siphon atherosclerotic calcification maximal stenosis estimated at 30%. The A1 and M1 segments are patent on both sides. There is moderate stenosis in the right A1 segment. No M2  occlusion seen on the left. On the right, there appears to be occlusion of an anterior temporal M2 branch. There are serial stenoses in the right anterior cerebral artery. Posterior circulation: Dominant right vertebral artery shows 30% stenosis in the V4 segment but is patent to the basilar. Non dominant left vertebral artery shows 50% stenosis in the V4 segment but is patent to the basilar. No basilar stenosis. The basilar artery does show some atherosclerotic irregularity. Superior cerebellar and posterior cerebral arteries show flow. Patent posterior communicating artery on the right. Venous sinuses: Patent and normal. Limited opacification due to early face scanning. Anatomic variants: None significant. Delayed phase: No abnormal enhancement. Review of the MIP images confirms the above findings IMPRESSION: Diffuse intracranial atherosclerotic disease. Apparent occlusion of a right insular/anterior temporal M2 branch. Serial severe stenoses in the right anterior cerebral artery. Atherosclerotic irregularity in both A1 segments. Atherosclerotic irregularity of the basilar artery without flow limiting stenosis. Aortic atherosclerosis. Carotid bifurcation atherosclerosis without stenosis. 50% vertebral artery origin stenoses. 30-50% V4 stenoses. Electronically Signed   By: Paulina Fusi M.D.   On: 06/19/2017 20:01   Ct Head Wo Contrast  Result Date: 06/22/2017 CLINICAL DATA:  Follow-up stroke. EXAM: CT HEAD WITHOUT CONTRAST TECHNIQUE: Contiguous axial images were obtained from the base of the skull through the vertex without intravenous contrast. COMPARISON:  CT HEAD June 19, 2017 and MRI of the head June 20, 2016 FINDINGS: BRAIN: Evolving 3 x 1.9 cm RIGHT temporal acute hematoma with surrounding vasogenic and cytotoxic edema. Wedge-like cytotoxic edema RIGHT occipital lobe extending to RIGHT parietal lobe and RIGHT periatrial white matter. Focal cyst cytotoxic edema to lesser extent RIGHT frontal lobe  including insula, RIGHT basal ganglia. Old LEFT frontal lobe infarct. No midline shift. No parenchymal brain volume loss for age. No hydrocephalus. Patchy white matter changes exclusive a aforementioned abnormality compatible with moderate chronic small vessel ischemic disease. No abnormal extra-axial fluid collections. Basal cisterns are patent. VASCULAR: Mild calcific atherosclerosis of the carotid siphons. SKULL: No skull fracture. No significant scalp soft tissue swelling. SINUSES/ORBITS: Mild paranasal sinus mucosal thickening with small RIGHT maxillary sinus air-fluid level. Mastoid air cells are well aerated.The included ocular globes and orbital contents are non-suspicious. OTHER: Patient is edentulous. IMPRESSION: 1. Evolving multifocal acute RIGHT MCA territory infarct with evolving focal hemorrhagic conversion RIGHT temporal lobe. 2. Evolving nonhemorrhagic RIGHT PCA territory infarct. 3. Old LEFT frontal lobe/MCA territory infarct. Electronically Signed   By: Awilda Metro M.D.   On: 06/22/2017 04:29   Ct Angio Neck W Or Wo Contrast  Result Date: 06/19/2017 CLINICAL DATA:  Code stroke presentation with left-sided weakness. EXAM: CT ANGIOGRAPHY HEAD AND NECK TECHNIQUE: Multidetector CT imaging of the head and neck was performed using the standard protocol during bolus administration of intravenous contrast. Multiplanar CT image reconstructions and MIPs were obtained to evaluate the vascular anatomy. Carotid stenosis measurements (when applicable) are obtained utilizing NASCET criteria, using the distal internal carotid diameter as the denominator. CONTRAST:  50 cc Isovue 370 COMPARISON:  Head CT earlier same day FINDINGS: CTA NECK FINDINGS  Aortic arch: Aortic atherosclerosis but no aneurysm or dissection. Branching pattern of the brachiocephalic vessels from the arch is normal without origin stenosis. Right carotid system: Common carotid artery widely patent to the bifurcation region.  Atherosclerotic plaque at the carotid bifurcation and proximal ICA. Minimal diameter in the ICA bulb is 4.5 mm. Compared to a more distal cervical ICA diameter 4.5 mm, there is no stenosis. Left carotid system: Common carotid artery widely patent to the bifurcation. Calcified plaque at the carotid bifurcation and ICA bulb. Minimal diameter of the ICA bulb measures 3.2 mm. Compared to a more distal cervical ICA diameter of 4.5 mm, this indicates a 30-40% stenosis. Vertebral arteries: Right vertebral artery is dominant. Atherosclerotic calcification at the right vertebral artery origin with stenosis of 50%. Beyond that, the vessel is widely patent through the cervical region. Non dominant left vertebral artery shows calcified plaque at its origin with 50% stenosis. Beyond that, the vessel is patent through the cervical region to the foramen magnum. Skeleton: Degenerative spondylosis.  No bony canal stenosis. Other neck: No mass or lymphadenopathy. Upper chest: Mild emphysema and scarring. Review of the MIP images confirms the above findings CTA HEAD FINDINGS Anterior circulation: Both internal carotid arteries are patent through the skull base and siphon regions. There is siphon atherosclerotic calcification maximal stenosis estimated at 30%. The A1 and M1 segments are patent on both sides. There is moderate stenosis in the right A1 segment. No M2 occlusion seen on the left. On the right, there appears to be occlusion of an anterior temporal M2 branch. There are serial stenoses in the right anterior cerebral artery. Posterior circulation: Dominant right vertebral artery shows 30% stenosis in the V4 segment but is patent to the basilar. Non dominant left vertebral artery shows 50% stenosis in the V4 segment but is patent to the basilar. No basilar stenosis. The basilar artery does show some atherosclerotic irregularity. Superior cerebellar and posterior cerebral arteries show flow. Patent posterior communicating artery  on the right. Venous sinuses: Patent and normal. Limited opacification due to early face scanning. Anatomic variants: None significant. Delayed phase: No abnormal enhancement. Review of the MIP images confirms the above findings IMPRESSION: Diffuse intracranial atherosclerotic disease. Apparent occlusion of a right insular/anterior temporal M2 branch. Serial severe stenoses in the right anterior cerebral artery. Atherosclerotic irregularity in both A1 segments. Atherosclerotic irregularity of the basilar artery without flow limiting stenosis. Aortic atherosclerosis. Carotid bifurcation atherosclerosis without stenosis. 50% vertebral artery origin stenoses. 30-50% V4 stenoses. Electronically Signed   By: Paulina Fusi M.D.   On: 06/19/2017 20:01   Mr Brain Wo Contrast  Addendum Date: 06/20/2017   ADDENDUM REPORT: 06/20/2017 18:38 ADDENDUM: Critical Value/emergent results were called by telephone at the time of interpretation on 06/20/2017 at 6:38 pm to Dr. Wilford Corner, who verbally acknowledged these results. Electronically Signed   By: Mitzi Hansen M.D.   On: 06/20/2017 18:38   Result Date: 06/20/2017 CLINICAL DATA:  77 y/o M; History of left MCA stroke presenting with left-sided weakness. EXAM: MRI HEAD WITHOUT CONTRAST TECHNIQUE: Multiplanar, multiecho pulse sequences of the brain and surrounding structures were obtained without intravenous contrast. COMPARISON:  06/19/2017 CT of the head, CTA head, and CT perfusion head. FINDINGS: Brain: Large region of reduced diffusion compatible with acute/early subacute infarction involving the right occipital lobe, temporal lobe, and to lesser degree lateral frontal parietal lobes with small foci of infarction in the right basal ganglia. Acute hemorrhage within the right lateral temporal lobe measuring 2.3 x 3.2 x  1.2 cm (volume = 4.6 cm^3)(series 7, image 11 series 10, image 12). Edema associated with the hemorrhage and stroke results in local mass effect with  partial effacement of temporal horn of right lateral ventricle and minimal right-to-left midline shift. No herniation. Chronic hemosiderin stained infarction involving left lateral frontal lobe and insula. Background of moderate chronic microvascular ischemic changes and parenchymal volume loss of the brain. Small focus of susceptibility hypointensity within left cerebellum compatible with chronic microhemorrhage. Vascular: Persistent central flow voids. Skull and upper cervical spine: Normal marrow signal. Sinuses/Orbits: Mild paranasal sinus mucosal thickening. No significant abnormal signal of mastoid air cells. Bilateral intra-ocular lens replacement. Other: None. IMPRESSION: 1. Large region of acute/early subacute infarction involving right MCA and PCA territories. Involvement of right MCA and PCA distributions is likely due to fetal PCA anatomy. 2. 3.2 cm, 4.6 cc acute hemorrhage within right lateral temporal lobe. Electronically Signed: By: Mitzi Hansen M.D. On: 06/20/2017 18:15   Ct Cerebral Perfusion W Contrast  Result Date: 06/19/2017 CLINICAL DATA:  Left-sided weakness. EXAM: CT PERFUSION BRAIN TECHNIQUE: Multiphase CT imaging of the brain was performed following IV bolus contrast injection. Subsequent parametric perfusion maps were calculated using RAPID software. CONTRAST:  40 cc Isovue 370 COMPARISON:  CT angiography and CT same day. FINDINGS: CT Brain Perfusion Findings: CBF (<30%) Volume: 24mL, primarily in the inferior right temporal lobe and posterior inferior right parietal lobe. Perfusion (Tmax>6.0s) volume: , with the mismatch volume being primarily in the deep insular region on the right with some additional brain along the margins of the right parietal CBF less than 30% region. Mismatch Volume: 82 mL. This may be slightly overstated based on inclusion of some brain in the region of the old left posterior frontal infarction. Infarction Location:Right MCA territory The  examination does not have any significant motion degradation. Hypoperfusion index is 0.4, an unfavorable finding. There does not appear to be vigorous collateral supply. IMPRESSION: Acute changes in the right MCA territory with likely completed infarction affecting the right inferior temporal lobe and right inferior parietal lobe with potentially salvageable brain in the insular region and along the margins of the likely completed right parietal infarction. Electronically Signed   By: Paulina Fusi M.D.   On: 06/19/2017 20:22   Dg Chest Portable 1 View  Result Date: 06/22/2017 CLINICAL DATA:  Respiratory failure EXAM: PORTABLE CHEST 1 VIEW COMPARISON:  06/20/2017 FINDINGS: Cardiac shadow remains enlarged. Postsurgical changes are again noted. The endotracheal tube has been removed. Persistent changes are noted in the left mid and lower lung. Increasing right basilar atelectasis and effusion is seen. No bony abnormality is noted. IMPRESSION: Persistent changes in the left base. Increasing basilar atelectasis and effusion on the right. Electronically Signed   By: Alcide Clever M.D.   On: 06/22/2017 07:37   Portable Chest X-ray  Result Date: 06/20/2017 CLINICAL DATA:  Endotracheal tube present. EXAM: PORTABLE CHEST 1 VIEW COMPARISON:  Radiograph yesterday at 2040 hour FINDINGS: Endotracheal tube in place, tip at the level of the clavicular heads. Patient is post median sternotomy. Cardiomegaly is unchanged. Developing fluffy left greater than right perihilar opacities. No large pleural effusion or pneumothorax. IMPRESSION: 1. Tip of the endotracheal tube at the thoracic inlet. 2. Developing fluffy perihilar opacities, left greater than right, suspicious for pulmonary edema. Cardiomegaly is unchanged. Electronically Signed   By: Rubye Oaks M.D.   On: 06/20/2017 01:06   Dg Chest Portable 1 View  Result Date: 06/19/2017 CLINICAL DATA:  Dyspnea.  History  of emphysema EXAM: PORTABLE CHEST 1 VIEW COMPARISON:   03/07/2016 FINDINGS: The heart is enlarged but stable. 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 Low lung volumes with mild vascular crowding and streaky areas of atelectasis but no definite infiltrates, edema or effusions. The bony thorax is intact. IMPRESSION: Stable cardiac enlargement. Low lung volumes with vascular crowding and streaky atelectasis. Electronically Signed   By: Rudie Meyer M.D.   On: 06/19/2017 21:08   Dg Swallowing Func-speech Pathology  Result Date: 06/29/2017 Objective Swallowing Evaluation: Type of Study: Bedside Swallow Evaluation  Patient Details Name: Ronald [name] MRN: [id number] Date of Birth: Oct 21, 1940 Today's Date: 06/29/2017 Time: SLP Start Time (ACUTE ONLY): 1133 -SLP Stop Time (ACUTE ONLY): 1200 SLP Time Calculation (min) (ACUTE ONLY): 27 min Past Medical History: Past Medical History: Diagnosis Date . COPD (chronic obstructive pulmonary disease) (HCC)  . Emphysema lung (HCC)  . Hx of long term use of blood thinners  . Hypertension  . Stroke [facility]) 2013 . Thyroid disease   hypothyroidism Past Surgical History: Past Surgical History: Procedure Laterality Date . IR ANGIO EXTRACRAN SEL COM CAROTID INNOMINATE UNI R MOD SED  06/19/2017 . IR ANGIO VERTEBRAL SEL SUBCLAVIAN INNOMINATE UNI R MOD SED  06/19/2017 . RADIOLOGY WITH ANESTHESIA N/A 06/19/2017  Procedure: RADIOLOGY WITH ANESTHESIA;  Surgeon: Julieanne Cotton, MD;  Location: [facility] OR;  Service: Radiology;  Laterality: N/A; HPI: Mr.Makiah L Burnsis a 77 y.o.malewith PMH of AFIB on Coumadin with subtherapeutic INR, Hx of CVA, HTN and Hypothyroidism who presentswith acute left-sided weakness and questionable M2 occlusion with improvement following IV TPA.Large region of acute/early subacute infarction involving right MCA and PCA territories. Involvement of right MCA and PCA  No Data Recorded Assessment / Plan / Recommendation CHL IP CLINICAL IMPRESSIONS 06/29/2017 Clinical Impression Pt is at EXTREMELY HIGH risk for aspiration and  should REMAIN NPO with consideration of long term menas of alternate nutrition. Of note, pt with previous CVA and nonspecific report of being on altered diet in the past. However in the most immediate present prior to this admission, pt was consuming regular with thin liquids. Unknown if he had overt s/s of aspiration with consumption. On this date, pt demonstrates severe sensorimotor oropharyngeal dysphagia. Pt with severe oral phase deficits with honey thick liquids, nectar thick liquids BY SPOON only and puree. For all consistencies administered, pt has severely decreased lingual manipulation of bolus, no bolus cohesion which left widespread residue throughout oral cavity (under tongue, anteriorally and laterally). Pt required Total A multimodal cues to move bolus posteriorally and demonstrated premature spillage and piecemeal swallowing as a result of decreased oral containment of bolus. Pt with severely delayed swallow initiation at pyriform sinuses after each bolus remained in pyriform sinuses for several seconds. Pt with consistent penetration during swallow with downward motion of penetration but witnessed ejection. Further pt exhibited frank aspiration after the swallow of anterior and posterior residue. Although pt had intermittent delayed cough, it was completely ineffective any clearing any penetrates or aspiriated material. Pt with severe motor deficits as evidenced by widespread residue throughout pharynx. Even with Total A multimodal cues to produce repeat swallows, it took pt at least 7 swallows to effectively reduce pharyngeal residue to mild amount. Despite complete attempts by pt, SLP removed 1/2 of bolus from mouth in order to administer another trial of different consistency. At end of study, pt required SLP to remove moderate amount of oral residue. Recommend pt continuing STRICT NPO with no further trials until another instrumental sutdy demonstrates  increased airway protection. Recommend long  term alternate means of nutrition in the setting of severe acute on chronic oropharyngeal dysphagia. Would target base of tongue strengthening and swallow initiation with thermal stimulation.  SLP Visit Diagnosis Aphasia (R47.01);Dysarthria and anarthria (R47.1);Dysphagia, oropharyngeal phase (R13.12) Attention and concentration deficit following -- Frontal lobe and executive function deficit following -- Impact on safety and function Severe aspiration risk;Risk for inadequate nutrition/hydration   CH[facility] IP TREATMENT RECOMMENDATION 06/29/2017 Treatment Recommendations Therapy as outlined in treatment plan below   Prognosis 06/21/2017 Prognosis for Safe Diet Advancement Fair Barriers to Reach Goals Severity of deficits Barriers/Prognosis Comment -- CHL IP DIET RECOMMENDATION 06/29/2017 SLP Diet Recommendations Alternative means - long-term;NPO Liquid Administration via -- Medication Administration Via alternative means Compensations -- Postural Changes --   CHL IP OTHER RECOMMENDATIONS 06/29/2017 Recommended Consults -- Oral Care Recommendations Oral care QID Other Recommendations --   CHL IP FOLLOW UP RECOMMENDATIONS 06/22/2017 Follow up Recommendations Inpatient Rehab   CHL IP FREQUENCY AND DURATION 06/21/2017 Speech Therapy Frequency (ACUTE ONLY) min 3x week Treatment Duration --      CHL IP ORAL PHASE 06/29/2017 Oral Phase Impaired Oral - Pudding Teaspoon -- Oral - Pudding Cup -- Oral - Honey Teaspoon Right anterior bolus loss;Weak lingual manipulation;Lingual pumping;Incomplete tongue to palate contact;Reduced posterior propulsion;Left pocketing in lateral sulci;Left anterior bolus loss;Pocketing in anterior sulcus;Lingual/palatal residue;Piecemeal swallowing;Decreased bolus cohesion;Premature spillage;Delayed oral transit Oral - Honey Cup -- Oral - Nectar Teaspoon Left anterior bolus loss;Right anterior bolus loss;Weak lingual manipulation;Lingual pumping;Incomplete tongue to palate contact;Reduced posterior  propulsion;Left pocketing in lateral sulci;Piecemeal swallowing;Delayed oral transit;Pocketing in anterior sulcus;Decreased bolus cohesion;Lingual/palatal residue Oral - Nectar Cup -- Oral - Nectar Straw -- Oral - Thin Teaspoon -- Oral - Thin Cup -- Oral - Thin Straw -- Oral - Puree Left anterior bolus loss;Right anterior bolus loss;Impaired mastication;Weak lingual manipulation;Lingual pumping;Incomplete tongue to palate contact;Reduced posterior propulsion;Left pocketing in lateral sulci;Pocketing in anterior sulcus;Lingual/palatal residue;Piecemeal swallowing;Delayed oral transit;Decreased bolus cohesion;Premature spillage Oral - Mech Soft -- Oral - Regular -- Oral - Multi-Consistency -- Oral - Pill -- Oral Phase - Comment Total A multimodal cues were given and none were effective in changing severe oral phase deficits  CHL IP PHARYNGEAL PHASE 06/29/2017 Pharyngeal Phase Impaired Pharyngeal- Pudding Teaspoon -- Pharyngeal -- Pharyngeal- Pudding Cup -- Pharyngeal -- Pharyngeal- Honey Teaspoon Delayed swallow initiation-pyriform sinuses;Reduced pharyngeal peristalsis;Reduced airway/laryngeal closure;Reduced tongue base retraction;Moderate aspiration;Significant aspiration (Amount);Pharyngeal residue - valleculae;Pharyngeal residue - pyriform;Lateral channel residue;Inter-arytenoid space residue;Penetration/Aspiration during swallow;Penetration/Apiration after swallow;Pharyngeal residue - cp segment;Pharyngeal residue - posterior pharnyx Pharyngeal Material enters airway, passes BELOW cords and not ejected out despite cough attempt by patient;Other (Comment) Pharyngeal- Honey Cup -- Pharyngeal -- Pharyngeal- Nectar Teaspoon Delayed swallow initiation-vallecula;Delayed swallow initiation-pyriform sinuses;Reduced pharyngeal peristalsis;Reduced tongue base retraction;Reduced airway/laryngeal closure;Penetration/Aspiration during swallow;Penetration/Apiration after swallow;Significant aspiration (Amount);Pharyngeal  residue - valleculae;Pharyngeal residue - pyriform;Pharyngeal residue - posterior pharnyx;Pharyngeal residue - cp segment;Inter-arytenoid space residue;Lateral channel residue;Moderate aspiration Pharyngeal Material enters airway, passes BELOW cords and not ejected out despite cough attempt by patient Pharyngeal- Nectar Cup -- Pharyngeal -- Pharyngeal- Nectar Straw -- Pharyngeal -- Pharyngeal- Thin Teaspoon -- Pharyngeal -- Pharyngeal- Thin Cup -- Pharyngeal -- Pharyngeal- Thin Straw -- Pharyngeal -- Pharyngeal- Puree Delayed swallow initiation-vallecula;Delayed swallow initiation-pyriform sinuses;Reduced pharyngeal peristalsis;Reduced airway/laryngeal closure;Reduced tongue base retraction;Penetration/Aspiration during swallow;Penetration/Apiration after swallow;Moderate aspiration;Significant aspiration (Amount);Pharyngeal residue - valleculae;Pharyngeal residue - pyriform;Pharyngeal residue - posterior pharnyx;Pharyngeal residue - cp segment;Inter-arytenoid space residue;Lateral channel residue Pharyngeal Material enters airway, passes BELOW cords and not ejected out despite cough attempt  by patient Pharyngeal- Mechanical Soft -- Pharyngeal -- Pharyngeal- Regular -- Pharyngeal -- Pharyngeal- Multi-consistency -- Pharyngeal -- Pharyngeal- Pill -- Pharyngeal -- Pharyngeal Comment although pt displays INTERMITTENT delayed cough, not effective in clearing apiration and requires 6 to 7 multiple swallows to decrease residue throughout pharynx  CHL IP CERVICAL ESOPHAGEAL PHASE 06/29/2017 Cervical Esophageal Phase WFL Pudding Teaspoon -- Pudding Cup -- Honey Teaspoon -- Honey Cup -- Nectar Teaspoon -- Nectar Cup -- Nectar Straw -- Thin Teaspoon -- Thin Cup -- Thin Straw -- Puree -- Mechanical Soft -- Regular -- Multi-consistency -- Pill -- Cervical Esophageal Comment -- No flowsheet data found. Happi Overton 06/29/2017, 1:02 PM              Ct Head Code Stroke Wo Contrast  Result Date: 06/19/2017 CLINICAL DATA:  Code  stroke. Left-sided weakness. Acute presentation. EXAM: CT HEAD WITHOUT CONTRAST TECHNIQUE: Contiguous axial images were obtained from the base of the skull through the vertex without intravenous contrast. COMPARISON:  MRI 07/17/2012 FINDINGS: Brain: No abnormality is seen affecting the brainstem or cerebellum. Right cerebral hemisphere shows chronic small-vessel changes of the white matter without evidence of acute infarction. Left hemisphere shows an old insular to posterior frontal cortical and subcortical infarction with encephalomalacia. Chronic small-vessel changes of the white matter. No sign of acute infarction. No mass lesion, hemorrhage, hydrocephalus or extra-axial collection. Vascular: There is atherosclerotic calcification of the major vessels at the base of the brain. Skull: Negative Sinuses/Orbits: No significant sinus finding.  Orbits negative. Other: None ASPECTS ([place] Stroke Program Early CT [name]) - Ganglionic level infarction (caudate, lentiform nuclei, internal capsule, insula, M1-M3 cortex): 7 - Supraganglionic infarction (M4-M6 cortex): 3 Total [name] (0-10 with 10 being normal): 10 IMPRESSION: 1. No acute finding. Chronic small-vessel changes of the white matter. 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 2. ASPECTS is 10 3. These results were communicated to Dr. Amada Jupiter at 7:29 pmon 2/18/2019by text page via the [facility] messaging system. Electronically Signed   By: Paulina Fusi M.D.   On: 06/19/2017 19:30   Ir Angio Extracran Sel Com Carotid Innominate Uni R Mod Sed  Result Date: 06/21/2017 CLINICAL DATA:  Acute onset of confusion. Left-sided weakness. CT angiogram of the head and neck suggestive of a right MCA M2 occlusion. EXAM: IR ANGIO EXTRACRAN SELECT COM CAROTID INNOMINATE UNI*R* MOD SED; IR ANGIO VERTEBRAL SEL SUBCLAVIAN INNOMINATE UNI RIGHT MOD SED COMPARISON:  CT angiogram of the head and neck of 06/19/2017. MEDICATIONS: Heparin 1000 units IV; Ancef 2 g IV antibiotic was  administered within 1 hour of the procedure. ANESTHESIA/SEDATION: General anesthesia. CONTRAST:  Isovue 300 approximately 50 mL. FLUOROSCOPY TIME:  Fluoroscopy Time: 5 minutes 15 seconds (401 mGy). COMPLICATIONS: None immediate. TECHNIQUE: Informed written consent was obtained from the patient's daughter after a thorough discussion of the procedural risks, benefits and alternatives. All questions were addressed. Maximal Sterile Barrier Technique was utilized including caps, mask, sterile gowns, sterile gloves, sterile drape, hand hygiene and skin antiseptic. A timeout was performed prior to the initiation of the procedure. The right groin was prepped and draped in the usual sterile fashion. Thereafter using modified Seldinger technique, transfemoral access into the right common femoral artery was obtained without difficulty. Over a 0.035 inch guidewire, a 5 French Pinnacle sheath was inserted. Through this, and also over 0.035 inch guidewire, a 5 [place] JB 1 catheter was advanced to the aortic arch region and selectively positioned in the right common carotid artery, the right vertebral artery. FINDINGS: The innominate artery  injection demonstrates the origin of the right common carotid artery and the right subclavian artery to be widely patent. The right common carotid arteriogram demonstrates wide patency of the right external carotid artery and its branches. The right internal carotid artery at the bulb to the cranial skull base also opacifies widely. The petrous, cavernous and supraclinoid segments are normally opacified. A right posterior communicating artery is seen opacifying the right posterior cerebral artery distribution. The right middle cerebral artery and the right anterior cerebral artery opacify into the capillary and venous phases. Multiple focal areas of caliber irregularity and narrowing are seen involving the right anterior cerebral artery A2 segment and to a lesser degree the A3 pericallosal  region. The right middle cerebral artery trifurcation region demonstrates a segmental focal stenosis of the dominant inferior division proximally. More distally mild focal areas of caliber irregularity involving the distal M3 branches. The right vertebral artery origin is widely patent. The vessel is seen to opacify to the cranial skull base. Wide patency is seen of the right vertebrobasilar junction and the right posterior-inferior cerebellar artery. The basilar artery, the left posterior cerebral artery, the superior cerebellar arteries and the anterior-inferior cerebellar arteries demonstrate opacification into the capillary and venous phases. Nonvisualization of the right posterior cerebral artery is related to the dominant right posterior communicating artery supplying the right posterior cerebral artery distribution. Caliber irregularity is noted of the right vertebrobasilar junction in the mid basilar artery probably indicative of intracranial atherosclerotic disease. IMPRESSION: Angiographically no evidence of vessel occlusions involving the right MCA and the right vertebral artery basilar artery circulation. 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 PLAN: As per referring physician. Electronically Signed   By: Julieanne Cotton M.D.   On: 06/20/2017 12:08   Ir Angio Vertebral Sel Subclavian Innominate Uni R Mod Sed  Result Date: 06/21/2017 CLINICAL DATA:  Acute onset of confusion. Left-sided weakness. CT angiogram of the head and neck suggestive of a right MCA M2 occlusion. EXAM: IR ANGIO EXTRACRAN SELECT COM CAROTID INNOMINATE UNI*R* MOD SED; IR ANGIO VERTEBRAL SEL SUBCLAVIAN INNOMINATE UNI RIGHT MOD SED COMPARISON:  CT angiogram of the head and neck of  06/19/2017. MEDICATIONS: Heparin 1000 units IV; Ancef 2 g IV antibiotic was administered within 1 hour of the procedure. ANESTHESIA/SEDATION: General anesthesia. CONTRAST:  Isovue 300 approximately 50 mL. FLUOROSCOPY TIME:  Fluoroscopy Time: 5 minutes 15 seconds (401 mGy). COMPLICATIONS: None immediate. TECHNIQUE: Informed written consent was obtained from the patient's daughter after a thorough discussion of the procedural risks, benefits and alternatives. All questions were addressed. Maximal Sterile Barrier Technique was utilized including caps, mask, sterile gowns, sterile gloves, sterile drape, hand hygiene and skin antiseptic. A timeout was performed prior to the initiation of the procedure. The right groin was prepped and draped in the usual sterile fashion. Thereafter using modified Seldinger technique, transfemoral access into the right common femoral artery was obtained without difficulty. Over a 0.035 inch guidewire, a 5 French Pinnacle sheath was inserted. Through this, and also over 0.035 inch guidewire, a 5 [place] JB 1 catheter was advanced to the aortic arch region and selectively positioned in the right common carotid artery, the right vertebral artery. FINDINGS: The innominate artery injection demonstrates the origin of the right common carotid artery and the right subclavian artery to be widely patent. The  right common carotid arteriogram demonstrates wide patency of the right external carotid artery and its branches. The right internal carotid artery at the bulb to the cranial skull base also opacifies widely. The petrous, cavernous and supraclinoid segments are normally opacified. A right posterior communicating artery is seen opacifying the right posterior cerebral artery distribution. The right middle cerebral artery and the right anterior cerebral artery opacify into the capillary and venous phases. Multiple focal areas of caliber irregularity and narrowing are seen involving the right  anterior cerebral artery A2 segment and to a lesser degree the A3 pericallosal region. The right middle cerebral artery trifurcation region demonstrates a segmental focal stenosis of the dominant inferior division proximally. More distally mild focal areas of caliber irregularity involving the distal M3 branches. The right vertebral artery origin is widely patent. The vessel is seen to opacify to the cranial skull base. Wide patency is seen of the right vertebrobasilar junction and the right posterior-inferior cerebellar artery. The basilar artery, the left posterior cerebral artery, the superior cerebellar arteries and the anterior-inferior cerebellar arteries demonstrate opacification into the capillary and venous phases. Nonvisualization of the right posterior cerebral artery is related to the dominant right posterior communicating artery supplying the right posterior cerebral artery distribution. Caliber irregularity is noted of the right vertebrobasilar junction in the mid basilar artery probably indicative of intracranial atherosclerotic disease. IMPRESSION: Angiographically no evidence of vessel occlusions involving the right MCA and the right vertebral artery basilar artery circulation. 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 PLAN: As per referring physician. Electronically Signed   By: Julieanne Cotton M.D.   On: 06/20/2017 12:08    Labs:  CBC: Recent Labs    06/20/17 0331 06/21/17 0346 06/23/17 0514 06/26/17 0601  WBC 8.4 8.9 7.9 6.4  HGB 13.2 13.5 12.3* 13.5  HCT 37.9* 39.1 34.3* 38.3*  PLT 147* 135* 140* 211    COAGS: Recent Labs    06/19/17 1917  INR 1.23  APTT 32    BMP: Recent Labs    06/21/17 1235 06/22/17 0355  06/23/17 0514 06/26/17 0601  NA 137 137 140 137  K 4.0 3.8 3.5 3.6  CL 105 104 108 103  CO2 24 23 19* 24  GLUCOSE 112* 113* 93 112*  BUN 7 7 8 16   CALCIUM 8.7* 8.7* 8.8* 8.7*  CREATININE 0.64 0.67 0.60* 0.60*  GFRNONAA >60 >60 >60 >60  GFRAA >60 >60 >60 >60    LIVER FUNCTION TESTS: Recent Labs    06/19/17 1917 06/26/17 0601  BILITOT 0.6 1.4*  AST 30 48*  ALT 19 23  ALKPHOS 43 47  PROT 6.3* 6.0*  ALBUMIN 3.8 3.0*    TUMOR MARKERS: No results for input(s): AFPTM, CEA, CA199, CHROMGRNA in the last 8760 hours.  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   If oral cavity can be improved in the next few days, can proceed with percutaneous gastrostomy, if not, recommend General Surgery to place gastrostomy tube.  Risks and benefits discussed with the patient including, but not limited to the need for a barium enema during the procedure, bleeding, infection, peritonitis, or damage to adjacent structures.  All of the patient's questions were answered, patient is agreeable to proceed. Consent signed and in chart.  Thank  you for this interesting consult.  I greatly enjoyed meeting Ronald [name] and look forward to participating in their care.  A copy of this report was sent to the requesting provider on this date.  Electronically Signed: Gwynneth Macleod, PA-C 06/29/2017, 3:11 PM   I spent a total of 40 Minutes in face to face in clinical consultation, greater than 50% of which was counseling/coordinating care for gastrostomy tube.

## 2017-06-29 NOTE — Progress Notes (Signed)
Pharmacy Antibiotic Note  Ronald [name] is a 77 y.o. male admitted on 06/23/2017 with oropharyngeal candidiasis.  Pharmacy has been consulted for fluconazole dosing.  Plan: Start fluconazole 200 mg PO x 1, followed by 100 mg daily x 13 days Monitor clinical progress, renal function, abx plan   Height: 5\' 11"  (180.3 cm) Weight: 159 lb 2.8 oz (72.2 kg) IBW/kg (Calculated) : 75.3  Temp (24hrs), Avg:98.3 F (36.8 C), Min:98.3 F (36.8 C), Max:98.3 F (36.8 C)  Recent Labs  Lab 06/23/17 0514 06/26/17 0601  WBC 7.9 6.4  CREATININE 0.60* 0.60*    Estimated Creatinine Clearance: 81.5 mL/min (A) (by C-G formula based on SCr of 0.6 mg/dL (L)).    No Known Allergies  Antimicrobials this admission: 2/22 Unasyn >>  2/28 Fluconazole >> (3/13)   Thank you for allowing us to participate in this patients care.  Signe Coltonya C Savreen Gebhardt, PharmD Clinical phone for 06/29/2017 from 7a-3:30p: [phone] If after 3:30p, please call main pharmacy at: [phone] 06/29/2017 3:54 PM

## 2017-06-29 NOTE — Progress Notes (Signed)
Modified Barium Swallow Progress Note  Patient Details  Name: Ronald [name] MRN: [id number] Date of Birth: [birth year]  Today's Date: 06/29/2017  Modified Barium Swallow completed.  Full report located under Chart Review in the Imaging Section.  Brief recommendations include the following:  Clinical Impression  Pt is at EXTREMELY HIGH risk for aspiration and should REMAIN NPO with consideration of long term means of alternate nutrition.   Of note, pt with previous CVA and nonspecific report of being on altered diet in the past. However in the most immediate present prior to this admission, pt was consuming regular with thin liquids. Unknown if he had overt s/s of aspiration with consumption.   On this date, pt demonstrates severe sensorimotor oropharyngeal dysphagia. Pt with severe oral phase deficits with honey thick liquids, nectar thick liquids BY SPOON only and puree. For all consistencies administered, pt has severely decreased lingual manipulation of bolus, no bolus cohesion which left widespread residue throughout oral cavity (under tongue, anteriorally and laterally). Pt required Total A multimodal cues to move bolus posteriorally and demonstrated premature spillage and piecemeal swallowing as a result of decreased oral containment of bolus. Pt with severely delayed swallow initiation at pyriform sinuses after each bolus remained in pyriform sinuses for several seconds. Pt with consistent penetration during swallow with downward motion of penetration and no witnessed ejection. Further pt exhibited frank aspiration after the swallow of anterior and posterior residue. Although pt had intermittent delayed cough, it was completely ineffective any clearing any penetrates or aspiriated material. Pt with severe motor deficits as evidenced by widespread residue throughout pharynx. Even with Total A multimodal cues to produce repeat swallows, it took pt at least 7 swallows to effectively reduce  pharyngeal residue to mild amount. Despite complete attempts by pt, SLP removed 1/2 of bolus from mouth in order to administer another trial of different consistency. At end of study, pt required SLP to remove moderate amount of oral residue. Recommend pt continuing STRICT NPO with no further trials until another instrumental sutdy demonstrates increased airway protection. Recommend long term alternate means of nutrition in the setting of severe acute on chronic oropharyngeal dysphagia. Would target base of tongue strengthening and swallow initiation with thermal stimulation.    Swallow Evaluation Recommendations       SLP Diet Recommendations: Alternative means - long-term;NPO       Medication Administration: Via alternative means               Oral Care Recommendations: Oral care QID        Sarie Stall 06/29/2017,12:59 PM

## 2017-06-29 NOTE — Progress Notes (Signed)
Subjective/Complaints: Remains severely dysarthric.  No breathing issues noted.  Remains n.p.o.  Review of systems: Difficult to obtain secondary to severe dysarthria, appears to suggest right shoulder pain  Objective: Vital Signs: Blood pressure (!) 144/71, pulse 69, temperature 98.3 F (36.8 C), temperature source Oral, resp. rate 18, height 5\' 11"  (1.803 m), weight 72.2 kg (159 lb 2.8 oz), SpO2 99 %. No results found. Results for orders placed or performed during the hospital encounter of 06/23/17 (from the past 72 hour(s))  Glucose, capillary     Status: None   Collection Time: 06/27/17  5:23 AM  Result Value Ref Range   Glucose-Capillary 93 65 - 99 mg/dL  Glucose, capillary     Status: Abnormal   Collection Time: 06/27/17  6:56 AM  Result Value Ref Range   Glucose-Capillary 110 (H) 65 - 99 mg/dL  Glucose, capillary     Status: Abnormal   Collection Time: 06/28/17  6:27 AM  Result Value Ref Range   Glucose-Capillary 110 (H) 65 - 99 mg/dL  Glucose, capillary     Status: Abnormal   Collection Time: 06/29/17  6:25 AM  Result Value Ref Range   Glucose-Capillary 122 (H) 65 - 99 mg/dL     HEENT: Normocephalic, atraumatic. +NG Cardio: irregularly irregular and no JVD. Resp: CTA B/L and unlabored, occasional crackles left base GI: BS positive and ND Skin:   Scattered bruises Neuro: Limited due to severe dysarthria and aphasia Motor:  4/5 on Left sided 5/5 on right side Musc/Skel:  No edema. No tenderness.  No pain with PROM right shoulder Gen NAD. Vital signs reviewed  Assessment/Plan: 1. 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 Physiatrist is providing close team supervision and 24 hour management of active  medical problems listed below. Physiatrist and rehab team continue to assess barriers to discharge/monitor patient progress toward functional and medical goals. FIM: Function - Bathing Position: Shower Body parts bathed by patient: Left arm, Chest, Abdomen, Front perineal area, Buttocks, Right upper leg, Left upper leg, Right lower leg, Left lower leg, Right arm Body parts bathed by helper: Back Assist Level: Touching or steadying assistance(Pt > 75%)  Function- Upper Body Dressing/Undressing What is the patient wearing?: Pull over shirt/dress Pull over shirt/dress - Perfomed by patient: Thread/unthread right sleeve, Put head through opening, Pull shirt over trunk, Thread/unthread left sleeve Pull over shirt/dress - Perfomed by helper: Thread/unthread left sleeve Button up shirt - Perfomed by patient: Thread/unthread right sleeve Button up shirt - Perfomed by helper: Thread/unthread left sleeve, Pull shirt around back Assist Level: Supervision or verbal cues Function - Lower Body Dressing/Undressing What is the patient wearing?: Socks, Shoes, Pants, Underwear Position: Wheelchair/chair at [employer] Underwear - Performed by patient: Thread/unthread right underwear leg, Thread/unthread left underwear leg, Pull underwear up/down Underwear - Performed by helper: Pull underwear up/down Pants- Performed by patient: Thread/unthread right pants leg, Thread/unthread left pants leg, Pull pants up/down Pants- Performed by helper: Pull pants up/down Socks - Performed by helper: Don/doff right sock, Don/doff left sock Shoes - Performed by patient: Don/doff right shoe, Don/doff left shoe(Slip ons) Assist for footwear: Supervision/touching assist Assist for lower body dressing: Touching or steadying assistance (Pt > 75%)  Function - Toileting Toileting steps completed by patient: Adjust clothing prior to toileting, Performs perineal hygiene,  Adjust clothing after toileting Toileting steps completed by  helper: Adjust clothing after toileting Toileting Assistive Devices: Grab bar or rail Assist level: Supervision or verbal cues  Function - Toilet Transfers Toilet transfer assistive device: Elevated toilet seat/BSC over toilet Assist level to toilet: Touching or steadying assistance (Pt > 75%) Assist level from toilet: Touching or steadying assistance (Pt > 75%)  Function - Chair/bed transfer Chair/bed transfer method: Stand pivot, Ambulatory Chair/bed transfer assist level: Touching or steadying assistance (Pt > 75%) Chair/bed transfer assistive device: Armrests, Walker Chair/bed transfer details: Verbal cues for precautions/safety, Verbal cues for technique, Verbal cues for sequencing  Function - Locomotion: Wheelchair Will patient use wheelchair at discharge?: No Function - Locomotion: Ambulation Assistive device: Walker-rolling Max distance: 252ft  Assist level: Touching or steadying assistance (Pt > 75%) Assist level: Touching or steadying assistance (Pt > 75%) Assist level: Touching or steadying assistance (Pt > 75%) Assist level: Touching or steadying assistance (Pt > 75%) Walk 10 feet on uneven surfaces activity did not occur: Safety/medical concerns Assist level: Touching or steadying assistance (Pt > 75%)  Function - Comprehension Comprehension: Auditory Comprehension assist level: Understands basic 50 - 74% of the time/ requires cueing 25 - 49% of the time  Function - Expression Expression: Verbal, Nonverbal Expression assist level: Expresses basic 25 - 49% of the time/requires cueing 50 - 75% of the time. Uses single words/gestures.  Function - Social Interaction Social Interaction assist level: Interacts appropriately 25 - 49% of time - Needs frequent redirection.  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 DVT Prophylaxis/Anticoagulation: SCD's only given hemorrhage.Monitor for signs of DVT -Dopplers negative 3. Pain Management:Tylenol as needed   Voltaren gel ordered for right shoulder 4. Mood:Provide emotional support 5. Neuropsych: This patientiscapable of making decisions on hisown behalf. 6. Skin/Wound Care:Routine skin checks 7. Fluids/Electrolytes/Nutrition:Routine I&Os   BMP within acceptable range on 2/25 8. Dysphagia.NPO. Cortrakfor nutritional support.  Repeat modified barium swallow, will need to make decision regarding PEG this week 9.Aspiration pneumonia. Completed course of Unasyn -monitor for recurrent sx, no signs of respiratory distress 10.Hypothyroidism. Synthroid 11.Hyperlipidemia.Lipitor 12.Atrial fibrillation. Cardiac rate controlled. Chronic Coumadin on hold d/t hemorrhage. Start ASA 81mg , repeat CT scan once plan for PEG is determined 13.Tobacco abuse. Counseling 14.  Hypoalbuminemia start pro-stat 15. HTN    Vitals:   06/28/17 1645 06/29/17 0352  BP:  (!) 144/71  Pulse: 86 69  Resp: 18 18  Temp:  98.3 F (36.8 C)  SpO2: 98% 99%     Lisinopril 5 started on 2/27 16. Aspiration PNA Resolved  LOS (Days) 6 A FACE TO FACE EVALUATION WAS PERFORMED  [name] 06/29/2017, 10:29 AM

## 2017-06-29 NOTE — Progress Notes (Signed)
Speech Language Pathology Weekly Progress and Session Note  Patient Details  Name: Ronald [name] MRN: [id number] Date of Birth: Jan 28, 1941  Beginning of progress report period: June 22, 2017 End of progress report period: June 29, 2017  Today's Date: 06/29/2017   Skilled treatment session #1 SLP Individual Time: 0900-0920 SLP Individual Time Calculation (min): 20 min  Skilled treatment session #2 SLP Individual Time: 3664-4034 SLP Individual Time Calculation (min): 25 min   Skilled treatment session #3 SLP Individual Time: 1400-1420 SLP Individual Time Calculation (min): 20 min  Short Term Goals: Week 1: SLP Short Term Goal 1 (Week 1): Pt will consume therapeutic PO trials of ice chips and/or tsp sips of water without overt s/sx of aspiration 100% of the time over 3 sessions. SLP Short Term Goal 1 - Progress (Week 1): Discontinued (comment)(Medical safety) SLP Short Term Goal 2 (Week 1): Pt will communicate wants/needs via multimodal communication including gestures, communication board, and verbal attempts given Max A verbal cues.  SLP Short Term Goal 2 - Progress (Week 1): Not met SLP Short Term Goal 3 (Week 1): Pt will imitate single-syllable functional words with ~50% intelligibility given Max A model prompts.  SLP Short Term Goal 3 - Progress (Week 1): Not met SLP Short Term Goal 4 (Week 1): Pt will follow functional 2-step commands within context given Mod A verbal and visual cues.  SLP Short Term Goal 4 - Progress (Week 1): Not met    New Short Term Goals: Week 2: SLP Short Term Goal 1 (Week 2): Pt will facilitate swallow initiation in 5 out of 10 opportunities with oral stimulation and Max A cues.  SLP Short Term Goal 2 (Week 2): Pt will communicate wants/needs via multimodal communication including gestures, communication board, and verbal attempts given Max A verbal cues.  SLP Short Term Goal 3 (Week 2): Pt will complete 3 reps of CTAR with Max A cues.  SLP Short  Term Goal 4 (Week 2): Pt will follow functional 2-step commands within context given Max A verbal and visual cues.  SLP Short Term Goal 5 (Week 2): Pt will imitate single-syllable functional words with ~50% intelligibility given Max A model prompts.   Weekly Progress Updates: Pt with minimal progress this reporting period and as a result has not met any STGs. MBS completed on 2/28 and dysphagia goals changed to target swallow initiation and strength without any PO intake. Pt continues with minimal ability to express wants and needs but has previous baseline of expressive aphasia, therefore STGs were also adjusted. Anticipate that pt will require SNF placement when discharged from CIR.   Intensity: Minumum of 1-2 x/day, 30 to 90 minutes Frequency: 3 to 5 out of 7 days Duration/Length of Stay: 3/9 Treatment/Interventions: Cognitive remediation/compensation;Cueing hierarchy;Multimodal communication approach;Patient/family education;Speech/Language facilitation;Dysphagia/aspiration precaution training   Daily Session  Skilled treatment session #1 focused on completion of Modified Barium Swallow Study. See impression statement.   Skilled treatment session #2 focused on education of pt's and his roommate on swallow deficits and recommendation for NPO. Pt gestured understanding. Daughter had called and requested SLP call her with results of MBS. SLP called daughter, explained severity of deficits, recommendation for continued NPO and long term alternative means of nutrition. Daughter expresses desire to come in and meet with pt and SLP.   Skilled treatment session #3 focused on education with pt and his daughter. Pt continues to recognize SLP and smiles. Pt able to answer yes/no regarding effort level when swallowing during MBS. Daughter  and SLP explained possibility of PEG placement. He shook head yes. SLP passed along info to MD for IR consult.      Function:     Cognition Comprehension  Comprehension assist level: Understands basic 50 - 74% of the time/ requires cueing 25 - 49% of the time;Understands basic 25 - 49% of the time/ requires cueing 50 - 75% of the time  Expression   Expression assist level: Expresses basis less than 25% of the time/requires cueing >75% of the time.  Social Interaction Social Interaction assist level: Interacts appropriately less than 25% of the time. May be withdrawn or combative.;Interacts appropriately 25 - 49% of time - Needs frequent redirection.  Problem Solving Problem solving assist level: Solves basic less than 25% of the time - needs direction nearly all the time or does not effectively solve problems and may need a restraint for safety  Memory Memory assist level: Recognizes or recalls less than 25% of the time/requires cueing greater than 75% of the time   General    Pain    Therapy/Group: Individual Therapy  Neilan Rizzo 06/29/2017, 2:35 PM

## 2017-06-29 NOTE — Progress Notes (Signed)
Came to room for chest vest therapy, pt is out of room.

## 2017-06-30 ENCOUNTER — Inpatient Hospital Stay (HOSPITAL_COMMUNITY): Payer: Self-pay | Admitting: Occupational Therapy

## 2017-06-30 ENCOUNTER — Inpatient Hospital Stay (HOSPITAL_COMMUNITY): Payer: Self-pay | Admitting: Speech Pathology

## 2017-06-30 ENCOUNTER — Inpatient Hospital Stay (HOSPITAL_COMMUNITY): Payer: Self-pay | Admitting: Physical Therapy

## 2017-06-30 LAB — GLUCOSE, CAPILLARY
GLUCOSE-CAPILLARY: 94 mg/dL (ref 65–99)
Glucose-Capillary: 98 mg/dL (ref 65–99)
Glucose-Capillary: 97 mg/dL (ref 65–99)
Glucose-Capillary: 110 mg/dL — ABNORMAL HIGH (ref 65–99)

## 2017-06-30 MED ORDER — LEVOTHYROXINE SODIUM 75 MCG PO TABS
125.0000 ug | ORAL_TABLET | Freq: Every day | ORAL | Status: DC
  Administered 2017-07-01 – 2017-07-08 (×6): 125 ug
  Filled 2017-06-30 (×6): qty 1

## 2017-06-30 MED ORDER — LISINOPRIL 10 MG PO TABS
10.0000 mg | ORAL_TABLET | Freq: Every day | ORAL | Status: DC
  Administered 2017-07-01 – 2017-07-03 (×3): 10 mg via ORAL
  Filled 2017-06-30 (×3): qty 1

## 2017-06-30 NOTE — Plan of Care (Signed)
  Progressing Consults RH STROKE PATIENT EDUCATION Description See Patient Education module for education specifics  06/30/2017 1157 - Progressing by Marylene LandGrecu, Marko Skalski R, RN RH BLADDER ELIMINATION RH STG MANAGE BLADDER WITH ASSISTANCE Description STG Manage Bladder With Min Assistance   06/30/2017 1157 - Progressing by Marylene LandGrecu, Karver Fadden R, RN Flowsheets Taken 06/30/2017 1157  STG: Pt will manage bladder with assistance 6-Modified independent RH SKIN INTEGRITY RH STG SKIN FREE OF INFECTION/BREAKDOWN Description No new breakdown with min assist   06/30/2017 1157 - Progressing by Marylene LandGrecu, Rayah Fines R, RN RH SAFETY RH STG ADHERE TO SAFETY PRECAUTIONS W/ASSISTANCE/DEVICE Description STG Adhere to Safety Precautions With min Assistance/Device.  06/30/2017 1157 - Progressing by Marylene LandGrecu, Seith Aikey R, RN Flowsheets Taken 06/30/2017 1157  STG:Pt will adhere to safety precautions with assistance/device 5-Supervision/cueing RH COGNITION-NURSING RH STG ANTICIPATES NEEDS/CALLS FOR ASSIST W/ASSIST/CUES Description STG Anticipates Needs/Calls for Assist With Min Assistance/Cues.  06/30/2017 1157 - Progressing by Marylene LandGrecu, Kieren Adkison R, RN Flowsheets Taken 06/30/2017 1157  STG: Anticipates needs/calls for assistance with assistance/cues 4-Minimal assistance

## 2017-06-30 NOTE — Progress Notes (Signed)
Occupational Therapy Session Note  Patient Details  Name: Ronald [name] MRN: [id number] Date of Birth: 1941/01/13  Today's Date: 06/30/2017 OT Individual Time: 0175-1025 OT Individual Time Calculation (min): 75 min    Short Term Goals: Week 1:  OT Short Term Goal 1 (Week 1): Pt will locate 2 bathing/selfcare items left of midline with no more than mod instructional cueing.  OT Short Term Goal 2 (Week 1): Pt will complete UB bathing with supervision in unsupported sitting. OT Short Term Goal 3 (Week 1): Pt will complete LB bathing sit to stand with supervision.  OT Short Term Goal 4 (Week 1): Pt will perform LB dressing with min assist sit to stand.  OT Short Term Goal 5 (Week 1): Pt will complete toilet transfers to elevated toilet or 3:1 with supervision using RW for support.   Skilled Therapeutic Interventions/Progress Updates:    Pt seen this session for ADL training of shower, dressing, toileting and grooming with a focus on visual scanning to his L.  During the session pt demonstrated good use of his LUE and balance. He only needed guiding/ touching A when ambulating in and out of bathroom and transfers to toilet and shower as he has a tendency to pick up and carry his walker, run the walker into L side of doorways and demonstrates L inattention with turning towards surfaces.  After self care was completed, pt taken via w/c to work on L visual [place]. Pt needs to move head to compensate for impaired L and superior visual fields.  He was able to find all lights but in delayed time on the L side.  Pt then taken to gym to work on a standing activity with sorting colored beads into color categories with min-mod cues at times to scan to L to find items in L visual field.  Pt then asked to string beads. Pt used B hands well in task and easily used larger block beads but had difficulty with smaller beads.  Pt tolerated standing for 10 min. Pt taken back to room and requested to get  back to bed. Pt resting in bed with bed alarm set and all needs met.   Therapy Documentation Precautions:  Precautions Precautions: Fall Precaution Comments: NPO  NG tube, left neglect/visual field deficit, global aphasia Restrictions Weight Bearing Restrictions: No  Pain: Pain Assessment Pain Assessment: No/denies pain Pain Score: 0-No pain Faces Pain Scale: No hurt ADL:  See Function Navigator for Current Functional Status.   Therapy/Group: Individual Therapy  [place] 06/30/2017, 12:23 PM

## 2017-06-30 NOTE — Progress Notes (Signed)
Social Work Patient ID: Ronald [name], male   DOB: [birth year], 77 y.o.   MRN: [id number] Left daughter's FMLA forms in pt's room on bulletin board, also faxed them in. Daughter is aware of this. She had questions regarding when PEG would be placed and if being treated for thrush. Answered her questions and will work toward discharge needs next week. Aware pt will require 24 hr supervision-cueing at discharge.

## 2017-06-30 NOTE — Progress Notes (Signed)
Nutrition Follow-up  DOCUMENTATION CODES:   Non-severe (moderate) malnutrition in context of chronic illness  INTERVENTION:   Continue to provide Jevity 1.2 @ 80 mL/hr x20 hours (2 PM-10 AM) with 30 mL Prostat once/day and 150 mL free water TID. This regimen will provide 2020 kcal, 104 grams of protein, and 1741 mL free water.  NUTRITION DIAGNOSIS:   Moderate Malnutrition related to chronic illness as evidenced by mild muscle depletion, moderate muscle depletion, mild fat depletion, moderate fat depletion. Ongoing  GOAL:   Patient will meet greater than or equal to 90% of their needs Meeting.  MONITOR:   TF tolerance, Weight trends, Labs  ASSESSMENT:   77 y.o. 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   Pt was was not able to communicate well due to aphasia/disarthyria. Pt responded with a thumbs up when intern asked if his stomach felt okay. At time of visit, RN was hanging a new bottle of Jevity 1.2. RN reports pt has been tolerating TF at goal rate well without any N/V/D or constipation. Intern confirmed TF/Pro-stat and water flush orders with RN.  Spoke with nurse tech to obtain a bed scale wt for pt as the last wt was taken on 2/22. Will continue to monitor wt trends for any needed adjustments to TF rate.  Labs and medications reviewed.   Diet Order:  Fall precautions Diet NPO time specified  EDUCATION NEEDS:   No education needs have been identified at this time  Skin:  Skin Assessment: Reviewed RN Assessment  Last BM:  2/22  Height:   Ht Readings from Last 1 Encounters:  06/23/17 5\' 11"  (1.803 m)    Weight:   Wt Readings from Last 1 Encounters:  06/23/17 159 lb 2.8 oz (72.2 kg)    Ideal Body Weight:  78.18 kg  BMI:  Body mass index is 22.2  kg/m.  Estimated Nutritional Needs:   Kcal:  2020-2240  Protein:  100-115 grams  Fluid:  >/= 2 L/day   Khaleesi Gruel, MS, Dietetic Intern Pager # [phone]

## 2017-06-30 NOTE — Progress Notes (Signed)
Speech Language Pathology Daily Session Note  Patient Details  Name: Ronald [name] MRN: [id number] Date of Birth: 1940-06-02  Today's Date: 06/30/2017 SLP Individual Time: 1350-1435 SLP Individual Time Calculation (min): 45 min  Short Term Goals: Week 2: SLP Short Term Goal 1 (Week 2): Pt will facilitate swallow initiation in 5 out of 10 opportunities with oral stimulation and Max A cues.  SLP Short Term Goal 2 (Week 2): Pt will communicate wants/needs via multimodal communication including gestures, communication board, and verbal attempts given Max A verbal cues.  SLP Short Term Goal 3 (Week 2): Pt will complete 3 reps of CTAR with Max A cues.  SLP Short Term Goal 4 (Week 2): Pt will follow functional 2-step commands within context given Max A verbal and visual cues.  SLP Short Term Goal 5 (Week 2): Pt will imitate single-syllable functional words with ~50% intelligibility given Max A model prompts.   Skilled Therapeutic Interventions:  Pt was seen for skilled ST targeting communication and dysphagia goals.  SLP facilitated the session with aggressive oral care given presence of thrush and audibly wet andcongested respirations.  Once oral care was completed, SLP utilized lemon swabs to facilitate swallow initiation.  Pt completed 10 volitional swallows with mod-max assist multimodal cues and increased time.   Pt was returned to room and left in bed with call bell within reach.  Continue per current plan of care.     Function:  Cognition Comprehension Comprehension assist level: Understands basic 50 - 74% of the time/ requires cueing 25 - 49% of the time  Expression   Expression assist level: Expresses basis less than 25% of the time/requires cueing >75% of the time.  Social Interaction Social Interaction assist level: Interacts appropriately 25 - 49% of time - Needs frequent redirection.  Problem Solving Problem solving assist level: Solves basic 25 - 49% of the time - needs direction more  than half the time to initiate, plan or complete simple activities  Memory Memory assist level: Recognizes or recalls less than 25% of the time/requires cueing greater than 75% of the time    Pain Pain Assessment Pain Assessment: No/denies pain  Therapy/Group: Individual Therapy  Ronald [name], Ronald [name] 06/30/2017, 3:45 PM

## 2017-06-30 NOTE — Progress Notes (Signed)
Referring Physician(s): Dr. Wynn Banker  Supervising Physician: Gilmer Mor  Patient Status:  [facility] - In-pt  Chief Complaint: Dysphagia  Subjective: Still with thrush.  Has started treatment.   Allergies: Patient has no known allergies.  Medications: Prior to Admission medications   Medication Sig Start Date End Date Taking? Authorizing Provider  Aspirin-Salicylamide-Caffeine (ARTHRITIS STRENGTH BC POWDER PO) Take 1 packet by mouth every hour.   Yes [provider]  gabapentin (NEURONTIN) 100 MG capsule Take 100 mg by mouth 3 (three) times daily as needed (back pain/spasms).  05/19/17  Yes [provider]  levothyroxine (SYNTHROID, LEVOTHROID) 125 MCG tablet Take 125 mcg by mouth daily after supper.  05/19/17  Yes [provider]  metoprolol tartrate (LOPRESSOR) 50 MG tablet Take 50 mg by mouth daily after supper.  05/19/17  Yes [provider]  warfarin (COUMADIN) 5 MG tablet Take 5 mg by mouth daily after supper.  05/19/17  Yes [provider]     Vital Signs: BP (!) 161/72 (BP Location: Left Arm)   Pulse (!) 57   Temp 98.8 F (37.1 C) (Oral)   Resp 18   Ht 5\' 11"  (1.803 m)   Wt 159 lb 2.8 oz (72.2 kg)   SpO2 97%   BMI 22.20 kg/m   Physical Exam  Constitutional: He is oriented to person, place, and time. He appears well-developed.  HENT:  Mouth/Throat: Mucous membranes are dry.  Extensive, thick, white plaque on tongue and palate.  Cardiovascular: Normal rate and regular rhythm.  Pulmonary/Chest: Effort normal and breath sounds normal. No respiratory distress.  Abdominal: Soft.  Neurological: He is alert and oriented to person, place, and time.  Nursing note and vitals reviewed.   Imaging: Ct Abdomen Wo Contrast  Result Date: 06/29/2017 CLINICAL DATA:  Patient with dysphagia.  Evaluate for G-tube. EXAM: CT ABDOMEN WITHOUT CONTRAST TECHNIQUE: Multidetector CT imaging of the abdomen was performed following the standard  protocol without IV contrast. COMPARISON:  CT abdomen 09/05/2005 FINDINGS: Lower chest: Heart is enlarged. Small left pleural effusion. Ground-glass and consolidative opacities left lower lobe. Atelectasis right lower lobe. Hepatobiliary: Liver is low in attenuation compatible with steatosis. Stones within the gallbladder lumen. No intrahepatic or extrahepatic biliary ductal dilatation. Pancreas: Unremarkable Spleen: Unremarkable Adrenals/Urinary Tract: Normal adrenal glands. Kidneys are symmetric in size. No hydronephrosis. Stomach/Bowel: Enteric tube is demonstrated coursing to the third portion of the duodenum. Oral contrast material within the small bowel. Normal morphology of the stomach. 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 No retroperitoneal adenopathy. Peripheral calcified atherosclerotic plaque of the abdominal aorta. Other: None. Musculoskeletal: Thoracic and lumbar spine degenerative changes. No aggressive or acute appearing osseous lesions. IMPRESSION: 1. No acute process within the abdomen. 2. Small left pleural effusion and underlying opacities favored to represent atelectasis. Infection not excluded. Electronically Signed   By: Annia Belt M.D.   On: 06/29/2017 13:11   Dg Swallowing Func-speech Pathology  Result Date: 06/29/2017 Objective Swallowing Evaluation: Type of Study: Bedside Swallow Evaluation  Patient Details Name: Ronald [name] MRN: [id number] Date of Birth: 17-Mar-1941 Today's Date: 06/29/2017 Time: SLP Start Time (ACUTE ONLY): 1133 -SLP Stop Time (ACUTE ONLY): 1200 SLP Time Calculation (min) (ACUTE ONLY): 27 min Past Medical History: Past Medical History: Diagnosis Date . COPD (chronic obstructive pulmonary disease) (HCC)  . Emphysema lung (HCC)  . Hx of long term use of blood thinners  . Hypertension  . Stroke [facility] 2013 . Thyroid disease   hypothyroidism Past Surgical  History: Past Surgical History: Procedure Laterality Date . IR ANGIO EXTRACRAN SEL  COM CAROTID INNOMINATE UNI R MOD SED  06/19/2017 . IR ANGIO VERTEBRAL SEL SUBCLAVIAN INNOMINATE UNI R MOD SED  06/19/2017 . RADIOLOGY WITH ANESTHESIA N/A 06/19/2017  Procedure: RADIOLOGY WITH ANESTHESIA;  Surgeon: Julieanne Cotton, MD;  Location: [facility] OR;  Service: Radiology;  Laterality: N/A; HPI: Mr.Kaliel L Burnsis a 77 y.o.malewith PMH of AFIB on Coumadin with subtherapeutic INR, Hx of CVA, HTN and Hypothyroidism who presentswith acute left-sided weakness and questionable M2 occlusion with improvement following IV TPA.Large region of acute/early subacute infarction involving right MCA and PCA territories. Involvement of right MCA and PCA  No Data Recorded Assessment / Plan / Recommendation CHL IP CLINICAL IMPRESSIONS 06/29/2017 Clinical Impression Pt is at EXTREMELY HIGH risk for aspiration and should REMAIN NPO with consideration of long term menas of alternate nutrition. Of note, pt with previous CVA and nonspecific report of being on altered diet in the past. However in the most immediate present prior to this admission, pt was consuming regular with thin liquids. Unknown if he had overt s/s of aspiration with consumption. On this date, pt demonstrates severe sensorimotor oropharyngeal dysphagia. Pt with severe oral phase deficits with honey thick liquids, nectar thick liquids BY SPOON only and puree. For all consistencies administered, pt has severely decreased lingual manipulation of bolus, no bolus cohesion which left widespread residue throughout oral cavity (under tongue, anteriorally and laterally). Pt required Total A multimodal cues to move bolus posteriorally and demonstrated premature spillage and piecemeal swallowing as a result of decreased oral containment of bolus. Pt with severely delayed swallow initiation at pyriform sinuses after each bolus remained in pyriform sinuses for several seconds. Pt with consistent penetration during swallow with downward motion of penetration but witnessed  ejection. Further pt exhibited frank aspiration after the swallow of anterior and posterior residue. Although pt had intermittent delayed cough, it was completely ineffective any clearing any penetrates or aspiriated material. Pt with severe motor deficits as evidenced by widespread residue throughout pharynx. Even with Total A multimodal cues to produce repeat swallows, it took pt at least 7 swallows to effectively reduce pharyngeal residue to mild amount. Despite complete attempts by pt, SLP removed 1/2 of bolus from mouth in order to administer another trial of different consistency. At end of study, pt required SLP to remove moderate amount of oral residue. Recommend pt continuing STRICT NPO with no further trials until another instrumental sutdy demonstrates increased airway protection. Recommend long term alternate means of nutrition in the setting of severe acute on chronic oropharyngeal dysphagia. Would target base of tongue strengthening and swallow initiation with thermal stimulation.  SLP Visit Diagnosis Aphasia (R47.01);Dysarthria and anarthria (R47.1);Dysphagia, oropharyngeal phase (R13.12) Attention and concentration deficit following -- Frontal lobe and executive function deficit following -- Impact on safety and function Severe aspiration risk;Risk for inadequate nutrition/hydration   CHL IP TREATMENT RECOMMENDATION 06/29/2017 Treatment Recommendations Therapy as outlined in treatment plan below   Prognosis 06/21/2017 Prognosis for Safe Diet Advancement Fair Barriers to Reach Goals Severity of deficits Barriers/Prognosis Comment -- CHL IP DIET RECOMMENDATION 06/29/2017 SLP Diet Recommendations Alternative means - long-term;NPO Liquid Administration via -- Medication Administration Via alternative means Compensations -- Postural Changes --   CHL IP OTHER RECOMMENDATIONS 06/29/2017 Recommended Consults -- Oral Care Recommendations Oral care QID Other Recommendations --   CHL IP FOLLOW UP RECOMMENDATIONS  06/22/2017 Follow up Recommendations Inpatient Rehab   CHL IP FREQUENCY AND DURATION 06/21/2017 Speech Therapy Frequency (ACUTE  ONLY) min 3x week Treatment Duration --      CHL IP ORAL PHASE 06/29/2017 Oral Phase Impaired Oral - Pudding Teaspoon -- Oral - Pudding Cup -- Oral - Honey Teaspoon Right anterior bolus loss;Weak lingual manipulation;Lingual pumping;Incomplete tongue to palate contact;Reduced posterior propulsion;Left pocketing in lateral sulci;Left anterior bolus loss;Pocketing in anterior sulcus;Lingual/palatal residue;Piecemeal swallowing;Decreased bolus cohesion;Premature spillage;Delayed oral transit Oral - Honey Cup -- Oral - Nectar Teaspoon Left anterior bolus loss;Right anterior bolus loss;Weak lingual manipulation;Lingual pumping;Incomplete tongue to palate contact;Reduced posterior propulsion;Left pocketing in lateral sulci;Piecemeal swallowing;Delayed oral transit;Pocketing in anterior sulcus;Decreased bolus cohesion;Lingual/palatal residue Oral - Nectar Cup -- Oral - Nectar Straw -- Oral - Thin Teaspoon -- Oral - Thin Cup -- Oral - Thin Straw -- Oral - Puree Left anterior bolus loss;Right anterior bolus loss;Impaired mastication;Weak lingual manipulation;Lingual pumping;Incomplete tongue to palate contact;Reduced posterior propulsion;Left pocketing in lateral sulci;Pocketing in anterior sulcus;Lingual/palatal residue;Piecemeal swallowing;Delayed oral transit;Decreased bolus cohesion;Premature spillage Oral - Mech Soft -- Oral - Regular -- Oral - Multi-Consistency -- Oral - Pill -- Oral Phase - Comment Total A multimodal cues were given and none were effective in changing severe oral phase deficits  CHL IP PHARYNGEAL PHASE 06/29/2017 Pharyngeal Phase Impaired Pharyngeal- Pudding Teaspoon -- Pharyngeal -- Pharyngeal- Pudding Cup -- Pharyngeal -- Pharyngeal- Honey Teaspoon Delayed swallow initiation-pyriform sinuses;Reduced pharyngeal peristalsis;Reduced airway/laryngeal closure;Reduced tongue base  retraction;Moderate aspiration;Significant aspiration (Amount);Pharyngeal residue - valleculae;Pharyngeal residue - pyriform;Lateral channel residue;Inter-arytenoid space residue;Penetration/Aspiration during swallow;Penetration/Apiration after swallow;Pharyngeal residue - cp segment;Pharyngeal residue - posterior pharnyx Pharyngeal Material enters airway, passes BELOW cords and not ejected out despite cough attempt by patient;Other (Comment) Pharyngeal- Honey Cup -- Pharyngeal -- Pharyngeal- Nectar Teaspoon Delayed swallow initiation-vallecula;Delayed swallow initiation-pyriform sinuses;Reduced pharyngeal peristalsis;Reduced tongue base retraction;Reduced airway/laryngeal closure;Penetration/Aspiration during swallow;Penetration/Apiration after swallow;Significant aspiration (Amount);Pharyngeal residue - valleculae;Pharyngeal residue - pyriform;Pharyngeal residue - posterior pharnyx;Pharyngeal residue - cp segment;Inter-arytenoid space residue;Lateral channel residue;Moderate aspiration Pharyngeal Material enters airway, passes BELOW cords and not ejected out despite cough attempt by patient Pharyngeal- Nectar Cup -- Pharyngeal -- Pharyngeal- Nectar Straw -- Pharyngeal -- Pharyngeal- Thin Teaspoon -- Pharyngeal -- Pharyngeal- Thin Cup -- Pharyngeal -- Pharyngeal- Thin Straw -- Pharyngeal -- Pharyngeal- Puree Delayed swallow initiation-vallecula;Delayed swallow initiation-pyriform sinuses;Reduced pharyngeal peristalsis;Reduced airway/laryngeal closure;Reduced tongue base retraction;Penetration/Aspiration during swallow;Penetration/Apiration after swallow;Moderate aspiration;Significant aspiration (Amount);Pharyngeal residue - valleculae;Pharyngeal residue - pyriform;Pharyngeal residue - posterior pharnyx;Pharyngeal residue - cp segment;Inter-arytenoid space residue;Lateral channel residue Pharyngeal Material enters airway, passes BELOW cords and not ejected out despite cough attempt by patient Pharyngeal- Mechanical  Soft -- Pharyngeal -- Pharyngeal- Regular -- Pharyngeal -- Pharyngeal- Multi-consistency -- Pharyngeal -- Pharyngeal- Pill -- Pharyngeal -- Pharyngeal Comment although pt displays INTERMITTENT delayed cough, not effective in clearing apiration and requires 6 to 7 multiple swallows to decrease residue throughout pharynx  CHL IP CERVICAL ESOPHAGEAL PHASE 06/29/2017 Cervical Esophageal Phase WFL Pudding Teaspoon -- Pudding Cup -- Honey Teaspoon -- Honey Cup -- Nectar Teaspoon -- Nectar Cup -- Nectar Straw -- Thin Teaspoon -- Thin Cup -- Thin Straw -- Puree -- Mechanical Soft -- Regular -- Multi-consistency -- Pill -- Cervical Esophageal Comment -- No flowsheet data found. Happi Overton 06/29/2017, 1:02 PM               Labs:  CBC: Recent Labs    06/20/17 0331 06/21/17 0346 06/23/17 0514 06/26/17 0601  WBC 8.4 8.9 7.9 6.4  HGB 13.2 13.5 12.3* 13.5  HCT 37.9* 39.1 34.3* 38.3*  PLT 147* 135* 140* 211    COAGS: Recent Labs  06/19/17 1917  INR 1.23  APTT 32    BMP: Recent Labs    06/21/17 1235 06/22/17 0355 06/23/17 0514 06/26/17 0601  NA 137 137 140 137  K 4.0 3.8 3.5 3.6  CL 105 104 108 103  CO2 24 23 19* 24  GLUCOSE 112* 113* 93 112*  BUN 7 7 8 16   CALCIUM 8.7* 8.7* 8.8* 8.7*  CREATININE 0.64 0.67 0.60* 0.60*  GFRNONAA >60 >60 >60 >60  GFRAA >60 >60 >60 >60    LIVER FUNCTION TESTS: Recent Labs    06/19/17 1917 06/26/17 0601  BILITOT 0.6 1.4*  AST 30 48*  ALT 19 23  ALKPHOS 43 47  PROT 6.3* 6.0*  ALBUMIN 3.8 3.0*    Assessment and Plan: Dysphagia IR aware of plans for gastrostomy tube placement.  Patient with apparent oral thrush. Getting treatment and aggressive oral care.  Reassessed today and still with thick layer of white plaque.  Discussed with patient family. Will re-assess early next week to work towards placement in IR.   Electronically Signed: Hoyt Koch, PA 06/30/2017, 1:47 PM   I spent a total of 15 Minutes at the the patient's  bedside AND on the patient's hospital floor or unit, greater than 50% of which was counseling/coordinating care for dysphagia.

## 2017-06-30 NOTE — Progress Notes (Signed)
Physical Therapy Weekly Progress Note  Patient Details  Name: Ronald [name] MRN: [id number] Date of Birth: [birth year]  Beginning of progress report period: June 24, 2017 End of progress report period: June 30, 2017  Today's Date: 06/30/2017 PT Individual Time: 1005-1100 AND 4098-1191 PT Individual Time Calculation (min): 55 min AND 30 min   Patient has met 3 of 3 short term goals. Pt making good progress towards long term goals. Currently functioning at a min-supervision assist level due to continued L side neglect and decreased safety awareness.   Patient continues to demonstrate the following deficits muscle weakness and muscle joint tightness, impaired timing and sequencing, ataxia and decreased coordination, decreased attention to left and left side neglect, decreased attention, decreased awareness, decreased problem solving, decreased safety awareness, decreased memory and delayed processing and decreased standing balance, decreased postural control, hemiplegia and decreased balance strategies and therefore will continue to benefit from skilled PT intervention to increase functional independence with mobility.  Patient progressing toward long term goals..  Continue plan of care.  PT Short Term Goals Week 1:  PT Short Term Goal 1 (Week 1): Pt will ambulate 150' with LRAD and min assist PT Short Term Goal 1 - Progress (Week 1): Met PT Short Term Goal 2 (Week 1): Pt will negotiate 4 steps with 1 rail and min assist PT Short Term Goal 2 - Progress (Week 1): Met PT Short Term Goal 3 (Week 1): Pt will complete standardized balance assessment.  PT Short Term Goal 3 - Progress (Week 1): Met Week 2:  PT Short Term Goal 1 (Week 2): STG =LTG due to ELOS  Skilled Therapeutic Interventions/Progress Updates:      Pt received supine in bed and agreeable to PT. Supine>sit transfer with supervision Gait without AD x 138ft, 118ft and 247ft with supervision assist fom PT. Marland Kitchen   [employer] forced  use of the LUE 4 bouts on program A, 60 sec. Significant difficulty locating target on the L.   PT instructed pt in Berg balance test. Patient demonstrates increased fall risk as noted by score of   38/56 on Berg Balance Scale.  (<36= high risk for falls, close to 100%; 37-45 significant >80%; 46-51 moderate >50%; 52-55 lower >25%)  Stair negotiation training x 12 with 1 rail and supervision assist for safety.   Pt returned to room and performed ambulatory transfer to bed with min assist . Sit>supine completed with supervision assist. Pt left supine in bed with call bell in reach and all needs met.   Session 2.   Pt received supine in bed and agreeable to PT. Supine>sit transfer with supevision assist.   Gait training with min-supervision assist to [employer] and back to west wing ~1268ft. One prolonged rest break in [place] tower waiting room.   Standing balance with lateral reach to the L to play horse shoes. Supervision assist and min cues for LE placement for safety.   Pt returned to room and performed ambulatory transfer to bed with min assist. Sit>supine completed with supervision assist, and left supine in bed with call bell in reach and all needs met.      Therapy Documentation Precautions:  Precautions Precautions: Fall Precaution Comments: NPO  NG tube, left neglect/visual field deficit, global aphasia Restrictions Weight Bearing Restrictions: No   Pain: Pain Assessment Pain Assessment: No/denies pain Pain Score: 0-No pain Faces Pain Scale: No hurt :    Balance: Standardized Balance Assessment Standardized Balance Assessment: Programmer, systems Test Berg Balance Test Sit  to Stand: Able to stand  independently using hands Standing Unsupported: Able to stand 2 minutes with supervision Sitting with Back Unsupported but Feet Supported on Floor or Stool: Able to sit safely and securely 2 minutes Stand to Sit: Controls descent by using hands Transfers: Able to transfer  safely, definite need of hands Standing Unsupported with Eyes Closed: Able to stand 3 seconds Standing Ubsupported with Feet Together: Able to place feet together independently and stand for 1 minute with supervision From Standing, Reach Forward with Outstretched Arm: Can reach confidently >25 cm (10") From Standing Position, Pick up Object from Floor: Able to pick up shoe, needs supervision From Standing Position, Turn to Look Behind Over each Shoulder: Looks behind one side only/other side shows less weight shift Turn 360 Degrees: Able to turn 360 degrees safely one side only in 4 seconds or less Standing Unsupported, Alternately Place Feet on Step/Stool: Able to complete >2 steps/needs minimal assist Standing Unsupported, One Foot in Front: Able to take small step independently and hold 30 seconds Standing on One Leg: Tries to lift leg/unable to hold 3 seconds but remains standing independently Total Score: 38   See Function Navigator for Current Functional Status.  Therapy/Group: Individual Therapy  Golden Pop 06/30/2017, 11:11 AM

## 2017-06-30 NOTE — Progress Notes (Signed)
Subjective/Complaints: Discussed PEG placement with interventional radiology.  Patient has whitish oral plaques, IR is reluctant to place via pull-through technique unless this is cleaned out.  Have started Diflucan, 4 times daily oral care with Magic mouth wash and toothette   Review of systems: Difficult to obtain secondary to severe dysarthria, appears to suggest right shoulder pain  Objective: Vital Signs: Blood pressure (!) 161/72, pulse (!) 57, temperature 98.8 F (37.1 C), temperature source Oral, resp. rate 18, height 5\' 11"  (1.803 m), weight 72.2 kg (159 lb 2.8 oz), SpO2 97 %. Ct Abdomen Wo Contrast  Result Date: 06/29/2017 CLINICAL DATA:  Patient with dysphagia.  Evaluate for G-tube. EXAM: CT ABDOMEN WITHOUT CONTRAST TECHNIQUE: Multidetector CT imaging of the abdomen was performed following the standard protocol without IV contrast. COMPARISON:  CT abdomen 09/05/2005 FINDINGS: Lower chest: Heart is enlarged. Small left pleural effusion. Ground-glass and consolidative opacities left lower lobe. Atelectasis right lower lobe. Hepatobiliary: Liver is low in attenuation compatible with steatosis. Stones within the gallbladder lumen. No intrahepatic or extrahepatic biliary ductal dilatation. Pancreas: Unremarkable Spleen: Unremarkable Adrenals/Urinary Tract: Normal adrenal glands. Kidneys are symmetric in size. No hydronephrosis. Stomach/Bowel: Enteric tube is demonstrated coursing to the third portion of the duodenum. Oral contrast material within the small bowel. Normal morphology of the stomach. 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 No retroperitoneal adenopathy. Peripheral calcified atherosclerotic plaque of the abdominal aorta. Other: None. Musculoskeletal: Thoracic and lumbar spine degenerative changes. No aggressive or acute appearing osseous lesions. IMPRESSION: 1. No acute process within the abdomen. 2. Small left pleural effusion and underlying  opacities favored to represent atelectasis. Infection not excluded. Electronically Signed   By: Annia Belt M.D.   On: 06/29/2017 13:11   Dg Swallowing Func-speech Pathology  Result Date: 06/29/2017 Objective Swallowing Evaluation: Type of Study: Bedside Swallow Evaluation  Patient Details Name: Ronald [name] MRN: [id number] Date of Birth: Aug 06, 1940 Today's Date: 06/29/2017 Time: SLP Start Time (ACUTE ONLY): 1133 -SLP Stop Time (ACUTE ONLY): 1200 SLP Time Calculation (min) (ACUTE ONLY): 27 min Past Medical History: Past Medical History: Diagnosis Date . COPD (chronic obstructive pulmonary disease) (HCC)  . Emphysema lung (HCC)  . Hx of long term use of blood thinners  . Hypertension  . Stroke [facility]) 2013 . Thyroid disease   hypothyroidism Past Surgical History: Past Surgical History: Procedure Laterality Date . IR ANGIO EXTRACRAN SEL COM CAROTID INNOMINATE UNI R MOD SED  06/19/2017 . IR ANGIO VERTEBRAL SEL SUBCLAVIAN INNOMINATE UNI R MOD SED  06/19/2017 . RADIOLOGY WITH ANESTHESIA N/A 06/19/2017  Procedure: RADIOLOGY WITH ANESTHESIA;  Surgeon: Julieanne Cotton, MD;  Location: [facility] OR;  Service: Radiology;  Laterality: N/A; HPI: Ronald L Burnsis a 77 y.o.malewith PMH of AFIB on Coumadin with subtherapeutic INR, Hx of CVA, HTN and Hypothyroidism who presentswith acute left-sided weakness and questionable M2 occlusion with improvement following IV TPA.Large region of acute/early subacute infarction involving right MCA and PCA territories. Involvement of right MCA and PCA  No Data Recorded Assessment / Plan / Recommendation CHL IP CLINICAL IMPRESSIONS 06/29/2017 Clinical Impression Pt is at EXTREMELY HIGH risk for aspiration and should REMAIN NPO with consideration of long term menas of alternate nutrition. Of note, pt with previous CVA and nonspecific report of being on altered diet in the past. However in the most immediate present prior to this admission, pt was consuming regular with thin liquids. Unknown if he  had overt s/s of aspiration with consumption. On this date, pt demonstrates severe sensorimotor oropharyngeal  dysphagia. Pt with severe oral phase deficits with honey thick liquids, nectar thick liquids BY SPOON only and puree. For all consistencies administered, pt has severely decreased lingual manipulation of bolus, no bolus cohesion which left widespread residue throughout oral cavity (under tongue, anteriorally and laterally). Pt required Total A multimodal cues to move bolus posteriorally and demonstrated premature spillage and piecemeal swallowing as a result of decreased oral containment of bolus. Pt with severely delayed swallow initiation at pyriform sinuses after each bolus remained in pyriform sinuses for several seconds. Pt with consistent penetration during swallow with downward motion of penetration but witnessed ejection. Further pt exhibited frank aspiration after the swallow of anterior and posterior residue. Although pt had intermittent delayed cough, it was completely ineffective any clearing any penetrates or aspiriated material. Pt with severe motor deficits as evidenced by widespread residue throughout pharynx. Even with Total A multimodal cues to produce repeat swallows, it took pt at least 7 swallows to effectively reduce pharyngeal residue to mild amount. Despite complete attempts by pt, SLP removed 1/2 of bolus from mouth in order to administer another trial of different consistency. At end of study, pt required SLP to remove moderate amount of oral residue. Recommend pt continuing STRICT NPO with no further trials until another instrumental sutdy demonstrates increased airway protection. Recommend long term alternate means of nutrition in the setting of severe acute on chronic oropharyngeal dysphagia. Would target base of tongue strengthening and swallow initiation with thermal stimulation.  SLP Visit Diagnosis Aphasia (R47.01);Dysarthria and anarthria (R47.1);Dysphagia, oropharyngeal  phase (R13.12) Attention and concentration deficit following -- Frontal lobe and executive function deficit following -- Impact on safety and function Severe aspiration risk;Risk for inadequate nutrition/hydration   CHL IP TREATMENT RECOMMENDATION 06/29/2017 Treatment Recommendations Therapy as outlined in treatment plan below   Prognosis 06/21/2017 Prognosis for Safe Diet Advancement Fair Barriers to Reach Goals Severity of deficits Barriers/Prognosis Comment -- CHL IP DIET RECOMMENDATION 06/29/2017 SLP Diet Recommendations Alternative means - long-term;NPO Liquid Administration via -- Medication Administration Via alternative means Compensations -- Postural Changes --   CHL IP OTHER RECOMMENDATIONS 06/29/2017 Recommended Consults -- Oral Care Recommendations Oral care QID Other Recommendations --   CHL IP FOLLOW UP RECOMMENDATIONS 06/22/2017 Follow up Recommendations Inpatient Rehab   CHL IP FREQUENCY AND DURATION 06/21/2017 Speech Therapy Frequency (ACUTE ONLY) min 3x week Treatment Duration --      CHL IP ORAL PHASE 06/29/2017 Oral Phase Impaired Oral - Pudding Teaspoon -- Oral - Pudding Cup -- Oral - Honey Teaspoon Right anterior bolus loss;Weak lingual manipulation;Lingual pumping;Incomplete tongue to palate contact;Reduced posterior propulsion;Left pocketing in lateral sulci;Left anterior bolus loss;Pocketing in anterior sulcus;Lingual/palatal residue;Piecemeal swallowing;Decreased bolus cohesion;Premature spillage;Delayed oral transit Oral - Honey Cup -- Oral - Nectar Teaspoon Left anterior bolus loss;Right anterior bolus loss;Weak lingual manipulation;Lingual pumping;Incomplete tongue to palate contact;Reduced posterior propulsion;Left pocketing in lateral sulci;Piecemeal swallowing;Delayed oral transit;Pocketing in anterior sulcus;Decreased bolus cohesion;Lingual/palatal residue Oral - Nectar Cup -- Oral - Nectar Straw -- Oral - Thin Teaspoon -- Oral - Thin Cup -- Oral - Thin Straw -- Oral - Puree Left anterior  bolus loss;Right anterior bolus loss;Impaired mastication;Weak lingual manipulation;Lingual pumping;Incomplete tongue to palate contact;Reduced posterior propulsion;Left pocketing in lateral sulci;Pocketing in anterior sulcus;Lingual/palatal residue;Piecemeal swallowing;Delayed oral transit;Decreased bolus cohesion;Premature spillage Oral - Mech Soft -- Oral - Regular -- Oral - Multi-Consistency -- Oral - Pill -- Oral Phase - Comment Total A multimodal cues were given and none were effective in changing severe oral phase deficits  CHL IP PHARYNGEAL  PHASE 06/29/2017 Pharyngeal Phase Impaired Pharyngeal- Pudding Teaspoon -- Pharyngeal -- Pharyngeal- Pudding Cup -- Pharyngeal -- Pharyngeal- Honey Teaspoon Delayed swallow initiation-pyriform sinuses;Reduced pharyngeal peristalsis;Reduced airway/laryngeal closure;Reduced tongue base retraction;Moderate aspiration;Significant aspiration (Amount);Pharyngeal residue - valleculae;Pharyngeal residue - pyriform;Lateral channel residue;Inter-arytenoid space residue;Penetration/Aspiration during swallow;Penetration/Apiration after swallow;Pharyngeal residue - cp segment;Pharyngeal residue - posterior pharnyx Pharyngeal Material enters airway, passes BELOW cords and not ejected out despite cough attempt by patient;Other (Comment) Pharyngeal- Honey Cup -- Pharyngeal -- Pharyngeal- Nectar Teaspoon Delayed swallow initiation-vallecula;Delayed swallow initiation-pyriform sinuses;Reduced pharyngeal peristalsis;Reduced tongue base retraction;Reduced airway/laryngeal closure;Penetration/Aspiration during swallow;Penetration/Apiration after swallow;Significant aspiration (Amount);Pharyngeal residue - valleculae;Pharyngeal residue - pyriform;Pharyngeal residue - posterior pharnyx;Pharyngeal residue - cp segment;Inter-arytenoid space residue;Lateral channel residue;Moderate aspiration Pharyngeal Material enters airway, passes BELOW cords and not ejected out despite cough attempt by patient  Pharyngeal- Nectar Cup -- Pharyngeal -- Pharyngeal- Nectar Straw -- Pharyngeal -- Pharyngeal- Thin Teaspoon -- Pharyngeal -- Pharyngeal- Thin Cup -- Pharyngeal -- Pharyngeal- Thin Straw -- Pharyngeal -- Pharyngeal- Puree Delayed swallow initiation-vallecula;Delayed swallow initiation-pyriform sinuses;Reduced pharyngeal peristalsis;Reduced airway/laryngeal closure;Reduced tongue base retraction;Penetration/Aspiration during swallow;Penetration/Apiration after swallow;Moderate aspiration;Significant aspiration (Amount);Pharyngeal residue - valleculae;Pharyngeal residue - pyriform;Pharyngeal residue - posterior pharnyx;Pharyngeal residue - cp segment;Inter-arytenoid space residue;Lateral channel residue Pharyngeal Material enters airway, passes BELOW cords and not ejected out despite cough attempt by patient Pharyngeal- Mechanical Soft -- Pharyngeal -- Pharyngeal- Regular -- Pharyngeal -- Pharyngeal- Multi-consistency -- Pharyngeal -- Pharyngeal- Pill -- Pharyngeal -- Pharyngeal Comment although pt displays INTERMITTENT delayed cough, not effective in clearing apiration and requires 6 to 7 multiple swallows to decrease residue throughout pharynx  CHL IP CERVICAL ESOPHAGEAL PHASE 06/29/2017 Cervical Esophageal Phase WFL Pudding Teaspoon -- Pudding Cup -- Honey Teaspoon -- Honey Cup -- Nectar Teaspoon -- Nectar Cup -- Nectar Straw -- Thin Teaspoon -- Thin Cup -- Thin Straw -- Puree -- Mechanical Soft -- Regular -- Multi-consistency -- Pill -- Cervical Esophageal Comment -- No flowsheet data found. Happi Overton 06/29/2017, 1:02 PM              Results for orders placed or performed during the hospital encounter of 06/23/17 (from the past 72 hour(s))  Glucose, capillary     Status: Abnormal   Collection Time: 06/28/17  6:27 AM  Result Value Ref Range   Glucose-Capillary 110 (H) 65 - 99 mg/dL  Glucose, capillary     Status: Abnormal   Collection Time: 06/29/17  6:25 AM  Result Value Ref Range   Glucose-Capillary  122 (H) 65 - 99 mg/dL  Glucose, capillary     Status: None   Collection Time: 06/30/17  6:54 AM  Result Value Ref Range   Glucose-Capillary 98 65 - 99 mg/dL   Comment 1 Notify RN      HEENT: Normocephalic, atraumatic. +NG Cardio: irregularly irregular and no JVD. Resp: CTA B/L and unlabored, occasional crackles left base GI: BS positive and ND Skin:   Scattered bruises Neuro: Limited due to severe dysarthria and aphasia Motor:  4/5 on Left sided 5/5 on right side Musc/Skel:  No edema. No tenderness.  No pain with PROM right shoulder Gen NAD. Vital signs reviewed  Assessment/Plan: 1. 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 Physiatrist is providing close team supervision and 24 hour management of active medical problems listed below. Physiatrist and rehab team  continue to assess barriers to discharge/monitor patient progress toward functional and medical goals. FIM: Function - Bathing Position: Shower Body parts bathed by patient: Left arm, Chest, Abdomen, Front perineal area, Buttocks, Right upper leg, Left upper leg, Right lower leg, Left lower leg, Right arm Body parts bathed by helper: Back Assist Level: Touching or steadying assistance(Pt > 75%)  Function- Upper Body Dressing/Undressing What is the patient wearing?: Pull over shirt/dress Pull over shirt/dress - Perfomed by patient: Thread/unthread right sleeve, Put head through opening, Pull shirt over trunk, Thread/unthread left sleeve Pull over shirt/dress - Perfomed by helper: Thread/unthread left sleeve Button up shirt - Perfomed by patient: Thread/unthread right sleeve Button up shirt - Perfomed by helper: Thread/unthread left sleeve, Pull shirt around  back Assist Level: Supervision or verbal cues Function - Lower Body Dressing/Undressing What is the patient wearing?: Socks, Shoes, Pants, Underwear Position: Wheelchair/chair at [employer] - Performed by patient: Thread/unthread right underwear leg, Thread/unthread left underwear leg, Pull underwear up/down Underwear - Performed by helper: Pull underwear up/down Pants- Performed by patient: Thread/unthread right pants leg, Thread/unthread left pants leg, Pull pants up/down Pants- Performed by helper: Pull pants up/down Socks - Performed by helper: Don/doff right sock, Don/doff left sock Shoes - Performed by patient: Don/doff right shoe, Don/doff left shoe(Slip ons) Assist for footwear: Supervision/touching assist Assist for lower body dressing: Touching or steadying assistance (Pt > 75%)  Function - Toileting Toileting steps completed by patient: Adjust clothing prior to toileting, Performs perineal hygiene, Adjust clothing after toileting Toileting steps completed by helper: Adjust clothing after toileting Toileting Assistive Devices: Grab bar or rail Assist level: Supervision or verbal cues  Function - Archivist transfer assistive device: Elevated toilet seat/BSC over toilet Assist level to toilet: Touching or steadying assistance (Pt > 75%) Assist level from toilet: Touching or steadying assistance (Pt > 75%)  Function - Chair/bed transfer Chair/bed transfer method: Ambulatory Chair/bed transfer assist level: Supervision or verbal cues Chair/bed transfer assistive device: Bedrails, Armrests Chair/bed transfer details: Verbal cues for precautions/safety, Verbal cues for technique, Verbal cues for sequencing  Function - Locomotion: Wheelchair Will patient use wheelchair at discharge?: No Function - Locomotion: Ambulation Assistive device: Walker-rolling Max distance: 251ft  Assist level: Touching or steadying assistance (Pt > 75%) Assist level: Touching or  steadying assistance (Pt > 75%) Assist level: Touching or steadying assistance (Pt > 75%) Assist level: Touching or steadying assistance (Pt > 75%) Walk 10 feet on uneven surfaces activity did not occur: Safety/medical concerns Assist level: Touching or steadying assistance (Pt > 75%)  Function - Comprehension Comprehension: Auditory Comprehension assist level: Understands basic 50 - 74% of the time/ requires cueing 25 - 49% of the time, Understands basic 25 - 49% of the time/ requires cueing 50 - 75% of the time  Function - Expression Expression: Verbal, Nonverbal Expression assist level: Expresses basis less than 25% of the time/requires cueing >75% of the time.  Function - Social Interaction Social Interaction assist level: Interacts appropriately less than 25% of the time. May be withdrawn or combative., Interacts appropriately 25 - 49% of time - Needs frequent redirection.  Function - Problem Solving Problem solving assist level: Solves basic less than 25% of the time - needs direction nearly all the time or does not effectively solve problems and may need a restraint for safety  Function - Memory Memory assist level: Recognizes or recalls less than 25% of the time/requires cueing greater than 75% of the time Patient normally able to recall (first  3 days only): That he or she is in a hospital, Location of own room, Staff names and faces  Medical Problem List and Plan: 1.Left side weaknesssecondary to right temporoparietal infarct status post TPA with hemorrhagic conversion as well as history of left MCA infarction with residual aphasia Cont CIR  Daughter aware of discharge date 07/08/2017 2. DVT Prophylaxis/Anticoagulation: SCD's only given hemorrhage.Monitor for signs of DVT -Dopplers negative 3. Pain Management:Tylenol as needed   Voltaren gel ordered for right shoulder 4. Mood:Provide emotional support 5. Neuropsych: This patientiscapable of  making decisions on hisown behalf. 6. Skin/Wound Care:Routine skin checks 7. Fluids/Electrolytes/Nutrition:Routine I&Os   BMP within acceptable range on 2/25 8. Dysphagia.NPO. Cortrakfor nutritional support.  Repeat modified barium swallow, no improvement with swallowing. 9.Aspiration pneumonia. Completed course of Unasyn -monitor for recurrent sx, no signs of respiratory distress 10.Hypothyroidism. Synthroid 11.Hyperlipidemia.Lipitor 12.Atrial fibrillation. Cardiac rate controlled. Chronic Coumadin on hold d/t hemorrhage.  Continue ASA 81mg , repeat CT scan once plan for PEG is determined 13.Tobacco abuse. Counseling 14.  Hypoalbuminemia start pro-stat 15. HTN    Vitals:   06/29/17 1500 06/30/17 0500  BP: (!) 142/72 (!) 161/72  Pulse: 78 (!) 57  Resp: 18 18  Temp: 98.5 F (36.9 C) 98.8 F (37.1 C)  SpO2: 99% 97%     Lisinopril 5 started on 2/27, will increase to 10 mg on 06/30/2017   LOS (Days) 7 A FACE TO FACE EVALUATION WAS PERFORMED  [name] 06/30/2017, 8:51 AM

## 2017-06-30 NOTE — Progress Notes (Signed)
Physical Therapy Session Note  Patient Details  Name: Ronald [name] MRN: [id number] Date of Birth: 09/04/1940  Today's Date: 06/29/2017 PT Individual Time:1505-1600   55min   Short Term Goals: Week 1:  PT Short Term Goal 1 (Week 1): Pt will ambulate 150' with LRAD and min assist PT Short Term Goal 2 (Week 1): Pt will negotiate 4 steps with 1 rail and min assist PT Short Term Goal 3 (Week 1): Pt will complete standardized balance assessment.   Skilled Therapeutic Interventions/Progress Updates:   Pt received supine in bed and agreeable to PT. Supine>sit transfer with supervision assist. Pt reports need to toileting. Min assist for ambulatory transfer to and from toilet as well as for hand hygiene. Gait training without AD x 150ft, 180ft, and 140ft with min assist and min cues for attention to the L side. Dynamic gait training to step over 6 obstacles (1-3 inches) with min assist and moderate cues for gait pattern and safety. X 4. Kinetron reciprocal movement training. 4 x 45sec in sitting at 30cm/sec and 3 x 45sec in standing at 20cm/sec BUE support and min assist from PT safety. Patient returned to room and left sitting in WC with call bell in reach and all needs met.         Therapy Documentation Precautions:  Precautions Precautions: Fall Precaution Comments: NPO  NG tube, left neglect/visual field deficit, global aphasia Restrictions Weight Bearing Restrictions: No Vital Signs: Therapy Vitals Temp: 98.8 F (37.1 C) Temp Source: Oral Pulse Rate: (!) 57 Resp: 18 BP: (!) 161/72 Patient Position (if appropriate): Lying Oxygen Therapy SpO2: 97 % O2 Device: Room Air Pain: Pain Assessment Pain Score: denies.    See Function Navigator for Current Functional Status.   Therapy/Group: Individual Therapy   E  06/30/2017, 6:27 AM  

## 2017-07-01 ENCOUNTER — Inpatient Hospital Stay (HOSPITAL_COMMUNITY): Payer: Self-pay | Admitting: Physical Therapy

## 2017-07-01 ENCOUNTER — Inpatient Hospital Stay (HOSPITAL_COMMUNITY): Payer: Self-pay | Admitting: Occupational Therapy

## 2017-07-01 LAB — GLUCOSE, CAPILLARY
Glucose-Capillary: 110 mg/dL — ABNORMAL HIGH (ref 65–99)
Glucose-Capillary: 88 mg/dL (ref 65–99)
Glucose-Capillary: 85 mg/dL (ref 65–99)
Glucose-Capillary: 115 mg/dL — ABNORMAL HIGH (ref 65–99)

## 2017-07-01 MED ORDER — CHLORHEXIDINE GLUCONATE 0.12 % MT SOLN
15.0000 mL | Freq: Four times a day (QID) | OROMUCOSAL | Status: DC
  Administered 2017-07-01 – 2017-07-08 (×27): 15 mL via OROMUCOSAL
  Filled 2017-07-01 (×27): qty 15

## 2017-07-01 MED ORDER — ORAL CARE MOUTH RINSE
15.0000 mL | OROMUCOSAL | Status: DC
  Administered 2017-07-01 – 2017-07-02 (×11): 15 mL via OROMUCOSAL

## 2017-07-01 MED ORDER — ORAL CARE MOUTH RINSE: 15.0000 mL | Freq: Two times a day (BID) | OROMUCOSAL | Status: DC

## 2017-07-01 MED ORDER — CHLORHEXIDINE GLUCONATE 0.12 % MT SOLN: 15.0000 mL | Freq: Two times a day (BID) | OROMUCOSAL | Status: DC

## 2017-07-01 NOTE — Progress Notes (Signed)
Occupational Therapy Session Note  Patient Details  Name: Ronald [name] MRN: [id number] Date of Birth: 01/03/1941  Today's Date: 07/01/2017 OT Individual Time: 0800-0900 OT Individual Time Calculation (min): 60 min    Short Term Goals: Week 2:     Skilled Therapeutic Interventions/Progress Updates: Patient tolerated oral care education by RN during this session & exhibited no gag reflex at all when she rubbed toothbrush down and back middle of tongue.    Respiratory therapist came in during session as requested by RN.   Patient coughed throghout session but was not able to 'bring up' and spit out any secretions.  Otherwise, patient participated in toileting(close S) and toilet transfer with close S and requests to slow down as this clinician followed close behind as he walked with his rolling walker.  He requested constant reminders to not pick up his rolling walker as he walked.    Otherwise, patient tolerated endurance activities well with coughing throughout the session.  He requested to ly back onto the bed and rest at session's end.   His call bell and phone were within reach.     Therapy Documentation Precautions:  Precautions Precautions: Fall Precaution Comments: NPO  NG tube, left neglect/visual field deficit, global aphasia Restrictions Weight Bearing Restrictions: No Pain: touched his right upper back area.   Did not rate.   RN gave pain meds    Therapy/Group: Individual Therapy  Bud Faceickett, Ronald [name] [facility] 07/01/2017, 12:19 PM

## 2017-07-01 NOTE — Progress Notes (Signed)
Subjective/Complaints: No oral pain.  Discussed with nursing has been able to eliminate white plaque on left posterior pharynx.  Discussed with speech therapy working on Engineer, site.  May benefit from a tongue scraper   Review of systems: Difficult to obtain secondary to severe dysarthria, appears to suggest right shoulder pain  Objective: Vital Signs: Blood pressure (!) 152/72, pulse 85, temperature 97.7 F (36.5 C), temperature source Axillary, resp. rate 18, height 5\' 11"  (1.803 m), weight 72.2 kg (159 lb 2.8 oz), SpO2 100 %. Ct Abdomen Wo Contrast  Result Date: 06/29/2017 CLINICAL DATA:  Patient with dysphagia.  Evaluate for G-tube. EXAM: CT ABDOMEN WITHOUT CONTRAST TECHNIQUE: Multidetector CT imaging of the abdomen was performed following the standard protocol without IV contrast. COMPARISON:  CT abdomen 09/05/2005 FINDINGS: Lower chest: Heart is enlarged. Small left pleural effusion. Ground-glass and consolidative opacities left lower lobe. Atelectasis right lower lobe. Hepatobiliary: Liver is low in attenuation compatible with steatosis. Stones within the gallbladder lumen. No intrahepatic or extrahepatic biliary ductal dilatation. Pancreas: Unremarkable Spleen: Unremarkable Adrenals/Urinary Tract: Normal adrenal glands. Kidneys are symmetric in size. No hydronephrosis. Stomach/Bowel: Enteric tube is demonstrated coursing to the third portion of the duodenum. Oral contrast material within the small bowel. Normal morphology of the stomach. 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 No retroperitoneal adenopathy. Peripheral calcified atherosclerotic plaque of the abdominal aorta. Other: None. Musculoskeletal: Thoracic and lumbar spine degenerative changes. No aggressive or acute appearing osseous lesions. IMPRESSION: 1. No acute process within the abdomen. 2. Small left pleural effusion and underlying opacities favored to represent atelectasis. Infection not  excluded. Electronically Signed   By: Annia Belt M.D.   On: 06/29/2017 13:11   Results for orders placed or performed during the hospital encounter of 06/23/17 (from the past 72 hour(s))  Glucose, capillary     Status: Abnormal   Collection Time: 06/29/17  6:25 AM  Result Value Ref Range   Glucose-Capillary 122 (H) 65 - 99 mg/dL  Glucose, capillary     Status: None   Collection Time: 06/30/17  6:54 AM  Result Value Ref Range   Glucose-Capillary 98 65 - 99 mg/dL   Comment 1 Notify RN   Glucose, capillary     Status: None   Collection Time: 06/30/17 11:48 AM  Result Value Ref Range   Glucose-Capillary 94 65 - 99 mg/dL  Glucose, capillary     Status: None   Collection Time: 06/30/17  4:37 PM  Result Value Ref Range   Glucose-Capillary 97 65 - 99 mg/dL  Glucose, capillary     Status: Abnormal   Collection Time: 06/30/17  9:11 PM  Result Value Ref Range   Glucose-Capillary 110 (H) 65 - 99 mg/dL   Comment 1 Notify RN   Glucose, capillary     Status: Abnormal   Collection Time: 07/01/17  6:44 AM  Result Value Ref Range   Glucose-Capillary 110 (H) 65 - 99 mg/dL   Comment 1 Notify RN   Glucose, capillary     Status: None   Collection Time: 07/01/17 11:30 AM  Result Value Ref Range   Glucose-Capillary 88 65 - 99 mg/dL     HEENT: Normocephalic, atraumatic.  Whitish plaque on tongue, no plaques in the pharyngeal area, no plaques in buccal area Cardio: irregularly irregular and no JVD. Resp: CTA B/L and unlabored, occasional crackles left base GI: BS positive and ND Skin:   Scattered bruises Neuro: Limited due to severe dysarthria and aphasia Motor:  4/5 on  Left sided 5/5 on right side Musc/Skel:  No edema. No tenderness.  No pain with PROM right shoulder Gen NAD. Vital signs reviewed  Assessment/Plan: 1. 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 Physiatrist is providing close team supervision and 24 hour management of active medical problems listed below. Physiatrist and rehab team continue to assess barriers to discharge/monitor patient progress toward functional and medical goals. FIM: Function - Bathing Position: Shower Body parts bathed by patient: Left arm, Chest, Abdomen, Front perineal area, Buttocks, Right upper leg, Left upper leg, Right lower leg, Left lower leg, Right arm Body parts bathed by helper: Back Assist Level: Supervision or verbal cues  Function- Upper Body Dressing/Undressing What is the patient wearing?: Pull over shirt/dress Pull over shirt/dress - Perfomed by patient: Thread/unthread right sleeve, Put head through opening, Pull shirt over trunk, Thread/unthread left sleeve Pull over shirt/dress - Perfomed by helper: Thread/unthread left sleeve Button up shirt - Perfomed by patient: Thread/unthread right sleeve Button up shirt - Perfomed by helper: Thread/unthread left sleeve, Pull shirt around back Assist Level: Supervision or verbal cues Function - Lower Body Dressing/Undressing What is the patient wearing?: Socks, Shoes, Pants, Underwear Position: Sitting EOB Underwear - Performed by patient: Thread/unthread right underwear leg, Thread/unthread left underwear leg, Pull underwear up/down Underwear - Performed by helper: Pull underwear up/down Pants- Performed by patient: Thread/unthread right pants leg, Thread/unthread left pants leg, Pull pants up/down Pants- Performed by helper: Pull pants up/down Socks - Performed by patient: Don/doff right sock, Don/doff left sock Socks - Performed by helper: Don/doff right sock, Don/doff left sock Shoes - Performed by patient: Don/doff right shoe, Don/doff left shoe Assist for footwear: Supervision/touching assist Assist for lower body  dressing: Supervision or verbal cues  Function - Toileting Toileting steps completed by patient: Adjust clothing prior to toileting, Performs perineal hygiene, Adjust clothing after toileting Toileting steps completed by helper: Adjust clothing after toileting Toileting Assistive Devices: Grab bar or rail Assist level: Set up/obtain supplies  Function - ArchivistToilet Transfers Toilet transfer assistive device: Walker, Grab bar Assist level to toilet: Touching or steadying assistance (Pt > 75%) Assist level from toilet: Touching or steadying assistance (Pt > 75%)  Function - Chair/bed transfer Chair/bed transfer method: Ambulatory Chair/bed transfer assist level: Supervision or verbal cues Chair/bed transfer assistive device: Bedrails, Armrests Chair/bed transfer details: Verbal cues for precautions/safety, Verbal cues for technique, Verbal cues for sequencing  Function - Locomotion: Wheelchair Will patient use wheelchair at discharge?: No Function - Locomotion: Ambulation Assistive device: Walker-rolling Max distance: 21900ft  Assist level: Touching or steadying assistance (Pt > 75%) Assist level: Touching or steadying assistance (Pt > 75%) Assist level: Touching or steadying assistance (Pt > 75%) Assist level: Touching or steadying assistance (Pt > 75%) Walk 10 feet on uneven surfaces activity did not occur: Safety/medical concerns Assist level: Touching or steadying assistance (Pt > 75%)  Function - Comprehension Comprehension: Auditory Comprehension assist level: Understands basic 50 - 74% of the time/ requires cueing 25 - 49% of the time  Function - Expression Expression: Verbal Expression assist level: Expresses basic 25 - 49% of the time/requires cueing 50 - 75% of the time. Uses single words/gestures.  Function - Social Interaction Social Interaction assist level: Interacts appropriately 25 - 49% of time - Needs frequent  redirection.  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 DVT Prophylaxis/Anticoagulation: SCD's only given hemorrhage.Monitor for signs of DVT -Dopplers negative 3. Pain Management:Tylenol as needed   Voltaren gel ordered for right shoulder 4. Mood:Provide emotional support 5. Neuropsych: This patientiscapable of making decisions on hisown behalf. 6. Skin/Wound Care:Routine skin checks 7. Fluids/Electrolytes/Nutrition:Routine I&Os   BMP within acceptable range on 2/25 8. Dysphagia.NPO. Cortrakfor nutritional support.  Repeat modified barium swallow, no improvement with swallowing. 9.Aspiration pneumonia. Completed course of Unasyn -monitor for recurrent sx, no signs of respiratory distress 10.Hypothyroidism. Synthroid 11.Hyperlipidemia.Lipitor 12.Atrial fibrillation. Cardiac rate controlled. Chronic Coumadin on hold d/t hemorrhage.  Continue ASA 81mg , repeat CT scan once plan for PEG is determined 13.Tobacco abuse. Counseling 14.  Hypoalbuminemia start pro-stat 15. HTN    Vitals:   06/30/17 1500 07/01/17 0243  BP: (!) 143/72 (!) 152/72  Pulse: 63 85  Resp: 18 18  Temp: 98.6 F (37 C) 97.7 F (36.5 C)  SpO2: 98% 100%     Lisinopril 5 started on 2/27, will increase to 10 mg on 06/30/2017, no further changes for now  16.  Poor oral hygiene  secondary to n.p.o. status and patient's inability to perform oral hygiene.  Do not think this is thrush as lesions would not be removable mechanically. Continue aggressive oral care. LOS (Days) 8 A FACE TO FACE EVALUATION WAS PERFORMED  [name] 07/01/2017, 12:00 PM

## 2017-07-02 ENCOUNTER — Inpatient Hospital Stay (HOSPITAL_COMMUNITY): Payer: Self-pay

## 2017-07-02 LAB — GLUCOSE, CAPILLARY
GLUCOSE-CAPILLARY: 115 mg/dL — AB (ref 65–99)
Glucose-Capillary: 135 mg/dL — ABNORMAL HIGH (ref 65–99)
Glucose-Capillary: 97 mg/dL (ref 65–99)

## 2017-07-02 MED ORDER — ORAL CARE MOUTH RINSE
15.0000 mL | OROMUCOSAL | Status: DC
  Administered 2017-07-02 – 2017-07-08 (×52): 15 mL via OROMUCOSAL

## 2017-07-02 NOTE — Progress Notes (Signed)
Subjective/Complaints:  No issues overnite, discussed oral care with RN  Review of systems: Difficult to obtain secondary to severe dysarthria, appears to suggest right shoulder pain  Objective: Vital Signs: Blood pressure 135/61, pulse 79, temperature 97.6 F (36.4 C), temperature source Oral, resp. rate 18, height 5\' 11"  (1.803 m), weight 72.2 kg (159 lb 2.8 oz), SpO2 97 %. No results found. Results for orders placed or performed during the hospital encounter of 06/23/17 (from the past 72 hour(s))  Glucose, capillary     Status: None   Collection Time: 06/30/17  6:54 AM  Result Value Ref Range   Glucose-Capillary 98 65 - 99 mg/dL   Comment 1 Notify RN   Glucose, capillary     Status: None   Collection Time: 06/30/17 11:48 AM  Result Value Ref Range   Glucose-Capillary 94 65 - 99 mg/dL  Glucose, capillary     Status: None   Collection Time: 06/30/17  4:37 PM  Result Value Ref Range   Glucose-Capillary 97 65 - 99 mg/dL  Glucose, capillary     Status: Abnormal   Collection Time: 06/30/17  9:11 PM  Result Value Ref Range   Glucose-Capillary 110 (H) 65 - 99 mg/dL   Comment 1 Notify RN   Glucose, capillary     Status: Abnormal   Collection Time: 07/01/17  6:44 AM  Result Value Ref Range   Glucose-Capillary 110 (H) 65 - 99 mg/dL   Comment 1 Notify RN   Glucose, capillary     Status: None   Collection Time: 07/01/17 11:30 AM  Result Value Ref Range   Glucose-Capillary 88 65 - 99 mg/dL  Glucose, capillary     Status: None   Collection Time: 07/01/17  5:32 PM  Result Value Ref Range   Glucose-Capillary 85 65 - 99 mg/dL  Glucose, capillary     Status: Abnormal   Collection Time: 07/01/17  9:19 PM  Result Value Ref Range   Glucose-Capillary 115 (H) 65 - 99 mg/dL   Comment 1 Notify RN   Glucose, capillary     Status: Abnormal   Collection Time: 07/02/17  6:37 AM  Result Value Ref Range   Glucose-Capillary 135 (H) 65 - 99 mg/dL   Comment 1 Notify RN      HEENT:  Normocephalic, atraumatic.  Whitish plaque on tongue, no plaques in the pharyngeal area, no plaques in buccal area Cardio: irregularly irregular and no JVD. Resp: CTA B/L and unlabored, occasional crackles left base GI: BS positive and ND Skin:   Scattered bruises Neuro: Limited due to severe dysarthria and aphasia Motor:  4/5 on Left sided 5/5 on right side Musc/Skel:  No edema. No tenderness.  No pain with PROM right shoulder Gen NAD. Vital signs reviewed  Assessment/Plan: 1. 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 Physiatrist is providing close team supervision and 24 hour management of active medical problems listed below. Physiatrist and rehab team continue to assess barriers to discharge/monitor patient progress toward functional and medical goals. FIM: Function - Bathing Position: Shower Body parts bathed by patient: Left arm, Chest, Abdomen, Front perineal area, Buttocks, Right upper leg, Left upper leg, Right lower leg, Left lower leg, Right arm Body parts bathed by helper: Back Assist Level: Supervision or verbal cues  Function- Upper Body Dressing/Undressing What is the patient wearing?: Pull over shirt/dress Pull over shirt/dress - Perfomed by patient: Thread/unthread right sleeve, Put head through opening, Pull shirt over trunk, Thread/unthread left sleeve Pull over shirt/dress - Perfomed by helper: Thread/unthread left sleeve Button up shirt - Perfomed by patient: Thread/unthread right sleeve Button up shirt - Perfomed by helper: Thread/unthread left sleeve, Pull shirt around back Assist Level: Supervision or verbal cues Function - Lower Body Dressing/Undressing What is the patient wearing?: Socks, Shoes,  Pants, Underwear Position: Sitting EOB Underwear - Performed by patient: Thread/unthread right underwear leg, Thread/unthread left underwear leg, Pull underwear up/down Underwear - Performed by helper: Pull underwear up/down Pants- Performed by patient: Thread/unthread right pants leg, Thread/unthread left pants leg, Pull pants up/down Pants- Performed by helper: Pull pants up/down Socks - Performed by patient: Don/doff right sock, Don/doff left sock Socks - Performed by helper: Don/doff right sock, Don/doff left sock Shoes - Performed by patient: Don/doff right shoe, Don/doff left shoe Assist for footwear: Supervision/touching assist Assist for lower body dressing: Supervision or verbal cues  Function - Toileting Toileting steps completed by patient: Adjust clothing prior to toileting Toileting steps completed by helper: Performs perineal hygiene, Adjust clothing after toileting Toileting Assistive Devices: Grab bar or rail Assist level: Touching or steadying assistance (Pt.75%)  Function - Archivist transfer assistive device: Walker, Grab bar Assist level to toilet: Touching or steadying assistance (Pt > 75%) Assist level from toilet: Touching or steadying assistance (Pt > 75%)  Function - Chair/bed transfer Chair/bed transfer method: Ambulatory Chair/bed transfer assist level: Supervision or verbal cues Chair/bed transfer assistive device: Bedrails, Armrests Chair/bed transfer details: Verbal cues for precautions/safety, Verbal cues for technique, Verbal cues for sequencing  Function - Locomotion: Wheelchair Will patient use wheelchair at discharge?: No Function - Locomotion: Ambulation Assistive device: Walker-rolling Max distance: 258ft  Assist level: Touching or steadying assistance (Pt > 75%) Assist level: Touching or steadying assistance (Pt > 75%) Assist level: Touching or steadying assistance (Pt > 75%) Assist level: Touching or steadying assistance (Pt >  75%) Walk 10 feet on uneven surfaces activity did not occur: Safety/medical concerns Assist level: Touching or steadying assistance (Pt > 75%)  Function - Comprehension Comprehension: Auditory Comprehension assist level: Understands basic 50 - 74% of the time/ requires cueing 25 - 49% of the time  Function - Expression Expression: Verbal Expression assist level: Expresses basis less than 25% of the time/requires cueing >75% of the time.  Function - Social Interaction Social Interaction assist level: Interacts appropriately 25 - 49% of time - Needs frequent redirection.  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 DVT Prophylaxis/Anticoagulation: SCD's only given hemorrhage.Monitor for signs of DVT -Dopplers negative 3. Pain Management:Tylenol as needed   Voltaren gel ordered for right shoulder 4. Mood:Provide emotional support 5. Neuropsych: This patientiscapable of  making decisions on hisown behalf. 6. Skin/Wound Care:Routine skin checks 7. Fluids/Electrolytes/Nutrition:Routine I&Os   BMP within acceptable range on 2/25 8. Dysphagia.NPO. Cortrakfor nutritional support.  Repeat modified barium swallow, no improvement with swallowing.  10.Hypothyroidism.  Synthroid 11.Hyperlipidemia.Lipitor 12.Atrial fibrillation. Cardiac rate controlled. Chronic Coumadin on hold d/t hemorrhage.  Continue ASA 81mg , repeat CT scan once plan for PEG is determined 13.Tobacco abuse. Counseling 14.  Hypoalbuminemia start pro-stat 15. HTN    Vitals:   07/01/17 1317 07/02/17 0248  BP: (!) 157/71 135/61  Pulse: 75 79  Resp: 18 18  Temp:  97.6 F (36.4 C)  SpO2: 99% 97%     Lisinopril 5 started on 2/27, will increase to 10 mg on 06/30/2017, no further changes for now  16.  Poor oral hygiene secondary to n.p.o. status and patient's inability to perform oral hygiene.Mouth and pharynx looking much better   Do not think this is thrush as lesions would not be removable mechanically. Continue aggressive oral care. LOS (Days) 9 A FACE TO FACE EVALUATION WAS PERFORMED  [name] 07/02/2017, 8:28 AM

## 2017-07-02 NOTE — Progress Notes (Signed)
Occupational Therapy Session Note  Patient Details  Name: Ronald [name] MRN: [id number] Date of Birth: 04-20-1941  Today's Date: 07/02/2017 OT Individual Time: 0700-0756 OT Individual Time Calculation (min): 56 min    Short Term Goals: Week 1:  OT Short Term Goal 1 (Week 1): Pt will locate 2 bathing/selfcare items left of midline with no more than mod instructional cueing.  OT Short Term Goal 2 (Week 1): Pt will complete UB bathing with supervision in unsupported sitting. OT Short Term Goal 3 (Week 1): Pt will complete LB bathing sit to stand with supervision.  OT Short Term Goal 4 (Week 1): Pt will perform LB dressing with min assist sit to stand.  OT Short Term Goal 5 (Week 1): Pt will complete toilet transfers to elevated toilet or 3:1 with supervision using RW for support.   Skilled Therapeutic Interventions/Progress Updates:    1:1. Pt selects clothing from L visual field by OT while RN unhooking tube feed. Pt ambualtes throughout session with CGA and VC to keep walker on floor. Pt bathes at sit to stand level with MIN VC for locating bathing items on L and supervision for standing balance using grab bar. Pt dresses at sit to stand level with supervision to orient button up shirt and increased time to undo buttons d/t decreased FMC/viusion. Ultimately pt requiers A to button up shirt part way d/t frustration/time contraints. Pt grooms in standing at sink with supervision for balance to apply deodorant and brush hair with items on L with mod VC to locate. Pt ambulates in hallway with RW visual scanning to find room and practice turning around diamonds on floor tile. Pt demo difficulty keeping feet inside walker and unable to locate room on L when passing it and managing walker. Pt requires MAX A to facilitate proper steering of walker and cues to locate room. Pt attempts to enter 2 other pt rooms believing it is his before noticing another person in room. Exited session with pt seated in bed, exit  alarm on and call light in reach  Therapy Documentation Precautions:  Precautions Precautions: Fall Precaution Comments: NPO  NG tube, left neglect/visual field deficit, global aphasia Restrictions Weight Bearing Restrictions: No  See Function Navigator for Current Functional Status.   Therapy/Group: Individual Therapy  Shon HaleStephanie M Vicktoria Muckey 07/02/2017, 7:40 AM

## 2017-07-03 ENCOUNTER — Inpatient Hospital Stay (HOSPITAL_COMMUNITY): Payer: Self-pay | Admitting: Speech Pathology

## 2017-07-03 ENCOUNTER — Inpatient Hospital Stay (HOSPITAL_COMMUNITY): Payer: Self-pay | Admitting: Occupational Therapy

## 2017-07-03 ENCOUNTER — Inpatient Hospital Stay (HOSPITAL_COMMUNITY): Payer: Self-pay | Admitting: Physical Therapy

## 2017-07-03 ENCOUNTER — Inpatient Hospital Stay (HOSPITAL_COMMUNITY): Payer: Medicare Other

## 2017-07-03 LAB — GLUCOSE, CAPILLARY: Glucose-Capillary: 116 mg/dL — ABNORMAL HIGH (ref 65–99)

## 2017-07-03 NOTE — Progress Notes (Signed)
Occupational Therapy Weekly Progress Note  Patient Details  Name: Ronald [name] MRN: [id number] Date of Birth: May 23, 1940  Beginning of progress report period: June 24, 2017 End of progress report period: July 03, 2017  Today's Date: 07/03/2017 OT Individual Time: 1000-1100 OT Individual Time Calculation (min): 60 min    Patient has met 5 of 5 short term goals.  Pt is making steady progress towards OT goals. He is completing ADLs at overall min A level, however, can require up to max A cuing (multimodal) for safety, L attention and navigating environmental obstacles in functional setting. .   Patient continues to demonstrate the following deficits: abnormal posture, altered mental status, apraxia, cognitive deficits, hemiplegia affecting non-dominant side and muscle weakness (generalized) and therefore will continue to benefit from skilled OT intervention to enhance overall performance with BADL and Reduce care partner burden.  Patient progressing toward long term goals..  Continue plan of care.  OT Short Term Goals Week 1:  OT Short Term Goal 1 (Week 1): Pt will locate 2 bathing/selfcare items left of midline with no more than mod instructional cueing.  OT Short Term Goal 1 - Progress (Week 1): Met OT Short Term Goal 2 (Week 1): Pt will complete UB bathing with supervision in unsupported sitting. OT Short Term Goal 2 - Progress (Week 1): Met OT Short Term Goal 3 (Week 1): Pt will complete LB bathing sit to stand with supervision.  OT Short Term Goal 3 - Progress (Week 1): Met OT Short Term Goal 4 (Week 1): Pt will perform LB dressing with min assist sit to stand.  OT Short Term Goal 4 - Progress (Week 1): Met OT Short Term Goal 5 (Week 1): Pt will complete toilet transfers to elevated toilet or 3:1 with supervision using RW for support.  OT Short Term Goal 5 - Progress (Week 1): Met Week 2:  OT Short Term Goal 1 (Week 2): STG=LTG due to LOS  Skilled Therapeutic  Interventions/Progress Updates:    Pt seen for OT session focusing on functional transfers, functional ambulation, and fine motor coordination in prep for dressing task. Pt in supine upon arrival, agreeable to tx session. He declined bathing/dressing this morning. He ambulated throughout unit with close supervision- guarding assist without AD. In ADL apartment, pt completed simulated tub/shower transfer as well as shower stall transfer as pt has access to both at d/c. He completed shower stall transfer with supervision/ VCs following demonstration. Initially min A, however, progressing to supervision on subsequent trials. In therapy gym, pt completed fine motor activity practicing fastening and unfastening large buttons progressing to small buttons on dress shirt. Required significantly increased time, and modified positioning of item due to visual deficits. He was able to mainpulate small and large buttons when placed in front of him, however, was unable to sequence and line-up small buttons on dress shirt while shirt was donned.  Pt returned to room at end of session, left seated in recliner with all needs in reach, QRB donned.   Therapy Documentation Precautions:  Precautions Precautions: Fall Precaution Comments: NPO  NG tube, left neglect/visual field deficit, global aphasia Restrictions Weight Bearing Restrictions: No Pain:   No/denies pain  See Function Navigator for Current Functional Status.   Therapy/Group: Individual Therapy  Amerigo Mcglory L 07/03/2017, 7:12 AM

## 2017-07-03 NOTE — Progress Notes (Signed)
Physical Therapy Session Note  Patient Details  Name: Ronald [name] MRN: [id number] Date of Birth: [birth year]  Today's Date: 07/03/2017 PT Individual Time: [phone] and 1500-1530 PT Individual Time Calculation (min): 40 min  And 30 min  Short Term Goals: Week 2:  PT Short Term Goal 1 (Week 2): STG =LTG due to ELOS  Skilled Therapeutic Interventions/Progress Updates: Tx1: Pt presepnted in bed with SO present agreeable to therapy. Pt performed bed mobility with supervision with cues for safety as pt attempting to get out of bed with 4th rails up. Pt ambulated to toilet with RW min guard with supervision for clothing management. Pt ambulated to rehab gym with RW with pt demonstrating poor awareness with use of RW in congested spaces. Once in rehab gym pt participated in gait and balance activities without AD. Pt able to pick up horseshoes off floor with min guard and no LOB. Pt ambulated weaving around and over cones with min guard and x 1 occurrence of knocking cone over. Pt ambulated back to room no AD with improved object negotiation. Pt returned to bed at end of session with bed alarm set and needs within reach.     Tx2: Pt presented in bed agreeable to therapy. Pt performed bed mobility with supervision and use of features and performed ambulatory transfer to toilet (+void), pt ambulated no AD to day room with verbal cues for clearing obstacles on L and transferred to NuStep. Pt participated in NuStep L2 x 8 min for reciprocal activity and endurance. Pt returned to room after NuStep in same manner as prior and returned to bed. Pt left in bed at end of session with call bell within reach and needs met.   Therapy Documentation Precautions:  Precautions Precautions: Fall Precaution Comments: NPO  NG tube, left neglect/visual field deficit, global aphasia Restrictions Weight Bearing Restrictions: No General:   Vital Signs: Therapy Vitals Temp: 98.1 F (36.7 C) Temp Source: Oral Pulse  Rate: 81 Resp: 18 BP: (!) 155/60 Patient Position (if appropriate): Sitting Oxygen Therapy SpO2: 97 % O2 Device: Room Air     See Function Navigator for Current Functional Status.   Therapy/Group: Individual Therapy  Terrie Grajales  Lyndol Vanderheiden, PTA  07/03/2017, 4:10 PM

## 2017-07-03 NOTE — Progress Notes (Signed)
Subjective/Complaints:  No oral pain, aggressive oral care continues No coughing  Review of systems: Difficult to obtain secondary to severe dysarthria, appears to suggest right shoulder pain  Objective: Vital Signs: Blood pressure (!) 141/74, pulse 84, temperature (!) 97.5 F (36.4 C), temperature source Oral, resp. rate 17, height 5\' 11"  (1.803 m), weight 72.2 kg (159 lb 2.8 oz), SpO2 98 %. No results found. Results for orders placed or performed during the hospital encounter of 06/23/17 (from the past 72 hour(s))  Glucose, capillary     Status: None   Collection Time: 06/30/17 11:48 AM  Result Value Ref Range   Glucose-Capillary 94 65 - 99 mg/dL  Glucose, capillary     Status: None   Collection Time: 06/30/17  4:37 PM  Result Value Ref Range   Glucose-Capillary 97 65 - 99 mg/dL  Glucose, capillary     Status: Abnormal   Collection Time: 06/30/17  9:11 PM  Result Value Ref Range   Glucose-Capillary 110 (H) 65 - 99 mg/dL   Comment 1 Notify RN   Glucose, capillary     Status: Abnormal   Collection Time: 07/01/17  6:44 AM  Result Value Ref Range   Glucose-Capillary 110 (H) 65 - 99 mg/dL   Comment 1 Notify RN   Glucose, capillary     Status: None   Collection Time: 07/01/17 11:30 AM  Result Value Ref Range   Glucose-Capillary 88 65 - 99 mg/dL  Glucose, capillary     Status: None   Collection Time: 07/01/17  5:32 PM  Result Value Ref Range   Glucose-Capillary 85 65 - 99 mg/dL  Glucose, capillary     Status: Abnormal   Collection Time: 07/01/17  9:19 PM  Result Value Ref Range   Glucose-Capillary 115 (H) 65 - 99 mg/dL   Comment 1 Notify RN   Glucose, capillary     Status: Abnormal   Collection Time: 07/02/17  6:37 AM  Result Value Ref Range   Glucose-Capillary 135 (H) 65 - 99 mg/dL   Comment 1 Notify RN   Glucose, capillary     Status: None   Collection Time: 07/02/17 11:41 AM  Result Value Ref Range   Glucose-Capillary 97 65 - 99 mg/dL  Glucose, capillary      Status: Abnormal   Collection Time: 07/02/17  4:41 PM  Result Value Ref Range   Glucose-Capillary 115 (H) 65 - 99 mg/dL  Glucose, capillary     Status: Abnormal   Collection Time: 07/03/17  6:52 AM  Result Value Ref Range   Glucose-Capillary 116 (H) 65 - 99 mg/dL   Comment 1 Notify RN      HEENT: Normocephalic, atraumatic.Small to moderate Whitish plaque on tongue, no plaques in the pharyngeal area, no plaques in buccal area Cardio: irregularly irregular and no JVD. Resp: CTA B/L and unlabored, occasional crackles left base GI: BS positive and ND Skin:   Scattered bruises Neuro: Limited due to severe dysarthria and aphasia Motor:  4/5 on Left sided 5/5 on right side Musc/Skel:  No edema. No tenderness.  No pain with PROM right shoulder Gen NAD. Vital signs reviewed  Assessment/Plan: 1. 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 Physiatrist is providing close team supervision and 24 hour management of active medical problems  listed below. Physiatrist and rehab team continue to assess barriers to discharge/monitor patient progress toward functional and medical goals. FIM: Function - Bathing Position: Shower Body parts bathed by patient: Left arm, Chest, Abdomen, Front perineal area, Buttocks, Right upper leg, Left upper leg, Right lower leg, Left lower leg, Right arm Body parts bathed by helper: Back Assist Level: Supervision or verbal cues  Function- Upper Body Dressing/Undressing What is the patient wearing?: Pull over shirt/dress Pull over shirt/dress - Perfomed by patient: Thread/unthread right sleeve, Put head through opening, Pull shirt over trunk, Thread/unthread left sleeve Pull over shirt/dress - Perfomed by  helper: Thread/unthread left sleeve Button up shirt - Perfomed by patient: Thread/unthread right sleeve Button up shirt - Perfomed by helper: Thread/unthread left sleeve, Pull shirt around back Assist Level: Supervision or verbal cues Function - Lower Body Dressing/Undressing What is the patient wearing?: Socks, Shoes, Pants, Underwear Position: Sitting EOB Underwear - Performed by patient: Thread/unthread right underwear leg, Thread/unthread left underwear leg, Pull underwear up/down Underwear - Performed by helper: Pull underwear up/down Pants- Performed by patient: Thread/unthread right pants leg, Thread/unthread left pants leg, Pull pants up/down Pants- Performed by helper: Pull pants up/down Socks - Performed by patient: Don/doff right sock, Don/doff left sock Socks - Performed by helper: Don/doff right sock, Don/doff left sock Shoes - Performed by patient: Don/doff right shoe, Don/doff left shoe Assist for footwear: Supervision/touching assist Assist for lower body dressing: Supervision or verbal cues  Function - Toileting Toileting steps completed by patient: Adjust clothing prior to toileting Toileting steps completed by helper: Adjust clothing prior to toileting, Performs perineal hygiene, Adjust clothing after toileting Toileting Assistive Devices: Grab bar or rail Assist level: Touching or steadying assistance (Pt.75%)  Function - ArchivistToilet Transfers Toilet transfer assistive device: Walker, Grab bar Assist level to toilet: Touching or steadying assistance (Pt > 75%) Assist level from toilet: Touching or steadying assistance (Pt > 75%)  Function - Chair/bed transfer Chair/bed transfer method: Ambulatory Chair/bed transfer assist level: Supervision or verbal cues Chair/bed transfer assistive device: Bedrails, Armrests Chair/bed transfer details: Verbal cues for precautions/safety, Verbal cues for technique, Verbal cues for sequencing  Function - Locomotion: Wheelchair Will  patient use wheelchair at discharge?: No Function - Locomotion: Ambulation Assistive device: Walker-rolling Max distance: [id number]  Assist level: Touching or steadying assistance (Pt > 75%) Assist level: Touching or steadying assistance (Pt > 75%) Assist level: Touching or steadying assistance (Pt > 75%) Assist level: Touching or steadying assistance (Pt > 75%) Walk 10 feet on uneven surfaces activity did not occur: Safety/medical concerns Assist level: Touching or steadying assistance (Pt > 75%)  Function - Comprehension Comprehension: Auditory Comprehension assist level: Understands basic 50 - 74% of the time/ requires cueing 25 - 49% of the time  Function - Expression Expression: Verbal Expression assist level: Expresses basis less than 25% of the time/requires cueing >75% of the time.  Function - Social Interaction Social Interaction assist level: Interacts appropriately less than 25% of the time. May be withdrawn or combative.  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 DVT Prophylaxis/Anticoagulation: SCD's only given hemorrhage.Monitor for signs of DVT -Dopplers negative 3. Pain Management:Tylenol as needed   Voltaren gel ordered for right shoulder 4. Mood:Provide emotional  support 5. Neuropsych: This patientiscapable of making decisions on hisown behalf. 6. Skin/Wound Care:Routine skin checks 7. Fluids/Electrolytes/Nutrition:Routine I&Os   BMP within acceptable range on 2/25 8. Dysphagia.NPO. Cortrakfor nutritional support.  Repeat modified barium swallow, no improvement with swallowing.  10.Hypothyroidism. Synthroid 11.Hyperlipidemia.Lipitor 12.Atrial fibrillation. Cardiac rate controlled. Chronic Coumadin on hold d/t hemorrhage.  Continue ASA 81mg , repeat CT scan , may resume warfarin if CT ok and PEG is placed 13.Tobacco abuse. Counseling 14.  Hypoalbuminemia : pro-stat 15. HTN    Vitals:   07/02/17 1537 07/03/17 0219  BP: (!) 170/71 (!) 141/74  Pulse: 91 84  Resp: 16 17  Temp: 97.9 F (36.6 C) (!) 97.5 F (36.4 C)  SpO2: 100% 98%     Lisinopril 5 started on 2/27, will increase to 10 mg on 06/30/2017, no further changes for now  16.  Poor oral hygiene secondary to n.p.o. status and patient's inability to perform oral hygiene.Mouth and pharynx looking much better   Do not think this is thrush as lesions would not be removable mechanically. Continue aggressive oral care. LOS (Days) 10 A FACE TO FACE EVALUATION WAS PERFORMED  [name] 07/03/2017, 7:34 AM

## 2017-07-03 NOTE — Progress Notes (Signed)
Speech Language Pathology Daily Session Note  Patient Details  Name: Ronald [name] MRN: [id number] Date of Birth: 1941-02-15  Today's Date: 07/03/2017 SLP Individual Time: 1330-1430 SLP Individual Time Calculation (min): 60 min  Short Term Goals: Week 2: SLP Short Term Goal 1 (Week 2): Pt will facilitate swallow initiation in 5 out of 10 opportunities with oral stimulation and Max A cues.  SLP Short Term Goal 2 (Week 2): Pt will communicate wants/needs via multimodal communication including gestures, communication board, and verbal attempts given Max A verbal cues.  SLP Short Term Goal 3 (Week 2): Pt will complete 3 reps of CTAR with Max A cues.  SLP Short Term Goal 4 (Week 2): Pt will follow functional 2-step commands within context given Max A verbal and visual cues.  SLP Short Term Goal 5 (Week 2): Pt will imitate single-syllable functional words with ~50% intelligibility given Max A model prompts.   Skilled Therapeutic Interventions: Skilled treatment session focused on cognition and dysphagia goals. SLP facilitated session by providing Max A for oral care. SLP attempted to train pt with oral care, however pt unable to put finger over suction. SLP further facilitated session by providing oral stimulation with lemon swab. Pt with no swallow initiation (visual or palpatable). Pt with audible pharyngeal residue of own salvia with no movement of hyoid. Pt with coughing and holding ribs. Pt unable to perform CTAR as he isn't able to produce swallow. Despite Total A multimodal cues, pt unable to imitate any syllables. Pt was left upright in bed with all needs within reach.      Function:  Eating Eating Eating activity did not occur: Safety/medical concerns               Cognition Comprehension Comprehension assist level: Understands basic 25 - 49% of the time/ requires cueing 50 - 75% of the time;Understands basic 50 - 74% of the time/ requires cueing 25 - 49% of the time  Expression    Expression assist level: Expresses basis less than 25% of the time/requires cueing >75% of the time.  Social Interaction Social Interaction assist level: Interacts appropriately less than 25% of the time. May be withdrawn or combative.  Problem Solving Problem solving assist level: Solves basic less than 25% of the time - needs direction nearly all the time or does not effectively solve problems and may need a restraint for safety  Memory Memory assist level: Recognizes or recalls less than 25% of the time/requires cueing greater than 75% of the time    Pain Pain Assessment Pain Assessment: 0-10 Pain Score: 0-No pain  Therapy/Group: Individual Therapy  Ryelynn Guedea 07/03/2017, 2:22 PM

## 2017-07-04 ENCOUNTER — Inpatient Hospital Stay (HOSPITAL_COMMUNITY): Payer: Medicare Other

## 2017-07-04 ENCOUNTER — Inpatient Hospital Stay (HOSPITAL_COMMUNITY): Payer: Self-pay

## 2017-07-04 ENCOUNTER — Encounter (HOSPITAL_COMMUNITY): Payer: Self-pay | Admitting: Physician Assistant

## 2017-07-04 ENCOUNTER — Inpatient Hospital Stay (HOSPITAL_COMMUNITY): Payer: Self-pay | Admitting: Occupational Therapy

## 2017-07-04 ENCOUNTER — Inpatient Hospital Stay (HOSPITAL_COMMUNITY): Payer: Self-pay | Admitting: Physical Therapy

## 2017-07-04 ENCOUNTER — Inpatient Hospital Stay (HOSPITAL_COMMUNITY): Payer: Medicare Other | Admitting: Occupational Therapy

## 2017-07-04 HISTORY — PX: IR GASTROSTOMY TUBE MOD SED: IMG625

## 2017-07-04 LAB — PROTIME-INR
INR: 1.21
Prothrombin Time: 15.2 seconds (ref 11.4–15.2)

## 2017-07-04 LAB — CBC
HEMATOCRIT: 40 % (ref 39.0–52.0)
HEMOGLOBIN: 13.9 g/dL (ref 13.0–17.0)
MCH: 34.3 pg — ABNORMAL HIGH (ref 26.0–34.0)
MCHC: 34.8 g/dL (ref 30.0–36.0)
MCV: 98.8 fL (ref 78.0–100.0)
Platelets: 448 10*3/uL — ABNORMAL HIGH (ref 150–400)
RBC: 4.05 MIL/uL — ABNORMAL LOW (ref 4.22–5.81)
RDW: 13.4 % (ref 11.5–15.5)
WBC: 6.3 10*3/uL (ref 4.0–10.5)

## 2017-07-04 LAB — BASIC METABOLIC PANEL
ANION GAP: 9 (ref 5–15)
BUN: 15 mg/dL (ref 6–20)
CO2: 24 mmol/L (ref 22–32)
Calcium: 9.4 mg/dL (ref 8.9–10.3)
Chloride: 103 mmol/L (ref 101–111)
Creatinine, Ser: 0.68 mg/dL (ref 0.61–1.24)
GFR calc Af Amer: >60 mL/min (ref >60)
Glucose, Bld: 102 mg/dL — ABNORMAL HIGH (ref 65–99)
POTASSIUM: 4.2 mmol/L (ref 3.5–5.1)
SODIUM: 136 mmol/L (ref 135–145)

## 2017-07-04 MED ORDER — SODIUM CHLORIDE 0.9 % IV SOLN
INTRAVENOUS | Status: DC
  Administered 2017-07-04: 50 mL/h via INTRAVENOUS

## 2017-07-04 MED ORDER — FENTANYL CITRATE (PF) 100 MCG/2ML IJ SOLN
INTRAMUSCULAR | Status: AC
  Filled 2017-07-04: qty 4

## 2017-07-04 MED ORDER — SODIUM CHLORIDE 0.9 % IV SOLN
INTRAVENOUS | Status: AC | PRN
  Administered 2017-07-04: 10 mL/h via INTRAVENOUS

## 2017-07-04 MED ORDER — GLUCAGON HCL RDNA (DIAGNOSTIC) 1 MG IJ SOLR
INTRAMUSCULAR | Status: AC | PRN
  Administered 2017-07-04: .5 mg via INTRAVENOUS

## 2017-07-04 MED ORDER — LIDOCAINE HCL 1 % IJ SOLN
INTRAMUSCULAR | Status: AC | PRN
  Administered 2017-07-04: 6 mL

## 2017-07-04 MED ORDER — MIDAZOLAM HCL 2 MG/2ML IJ SOLN
INTRAMUSCULAR | Status: AC | PRN
  Administered 2017-07-04: 1 mg via INTRAVENOUS
  Administered 2017-07-04: 0.5 mg via INTRAVENOUS

## 2017-07-04 MED ORDER — IOPAMIDOL (ISOVUE-300) INJECTION 61%
INTRAVENOUS | Status: AC
  Administered 2017-07-04: 15 mL
  Filled 2017-07-04: qty 50

## 2017-07-04 MED ORDER — GLUCAGON HCL RDNA (DIAGNOSTIC) 1 MG IJ SOLR
INTRAMUSCULAR | Status: AC
  Filled 2017-07-04: qty 1

## 2017-07-04 MED ORDER — FENTANYL CITRATE (PF) 100 MCG/2ML IJ SOLN
INTRAMUSCULAR | Status: AC | PRN
  Administered 2017-07-04: 50 ug via INTRAVENOUS

## 2017-07-04 MED ORDER — MIDAZOLAM HCL 2 MG/2ML IJ SOLN
INTRAMUSCULAR | Status: AC
  Filled 2017-07-04: qty 4

## 2017-07-04 MED ORDER — LIDOCAINE HCL 1 % IJ SOLN
INTRAMUSCULAR | Status: AC
  Filled 2017-07-04: qty 20

## 2017-07-04 MED ORDER — CEFAZOLIN SODIUM-DEXTROSE 2-4 GM/100ML-% IV SOLN
INTRAVENOUS | Status: AC
  Administered 2017-07-04: 16:00:00
  Filled 2017-07-04: qty 100

## 2017-07-04 NOTE — Sedation Documentation (Signed)
Patient is resting comfortably. 

## 2017-07-04 NOTE — Progress Notes (Signed)
Physical Therapy Session Note  Patient Details  Name: Ronald [name] MRN: [id number] Date of Birth: [birth year]  Today's Date: 07/04/2017 PT Individual Time: 1030-1125 PT Individual Time Calculation (min): 55 min   Short Term Goals: Week 2:  PT Short Term Goal 1 (Week 2): STG =LTG due to ELOS  Skilled Therapeutic Interventions/Progress Updates: Pt presented in bed agreeable to therapy. Pt donned clothing with supervision and increased time. Pt participated in gait without AD and verbal cues for increased scanning to L. Placed brightly colored objects in hallways with pt requiring min to mod verbal cues for scanning to L. Pt performed ambulation througout unit in moderately congested areas with no LOB but requiring min cues with x 1 occurrence of hitting object on L when verbal cue not provided. Pt returned to room and pt doffed UB clothing and donned gown in preparation for PEG tube placement. Pt indicating need for oral care, nurse notified and pt left sitting at EOB with bed alarm on and nurse standing outside pt's room.      Therapy Documentation Precautions:  Precautions Precautions: Fall Precaution Comments: NPO  NG tube, left neglect/visual field deficit, global aphasia Restrictions Weight Bearing Restrictions: No  See Function Navigator for Current Functional Status.   Therapy/Group: Individual Therapy  Ronald [name]  Ronald [name], PTA  07/04/2017, 4:14 PM

## 2017-07-04 NOTE — Progress Notes (Signed)
Speech Language Pathology Daily Session Note  Patient Details  Name: Ronald [name] MRN: [id number] Date of Birth: 15-Apr-1941  Today's Date: 07/04/2017 SLP Individual Time: 0900-1000 SLP Individual Time Calculation (min): 60 min  Short Term Goals: Week 2: SLP Short Term Goal 1 (Week 2): Pt will facilitate swallow initiation in 5 out of 10 opportunities with oral stimulation and Max A cues.  SLP Short Term Goal 2 (Week 2): Pt will communicate wants/needs via multimodal communication including gestures, communication board, and verbal attempts given Max A verbal cues.  SLP Short Term Goal 3 (Week 2): Pt will complete 3 reps of CTAR with Max A cues.  SLP Short Term Goal 4 (Week 2): Pt will follow functional 2-step commands within context given Max A verbal and visual cues.  SLP Short Term Goal 5 (Week 2): Pt will imitate single-syllable functional words with ~50% intelligibility given Max A model prompts.   Skilled Therapeutic Interventions:Skilled ST services focused on swallow, cognition and family education skills. SLP facilitated oral care via suction toothbrush, piror to cold lemon glycerin swabs to initiate swallow function,pt demonstrated swallow initiation x 2 with Max A verbal, tactile and visual cues. SLP facilitated CTAR to strength pharyngeal function pt demonstrated 1 repetition with max A verbal, visual and tactile cues. SLP provided educated to pt and daughter pertaining to next steps in therapy,  in the hospital/ following with continued SLP services and plan for ordering repeat MBS with increase swallow function. Daughter requested vital stimulation therapy, SLP communicated request to certified therapist. SLP facilitated response to yes/no question given action picture cards, pt demonstrated 75% accuracy with max A verbal cues. SLP facilitated following basic body movement  Two step directions requiring max A verbal and tactile cues to stop initiation at 1 step direction. Pt was left in  room with call bell within reach. Reccomend to continue skilled ST services.      Function:  Eating Eating Eating activity did not occur: Safety/medical concerns(NPO)               Cognition Comprehension Comprehension assist level: Understands basic 75 - 89% of the time/ requires cueing 10 - 24% of the time  Expression   Expression assist level: Expresses basis less than 25% of the time/requires cueing >75% of the time.  Social Interaction Social Interaction assist level: Interacts appropriately less than 25% of the time. May be withdrawn or combative.  Problem Solving Problem solving assist level: Solves basic 25 - 49% of the time - needs direction more than half the time to initiate, plan or complete simple activities  Memory Memory assist level: Recognizes or recalls less than 25% of the time/requires cueing greater than 75% of the time    Pain Pain Assessment Pain Assessment: No/denies pain  Therapy/Group: Individual Therapy  Rubina Basinski  [facility]CR[facility] 07/04/2017, 4:36 PM

## 2017-07-04 NOTE — Progress Notes (Signed)
Occupational Therapy Session Note  Patient Details  Name: Ronald [name] MRN: [id number] Date of Birth: December 10, 1940  Today's Date: 07/04/2017 OT Individual Time: 1131-1200 OT Individual Time Calculation (min): 29 min    Short Term Goals: Week 2:  OT Short Term Goal 1 (Week 2): STG=LTG due to LOS  Skilled Therapeutic Interventions/Progress Updates:    Upon entering the room, pt supine in bed with RN providing oral care. Pt performed supine >sit with supervision and ambulating with min HHA into bathroom for toileting. Pt required steady assistance for balance during clothing management and hygiene after pt having BM. Pt returning to standing at sink with min guard for balance for hand hygiene and to comb hair. Pt returning to bed at end of session with bed alarm activated and call bell within reach.   Therapy Documentation Precautions:  Precautions Precautions: Fall Precaution Comments: NPO  NG tube, left neglect/visual field deficit, global aphasia Restrictions Weight Bearing Restrictions: No General:   Vital Signs: Therapy Vitals Temp: 97.8 F (36.6 C) Temp Source: Oral Pulse Rate: (!) 108 Resp: 16 BP: (!) 157/74 Patient Position (if appropriate): Lying Oxygen Therapy SpO2: 97 % O2 Device: Room Air  See Function Navigator for Current Functional Status.   Therapy/Group: Individual Therapy  Alen BleacherBradsher, Ronald [name] 07/04/2017, 2:18 PM

## 2017-07-04 NOTE — Progress Notes (Signed)
Occupational Therapy Session Note  Patient Details  Name: Ronald [name] MRN: [id number] Date of Birth: [birth year]  Today's Date: 07/04/2017 OT Individual Time: 1400-1500 OT Individual Time Calculation (min): 60 min    Short Term Goals: Week 2:  OT Short Term Goal 1 (Week 2): STG=LTG due to LOS  Skilled Therapeutic Interventions/Progress Updates:    Pt seen for OT ADL bathing/dressing session and for therapeutic activity. Pt in supine upon arrival, agreeable to tx session. He ambulated throughout session with close supervision, occasional min A and VCs for awareness to environmental obstacles on L. Pt gathered clothing items from closet with supervision. He bathed seated on tub bench, able to locate self care items on L side of shower. He stood with use of grab bars and completed pericare/buttock hygiene with supervision and UE support.  Pt returned to EOB to dress. Required max multimodal cues for correct orientation of underwear due to pt's impulsivity and difficulty following VCs secondary to global aphasia.  Grooming tasks completed standing at sink with supervision. He completed oral care with use of suction toothbrush, therapist assist for thoroughness. Pt ambulated to therapy gym. Completed peg board activity, required to follow simple 2 step pattern, ultimately requiring total A VCs to follow pattern. Pt able to use L UE mod I to remove pegs. He returned to room at end of session, left in supine with RN present prepping for off unit procedure.   Therapy Documentation Precautions:  Precautions Precautions: Fall Precaution Comments: NPO  NG tube, left neglect/visual field deficit, global aphasia Restrictions Weight Bearing Restrictions: No Pain:   No/denies pain  See Function Navigator for Current Functional Status.   Therapy/Group: Individual Therapy  Yalonda Sample L 07/04/2017, 7:09 AM

## 2017-07-04 NOTE — Progress Notes (Signed)
Subjective/Complaints: Cont with severe dysarthria, difficulty coughing up secretions Scheduled for PEG this am  Review of systems: Difficult to obtain secondary to severe dysarthria, appears to suggest right shoulder pain  Objective: Vital Signs: Blood pressure (!) 155/65, pulse 79, temperature 98 F (36.7 C), temperature source Oral, resp. rate 18, height '5\' 11"'  (1.803 m), weight 72.2 kg (159 lb 2.8 oz), SpO2 98 %. Ct Head Wo Contrast  Result Date: 07/03/2017 CLINICAL DATA:  Intracranial hemorrhage, stroke EXAM: CT HEAD WITHOUT CONTRAST TECHNIQUE: Contiguous axial images were obtained from the base of the skull through the vertex without intravenous contrast. COMPARISON:  CT head 06/22/2017 FINDINGS: Brain: Hypodensity in the right temporoparietal lobe compatible with subacute infarct unchanged. Improvement in hemorrhage in the right posterior temporal lobe. No new area of hemorrhage. Hypodensity right occipital lobe compatible with subacute infarct unchanged. Chronic infarct left frontal lobe unchanged. Ventricle size normal. No new area of hemorrhage or mass lesion Vascular: Negative for hyperdense vessel Skull: Negative Sinuses/Orbits: Mucosal edema paranasal sinuses. Bilateral cataract removal. 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 Improvement and hemorrhage in the right temporal lobe infarct. Subacute infarct right occipital lobe unchanged No new area of hemorrhage since the prior study. Electronically Signed   By: Franchot Gallo M.D.   On: 07/03/2017 12:31   Results for orders placed or performed during the hospital encounter of 06/23/17 (from the past 72 hour(s))  Glucose, capillary     Status: None   Collection Time: 07/01/17 11:30 AM  Result Value Ref Range   Glucose-Capillary 88 65 - 99 mg/dL  Glucose, capillary     Status: None   Collection Time: 07/01/17  5:32 PM  Result Value Ref Range   Glucose-Capillary  85 65 - 99 mg/dL  Glucose, capillary     Status: Abnormal   Collection Time: 07/01/17  9:19 PM  Result Value Ref Range   Glucose-Capillary 115 (H) 65 - 99 mg/dL   Comment 1 Notify RN   Glucose, capillary     Status: Abnormal   Collection Time: 07/02/17  6:37 AM  Result Value Ref Range   Glucose-Capillary 135 (H) 65 - 99 mg/dL   Comment 1 Notify RN   Glucose, capillary     Status: None   Collection Time: 07/02/17 11:41 AM  Result Value Ref Range   Glucose-Capillary 97 65 - 99 mg/dL  Glucose, capillary     Status: Abnormal   Collection Time: 07/02/17  4:41 PM  Result Value Ref Range   Glucose-Capillary 115 (H) 65 - 99 mg/dL  Glucose, capillary     Status: Abnormal   Collection Time: 07/03/17  6:52 AM  Result Value Ref Range   Glucose-Capillary 116 (H) 65 - 99 mg/dL   Comment 1 Notify RN   CBC     Status: Abnormal   Collection Time: 07/04/17  5:53 AM  Result Value Ref Range   WBC 6.3 4.0 - 10.5 K/uL   RBC 4.05 (L) 4.22 - 5.81 MIL/uL   Hemoglobin 13.9 13.0 - 17.0 g/dL   HCT 40.0 39.0 - 52.0 %   MCV 98.8 78.0 - 100.0 fL   MCH 34.3 (H) 26.0 - 34.0 pg   MCHC 34.8 30.0 - 36.0 g/dL   RDW 13.4 11.5 - 15.5 %   Platelets 448 (H) 150 - 400 K/uL    Comment: Performed at [facility], [address] [place], [place] [address]  Protime-INR  Status: None   Collection Time: 07/04/17  5:53 AM  Result Value Ref Range   Prothrombin Time 15.2 11.4 - 15.2 seconds   INR 1.21     Comment: Performed at [facility] [address], [place], [place] [address]  Basic metabolic panel     Status: Abnormal   Collection Time: 07/04/17  5:53 AM  Result Value Ref Range   Sodium 136 135 - 145 mmol/L   Potassium 4.2 3.5 - 5.1 mmol/L   Chloride 103 101 - 111 mmol/L   CO2 24 22 - 32 mmol/L   Glucose, Bld 102 (H) 65 - 99 mg/dL   BUN 15 6 - 20 mg/dL   Creatinine, Ser 0.68 0.61 - 1.24 mg/dL   Calcium 9.4 8.9 - 10.3 mg/dL   GFR calc non Af Amer >60 >60 mL/min   GFR calc Af Amer >60 >60  mL/min    Comment: (NOTE) The eGFR has been calculated using the CKD EPI equation. This calculation has not been validated in all clinical situations. eGFR's persistently <60 mL/min signify possible Chronic Kidney Disease.    Anion gap 9 5 - 15    Comment: Performed at [facility] [address], [place], [place] [address]     HEENT: Normocephalic, atraumatic.Small to moderate Whitish plaque on tongue, no plaques in the pharyngeal area, no plaques in buccal area Cardio: irregularly irregular and no JVD. Resp: CTA B/L and unlabored, occasional crackles left base GI: BS positive and ND Skin:   Scattered bruises Neuro: Limited due to severe dysarthria and aphasia Motor:  4/5 on Left sided 5/5 on right side Musc/Skel:  No edema. No tenderness.  No pain with PROM right shoulder Gen NAD. Vital signs reviewed  Assessment/Plan: 1. 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 Physiatrist is providing close team supervision and 24 hour management of active medical problems listed below. Physiatrist and rehab team continue to assess barriers to discharge/monitor patient progress toward functional and medical goals. FIM: Function - Bathing Position: Shower Body parts bathed by patient: Left arm, Chest, Abdomen, Front perineal area, Buttocks, Right upper leg, Left upper leg, Right lower leg, Left lower leg, Right arm Body parts bathed by helper: Back Assist Level: Supervision or verbal cues  Function- Upper Body Dressing/Undressing What is the patient wearing?: Pull over shirt/dress Pull over shirt/dress - Perfomed by patient: Thread/unthread right sleeve, Put head through opening, Pull shirt over trunk, Thread/unthread left  sleeve Pull over shirt/dress - Perfomed by helper: Thread/unthread left sleeve Button up shirt - Perfomed by patient: Thread/unthread right sleeve Button up shirt - Perfomed by helper: Thread/unthread left sleeve, Pull shirt around back Assist Level: Supervision or verbal cues Function - Lower Body Dressing/Undressing What is the patient wearing?: Socks, Shoes, Pants, Underwear Position: Sitting EOB Underwear - Performed by patient: Thread/unthread right underwear leg, Thread/unthread left underwear leg, Pull underwear up/down Underwear - Performed by helper: Pull underwear up/down Pants- Performed by patient: Thread/unthread right pants leg, Thread/unthread left pants leg, Pull pants up/down Pants- Performed by helper: Pull pants up/down Socks - Performed by patient: Don/doff right sock, Don/doff left sock Socks - Performed by helper: Don/doff right sock, Don/doff left sock Shoes - Performed by patient: Don/doff right shoe, Don/doff left shoe Assist for footwear:  Supervision/touching assist Assist for lower body dressing: Supervision or verbal cues  Function - Toileting Toileting steps completed by patient: Adjust clothing prior to toileting, Performs perineal hygiene, Adjust clothing after toileting Toileting steps completed by helper: Performs perineal hygiene, Adjust clothing after toileting Toileting Assistive Devices: Grab bar or rail Assist level: Touching or steadying assistance (Pt.75%)  Function - Toilet Transfers Toilet transfer assistive device: Walker, Grab bar Assist level to toilet: Touching or steadying assistance (Pt > 75%) Assist level from toilet: Touching or steadying assistance (Pt > 75%)  Function - Chair/bed transfer Chair/bed transfer method: Ambulatory Chair/bed transfer assist level: Supervision or verbal cues Chair/bed transfer assistive device: Bedrails, Armrests Chair/bed transfer details: Verbal cues for precautions/safety, Verbal cues for technique,  Verbal cues for sequencing  Function - Locomotion: Wheelchair Will patient use wheelchair at discharge?: No Function - Locomotion: Ambulation Assistive device: Walker-rolling Max distance: 232f  Assist level: Touching or steadying assistance (Pt > 75%) Assist level: Touching or steadying assistance (Pt > 75%) Assist level: Touching or steadying assistance (Pt > 75%) Assist level: Touching or steadying assistance (Pt > 75%) Walk 10 feet on uneven surfaces activity did not occur: Safety/medical concerns Assist level: Touching or steadying assistance (Pt > 75%)  Function - Comprehension Comprehension: Auditory Comprehension assist level: Understands basic 50 - 74% of the time/ requires cueing 25 - 49% of the time  Function - Expression Expression: Verbal Expression assist level: Expresses basis less than 25% of the time/requires cueing >75% of the time.  Function - Social Interaction Social Interaction assist level: Interacts appropriately less than 25% of the time. May be withdrawn or combative.  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 DVT Prophylaxis/Anticoagulation: SCD's only given hemorrhage.Monitor for signs of DVT -Dopplers negative 3. Pain Management:Tylenol as needed    Voltaren gel ordered for right shoulder 4. Mood:Provide emotional support 5. Neuropsych: This patientiscapable of making decisions on hisown behalf. 6. Skin/Wound Care:Routine skin checks 7. Fluids/Electrolytes/Nutrition:Routine I&Os   BMP within acceptable range on 2/25 8. Dysphagia.NPO. Cortrakfor nutritional support.  Repeat modified barium swallow, no improvement with swallowing.Anticipate need for PEG at least 2 mo  10.Hypothyroidism. Synthroid 11.Hyperlipidemia.Lipitor 12.Atrial fibrillation. Cardiac rate controlled. Chronic Coumadin on hold d/t hemorrhage.  Continue ASA 813m repeat CT scan , may resume warfarin if CT ok and PEG is placed Await Neuro input 13.Tobacco abuse. Counseling 14.  Hypoalbuminemia : pro-stat 15. HTN    Vitals:   07/03/17 1300 07/04/17 0255  BP: (!) 155/60 (!) 155/65  Pulse: 81 79  Resp: 18 18  Temp: 98.1 F (36.7 C) 98 F (36.7 C)  SpO2: 97% 98%     Lisinopril 5 started on 2/27, will increase to 10 mg on 06/30/2017, increase to 2097m3/6 16.  Poor oral hygiene secondary to n.p.o. status and patient's inability to perform  oral hygiene.Mouth and pharynx looking much better   Do not think this is thrush as lesions would not be removable mechanically. Continue aggressive oral care. LOS (Days) 11 A FACE TO FACE EVALUATION WAS PERFORMED  Charlett Blake 07/04/2017, 7:38 AM

## 2017-07-04 NOTE — Progress Notes (Signed)
[facility]. G tube placement completed, placed abdominally, right of midline, vital signs stable.

## 2017-07-05 ENCOUNTER — Inpatient Hospital Stay (HOSPITAL_COMMUNITY): Payer: Self-pay | Admitting: Physical Therapy

## 2017-07-05 ENCOUNTER — Inpatient Hospital Stay (HOSPITAL_COMMUNITY): Payer: Self-pay | Admitting: Occupational Therapy

## 2017-07-05 ENCOUNTER — Inpatient Hospital Stay (HOSPITAL_COMMUNITY): Payer: Self-pay | Admitting: Speech Pathology

## 2017-07-05 MED ORDER — JEVITY 1.5 CAL/FIBER PO LIQD
300.0000 mL | Freq: Four times a day (QID) | ORAL | Status: DC
  Administered 2017-07-05: 100 mL
  Administered 2017-07-06 – 2017-07-08 (×10): 300 mL
  Filled 2017-07-05 (×17): qty 1000

## 2017-07-05 MED ORDER — WARFARIN SODIUM 7.5 MG PO TABS
7.5000 mg | ORAL_TABLET | Freq: Once | ORAL | Status: AC
  Administered 2017-07-05: 7.5 mg via ORAL
  Filled 2017-07-05: qty 1

## 2017-07-05 MED ORDER — LISINOPRIL 20 MG PO TABS
20.0000 mg | ORAL_TABLET | Freq: Every day | ORAL | Status: DC
  Administered 2017-07-06 – 2017-07-08 (×3): 20 mg via ORAL
  Filled 2017-07-05 (×3): qty 1

## 2017-07-05 MED ORDER — FREE WATER
200.0000 mL | Freq: Four times a day (QID) | Status: DC
  Administered 2017-07-05 – 2017-07-08 (×10): 200 mL

## 2017-07-05 MED ORDER — PRO-STAT SUGAR FREE PO LIQD
30.0000 mL | Freq: Two times a day (BID) | ORAL | Status: DC
  Administered 2017-07-05 – 2017-07-08 (×6): 30 mL
  Filled 2017-07-05 (×6): qty 30

## 2017-07-05 MED ORDER — WARFARIN - PHARMACIST DOSING INPATIENT: Freq: Every day | Status: DC

## 2017-07-05 NOTE — Progress Notes (Signed)
Occupational Therapy Session Note  Patient Details  Name: Marinus MawOtis L Sine MRN: [id number] Date of Birth: 1940-07-29  Today's Date: 07/05/2017 OT Individual Time: 1400-1430 OT Individual Time Calculation (min): 30 min    Short Term Goals: Week 2:  OT Short Term Goal 1 (Week 2): STG=LTG due to LOS  Skilled Therapeutic Interventions/Progress Updates:    Pt seen for OT session focusing on ADL re-training, cognitive task, and fine motor coordination. Pt in supine upon arrival, gesturing for need for toileting task. He ambulated throughout room with close supervision, therapist managing IV pole. Toileting task completed with close supervision. Instance of IV line pulling from pt's hand, RN assessed and disconnected pt from IV pole for remainder of session.  He ambulated to therapy day room, requiring multimodal cuing for path finding and cuing for awareness of environmental obstacles on L.  He completed sorting task of 2 colored tokens, initially max A for following directions, however, once pt understood task, he completed at supervision level, able to recognize mistake with mod cuing and correct. Pt able to follow basic alternating pattern of tokens with mod cuing. Pt returned to room at end of session, left in supine with all needs in reach and bed alarm on.   Therapy Documentation Precautions:  Precautions Precautions: Fall Precaution Comments: NPO  NG tube, left neglect/visual field deficit, global aphasia Restrictions Weight Bearing Restrictions: No Pain:   No/denies pain   See Function Navigator for Current Functional Status.   Therapy/Group: Individual Therapy  Rino Hosea L 07/05/2017, 7:02 AM

## 2017-07-05 NOTE — Progress Notes (Signed)
Nutrition Follow-up  DOCUMENTATION CODES:   Non-severe (moderate) malnutrition in context of chronic illness  INTERVENTION:  Once PEG is safe and ready for use, Recommend bolus feeds using Jevity 1.5 formula at starting volume of 100 ml and increase by 100 ml every 6 hours to goal volume of 300 ml.  Provide 30 ml Prostat BID per tube.   Provide free water flushes of 200 ml QID per tube between boluses.   Tube feeding regimen to provide 2000 kcal, 107 grams of protein, 1712 ml water/day.  NUTRITION DIAGNOSIS:   Moderate Malnutrition related to chronic illness as evidenced by mild muscle depletion, moderate muscle depletion, mild fat depletion, moderate fat depletion; ongoing  GOAL:   Patient will meet greater than or equal to 90% of their needs; progressing  MONITOR:   TF tolerance, Weight trends, Labs  REASON FOR ASSESSMENT:   Consult Enteral/tube feeding initiation and management  ASSESSMENT:   77 y.o. 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   Weight has been stable. PEG has been placed yesterday. RD consulted for bolus tube feed transitioning. RD to put in bolus tube feeding orders. Feedings to restart once PEG is cleared and ready for use. RD to continue to monitor for tolerance.   Labs and medications reviewed.   Diet Order:  Fall precautions Diet NPO time specified  EDUCATION NEEDS:   No education needs have been identified at this time  Skin:  Skin Assessment: Reviewed RN Assessment  Last BM:  3/3  Height:   Ht Readings from Last 1 Encounters:  06/23/17 5\' 11"  (1.803 m)    Weight:   Wt Readings from Last 1 Encounters:  07/05/17 158 lb 4.6 oz (71.8 kg)    Ideal Body Weight:  78.18 kg  BMI:  Body mass index is 22.08 kg/m.  Estimated Nutritional  Needs:   Kcal:  2000-2200  Protein:  100-115 grams  Fluid:  >/= 2 L/day    Roslyn SmilingStephanie Adri Schloss, MS, RD, LDN Pager # [phone] After hours/ weekend pager # [phone]

## 2017-07-05 NOTE — Progress Notes (Signed)
Social Work Patient ID: Ronald [name], male   DOB: 05/15/1940, 77 y.o.   MRN: [id number] Met with pt and spoke with daughter-Debbie regarding team conference progressing toward his goals and the discharge still being 3/9. Discussed her coming in for family training and she will come tomorrow 9-12:00. Prefers [facility] home [facility] will contact to see if can see upon discharge. Work on discharge needs for Sat.

## 2017-07-05 NOTE — Progress Notes (Signed)
Occupational Therapy Session Note  Patient Details  Name: Ronald [name] MRN: [id number] Date of Birth: [birth year]  Today's Date: 07/05/2017 OT Individual Time: 1000-1045 OT Individual Time Calculation (min): 45 min    Short Term Goals: Week 1:  OT Short Term Goal 1 (Week 1): Pt will locate 2 bathing/selfcare items left of midline with no more than mod instructional cueing.  OT Short Term Goal 1 - Progress (Week 1): Met OT Short Term Goal 2 (Week 1): Pt will complete UB bathing with supervision in unsupported sitting. OT Short Term Goal 2 - Progress (Week 1): Met OT Short Term Goal 3 (Week 1): Pt will complete LB bathing sit to stand with supervision.  OT Short Term Goal 3 - Progress (Week 1): Met OT Short Term Goal 4 (Week 1): Pt will perform LB dressing with min assist sit to stand.  OT Short Term Goal 4 - Progress (Week 1): Met OT Short Term Goal 5 (Week 1): Pt will complete toilet transfers to elevated toilet or 3:1 with supervision using RW for support.  OT Short Term Goal 5 - Progress (Week 1): Met Week 2:  OT Short Term Goal 1 (Week 2): STG=LTG due to LOS  Skilled Therapeutic Interventions/Progress Updates:    1:1 Received pt in bed. Engaged pt in leaving the room for structure cognitive task. Pt got up and ambulated to bathroom and demonstrated toileting with supervision without prompting; as well as ambulated back to the sink to wash hands with 2 cues for things on the left.   Ambulated to the dayroom with HHA to guide pt visually through busy hallway.   Pt able to convey challenge with vision but was able to demonstrate some compensatory methods to locate objects on a table top.  Engaged in sorting task of coins; at first from a field of 2; progressing to sorting 4 with extra time and minimal cues for difference between nickel and quarter. Pt able a specific amt of money with extra time. Pt able to follow all one step directions with extra time. Pt challenged by field cut but able  to continue to maintain attention to task in a min to moderate distracting environment.  The busier visually the task the more challenging. PT able to complete 9 block match task with min cues (blue and purple were similar in color to him).  Returned to room and left in bed to rest with call bell.   Therapy Documentation Precautions:  Precautions Precautions: Fall Precaution Comments: NPO  NG tube, left neglect/visual field deficit, global aphasia Restrictions Weight Bearing Restrictions: No General:   Vital Signs: Therapy Vitals Temp: 98.3 F (36.8 C) Temp Source: Oral Pulse Rate: 98 Resp: 18 BP: (!) 162/81 Patient Position (if appropriate): Lying Oxygen Therapy SpO2: 94 % O2 Device: Room Air Pain: No c/o pain in session   See Function Navigator for Current Functional Status.   Therapy/Group: Individual Therapy  Willeen Cass [facility] 07/05/2017, 2:26 PM

## 2017-07-05 NOTE — Progress Notes (Signed)
Referring Physician(s): Claudette Laws  Supervising Physician: Gilmer Mor  Patient Status:  [facility] - In-pt  Chief Complaint:  Dysphagia S/P Gastrostomy by Dr. Deanne Coffer yesterday  Subjective:  Ronald [name] is doing ok. He has no complaints. Really no soreness at all at [place] site.  Allergies: Patient has no known allergies.  Medications: Prior to Admission medications   Medication Sig Start Date End Date Taking? Authorizing Provider  Aspirin-Salicylamide-Caffeine (ARTHRITIS STRENGTH BC POWDER PO) Take 1 packet by mouth every hour.   Yes [provider]  gabapentin (NEURONTIN) 100 MG capsule Take 100 mg by mouth 3 (three) times daily as needed (back pain/spasms).  05/19/17  Yes [provider]  levothyroxine (SYNTHROID, LEVOTHROID) 125 MCG tablet Take 125 mcg by mouth daily after supper.  05/19/17  Yes [provider]  metoprolol tartrate (LOPRESSOR) 50 MG tablet Take 50 mg by mouth daily after supper.  05/19/17  Yes [provider]  warfarin (COUMADIN) 5 MG tablet Take 5 mg by mouth daily after supper.  05/19/17  Yes [provider]     Vital Signs: BP (!) 162/81 (BP Location: Right Arm)   Pulse 98   Temp 98.3 F (36.8 C) (Oral)   Resp 18   Ht 5\' 11"  (1.803 m)   Wt 158 lb 4.6 oz (71.8 kg)   SpO2 94%   BMI 22.08 kg/m   Physical Exam Awake and alert NAD Abdomen soft, Gtube in place, site looks good. No bleeding.  Imaging: Ct Head Wo Contrast  Result Date: 07/03/2017 CLINICAL DATA:  Intracranial hemorrhage, stroke EXAM: CT HEAD WITHOUT CONTRAST TECHNIQUE: Contiguous axial images were obtained from the base of the skull through the vertex without intravenous contrast. COMPARISON:  CT head 06/22/2017 FINDINGS: Brain: Hypodensity in the right temporoparietal lobe compatible with subacute infarct unchanged. Improvement in hemorrhage in the right posterior temporal lobe. No new area of hemorrhage. Hypodensity right occipital lobe  compatible with subacute infarct unchanged. Chronic infarct left frontal lobe unchanged. Ventricle size normal. No new area of hemorrhage or mass lesion Vascular: Negative for hyperdense vessel Skull: Negative Sinuses/Orbits: Mucosal edema paranasal sinuses. Bilateral cataract removal. 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 Improvement and hemorrhage in the right temporal lobe infarct. Subacute infarct right occipital lobe unchanged No new area of hemorrhage since the prior study. Electronically Signed   By: Marlan Palau M.D.   On: 07/03/2017 12:31   Ir Gastrostomy Tube Mod Sed  Result Date: 07/04/2017 CLINICAL DATA:  Stroke, inadequate PO intake, needs enteral feeding support EXAM: PERC PLACEMENT GASTROSTOMY FLUOROSCOPY TIME:  1.5 minute; 604 uGym2 DAP TECHNIQUE: The procedure, risks, benefits, and alternatives were explained to the patient. Questions regarding the procedure were encouraged and answered. The patient understands and consents to the procedure. As antibiotic prophylaxis, cefazolin 2 g was ordered pre-procedure and administered intravenously within one hour of incision. Review of recent CT demonstrated safe percutaneous approach for gastrostomy. A 5 French angiographic catheter was placed as orogastric tube. The upper abdomen was prepped with Betadine, draped in usual sterile fashion, and infiltrated locally with 1% lidocaine. Intravenous Fentanyl and Versed were administered as conscious sedation during continuous monitoring of the patient's level of consciousness and physiological / cardiorespiratory status by the radiology RN, with a total moderate sedation time of 8 minutes. 0.5 mg glucagon given IV to facilitate gastric distention.Stomach was insufflated using air through the orogastric tube. An 53 French sheath needle was advanced percutaneously into  the gastric lumen under fluoroscopy. Gas could be aspirated and a  small contrast injection confirmed intraluminal spread. The sheath was exchanged over a guidewire for a 9 [place] vascular sheath, through which the snare device was advanced and used to snare a guidewire passed through the orogastric tube. This was withdrawn, and the snare attached to the 20 French pull-through gastrostomy tube, which was advanced antegrade, positioned with the internal bumper securing the anterior gastric wall to the anterior abdominal wall. Small contrast injection confirms appropriate positioning. The external bumper was applied and the catheter was flushed. COMPLICATIONS: COMPLICATIONS none IMPRESSION: 1. Technically successful 20 French pull-through gastrostomy placement under fluoroscopy. Electronically Signed   By: Corlis Leak  Hassell M.D.   On: 07/04/2017 17:14    Labs:  CBC: Recent Labs    06/21/17 0346 06/23/17 0514 06/26/17 0601 07/04/17 0553  WBC 8.9 7.9 6.4 6.3  HGB 13.5 12.3* 13.5 13.9  HCT 39.1 34.3* 38.3* 40.0  PLT 135* 140* 211 448*    COAGS: Recent Labs    06/19/17 1917 07/04/17 0553  INR 1.23 1.21  APTT 32  --     BMP: Recent Labs    06/22/17 0355 06/23/17 0514 06/26/17 0601 07/04/17 0553  NA 137 140 137 136  K 3.8 3.5 3.6 4.2  CL 104 108 103 103  CO2 23 19* 24 24  GLUCOSE 113* 93 112* 102*  BUN 7 8 16 15   CALCIUM 8.7* 8.8* 8.7* 9.4  CREATININE 0.67 0.60* 0.60* 0.68  GFRNONAA >60 >60 >60 >60  GFRAA >60 >60 >60 >60    LIVER FUNCTION TESTS: Recent Labs    06/19/17 1917 06/26/17 0601  BILITOT 0.6 1.4*  AST 30 48*  ALT 19 23  ALKPHOS 43 47  PROT 6.3* 6.0*  ALBUMIN 3.8 3.0*    Assessment and Plan:  Dysphagia  S/P Gastrostomy tube yesterday by Dr. Deanne CofferHassell.  OK to start tube feeds. Routine Gtube care.  Electronically Signed: Gwynneth MacleodWENDY S Renessa Wellnitz, PA-C 07/05/2017, 2:59 PM   I spent a total of 15 Minutes at the the patient's bedside AND on the patient's hospital floor or unit, greater than 50% of which was counseling/coordinating care  for f/u after gtube.

## 2017-07-05 NOTE — Progress Notes (Signed)
Physical Therapy Session Note  Patient Details  Name: Ronald [name] MRN: [id number] Date of Birth: 03-19-1941  Today's Date: 07/05/2017 PT Individual Time: 0802-0900 PT Individual Time Calculation (min): 58 min   Short Term Goals: Week 1:  PT Short Term Goal 1 (Week 1): Pt will ambulate 150' with LRAD and min assist PT Short Term Goal 1 - Progress (Week 1): Met PT Short Term Goal 2 (Week 1): Pt will negotiate 4 steps with 1 rail and min assist PT Short Term Goal 2 - Progress (Week 1): Met PT Short Term Goal 3 (Week 1): Pt will complete standardized balance assessment.  PT Short Term Goal 3 - Progress (Week 1): Met Week 2:  PT Short Term Goal 1 (Week 2): STG =LTG due to ELOS  Skilled Therapeutic Interventions/Progress Updates:   Pt received supine in bed and agreeable to PT. Supine>sit transfer with supervision assist. Pt noted to be incontinent of bladder.  Ambulatory Bathroom transfer with supervision assist. Pt instructed in per care with supervision/set up from PT. Pt assisted with dressing task and set up from PT. Sit<> stand following toiletiing and clothing management with supervision assist.   Gait training in hall with supervision assist from PT x 136f, 2020f [id number] also instructed pt in dynamic gait training through suimulated home environment to avoid furniture in day room. Moderate cues for visual scanning to the L.   Dynamic standing balance on airex pad to throw horse shoe 2 x 8 with lateral reach to the L prior to each throw. Pt instructed in functional balance tasks to pick up horse shoes from ground with supervision assist.   Nustep endurance training x 8 minutes . Min cues for proper ROM and speed.   Pt left in day room for SLP treatment.         Therapy Documentation Precautions:  Precautions Precautions: Fall Precaution Comments: NPO  NG tube, left neglect/visual field deficit, global aphasia Restrictions Weight Bearing Restrictions: No Pain:  denies.   See Function Navigator for Current Functional Status.   Therapy/Group: Individual Therapy  AustLorie Phenix/2019, 9:02 AM

## 2017-07-05 NOTE — Progress Notes (Signed)
Loss of IV site for 2nd time today.  Called and discussed with PA. IV not needed at this time, new orders received and updated.

## 2017-07-05 NOTE — Progress Notes (Signed)
Subjective/Complaints: PEG placed, has some soreness in that area. Neuro has reviewed CT scan, hemorrhage improving, okay for warfarin  Review of systems: Difficult to obtain secondary to severe dysarthria, appears to suggest right shoulder pain  Objective: Vital Signs: Blood pressure (!) 138/98, pulse (!) 130, temperature 98 F (36.7 C), temperature source Oral, resp. rate 19, height '5\' 11"'  (1.803 m), weight 71.8 kg (158 lb 4.6 oz), SpO2 96 %. Ct Head Wo Contrast  Result Date: 07/03/2017 CLINICAL DATA:  Intracranial hemorrhage, stroke EXAM: CT HEAD WITHOUT CONTRAST TECHNIQUE: Contiguous axial images were obtained from the base of the skull through the vertex without intravenous contrast. COMPARISON:  CT head 06/22/2017 FINDINGS: Brain: Hypodensity in the right temporoparietal lobe compatible with subacute infarct unchanged. Improvement in hemorrhage in the right posterior temporal lobe. No new area of hemorrhage. Hypodensity right occipital lobe compatible with subacute infarct unchanged. Chronic infarct left frontal lobe unchanged. Ventricle size normal. No new area of hemorrhage or mass lesion Vascular: Negative for hyperdense vessel Skull: Negative Sinuses/Orbits: Mucosal edema paranasal sinuses. Bilateral cataract removal. 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 Improvement and hemorrhage in the right temporal lobe infarct. Subacute infarct right occipital lobe unchanged No new area of hemorrhage since the prior study. Electronically Signed   By: Franchot Gallo M.D.   On: 07/03/2017 12:31   Ir Gastrostomy Tube Mod Sed  Result Date: 07/04/2017 CLINICAL DATA:  Stroke, inadequate PO intake, needs enteral feeding support EXAM: PERC PLACEMENT GASTROSTOMY FLUOROSCOPY TIME:  1.5 minute; 604 uGym2 DAP TECHNIQUE: The procedure, risks, benefits, and alternatives were explained to the patient. Questions regarding the procedure were  encouraged and answered. The patient understands and consents to the procedure. As antibiotic prophylaxis, cefazolin 2 g was ordered pre-procedure and administered intravenously within one hour of incision. Review of recent CT demonstrated safe percutaneous approach for gastrostomy. A 5 French angiographic catheter was placed as orogastric tube. The upper abdomen was prepped with Betadine, draped in usual sterile fashion, and infiltrated locally with 1% lidocaine. Intravenous Fentanyl and Versed were administered as conscious sedation during continuous monitoring of the patient's level of consciousness and physiological / cardiorespiratory status by the radiology RN, with a total moderate sedation time of 8 minutes. 0.5 mg glucagon given IV to facilitate gastric distention.Stomach was insufflated using air through the orogastric tube. An 93 French sheath needle was advanced percutaneously into the gastric lumen under fluoroscopy. Gas could be aspirated and a small contrast injection confirmed intraluminal spread. The sheath was exchanged over a guidewire for a 9 [place] vascular sheath, through which the snare device was advanced and used to snare a guidewire passed through the orogastric tube. This was withdrawn, and the snare attached to the 20 French pull-through gastrostomy tube, which was advanced antegrade, positioned with the internal bumper securing the anterior gastric wall to the anterior abdominal wall. Small contrast injection confirms appropriate positioning. The external bumper was applied and the catheter was flushed. COMPLICATIONS: COMPLICATIONS none IMPRESSION: 1. Technically successful 20 French pull-through gastrostomy placement under fluoroscopy. Electronically Signed   By: Lucrezia Europe M.D.   On: 07/04/2017 17:14   Results for orders placed or performed during the hospital encounter of 06/23/17 (from the past 72 hour(s))  Glucose, capillary     Status: None   Collection Time: 07/02/17 11:41 AM   Result Value Ref Range   Glucose-Capillary 97 65 - 99 mg/dL  Glucose, capillary     Status: Abnormal  Collection Time: 07/02/17  4:41 PM  Result Value Ref Range   Glucose-Capillary 115 (H) 65 - 99 mg/dL  Glucose, capillary     Status: Abnormal   Collection Time: 07/03/17  6:52 AM  Result Value Ref Range   Glucose-Capillary 116 (H) 65 - 99 mg/dL   Comment 1 Notify RN   CBC     Status: Abnormal   Collection Time: 07/04/17  5:53 AM  Result Value Ref Range   WBC 6.3 4.0 - 10.5 K/uL   RBC 4.05 (L) 4.22 - 5.81 MIL/uL   Hemoglobin 13.9 13.0 - 17.0 g/dL   HCT 40.0 39.0 - 52.0 %   MCV 98.8 78.0 - 100.0 fL   MCH 34.3 (H) 26.0 - 34.0 pg   MCHC 34.8 30.0 - 36.0 g/dL   RDW 13.4 11.5 - 15.5 %   Platelets 448 (H) 150 - 400 K/uL    Comment: Performed at [facility], [place]. [address] [place], [place] [address]  Protime-INR     Status: None   Collection Time: 07/04/17  5:53 AM  Result Value Ref Range   Prothrombin Time 15.2 11.4 - 15.2 seconds   INR 1.21     Comment: Performed at [facility] [address], [place], [place] [address]  Basic metabolic panel     Status: Abnormal   Collection Time: 07/04/17  5:53 AM  Result Value Ref Range   Sodium 136 135 - 145 mmol/L   Potassium 4.2 3.5 - 5.1 mmol/L   Chloride 103 101 - 111 mmol/L   CO2 24 22 - 32 mmol/L   Glucose, Bld 102 (H) 65 - 99 mg/dL   BUN 15 6 - 20 mg/dL   Creatinine, Ser 0.68 0.61 - 1.24 mg/dL   Calcium 9.4 8.9 - 10.3 mg/dL   GFR calc non Af Amer >60 >60 mL/min   GFR calc Af Amer >60 >60 mL/min    Comment: (NOTE) The eGFR has been calculated using the CKD EPI equation. This calculation has not been validated in all clinical situations. eGFR's persistently <60 mL/min signify possible Chronic Kidney Disease.    Anion gap 9 5 - 15    Comment: Performed at [facility] [address], [place], [place] [address]     HEENT: Normocephalic, atraumatic.Small to moderate Whitish plaque on tongue, no plaques in  the pharyngeal area, no plaques in buccal area Cardio: irregularly irregular and no JVD. Resp: CTA B/L and unlabored, occasional crackles left base GI: BS positive and ND Skin:   Scattered bruises Neuro: Limited due to severe dysarthria and aphasia Motor:  4/5 on Left sided 5/5 on right side Musc/Skel:  No edema. No tenderness.  No pain with PROM right shoulder Gen NAD. Vital signs reviewed  Assessment/Plan: 1. 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 Physiatrist is providing close team supervision and 24 hour management of active medical problems listed below. Physiatrist and rehab team continue to assess barriers to discharge/monitor patient progress toward functional and medical goals. FIM: Function - Bathing Position: Shower Body parts bathed by patient: Left arm, Chest, Abdomen, Front perineal area, Buttocks, Right upper leg, Left upper leg, Right lower leg, Left lower leg, Right arm Body parts bathed by helper: Back Assist Level: Supervision  or verbal cues  Function- Upper Body Dressing/Undressing What is the patient wearing?: [place] over shirt/dress - Perfomed by patient: Thread/unthread right sleeve, Put head through opening, Pull shirt over trunk, Thread/unthread left sleeve Pull over shirt/dress - Perfomed by helper: Thread/unthread left sleeve Button up shirt - Perfomed by patient: Thread/unthread right sleeve Button up shirt - Perfomed by helper: Thread/unthread left sleeve, Pull shirt around back Assist Level: Supervision or verbal cues Function - Lower Body Dressing/Undressing What is the patient wearing?: Hospital Gown, Non-skid slipper socks, Shoes, Underwear Position: Sitting EOB Underwear -  Performed by patient: Thread/unthread right underwear leg, Thread/unthread left underwear leg, Pull underwear up/down Underwear - Performed by helper: Pull underwear up/down Pants- Performed by patient: Thread/unthread right pants leg, Thread/unthread left pants leg, Pull pants up/down Pants- Performed by helper: Pull pants up/down Non-skid slipper socks- Performed by patient: Don/doff right sock, Don/doff left sock Socks - Performed by patient: Don/doff right sock, Don/doff left sock Socks - Performed by helper: Don/doff right sock, Don/doff left sock Shoes - Performed by patient: Don/doff right shoe, Don/doff left shoe(Slip ons) Assist for footwear: Supervision/touching assist Assist for lower body dressing: Touching or steadying assistance (Pt > 75%)  Function - Toileting Toileting steps completed by patient: Adjust clothing prior to toileting, Performs perineal hygiene, Adjust clothing after toileting Toileting steps completed by helper: Performs perineal hygiene, Adjust clothing after toileting Toileting Assistive Devices: Grab bar or rail Assist level: Touching or steadying assistance (Pt.75%)  Function - Air cabin crew transfer assistive device: Walker, Grab bar Assist level to toilet: Touching or steadying assistance (Pt > 75%) Assist level from toilet: Touching or steadying assistance (Pt > 75%)  Function - Chair/bed transfer Chair/bed transfer method: Ambulatory Chair/bed transfer assist level: Supervision or verbal cues Chair/bed transfer assistive device: Bedrails, Armrests Chair/bed transfer details: Verbal cues for precautions/safety, Verbal cues for technique, Verbal cues for sequencing  Function - Locomotion: Wheelchair Will patient use wheelchair at discharge?: No Function - Locomotion: Ambulation Assistive device: Hand held assist Max distance: 10' Assist level: Touching or steadying assistance (Pt > 75%) Assist level: Touching or steadying assistance (Pt  > 75%) Assist level: Touching or steadying assistance (Pt > 75%) Assist level: Touching or steadying assistance (Pt > 75%) Walk 10 feet on uneven surfaces activity did not occur: Safety/medical concerns Assist level: Touching or steadying assistance (Pt > 75%)  Function - Comprehension Comprehension: Auditory Comprehension assist level: Understands basic 75 - 89% of the time/ requires cueing 10 - 24% of the time  Function - Expression Expression: Verbal Expression assist level: Expresses basis less than 25% of the time/requires cueing >75% of the time.  Function - Social Interaction Social Interaction assist level: Interacts appropriately less than 25% of the time. May be withdrawn or combative.  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 Formulized individual treatment plan based on medical history, underlying problem and comorbidities. discharge date 07/08/2017 2. DVT Prophylaxis/Anticoagulation: SCD's only given hemorrhage.Monitor for signs of DVT -Dopplers negative 3. Pain Management:Tylenol as needed   Voltaren gel ordered for right shoulder 4. Mood:Provide emotional support 5. Neuropsych:  This patientiscapable of making decisions on hisown behalf. 6. Skin/Wound Care:Routine skin checks 7. Fluids/Electrolytes/Nutrition:Routine I&Os   BMP within acceptable range on 2/25 8. Dysphagia.NPO. Cortrakfor nutritional support.  Repeat modified barium swallow, no improvement with swallowing.Anticipate need for PEG at least 2 mo  10.Hypothyroidism. Synthroid 11.Hyperlipidemia.Lipitor 12.Atrial fibrillation. Cardiac rate controlled. Chronic Coumadin on hold d/t hemorrhage.  Discontinue ASA 91m,, may resume warfarin13.Tobacco abuse. Counseling 14.  Hypoalbuminemia : pro-stat 15. HTN    Vitals:   07/04/17 1610 07/05/17 0351  BP: (!) 159/60 (!) 138/98  Pulse: 88 (!) 130  Resp: 19 19  Temp:  98 F (36.7 C)  SpO2: 97% 96%     Lisinopril 5 started on 2/27, will increase to 10 mg on 06/30/2017, increase to 273m 3/6 16.  Poor oral hygiene secondary to n.p.o. status and patient's inability to perform oral hygiene.Mouth and pharynx looking much better   Do not think this is thrush as lesions would not be removable mechanically. Continue aggressive oral care. LOS (Days) 12 A FACE TO FACE EVALUATION WAS PERFORMED  AnCharlett Blake/09/2017, 9:14 AM

## 2017-07-05 NOTE — Patient Care Conference (Signed)
Inpatient RehabilitationTeam Conference and Plan of Care Update Date: 07/05/2017   Time: 10:45 AM    Patient Name: Ronald [name]      Medical Record Number: [id number]  Date of Birth: 1940/07/09 Sex: Male         Room/Bed: 4W20C/4W20C-01 Payor Info: Payor: MEDICARE / Plan: MEDICARE PART A AND B / Product Type: *No Product type* /    Admitting Diagnosis: CVA  Admit Date/Time:  06/23/2017  3:27 PM Admission Comments: No comment available   Primary Diagnosis:  Parietal lobe infarction Principal Problem: Parietal lobe infarction  Patient Active Problem List   Diagnosis Date Noted  . Benign essential HTN   . Hypertensive crisis   . Dysarthria, post-stroke   . Acute pain of right shoulder   . 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 Dysphagia, oropharyngeal 06/23/2017  . Respiratory failure (HCC)   . Palliative care encounter   . Aspiration pneumonia due to gastric secretions (HCC)   . Atrial fibrillation (HCC)   . 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 Claudette Laws Social Worker Present: Dossie Der, LCSW Nurse Present: Willey Blade, RN PT Present: Harless Litten, PTA;Grier Rocher, PT OT Present: Johnsie Cancel, OT SLP Present: Reuel Derby, SLP PPS Coordinator present : Tora Duck, RN, CRRN     Current Status/Progress Goal Weekly Team Focus  Medical   Left hemiparesis improving, severe dysarthria and dysphagia  tolerate bolus feed, family Mod I with administration  initiate PEG bolus feeds and start warfarin   Bowel/Bladder   Continent of B&B. BM 3/5  Maintain continence.  Assess toileting needs q shift and PRN.   Swallow/Nutrition/ Hydration   NPO  Total A swallow initiation with oral stim  repeat MBS, swallow initiation with oral stim   ADL's   Close supervision for functional ambulation; can require VCs for clothing orientation  Supervision overall  ADL  re-training; safety awarenss, L neuro re-ed; family education; d/c planning   Mobility   supervision bed mobility, gait min guard noAD, decreased safety awareness, improving endurance  supervision  endurance, family ed, L NMR   Communication   Max A   Max A - goals downgraded  picture selection   Safety/Cognition/ Behavioral Observations  Max A  Max A - goal downgraded 3/6  basic problem solving, left attention   Pain   C/O left shoulder pain. Tylenol and Voltaren effective.  <3  Assess pain q shift and prn.   Skin   Scattered bruising and new g-tube site.  Healing of new g-tube site without complication.  Assess q shift and prn.      *See Care Plan and progress notes for long and short-term goals.     Barriers to Discharge  Current Status/Progress Possible Resolutions Date Resolved   Physician    Medical stability;Nutrition means     PEG placed 3/5  see above , dietary consult for bolus feeds, pharm consult for warfarin      Nursing                  PT                    OT                  SLP                SW  Discharge Planning/Teaching Needs:  Daughter working on a plan, FMLA forms completed and given back to daughter. PEG placed yesterday      Team Discussion:  Making good progress in mobility and ADL's. PEG placed yesterday and have not begun feedings yet-takes 24 hr. Coumadin started after CT scan showed some resolution of hemorrhage. Oral care helping with thrush and mouth soreness. PEG site sore. Will need 24 hr supervision with cueing for safety. Family education with daughter tomorrow.  Revisions to Treatment Plan:  DC 3/9    Continued Need for Acute Rehabilitation Level of Care: The patient requires daily medical management by a physician with specialized training in physical medicine and rehabilitation for the following conditions: Daily direction of a multidisciplinary physical rehabilitation program to ensure safe treatment while eliciting  the highest outcome that is of practical value to the patient.: Yes Daily medical management of patient stability for increased activity during participation in an intensive rehabilitation regime.: Yes Daily analysis of laboratory values and/or radiology reports with any subsequent need for medication adjustment of medical intervention for : Neurological problems;Wound care problems;Post surgical problems;Nutritional problems  Lucy Chris 07/05/2017, 12:59 PM

## 2017-07-05 NOTE — Progress Notes (Addendum)
ANTICOAGULATION CONSULT NOTE - Initial Consult  Pharmacy Consult for warfarin Indication: atrial fibrillation  No Known Allergies  Patient Measurements: Height: 5\' 11"  (180.3 cm) Weight: 158 lb 4.6 oz (71.8 kg) IBW/kg (Calculated) : 75.3 Heparin Dosing Weight:   Vital Signs: Temp: 98 F (36.7 C) (03/06 0351) Temp Source: Oral (03/06 0351) BP: 138/98 (03/06 0351) Pulse Rate: 130 (03/06 0351)  Labs: Recent Labs    07/04/17 0553  HGB 13.9  HCT 40.0  PLT 448*  LABPROT 15.2  INR 1.21  CREATININE 0.68    Medical History: Past Medical History:  Diagnosis Date  . COPD (chronic obstructive pulmonary disease) (HCC)   . Emphysema lung (HCC)   . Hx of long term use of blood thinners   . Hypertension   . Stroke [facility](HCC) 2013  . Thyroid disease    hypothyroidism    Assessment: 77 yo male on warfarin prior to admission for AFib, but was admitted with subtherapeutic INR; unclear if this was due to poor compliance or inappropriate dose. Regardless, the patient was admitted with an ischemic stroke, he received tPA and unfortunately had a hemorrhagic transformation. Now a couple weeks out, planning to resume warfarin. INR 1.21.  PTA warfarin dose: 5 mg po daily  Goal of Therapy:  INR 2-3 Monitor platelets by anticoagulation protocol: Yes   Plan:  -Warfarin 7.5 mg po x1 -Daily INR -Watch drug interaction with fluconazole   Ronald [name], Ronald [name] M 07/05/2017,5:58 AM

## 2017-07-05 NOTE — Progress Notes (Signed)
Social Work   Leverett Camplin, Elveria Rising  Social Worker  Physical Medicine and Rehabilitation  Patient Care Conference  Signed  Date of Service:  07/05/2017 12:59 PM          Signed          [] Hide copied text  [] Hover for details   Inpatient RehabilitationTeam Conference and Plan of Care Update Date: 07/05/2017   Time: 10:45 AM      Patient Name: FELIBERTO GEIGEL      Medical Record Number: [id number]  Date of Birth: 06/10/1940 Sex: Male         Room/Bed: 4W20C/4W20C-01 Payor Info: Payor: MEDICARE / Plan: MEDICARE PART A AND B / Product Type: *No Product type* /     Admitting Diagnosis: CVA  Admit Date/Time:  06/23/2017  3:27 PM Admission Comments: No comment available    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 Benign essential HTN    . Hypertensive crisis    . Dysarthria, post-stroke    . Acute pain of right shoulder    . 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 Dysphagia, oropharyngeal 06/23/2017  . Respiratory failure (HCC)    . Palliative care encounter    . Aspiration pneumonia due to gastric secretions (HCC)    . Atrial fibrillation (HCC)    . 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 Claudette Laws Social Worker Present: Dossie Der, LCSW Nurse Present: Willey Blade, RN PT Present: Harless Litten, PTA;Grier Rocher, PT OT Present: Johnsie Cancel, OT SLP Present: Reuel Derby, SLP PPS Coordinator present : Tora Duck, RN, CRRN       Current Status/Progress Goal Weekly Team Focus  Medical     Left hemiparesis improving, severe dysarthria and dysphagia  tolerate bolus feed, family Mod I with administration  initiate PEG bolus feeds and start warfarin   Bowel/Bladder     Continent of B&B. BM 3/5  Maintain continence.  Assess toileting  needs q shift and PRN.   Swallow/Nutrition/ Hydration     NPO  Total A swallow initiation with oral stim  repeat MBS, swallow initiation with oral stim   ADL's     Close supervision for functional ambulation; can require VCs for clothing orientation  Supervision overall  ADL re-training; safety awarenss, L neuro re-ed; family education; d/c planning   Mobility     supervision bed mobility, gait min guard noAD, decreased safety awareness, improving endurance  supervision  endurance, family ed, L NMR   Communication     Max A   Max A - goals downgraded  picture selection   Safety/Cognition/ Behavioral Observations   Max A  Max A - goal downgraded 3/6  basic problem solving, left attention   Pain     C/O left shoulder pain. Tylenol and Voltaren effective.  <3  Assess pain q shift and prn.   Skin     Scattered bruising and new g-tube site.  Healing of new g-tube site without complication.  Assess q shift and prn.     *See Care Plan and progress notes for long and short-term goals.      Barriers to Discharge   Current Status/Progress Possible Resolutions Date Resolved   Physician     Medical stability;Nutrition means  PEG placed 3/5  see above , dietary consult for bolus feeds, pharm consult for warfarin      Nursing                 PT                    OT                 SLP            SW              Discharge Planning/Teaching Needs:  Daughter working on a plan, FMLA forms completed and given back to daughter. PEG placed yesterday      Team Discussion:  Making good progress in mobility and ADL's. PEG placed yesterday and have not begun feedings yet-takes 24 hr. Coumadin started after CT scan showed some resolution of hemorrhage. Oral care helping with thrush and mouth soreness. PEG site sore. Will need 24 hr supervision with cueing for safety. Family education with daughter tomorrow.  Revisions to Treatment Plan:  DC 3/9    Continued Need for Acute Rehabilitation  Level of Care: The patient requires daily medical management by a physician with specialized training in physical medicine and rehabilitation for the following conditions: Daily direction of a multidisciplinary physical rehabilitation program to ensure safe treatment while eliciting the highest outcome that is of practical value to the patient.: Yes Daily medical management of patient stability for increased activity during participation in an intensive rehabilitation regime.: Yes Daily analysis of laboratory values and/or radiology reports with any subsequent need for medication adjustment of medical intervention for : Neurological problems;Wound care problems;Post surgical problems;Nutritional problems   Lucy Chris 07/05/2017, 12:59 PM                  Erric Machnik, Lemar Livings, LCSW  Social Worker  Physical Medicine and Rehabilitation  Patient Care Conference  Signed  Date of Service:  06/28/2017 11:57 AM          Signed          [] Hide copied text  [] Hover for details   Inpatient RehabilitationTeam Conference and Plan of Care Update Date: 06/28/2017   Time: 10:30 AM      Patient Name: PHILL MATHWIG      Medical Record Number: [id number]  Date of Birth: 09-13-1940 Sex: Male         Room/Bed: 4W20C/4W20C-01 Payor Info: Payor: MEDICARE / Plan: MEDICARE PART A AND B / Product Type: *No Product type* /     Admitting Diagnosis: CVA  Admit Date/Time:  06/23/2017  3:27 PM Admission Comments: No comment available    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 Dysphagia, oropharyngeal 06/23/2017  . Respiratory failure (HCC)    . Palliative care encounter    . Aspiration pneumonia due to gastric secretions (HCC)    . Atrial fibrillation (HCC)    . 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 as well as history of left MCA infarction with residual aphasia  Improve mobility, cognition, started meds Afib, dysphagia  See above   Bowel/Bladder     cont b&b; lbm 2/27  maintain  assess q shift and prn   Swallow/Nutrition/ Hydration     NPO  Min A  trials if ice chips prior to MBS (end of week?)   ADL's     Min A functional standing balanace during ADLs, min A functional transfers, VCs UB bathing/dressing and grooming, poor safety awareness  Supervision overall  Safety awareness; L neuro re-ed; L  attention; ADL re-training; functional standing balance/endurance   Mobility     supervision bed mobility, gait HHA, min guard w/c mobility, fair dynamic balance, good sitting balance, decreased safety awareness, decreased endurance  supervision  balance, endurance, safety awareness, family ed   Communication     Max A  Min A  repeating syllable and word level, expressing want/needs via multimodal (guesture, yes/no)   Safety/Cognition/ Behavioral Observations   impulsive; may attempt self-transfer  transfer with min assist  bed alarm; call bell within reach; one assist with transfer   Pain     c/o back pain; tylenol  <4  assess q shift and prn   Skin     scattered bruising  free from infection and further breakdown  assess q shift and prn     *See Care Plan and progress notes for long and short-term goals.      Barriers to Discharge   Current Status/Progress Possible Resolutions Date Resolved   Physician      Medical stability  Dysphagia  See above  Therapies, safety for aspiration, meds for Afib started      Nursing                 PT  Medical stability                 OT                 SLP            SW Decreased caregiver support Daughter will need to hire caregiver during the day while she works             Discharge Planning/Teaching Needs:  Home to daughter's who she is working on arranging 24 hr care for Dad. Applying for Medicaid this week      Team Discussion:  Goals supervision-min assist level. MBS on Friday better with managing his secretions. Off O2 now. Chronic back pain-MD addressing. Decreased endurance and Left attention. Impulsive and safety issues due to moves quickly. Nursing working on calling when needs to use the bathroom.  Revisions to Treatment Plan:  DC 3/9    Continued Need for Acute Rehabilitation Level of Care: The patient requires daily medical management by a physician with specialized training in physical medicine and rehabilitation for the following conditions: Daily direction of a multidisciplinary physical rehabilitation program to ensure safe treatment while eliciting the highest outcome that is of practical value to the patient.: Yes Daily medical management of patient stability for increased activity during participation in an intensive rehabilitation regime.: Yes Daily analysis of laboratory values and/or radiology reports with any subsequent need for medication adjustment of medical intervention for : Neurological problems;Other;Cardiac problems   Tabria Steines, Lemar Livings 06/28/2017,  11:58 AM                 Patient ID: EGIDIO SHELLHOUSE, male   DOB: Oct 08, 1940, 77 y.o.   MRN: [id number]

## 2017-07-05 NOTE — Progress Notes (Signed)
Speech Language Pathology Daily Session Note  Patient Details  Name: Ronald [name] MRN: [id number] Date of Birth: July 10, 1940  Today's Date: 07/05/2017 SLP Individual Time: [id number] SLP Individual Time Calculation (min): 45 min  Short Term Goals: Week 2: SLP Short Term Goal 1 (Week 2): Pt will facilitate swallow initiation in 5 out of 10 opportunities with oral stimulation and Max A cues.  SLP Short Term Goal 2 (Week 2): Pt will communicate wants/needs via multimodal communication including gestures, communication board, and verbal attempts given Max A verbal cues.  SLP Short Term Goal 3 (Week 2): Pt will complete 3 reps of CTAR with Max A cues.  SLP Short Term Goal 4 (Week 2): Pt will follow functional 2-step commands within context given Max A verbal and visual cues.  SLP Short Term Goal 5 (Week 2): Pt will imitate single-syllable functional words with ~50% intelligibility given Max A model prompts.   Skilled Therapeutic Interventions: Skilled treatment session focused on dysphagia and prefunctional multi-modal communication goals. SLP facilitated session by providing Total A stimulation for swallow initiation with oral stim. In 20 minutes of oral stim but was able to produce 2 swallows. Pt with total/very intense effort but unable to initiate any further swallows. SLP further facilitated session by providing prefunctional object selection/picture selection tasks to assess for ability to possibility use pictures as multimodal communication. Pt able to name simple/common objects (severely dysarthric). He was able to select requested object in field of 2. With Max A cues, pt was able to select requested picture of action with 70% accuracy with pictures stacked vertically to compensate for left inattention. Pt was not able to select black and white object pictures or black and while pictures of actions (with Max A cues). Pt was returned to room and required Max A cues to prevent bumping items to the left  of his environment. Pt was left in bed, bed alarm on and all needs within reach. Continue per current plan of care.      Function:    Cognition Comprehension Comprehension assist level: Understands basic 50 - 74% of the time/ requires cueing 25 - 49% of the time  Expression   Expression assist level: Expresses basis less than 25% of the time/requires cueing >75% of the time.  Social Interaction Social Interaction assist level: Interacts appropriately less than 25% of the time. May be withdrawn or combative.  Problem Solving Problem solving assist level: Solves basic less than 25% of the time - needs direction nearly all the time or does not effectively solve problems and may need a restraint for safety  Memory Memory assist level: Recognizes or recalls less than 25% of the time/requires cueing greater than 75% of the time    Pain    Therapy/Group: Individual Therapy  Ymani Porcher 07/05/2017, 12:38 PM

## 2017-07-06 ENCOUNTER — Inpatient Hospital Stay (HOSPITAL_COMMUNITY): Payer: Self-pay | Admitting: Occupational Therapy

## 2017-07-06 ENCOUNTER — Inpatient Hospital Stay (HOSPITAL_COMMUNITY): Payer: Self-pay | Admitting: Physical Therapy

## 2017-07-06 ENCOUNTER — Inpatient Hospital Stay (HOSPITAL_COMMUNITY): Payer: Self-pay | Admitting: Speech Pathology

## 2017-07-06 LAB — PROTIME-INR
INR: 1.31
Prothrombin Time: 16.2 seconds — ABNORMAL HIGH (ref 11.4–15.2)

## 2017-07-06 MED ORDER — PANTOPRAZOLE SODIUM 40 MG PO PACK: 40.0000 mg | PACK | Freq: Every day | ORAL | 1 refills | Status: DC

## 2017-07-06 MED ORDER — LISINOPRIL 20 MG PO TABS: 20.0000 mg | ORAL_TABLET | Freq: Every day | ORAL | 1 refills | Status: DC

## 2017-07-06 MED ORDER — DICLOFENAC SODIUM 1 % TD GEL: 2.0000 g | Freq: Four times a day (QID) | TRANSDERMAL | 1 refills | Status: DC

## 2017-07-06 MED ORDER — WARFARIN SODIUM 5 MG PO TABS
5.0000 mg | ORAL_TABLET | Freq: Once | ORAL | Status: AC
  Administered 2017-07-06: 5 mg via ORAL
  Filled 2017-07-06: qty 1

## 2017-07-06 MED ORDER — LEVOTHYROXINE SODIUM 125 MCG PO TABS: 125.0000 ug | ORAL_TABLET | Freq: Every day | ORAL | 1 refills | Status: DC

## 2017-07-06 MED ORDER — NICOTINE 21 MG/24HR TD PT24: MEDICATED_PATCH | TRANSDERMAL | 0 refills | Status: DC

## 2017-07-06 MED ORDER — FREE WATER: 200.0000 mL | Freq: Four times a day (QID) | Status: DC

## 2017-07-06 MED ORDER — JEVITY 1.5 CAL/FIBER PO LIQD: 300.0000 mL | Freq: Four times a day (QID) | ORAL | 1 refills | Status: DC

## 2017-07-06 MED ORDER — ATORVASTATIN CALCIUM 40 MG PO TABS: 40.0000 mg | ORAL_TABLET | Freq: Every day | ORAL | 1 refills | Status: DC

## 2017-07-06 NOTE — Progress Notes (Signed)
Teaching with medications and tube feedings completed today with pt daughter.  Return demonstration completed as well as daughter verbalizing an understanding of administering medications and feedings via PEG tube.

## 2017-07-06 NOTE — Progress Notes (Signed)
ANTICOAGULATION CONSULT NOTE - Initial Consult  Pharmacy Consult for warfarin Indication: atrial fibrillation  No Known Allergies  Patient Measurements: Height: 5\' 11"  (180.3 cm) Weight: 158 lb 4.6 oz (71.8 kg) IBW/kg (Calculated) : 75.3 Heparin Dosing Weight:   Vital Signs: Temp: 98.3 F (36.8 C) (03/07 0256) Temp Source: Oral (03/07 0256) BP: 145/85 (03/07 0256) Pulse Rate: 89 (03/07 0256)  Labs: Recent Labs    07/04/17 0553 07/06/17 0544  HGB 13.9  --   HCT 40.0  --   PLT 448*  --   LABPROT 15.2 16.2*  INR 1.21 1.31  CREATININE 0.68  --     Medical History: Past Medical History:  Diagnosis Date  . COPD (chronic obstructive pulmonary disease) (HCC)   . Emphysema lung (HCC)   . Hx of long term use of blood thinners   . Hypertension   . Stroke [facility](HCC) 2013  . Thyroid disease    hypothyroidism    Assessment: 77 yo male on warfarin prior to admission for AFib, but was admitted with subtherapeutic INR; unclear if this was due to poor compliance or inappropriate dose. Regardless, the patient was admitted with an ischemic stroke, he received tPA and unfortunately had a hemorrhagic transformation. Now a couple weeks out, and restarted coumadin on 3/6. INR 1.31 today. Trending up slowly. Noted on po fluconazole (2/28 >> 3/13) for extensive oropharangeal candidiasis/thrush.  PTA warfarin dose: 5 mg po daily  Goal of Therapy:  INR 2-3 Monitor platelets by anticoagulation protocol: Yes   Plan:  -Warfarin 5 mg po x1 -Daily INR -Watch drug interaction with fluconazole  Bayard HuggerMei Raydon Chappuis, PharmD, BCPS  Clinical Pharmacist  Pager: [phone]   07/06/2017,12:30 PM

## 2017-07-06 NOTE — Progress Notes (Signed)
Speech Language Pathology Daily Session Note  Patient Details  Name: Ronald [name] MRN: [id number] Date of Birth: [birth year]  Today's Date: 07/06/2017 SLP Individual Time: 1015-1100 SLP Individual Time Calculation (min): 45 min  Short Term Goals: Week 2: SLP Short Term Goal 1 (Week 2): Pt will facilitate swallow initiation in 5 out of 10 opportunities with oral stimulation and Max A cues.  SLP Short Term Goal 2 (Week 2): Pt will communicate wants/needs via multimodal communication including gestures, communication board, and verbal attempts given Max A verbal cues.  SLP Short Term Goal 3 (Week 2): Pt will complete 3 reps of CTAR with Max A cues.  SLP Short Term Goal 4 (Week 2): Pt will follow functional 2-step commands within context given Max A verbal and visual cues.  SLP Short Term Goal 5 (Week 2): Pt will imitate single-syllable functional words with ~50% intelligibility given Max A model prompts.   Skilled Therapeutic Interventions: Skilled treatment session focused on completing education with pt's daughter. Education provided on severe swallow initiation deficits, severe left inattention, severe dysarthria at the word level and prognosis for return to PO intake is guarded. Pt with wet respirations this session and inability to clear d/t rib pain (prior to PEG placement) and now abdomen pain from PEG placement. Daughter expresses desire for Outpatient therapy. CSW made aware of request and will follow up.      Function:    Cognition Comprehension Comprehension assist level: Understands basic 50 - 74% of the time/ requires cueing 25 - 49% of the time  Expression   Expression assist level: Expresses basis less than 25% of the time/requires cueing >75% of the time.  Social Interaction Social Interaction assist level: Interacts appropriately less than 25% of the time. May be withdrawn or combative.  Problem Solving Problem solving assist level: Solves basic 25 - 49% of the time - needs  direction more than half the time to initiate, plan or complete simple activities  Memory Memory assist level: Recognizes or recalls less than 25% of the time/requires cueing greater than 75% of the time    Pain    Therapy/Group: Individual Therapy  [name] 07/06/2017, 11:23 AM

## 2017-07-06 NOTE — Progress Notes (Signed)
Occupational Therapy Session Note  Patient Details  Name: Ronald [name] L Townsend MRN: [id number] Date of Birth: [birth year]  Today's Date: 07/06/2017 OT Individual Time: 0845-1000 OT Individual Time Calculation (min): 75 min    Short Term Goals: Week 2:  OT Short Term Goal 1 (Week 2): STG=LTG due to LOS  Skilled Therapeutic Interventions/Progress Updates:    PT seen for OT session focusing on functional transfers and family education. Pt in supine upon arrival, agreeable to tx session though declining bathing this morning, but desiring to change clothes. He ambulated in room to gather clothing items from closet with close supervision. He dressed seated EOB, required multimodal cuing for clothing orientation of underwear, able to orient shirt and pants correctly with increased time. Grooming tasks completed standing at sink with supervision/VCs for proper set-up of items as pt attempting to apply Deoderant with cap still on and shave with razor guard still applied.  Pt's daughter not present as beginning of session for scheduled family ed session. Therefore, pt taken to ADL apartment and completed simulated tub/shower transfer using tub bench and second trial without DME. Following demonstration and max multimodal cuing for sequencing/technique of both transfers, pt return demonstrated at min A overall for both transfers. Pt with decreased L foot clearance when stepping over tub wall. He then completed simulated shower stall transfer as not sure which layout pt will have at home. Shower stall transfer completed with guarding assist and VCs for safety as pt sitting prematurely prior to arrival to shower chair and again dragging L foot on threshold.  Pt's daughter arrived with 20 minutes left in session. Returned to ADL apartment and completed simulated tub/shower transfer as this is what pt has at home. Utilized tub transfer bench as was done previously in session, pt unable to recall technique and required max  multimodal cuing for proper technique/sequencing of transfer. Extensive education provided to pt's daughter regarding current deficits and functional implications including L inattention/neglect, global asphasia, and cognitive and visual deficits.  Pt's daughter voiced understanding and willingness to provide needed assist at d/c including close 24 hr supervision.  Pt returned to room at end of session, left in supine with all needs in reach and daughter present.  Pt ambulated throughout unit with close supervision, intermittent cuing for awareness of environmental obstacles on L during ambulation.   Therapy Documentation Precautions:  Precautions Precautions: Fall Precaution Comments: NPO  NG tube, left neglect/visual field deficit, global aphasia Restrictions Weight Bearing Restrictions: No Pain:   No/denies pain  See Function Navigator for Current Functional Status.   Therapy/Group: Individual Therapy  Melenda Bielak L 07/06/2017, 6:54 AM

## 2017-07-06 NOTE — Progress Notes (Signed)
Subjective/Complaints: Overall doing fairly well.  Still a bit uncomfortable in the area of his PEG.  Able to sleep last night.   ROS: limited due to language/communication   Objective: Vital Signs: Blood pressure (!) 145/85, pulse 89, temperature 98.3 F (36.8 C), temperature source Oral, resp. rate 20, height _0  (1.803 m), weight 71.8 kg (158 lb 4.6 oz), SpO2 95 %. Ir Gastrostomy Tube Mod Sed  Result Date: 07/04/2017 CLINICAL DATA:  Stroke, inadequate PO intake, needs enteral feeding support EXAM: PERC PLACEMENT GASTROSTOMY FLUOROSCOPY TIME:  1.5 minute; 604 uGym2 DAP TECHNIQUE: The procedure, risks, benefits, and alternatives were explained to the patient. Questions regarding the procedure were encouraged and answered. The patient understands and consents to the procedure. As antibiotic prophylaxis, cefazolin 2 g was ordered pre-procedure and administered intravenously within one hour of incision. Review of recent CT demonstrated safe percutaneous approach for gastrostomy. A 5 French angiographic catheter was placed as orogastric tube. The upper abdomen was prepped with Betadine, draped in usual sterile fashion, and infiltrated locally with 1% lidocaine. Intravenous Fentanyl and Versed were administered as conscious sedation during continuous monitoring of the patient's level of consciousness and physiological / cardiorespiratory status by the radiology RN, with a total moderate sedation time of 8 minutes. 0.5 mg glucagon given IV to facilitate gastric distention.Stomach was insufflated using air through the orogastric tube. An 51 French sheath needle was advanced percutaneously into the gastric lumen under fluoroscopy. Gas could be aspirated and a small contrast injection confirmed intraluminal spread. The sheath was exchanged over a guidewire for a 9 [place] vascular sheath, through which the snare device was advanced and used to snare a guidewire passed through the orogastric tube. This was  withdrawn, and the snare attached to the 20 French pull-through gastrostomy tube, which was advanced antegrade, positioned with the internal bumper securing the anterior gastric wall to the anterior abdominal wall. Small contrast injection confirms appropriate positioning. The external bumper was applied and the catheter was flushed. COMPLICATIONS: COMPLICATIONS none IMPRESSION: 1. Technically successful 20 French pull-through gastrostomy placement under fluoroscopy. Electronically Signed   By: Lucrezia Europe M.D.   On: 07/04/2017 17:14   Results for orders placed or performed during the hospital encounter of 06/23/17 (from the past 72 hour(s))  CBC     Status: Abnormal   Collection Time: 07/04/17  5:53 AM  Result Value Ref Range   WBC 6.3 4.0 - 10.5 K/uL   RBC 4.05 (L) 4.22 - 5.81 MIL/uL   Hemoglobin 13.9 13.0 - 17.0 g/dL   HCT 40.0 39.0 - 52.0 %   MCV 98.8 78.0 - 100.0 fL   MCH 34.3 (H) 26.0 - 34.0 pg   MCHC 34.8 30.0 - 36.0 g/dL   RDW 13.4 11.5 - 15.5 %   Platelets 448 (H) 150 - 400 K/uL    Comment: Performed at [facility], [place] [address] [place], [place] [address]  Protime-INR     Status: None   Collection Time: 07/04/17  5:53 AM  Result Value Ref Range   Prothrombin Time 15.2 11.4 - 15.2 seconds   INR 1.21     Comment: Performed at [facility] [address] [place], [place] [address]  Basic metabolic panel     Status: Abnormal   Collection Time: 07/04/17  5:53 AM  Result Value Ref Range   Sodium 136 135 - 145 mmol/L   Potassium 4.2 3.5 - 5.1 mmol/L   Chloride 103 101 - 111 mmol/L  CO2 24 22 - 32 mmol/L   Glucose, Bld 102 (H) 65 - 99 mg/dL   BUN 15 6 - 20 mg/dL   Creatinine, Ser 0.68 0.61 - 1.24 mg/dL   Calcium 9.4 8.9 - 10.3 mg/dL   GFR calc non Af Amer >60 >60 mL/min   GFR calc Af Amer >60 >60 mL/min    Comment: (NOTE) The eGFR has been calculated using the CKD EPI equation. This calculation has not been validated in all clinical situations. eGFR's  persistently <60 mL/min signify possible Chronic Kidney Disease.    Anion gap 9 5 - 15    Comment: Performed at [facility] [address], [place], [place] [address]  Protime-INR     Status: Abnormal   Collection Time: 07/06/17  5:44 AM  Result Value Ref Range   Prothrombin Time 16.2 (H) 11.4 - 15.2 seconds   INR 1.31     Comment: Performed at [facility] [address] [place], [place] [address]     HEENT: Normocephalic, atraumatic.Small to moderate Whitish plaque on tongue, no plaques in the pharyngeal area, no plaques in buccal area Cardio: Irregularly irregular without JVD or murmur . Resp: CTA Bilaterally without wheezes or rales. Normal effort  GI: Bowel sounds are positive but patient slightly tender near PEG site.  Site itself is clean and intact. Skin:   Scattered bruises Neuro: Limited due to severe dysarthria and aphasia Motor:  4/5 on Left sided 5/5 on right side--exam stable Musc/Skel:  No edema. No tenderness.  No pain with PROM right shoulder Gen NAD. Vital signs reviewed Psych: Pleasant and appropriate  Assessment/Plan: 1. 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 Physiatrist is providing close team supervision and 24 hour management of active medical problems listed below. Physiatrist and rehab team continue to assess barriers to discharge/monitor patient progress toward functional and medical goals. FIM: Function - Bathing Position: Shower Body parts bathed by patient: Left arm, Chest, Abdomen, Front perineal area, Buttocks, Right upper leg, Left upper leg, Right lower leg, Left lower leg, Right arm Body parts bathed by helper: Back Assist Level: Supervision or verbal cues  Function-  Upper Body Dressing/Undressing What is the patient wearing?: Hospital gown Pull over shirt/dress - Perfomed by patient: Thread/unthread right sleeve, Put head through opening, Pull shirt over trunk, Thread/unthread left sleeve Pull over shirt/dress - Perfomed by helper: Thread/unthread left sleeve Button up shirt - Perfomed by patient: Thread/unthread right sleeve Button up shirt - Perfomed by helper: Thread/unthread left sleeve, Pull shirt around back Assist Level: Supervision or verbal cues Function - Lower Body Dressing/Undressing What is the patient wearing?: Hospital Gown, Non-skid slipper socks, Shoes, Underwear Position: Sitting EOB Underwear - Performed by patient: Thread/unthread right underwear leg, Thread/unthread left underwear leg, Pull underwear up/down Underwear - Performed by helper: Pull underwear up/down Pants- Performed by patient: Thread/unthread right pants leg, Thread/unthread left pants leg, Pull pants up/down Pants- Performed by helper: Pull pants up/down Non-skid slipper socks- Performed by patient: Don/doff right sock, Don/doff left sock Socks - Performed by patient: Don/doff right sock, Don/doff left sock Socks - Performed by helper: Don/doff right sock, Don/doff left sock Shoes - Performed by patient: Don/doff right shoe, Don/doff left shoe(Slip ons) Assist for footwear: Supervision/touching assist Assist for lower  body dressing: Touching or steadying assistance (Pt > 75%)  Function - Toileting Toileting steps completed by patient: Adjust clothing prior to toileting, Performs perineal hygiene, Adjust clothing after toileting Toileting steps completed by helper: Performs perineal hygiene, Adjust clothing after toileting Toileting Assistive Devices: Grab bar or rail Assist level: Touching or steadying assistance (Pt.75%)  Function - Air cabin crew transfer assistive device: Walker, Grab bar Assist level to toilet: Touching or steadying assistance (Pt >  75%) Assist level from toilet: Touching or steadying assistance (Pt > 75%)  Function - Chair/bed transfer Chair/bed transfer method: Ambulatory Chair/bed transfer assist level: Supervision or verbal cues Chair/bed transfer assistive device: Bedrails, Armrests Chair/bed transfer details: Verbal cues for precautions/safety, Verbal cues for technique, Verbal cues for sequencing  Function - Locomotion: Wheelchair Will patient use wheelchair at discharge?: No Function - Locomotion: Ambulation Assistive device: No device Max distance: 211f  Assist level: Supervision or verbal cues Assist level: Supervision or verbal cues Assist level: Supervision or verbal cues Assist level: Supervision or verbal cues Walk 10 feet on uneven surfaces activity did not occur: Safety/medical concerns Assist level: Touching or steadying assistance (Pt > 75%)  Function - Comprehension Comprehension: Auditory Comprehension assist level: Understands basic 50 - 74% of the time/ requires cueing 25 - 49% of the time  Function - Expression Expression: Verbal Expression assist level: Expresses basis less than 25% of the time/requires cueing >75% of the time.  Function - Social Interaction Social Interaction assist level: Interacts appropriately less than 25% of the time. May be withdrawn or combative.  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 DVT Prophylaxis/Anticoagulation: SCD's only given hemorrhage.Monitor for signs of DVT -Dopplers negative 3. Pain Management:Tylenol as needed   Voltaren gel ordered for right shoulder 4. Mood:Provide emotional support 5. Neuropsych: This patientiscapable of making decisions on hisown behalf. 6. Skin/Wound Care:Routine skin checks 7. Fluids/Electrolytes/Nutrition:Routine I&Os   BMP within acceptable range on 2/25 8. Dysphagia.NPO. Cortrakfor nutritional support.  Repeat modified barium swallow, no improvement with swallowing.   -PEG in place and functioning.  Patient tolerating fairly well so far  10.Hypothyroidism. Synthroid 11.Hyperlipidemia.Lipitor 12.Atrial fibrillation. Cardiac rate controlled. Chronic Coumadin on hold d/t hemorrhage.  Discontinue ASA 879m, may resume warfarin 13.Tobacco abuse. Counseling 14.  Hypoalbuminemia : pro-stat 15. HTN    Vitals:   07/05/17 1328 07/06/17 0256  BP: (!) 162/81 (!) 145/85  Pulse: 98 89  Resp: 18 20  Temp: 98.3 F (36.8 C) 98.3 F (36.8 C)  SpO2: 94% 95%     Lisinopril 5 started on 2/27, will increase to 10 mg on 06/30/2017, increase to 2054m3/6--with some improvement thus far.  Continue to observe 16.  Poor oral hygiene secondary to n.p.o. status and patient's inability to perform oral hygiene.Mouth and pharynx looking much better. Continue  aggressive oral care.  LOS (Days) 13 A FACE TO FACE EVALUATION WAS PERFORMED  SWARTZ,ZACHARY T 07/06/2017, 9:43 AM

## 2017-07-06 NOTE — Discharge Summary (Signed)
Discharge summary job # [id number]

## 2017-07-06 NOTE — Progress Notes (Addendum)
Physical Therapy Session Note  Patient Details  Name: Ronald [name] MRN: [id number] Date of Birth: [birth year]  Today's Date: 07/06/2017 PT Individual Time: 1100-1200 AND 1430-1500 PT Individual Time Calculation (min): 60 min AND 30 min   Short Term Goals: Week 1:  PT Short Term Goal 1 (Week 1): Pt will ambulate 150' with LRAD and min assist PT Short Term Goal 1 - Progress (Week 1): Met PT Short Term Goal 2 (Week 1): Pt will negotiate 4 steps with 1 rail and min assist PT Short Term Goal 2 - Progress (Week 1): Met PT Short Term Goal 3 (Week 1): Pt will complete standardized balance assessment.  PT Short Term Goal 3 - Progress (Week 1): Met Week 2:  PT Short Term Goal 1 (Week 2): STG =LTG due to ELOS  Skilled Therapeutic Interventions/Progress Updates:   Pt received supine in bed and agreeable to PT. Supine>sit transfer with supervision assist  Daughter present for family education.   Gait training in hall with supervision assist from PT x 200, 164f, 1551f Cues from PT for safety awareness to pt to improve visual scanning L as well as cues to daughter for how to safely guard pt and prevent falls.   Stair training with supervision assist from PT x 8 and x 4 with assist from daughter. Min cues from PT for proper guarding technique and how to cue pt to attend to L rail.   Car transfer with supervision assist from PT min cues for pt and family for improved safety .   PT Instructed pt and daughter in how to provide min assist for ambulation in simulated community environment of hospital gift shop and food court x 30075f Pt educated in [name]. Moderate cues for technique as well as set up and safety concerns for each exercise.   Pt returned to room and performed ambulatory transfer to bed with supervision assist. Sit>supine completed with supervision assist, and pt left supine in bed with call bell in reach and all needs met.    Session 2.   Pt received supine in  bed and agreeable to PT. Supine>sit transfer with supervision  Assist. Gait training within rehab unit x [id number] 300ft.69fh finding through unit to find semi-familiar objects and locations. Pt able to repeat name of target 75% of the time, but required max cues to problem solve when objects or location required visual scanning to the L.   Pt returned to room and performed ambulatory transfer to bed with supervisoin assist. Sit>supine completed with supervision assist, and pt left supine in bed with call bell in reach and all needs met.           Therapy Documentation Precautions:  Precautions Precautions: Fall Precaution Comments: NPO  NG tube, left neglect/visual field deficit, global aphasia Restrictions Weight Bearing Restrictions: No Vital Signs: Therapy Vitals Temp: 97.7 F (36.5 C) Temp Source: Oral Pulse Rate: (!) 103 Resp: 18 BP: 126/80 Patient Position (if appropriate): Lying Oxygen Therapy SpO2: 98 % O2 Device: Room Air Pain:  Faces: hurts a little.   See Function Navigator for Current Functional Status.   Therapy/Group: Individual Therapy  AustinLorie Phenix019, 3:53 PM

## 2017-07-06 NOTE — Progress Notes (Signed)
Social Work Patient ID: Ronald [name], male   DOB: 12/17/1940, 77 y.o.   MRN: [id number]  Daughter is here to go through therapies and learn pt's care. She is an Charity fundraiserN and comfortable with his care and will have someone with him at home. She would like OP therapies and  Have made referral for this and they will contact her to set up appointment. In the meantime have arranged [facility] to see him until can get into OP. Daughter prefers this agency. Have ordered jevity, [place] suction, bedside commode and tub bench for home. To contact daughter to set up delivery for tomorrow to home. Work toward discharge Sat.

## 2017-07-07 ENCOUNTER — Inpatient Hospital Stay (HOSPITAL_COMMUNITY): Payer: Self-pay | Admitting: Physical Therapy

## 2017-07-07 ENCOUNTER — Inpatient Hospital Stay (HOSPITAL_COMMUNITY): Payer: Self-pay | Admitting: Speech Pathology

## 2017-07-07 ENCOUNTER — Inpatient Hospital Stay (HOSPITAL_COMMUNITY): Payer: Self-pay | Admitting: Occupational Therapy

## 2017-07-07 LAB — PROTIME-INR
INR: 1.36
PROTHROMBIN TIME: 16.6 s — AB (ref 11.4–15.2)

## 2017-07-07 MED ORDER — WARFARIN SODIUM 7.5 MG PO TABS
7.5000 mg | ORAL_TABLET | Freq: Once | ORAL | Status: AC
  Administered 2017-07-07: 7.5 mg via ORAL
  Filled 2017-07-07: qty 1

## 2017-07-07 MED ORDER — WARFARIN SODIUM 5 MG PO TABS: 5.0000 mg | ORAL_TABLET | Freq: Every day | ORAL | 2 refills | Status: DC

## 2017-07-07 MED ORDER — JEVITY 1.2 CAL PO LIQD
ORAL | Status: AC
  Filled 2017-07-07: qty 237

## 2017-07-07 NOTE — Progress Notes (Signed)
ANTICOAGULATION CONSULT NOTE - Initial Consult  Pharmacy Consult for warfarin Indication: atrial fibrillation  No Known Allergies  Patient Measurements: Height: 5\' 11"  (180.3 cm) Weight: 158 lb 4.6 oz (71.8 kg) IBW/kg (Calculated) : 75.3 Heparin Dosing Weight:   Vital Signs: Temp: 97.8 F (36.6 C) (03/08 0134) Temp Source: Oral (03/08 0134) BP: 142/64 (03/08 0134) Pulse Rate: 87 (03/08 0134)  Labs: Recent Labs    07/06/17 0544 07/07/17 0550  LABPROT 16.2* 16.6*  INR 1.31 1.36    Medical History: Past Medical History:  Diagnosis Date  . COPD (chronic obstructive pulmonary disease) (HCC)   . Emphysema lung (HCC)   . Hx of long term use of blood thinners   . Hypertension   . Stroke [facility](HCC) 2013  . Thyroid disease    hypothyroidism    Assessment: 77 yo male on warfarin prior to admission for AFib, but was admitted with subtherapeutic INR; unclear if this was due to poor compliance or inappropriate dose. Regardless, the patient was admitted with an ischemic stroke, he received tPA and unfortunately had a hemorrhagic transformation. Now a couple weeks out, and restarted coumadin on 3/6. INR 1.36 today, plan for discharge Trending up slowly. Noted off fluconazole (2/28 >> 3/5) now.   PTA warfarin dose: 5 mg po daily  Goal of Therapy:  INR 2-3 Monitor platelets by anticoagulation protocol: Yes   Plan:  -Warfarin 7.5 mg po x1 tonight if stays here -Other wise ok to continue home dose after discharge -Daily INR  Bayard HuggerMei Kendrell Lottman, PharmD, BCPS  Clinical Pharmacist  Pager: [phone]   07/07/2017,1:11 PM

## 2017-07-07 NOTE — Discharge Instructions (Signed)
Inpatient Rehab Discharge Instructions  Marinus MawOtis L Khanna Discharge date and time: No discharge date for patient encounter.   Activities/Precautions/ Functional Status: Activity: activity as tolerated Diet: Jevity tube feeds 300 mL as directed 4 times daily with free water 200 mL 4 times daily by PEG tube Wound Care: Keep PEG tube site clean and dry Functional status:  ___ No restrictions     ___ Walk up steps independently ___ 24/7 supervision/assistance   ___ Walk up steps with assistance ___ Intermittent supervision/assistance  ___ Bathe/dress independently ___ Walk with walker     _x__ Bathe/dress with assistance ___ Walk Independently    ___ Shower independently ___ Walk with assistance    ___ Shower with assistance ___ No alcohol     ___ Return to work/school ________  Special Instructions: No smoking  Home health nurse to check INR on 07/12/2017   results to Dr. Harl BowieMassoud [phone] fax number [phone]  COMMUNITY REFERRALS UPON DISCHARGE:    Home Health:   PT, OT,SP, RN     Agency:[facility]   [phone]    Date of last service:07/08/2017  Outpatient: PT, OT, SP  Agency:[facility]  Phone:[phone]   Appointment Date/Time:WILL CALL DAUGHTER TO SET UP APPOINTMENTS TRANSITION TO OP THERAPIES.  Medical Equipment/Items Ordered:JEVITY, PROMOD, BEDSIDE COMMODE, TUB BENCH AND Sara ChuYONKERS SUCTION MACHINE  Agency/Supplier:[facility]   [phone]   GENERAL COMMUNITY RESOURCES FOR PATIENT/FAMILY: Support Groups:[facility] SUPPORT GROUP EVERY SECOND Thursday @ 3:00-4:00 PM ON THE REHAB UNIT QUESTIONS CONTACT [place] [phone]  My questions have been answered and I understand these instructions. I will adhere to these goals and the provided educational materials after my discharge from the hospital.  Patient/Caregiver Signature _______________________________ Date __________  Clinician Signature _______________________________________ Date  __________  Please bring this form and your medication list with you to all your follow-up doctor's appointments.

## 2017-07-07 NOTE — Progress Notes (Signed)
Subjective/Complaints: No new complaints. Anxious to get home!!  ROS: limited due to language/communication   Objective: Vital Signs: Blood pressure (!) 142/64, pulse 87, temperature 97.8 F (36.6 C), temperature source Oral, resp. rate 18, height 5\' 11"  (1.803 m), weight 71.8 kg (158 lb 4.6 oz), SpO2 100 %. No results found. Results for orders placed or performed during the hospital encounter of 06/23/17 (from the past 72 hour(s))  Protime-INR     Status: Abnormal   Collection Time: 07/06/17  5:44 AM  Result Value Ref Range   Prothrombin Time 16.2 (H) 11.4 - 15.2 seconds   INR 1.31     Comment: Performed at [facility], [address] [place], [place] [address]  Protime-INR     Status: Abnormal   Collection Time: 07/07/17  5:50 AM  Result Value Ref Range   Prothrombin Time 16.6 (H) 11.4 - 15.2 seconds   INR 1.36     Comment: Performed at [facility], [address] [place], [place] [address]     HEENT: Normocephalic, atraumatic.Small to moderate Whitish plaque on tongue, no plaques in the pharyngeal area, no plaques in buccal area Cardio: Irregularly irregular without JVD or murmur  . Resp: CTA Bilaterally without wheezes or rales. Normal effort  GI: Bowel sounds are positive but patient slightly tender near PEG site.  Site itself is clean and intact. Skin:   Scattered bruises Neuro: Limited due to severe dysarthria and aphasia Motor:  4/5 on Left sided 5/5 on right side--exam stable Musc/Skel:  No edema. No tenderness.  No pain with PROM right shoulder Gen NAD. Vital signs reviewed Psych: Pleasant and appropriate  Assessment/Plan: 1. 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 Physiatrist is  providing close team supervision and 24 hour management of active medical problems listed below. Physiatrist and rehab team continue to assess barriers to discharge/monitor patient progress toward functional and medical goals. FIM: Function - Bathing Position: Shower(Shower chair in shower) Body parts bathed by patient: Right arm, Left arm, Chest, Abdomen, Front perineal area, Buttocks, Right upper leg, Left upper leg, Right lower leg, Left lower leg Body parts bathed by helper: Back Assist Level: Supervision or verbal cues  Function- Upper Body Dressing/Undressing What is the patient wearing?: Pull over shirt/dress Pull over shirt/dress - Perfomed by patient: Thread/unthread right sleeve, Thread/unthread left sleeve, Put head through opening Pull over shirt/dress - Perfomed by helper: Thread/unthread left sleeve Button up shirt - Perfomed by patient: Thread/unthread right sleeve Button up shirt - Perfomed by helper: Thread/unthread left sleeve, Pull shirt around back Assist Level: Supervision or verbal cues Function - Lower Body Dressing/Undressing What is the patient wearing?: Non-skid slipper socks, Pants Position: Other (comment)(BSC in bathroom) Underwear - Performed by patient: Thread/unthread right underwear leg, Thread/unthread left underwear leg, Pull underwear up/down Underwear - Performed by helper: Pull underwear up/down Pants- Performed by patient: Thread/unthread right pants leg, Thread/unthread left pants leg, Pull pants up/down Pants- Performed by helper: Pull pants up/down Non-skid slipper socks- Performed by patient: Don/doff right sock, Don/doff left sock Socks - Performed by patient: Don/doff right sock, Don/doff left sock Socks - Performed by helper: Don/doff right sock, Don/doff left sock Shoes - Performed by patient: Don/doff right shoe, Don/doff left shoe Assist for footwear: Supervision/touching assist Assist  for lower body dressing: Supervision or verbal  cues  Function - Toileting Toileting steps completed by patient: Adjust clothing prior to toileting, Performs perineal hygiene, Adjust clothing after toileting Toileting steps completed by helper: Performs perineal hygiene, Adjust clothing after toileting Toileting Assistive Devices: Grab bar or rail Assist level: Touching or steadying assistance (Pt.75%)  Function - Toilet Transfers Toilet transfer assistive device: Walker, Grab bar Assist level to toilet: Touching or steadying assistance (Pt > 75%) Assist level from toilet: Touching or steadying assistance (Pt > 75%)  Function - Chair/bed transfer Chair/bed transfer method: Ambulatory Chair/bed transfer assist level: Supervision or verbal cues Chair/bed transfer assistive device: Bedrails, Armrests Chair/bed transfer details: Verbal cues for precautions/safety, Verbal cues for technique, Verbal cues for sequencing  Function - Locomotion: Wheelchair Will patient use wheelchair at discharge?: No Function - Locomotion: Ambulation Assistive device: No device Max distance: 223ft  Assist level: Supervision or verbal cues Assist level: Supervision or verbal cues Assist level: Supervision or verbal cues Assist level: Supervision or verbal cues Walk 10 feet on uneven surfaces activity did not occur: Safety/medical concerns Assist level: Touching or steadying assistance (Pt > 75%)  Function - Comprehension Comprehension: Auditory Comprehension assist level: Understands basic 50 - 74% of the time/ requires cueing 25 - 49% of the time  Function - Expression Expression: Verbal Expression assist level: Expresses basis less than 25% of the time/requires cueing >75% of the time.  Function - Social Interaction Social Interaction assist level: Interacts appropriately less than 25% of the time. May be withdrawn or combative.  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  2. DVT Prophylaxis/Anticoagulation: SCD's only given hemorrhage.Monitor for signs of DVT -Dopplers negative 3. Pain Management:Tylenol as needed   Voltaren gel ordered for right shoulder 4. Mood:Provide emotional support 5. Neuropsych: This patientiscapable of making decisions on hisown behalf. 6. Skin/Wound Care:Routine skin checks 7. Fluids/Electrolytes/Nutrition:Routine I&Os   BMP within acceptable range on 2/25 8. Dysphagia.NPO. Cortrakfor nutritional support.  Repeat modified barium swallow, no improvement with swallowing.   -PEG in place and functioning.  Patient tolerating fairly well so far  10.Hypothyroidism. Synthroid 11.Hyperlipidemia.Lipitor 12.Atrial fibrillation. Cardiac rate controlled. Chronic Coumadin on hold d/t hemorrhage.  Discontinue ASA 81mg ,, may resume warfarin 13.Tobacco abuse. Counseling 14.  Hypoalbuminemia : pro-stat 15. HTN    Vitals:   07/06/17 1438 07/07/17 0134  BP: 126/80 (!) 142/64  Pulse: (!) 103 87  Resp: 18 18  Temp: 97.7 F (36.5 C) 97.8 F (36.6 C)  SpO2: 98% 100%     Lisinopril 5 started on 2/27,  , increased to 20mg   3/6--with some improvement thus far.  Continue to observe 16.  Poor oral hygiene  secondary to n.p.o. status and  patient's inability to perform oral hygiene.Mouth and pharynx looking much better. Continue aggressive oral care.  LOS (Days) 14 A FACE TO FACE EVALUATION WAS PERFORMED  SWARTZ,ZACHARY T 07/07/2017, 10:23 AM

## 2017-07-07 NOTE — Progress Notes (Signed)
Occupational Therapy Session Note  Patient Details  Name: Ronald [name] MRN: [id number] Date of Birth: 1941-01-07  Today's Date: 07/07/2017 OT Individual Time: [id number] OT Individual Time Calculation (min): 60 min    Short Term Goals: Week 2:  OT Short Term Goal 1 (Week 2): STG=LTG due to LOS  Skilled Therapeutic Interventions/Progress Updates:  Pt reports no pain during therapy session. He agreed to ADL bathing/dressing, he was able to scan through and gather clean clothing from closet, [facility]'s for clothing orientation. Pt ambulated and required supervision with VC's throughout ADL therapy session for safety awareness and precaustions. Pt ambulated to rehab gym to participate in fine motor bolt activity, he was able to remove eight bolts and washers of differing sizes and located on different heights to improve ROM, three point pinch, in hand manipulation, reach, endurance, bilateral coordination in BUE as well as planning, and problem solving, he required Min VC's throughout activity due to L side inattention. Pt ambulated back to room was left supine in bed with bed alarm and call bell in reach.     Therapy Documentation Precautions:  Precautions Precautions: Fall Precaution Comments: PEG tube, left neglect/visual field deficit, global aphasia Restrictions Weight Bearing Restrictions: No       Other Treatments:    See Function Navigator for Current Functional Status.   Therapy/Group: Individual Therapy  Baruch GoutyMary Mak Bonny 07/07/2017, 9:53 AM

## 2017-07-07 NOTE — Progress Notes (Signed)
Occupational Therapy Discharge Summary  Patient Details  Name: Ronald [name] MRN: [id number] Date of Birth: June 06, 1940   Patient has met 11 of 11 long term goals due to improved activity tolerance, improved balance, postural control, ability to compensate for deficits, functional use of  LEFT upper extremity, improved attention, improved awareness and improved coordination.  Patient to discharge at overall Supervision level.  Patient's care partner is independent to provide the necessary physical and cognitive assistance at discharge.  Education and hands on family education completed with pt's daughter who he plans to d/c home with. Extensive education provided regarding pt's need for strict 24 hr supervision due to severe expressive aphasia, severe L neglect/ inattention, and poor dynamic balance. She voiced understanding of recommendations and reports she is taking 2 weeks FMLA from work. Is aware pt will likely required 24hr supervision beyond this time frame. Recommending using of tub transfer bench for bathing tasks at d/c. Family education completed regarding recommendations for use of tub bench, technique for transfer, and set-up of DME.    Recommendation:  Patient will benefit from ongoing skilled OT services in outpatient setting to continue to advance functional skills in the area of BADL and Reduce care partner burden.  Equipment: BSC and tub transfer bench  Reasons for discharge: treatment goals met and discharge from hospital  Patient/family agrees with progress made and goals achieved: Yes  OT Discharge Precautions/Restrictions  Precautions Precautions: Fall Precaution Comments: PEG tube, left neglect/visual field deficit, global aphasia Restrictions Weight Bearing Restrictions: No Vision Baseline Vision/History: Wears glasses Wears Glasses: Reading only Patient Visual Report: Other (comment)(Unable to formally assess due to global aphasia, but from observation, visual  deficits are observed) Vision Assessment?: Vision impaired- to be further tested in functional context Additional Comments: L inattention and/or field cut, however, has demonstrated improved compensatory techniques during functional tasks to locate items on L Perception  Perception: Impaired Inattention/Neglect: Does not attend to left visual field Praxis Praxis: Impaired Praxis Impairment Details: Motor planning Cognition Overall Cognitive Status: Difficult to assess(Difficult to assess due to global aphasia and severe dysarthria) Arousal/Alertness: Awake/alert Orientation Level: Oriented to person(Unable to communicate basic orientation other than name due to global phasia and severe dysarthria, however, knows he is going home today and can state his name) Sensation Sensation Proprioception: Impaired by gross assessment Additional Comments: Unable to formally assess due to global aphasia Coordination Gross Motor Movements are Fluid and Coordinated: No Fine Motor Movements are Fluid and Coordinated: Yes Coordination and Movement Description: Mild L hemiplegia, able to use L UE in functional task mod I as non-dominant level Motor  Motor Motor: Hemiplegia Motor - Discharge Observations: Minimal L hemiplegia Trunk/Postural Assessment  Cervical Assessment Cervical Assessment: Exceptions to WFL(Forward head) Thoracic Assessment Thoracic Assessment: Exceptions to WFL(Rounded shoulders) Lumbar Assessment Lumbar Assessment: Exceptions to WFL(posterior pelvic tilt) Postural Control Postural Control: Deficits on evaluation  Balance Balance Balance Assessed: Yes Static Sitting Balance Static Sitting - Balance Support: Feet supported;No upper extremity supported Static Sitting - Level of Assistance: 5: Stand by assistance Dynamic Sitting Balance Dynamic Sitting - Balance Support: During functional activity;No upper extremity supported Dynamic Sitting - Level of Assistance: 5: Stand by  assistance Static Standing Balance Static Standing - Balance Support: During functional activity;No upper extremity supported Static Standing - Level of Assistance: 5: Stand by assistance Static Standing - Comment/# of Minutes: Standing during LB dressing and toileting tasks Dynamic Standing Balance Dynamic Standing - Balance Support: During functional activity Dynamic Standing - Level of Assistance:  5: Stand by assistance Dynamic Standing - Comments: Standing during LB dressing and toileting tasks Extremity/Trunk Assessment RUE Assessment RUE Assessment: Within Functional Limits(AROM and strength WFL during self-care tasks) LUE Assessment LUE Assessment: Exceptions to WFL(Pt able to use at non-dominant level during self care tasks, however, with decreased coordination and motor planning deficits noted)   See Function Navigator for Current Functional Status.  [place] Paiz L 07/07/2017, 7:29 AM

## 2017-07-07 NOTE — Progress Notes (Signed)
Physical Therapy Discharge Summary  Patient Details  Name: Ronald [name] MRN: [id number] Date of Birth: 1940-06-22  Today's Date: 07/07/2017 PT Individual Time: 1105-1205 PT Individual Time Calculation (min): 60 min    Patient has met 9 of 9 long term goals due to improved activity tolerance, improved balance, improved postural control, increased strength, ability to compensate for deficits, functional use of  left upper extremity and left lower extremity, improved attention, improved awareness and improved coordination.  Patient to discharge at an ambulatory level Supervision.   Patient's care partner is independent to provide the necessary physical and cognitive assistance at discharge.  Reasons goals not met: All PT goals met.   Recommendation:  Patient will benefit from ongoing skilled PT services in home health setting to continue to advance safe functional mobility, address ongoing impairments in safety, balance, gait, vision, and minimize fall risk.  Equipment: No equipment provided  Reasons for discharge: treatment goals met and discharge from hospital  Patient/family agrees with progress made and goals achieved: Yes   PT Treatment: PT instructed pt in Grad day assessment to measure progress toward goals. See below for details. Nustep reciprocal training x 8 minutes with min cues for speed to prevent fatigue. Dynavision visual tracking task. Pt required min cues for improved visual scanning to find targets in Upper and Lower L quadrants.  Pt returned to room and performed ambulatory transfer to bed with supervision assist. Sit>supine completed without and pt left supine in bed with call bell in reach and all needs met.     PT Discharge   Pain Pain Assessment Pain Assessment: No/denies pain Pain Score: 0-No pain Faces Pain Scale: Hurts little more Pain Type: Acute pain Pain Location: Shoulder Pain Orientation: Right Pain Descriptors / Indicators: Aching Pain Onset:  On-going Pain Intervention(s): Medication (See eMAR);Massage;Heat applied Vision/Perception  Vision - Assessment Alignment/Gaze Preference: Gaze right(mild deviation) Perception Perception: Impaired Inattention/Neglect: Does not attend to left visual field;Does not attend to left side of body Praxis Praxis: Impaired  Cognition Oriented to person and Place.    decreased safety awareness, Impaird problem solving, decreased short term memory. . Sensation Sensation Light Touch: Impaired by gross assessment Proprioception: Impaired by gross assessment Coordination Gross Motor Movements are Fluid and Coordinated: No Coordination and Movement Description: mild dysmetria in the LUE and LLE with formal testing  Motor  Motor Motor: Hemiplegia;Other (comment) Motor - Discharge Observations: generalized weakness   Mobility Bed Mobility Bed Mobility: Rolling Right;Rolling Left;Sit to Supine;Supine to Sit Rolling Right: 6: Modified independent (Device/Increase time) Rolling Left: 6: Modified independent (Device/Increase time) Supine to Sit: 6: Modified independent (Device/Increase time) Sit to Supine: 6: Modified independent (Device/Increase time) Transfers Transfers: Yes Sit to Stand: 6: Modified independent (Device/Increase time) Stand to Sit: 6: Modified independent (Device/Increase time) Stand Pivot Transfers: 5: Supervision Stand Pivot Transfer Details: Verbal cues for gait pattern;Verbal cues for precautions/safety;Verbal cues for technique Locomotion  Ambulation Ambulation: Yes Ambulation/Gait Assistance: 5: Supervision Ambulation Distance (Feet): 150 Feet Assistive device: None Ambulation/Gait Assistance Details: Verbal cues for technique;Verbal cues for precautions/safety Gait Gait: Yes Gait Pattern: Step-through pattern;Wide base of support Stairs / Additional Locomotion Stairs: Yes Stairs Assistance: 5: Supervision Stair Management Technique: One rail Right Number of  Stairs: 12 Height of Stairs: 6 Wheelchair Mobility Wheelchair Mobility: No  Trunk/Postural Assessment  Cervical Assessment Cervical Assessment: Exceptions to WFL(Forward head) Thoracic Assessment Thoracic Assessment: Exceptions to WFL(Rounded shoulders) Lumbar Assessment Lumbar Assessment: Exceptions to WFL(posterior pelvic tilt) Postural Control Postural Control: Within Functional Limits  Righting Reactions: delayed with LOB to the L  Balance Balance Balance Assessed: Yes Static Sitting Balance Static Sitting - Balance Support: Feet supported;No upper extremity supported Static Sitting - Level of Assistance: 6: Modified independent (Device/Increase time) Dynamic Sitting Balance Dynamic Sitting - Balance Support: During functional activity;No upper extremity supported Dynamic Sitting - Level of Assistance: 6: Modified independent (Device/Increase time) Static Standing Balance Static Standing - Balance Support: During functional activity;No upper extremity supported Static Standing - Level of Assistance: 6: Modified independent (Device/Increase time) Static Standing - Comment/# of Minutes: Standing during LB dressing and toileting tasks Dynamic Standing Balance Dynamic Standing - Balance Support: During functional activity Dynamic Standing - Level of Assistance: 5: Stand by assistance Dynamic Standing - Comments: Standing during LB dressing and toileting tasks Extremity Assessment  RUE Assessment RUE Assessment: Within Functional Limits(AROM and strength WFL during self-care tasks) LUE Assessment LUE Assessment: Exceptions to WFL(Pt able to use at non-dominant level during self care tasks, however, with decreased coordination and motor planning deficits noted) RLE Assessment RLE Assessment: Within Functional Limits RLE Strength RLE Overall Strength Comments: grossly 4+/5 proximal to distal LLE Assessment LLE Assessment: Within Functional Limits LLE Strength LLE Overall  Strength Comments: grossly 4+/5 proximal to distal   See Function Navigator for Current Functional Status.  Ronald [name] 07/07/2017, 12:01 PM

## 2017-07-07 NOTE — Progress Notes (Signed)
Social Work  Discharge Note  The overall goal for the admission was met for: DC Sat 3/9  Discharge location: Yes-HOME WITH DAUGHTER HOW CAN PROVIDE 24 HR SUPERVISION ON FMLA  Length of Stay: Yes-15 DAYS  Discharge activity level: Yes-SUPERVISION LEVEL  Home/community participation: Yes  Services provided included: MD, RD, PT, OT, SLP, RN, CM, TR, Pharmacy and SW  Financial Services: Medicare  Follow-up services arranged: Home Health: [facility]-PT,OT,RN,SP, Outpatient: [facility]-PT,OT,SP-CALL DAUGHTER TO SET UP APPOINTMENTS, DME: [facility]-3IN1, TUB BENCH, JEVITY, PROMOD AND Opal and Patient/Family request agency HH: PREF [facility], DME: NO PREF  Comments (or additional information):DAUGHTER WAS HERE FOR ALL DAY OF THERAPIES 3/7 AND FEELS COMFORTABLE WITH HIS CARE NEEDS. SHE IS A RN OVER A REHAB AT [facility].   Patient/Family verbalized understanding of follow-up arrangements: Yes  Individual responsible for coordination of the follow-up plan: DEBBIE-DAUGHTER  Confirmed correct DME delivered: Elease Hashimoto 07/07/2017    Elease Hashimoto

## 2017-07-07 NOTE — Discharge Summary (Signed)
NAME:  Laforte, Jondavid                    ACCOUNT NO.:  [id number]  MEDICAL RECORD NO.:  [id number]  LOCATION:                                 FACILITY:  PHYSICIAN:  [name], M.D.DATE OF BIRTH:  10/25/1940  DATE OF ADMISSION:  06/23/2017 DATE OF DISCHARGE:  07/04/2017                              DISCHARGE SUMMARY   DISCHARGE DIAGNOSES: 1. Right temporoparietal infarction, status post tPA with hemorrhagic     conversion. 2. Deep venous thrombosis prophylaxis, on Coumadin and pain     management. 3. Dysphagia status post PEG tube on July 04, 2017. 4. Hypothyroidism. 5. Hyperlipidemia. 6. Atrial fibrillation. 7. Tobacco abuse. 8. Hypertension.  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  He lives with male companion reportedly mobile and independent.  Plans to stay with daughter on discharge.  Cranial CT scan negative for acute changes, old left posterior frontal infarction.  The patient did receive tPA.  INR on admission of 1.23.  CT angiogram of head and neck showed apparent occlusion of right insular anterior temporal M2 branch.  CT cerebral perfusion scan showed acute changes in the right MCA territory compatible with infarction.  Echocardiogram with ejection fraction 60%.  No wall motion abnormalities.  Carotid arteriogram on June 19, 2017, showed no acute occlusions, AV shunting, or aneurysm.  The patient did require short-term intubation. MRI showed a 3.2 cm acute hemorrhage within the right lateral temporal lobe.  CT of the head showed evolving multifocal right MCA territory infarct with evolving focal hemorrhagic conversion in the right temporal lobe.  Aspirin for CVA prophylaxis was initially on hold as well  as Coumadin.  Cortrak tube placed for nutritional support.  The patient was admitted for comprehensive rehab program.  PAST MEDICAL HISTORY:  See discharge diagnoses.  SOCIAL HISTORY:  Lives with male companion, independent prior to admission.  FUNCTIONAL STATUS:  Upon admission to Rehab Services was minimal assist 6 feet, 2-person handheld assistance, minimal assist supine-to-sit, max total assist activities of daily living.  PHYSICAL EXAMINATION:  VITAL SIGNS:  Blood pressure 127/62, pulse 93, temperature 97, respirations 18. GENERAL:  This is an alert male, in no acute distress. HEENT:  Nasogastric tube in place.  Pupils round and reactive to light. NECK:  Supple.  Nontender.  No JVD. CARDIAC:  Rate irregularly irregular. ABDOMEN:  Soft, nontender.  Good bowel sounds. LUNGS:  Limited inspiratory effort with some upper airway congestion. He had a right gaze preference.  Able to protrude his tongue forward, but unable to move past his lips.  REHABILITATION HOSPITAL COURSE:  The patient was admitted to Inpatient Rehab Services.  Therapies initiated on a 3-hour daily basis, consisting of physical therapy, occupational therapy, speech therapy, and rehabilitation nursing.  The following issues were addressed during the patient's rehabilitation stay.  Pertaining to Mr. Moyo' right temporoparietal infarction, initially he had received tPA.  Coumadin held and later resumed.  No bleeding episodes.  He would follow  up Dr. Juel Burrow for management of Coumadin as well as Neurology Services after infarction.  Pain management, use of Tylenol as needed.  Blood pressures controlled.  Noted dysphagia.  Nasogastric tube in place and later with placement of gastrostomy tube for nutritional support on July 04, 2017, per Interventional Radiology.  He remained on hormone supplement for hypothyroidism.  Cardiac, rate controlled.  Long history of tobacco abuse, receiving full counseling in regard to  cessation of nicotine products.  Blood pressures monitored while on lisinopril.  The patient received weekly collaborative interdisciplinary team conferences to discuss estimated length of stay, family teaching, any barriers to his discharge.  The patient ambulating up to 200 feet supervision, working with dynamic standing, balance, simulated home environments.  He could navigate around obstacles focusing on the right side.  Activities of daily living and homemaking.  Ambulates in his room to gather clothing items from the closet with close supervision.  He could dress seated at the edge of the bed.  Full family teaching ongoing with daughter, who he would be discharged to home with on 24-hour supervision.  Full family teaching was completed and discharged to home.  DISCHARGE MEDICATIONS: 1. Lipitor 40 mg daily by tube. 2. Voltaren gel 4 times daily to affected areas. 3. Synthroid 125 mcg daily by tube. 4. Lisinopril 20 mg daily by tube. 5. Nicoderm patch taper as directed. 6. Protonix 40 mg daily by tube. 7. Coumadin latest dose of 5 mg adjusted accordingly for INR of 2.0 to     3.0. 8. Jevity tube feeds 300 mL four times daily by tube. 9. Free water 200 mL four times daily by tube.  FOLLOWUP:  The patient would follow up with Dr. Claudette Laws, [facility] as directed; Dr. Delia Heady, Neurology Service, call for appointment; Dr. Fredia Sorrow, Interventional Radiology as needed; Dr. Corky Downs, Medical Management.  A home health nurse has been arranged to check INR on July 12, 2017, results to Dr. Juel Burrow, [phone], fax #[phone].     Mariam Dollar, P.A.   ______________________________ [name], M.D.    DA/MEDQ  D:  07/06/2017  T:  07/07/2017  Job:  [id number]  cc:   Pramod P. Pearlean Brownie, MD Corky Downs, MD [name], M.D. Jodi Marble. Fredia Sorrow, M.D.

## 2017-07-07 NOTE — Progress Notes (Addendum)
Speech Language Pathology Discharge Summary  Patient Details  Name: Ronald [name] MRN: [id number] Date of Birth: 08-Oct-1940  Today's Date: 07/07/2017 SLP Individual Time: 1400-1500 SLP Individual Time Calculation (min): 60 min   Skilled Therapeutic Interventions:  Skilled treatment session focused on dysphagia and communication goals. SLP facilitated session by providing Total A oral stim to facilitate swallow initiation. Within 30 minute period of time, pt able to initiate 2 swallows. Pt continues with audible pharyngeal secretions that he is not able to clear d/t ineffective coughing. Pt able to state that 2 word phrase with mild increase in intelligibility but his speech continues to be largely unintelligible. Pt is excited about going home but we continue to recommend that pt discharge to skilled nursing facilitate d/t nutritional status, severity of swallow deficits, severity of cognitive deficits and virtually not ability to verbally communicate.     Patient has met 6 of 6 long term goals.  Patient to discharge at overall Max level.   Clinical Impression/Discharge Summary:   Pt is discharging at a Total to Max A level of assistance. Pt continues with severe deficits in swallow initiation and would recommend follow up MBS before PO trials. Pt with baseline expressive aphasia (word level communication prior to this admission) and now his ability to communicate verbally is further impacted by severe dysarthria (suspect he was dysarthric prior to this admission as well). Pt doesn't indicate understanding of pictures or pictures that represent any actions therefore his prognosis for using a picture AAC device is poor. Recommend follow up Outpatient ST to target dysphagia.  Care Partner:  Caregiver Able to Provide Assistance: Yes  Type of Caregiver Assistance: Cognitive;Physical  Recommendation:  Skilled Nursing facility  Rationale for SLP Follow Up: Maximize swallowing safety   Equipment:      Reasons for discharge: Discharged from hospital   Patient/Family Agrees with Progress Made and Goals Achieved: Yes   Function:    Cognition Comprehension Comprehension assist level: Understands basic 50 - 74% of the time/ requires cueing 25 - 49% of the time;Understands basic 25 - 49% of the time/ requires cueing 50 - 75% of the time  Expression   Expression assist level: Expresses basis less than 25% of the time/requires cueing >75% of the time.  Social Interaction Social Interaction assist level: Interacts appropriately less than 25% of the time. May be withdrawn or combative.  Problem Solving Problem solving assist level: Solves basic 25 - 49% of the time - needs direction more than half the time to initiate, plan or complete simple activities;Solves basic less than 25% of the time - needs direction nearly all the time or does not effectively solve problems and may need a restraint for safety  Memory Memory assist level: Recognizes or recalls less than 25% of the time/requires cueing greater than 75% of the time   Nathanael Krist 07/07/2017, 2:06 PM

## 2017-07-08 DIAGNOSIS — Z931 Gastrostomy status: Secondary | ICD-10-CM

## 2017-07-08 LAB — PROTIME-INR
INR: 1.4
Prothrombin Time: 17 seconds — ABNORMAL HIGH (ref 11.4–15.2)

## 2017-07-08 NOTE — Progress Notes (Signed)
Subjective/Complaints: Patient seen lying in bed this morning. He states he slept well overnight. He indicates that he is ready to go home today.  ROS: Limited due to language/communication   Objective: Vital Signs: Blood pressure (!) 149/70, pulse 98, temperature 98.1 F (36.7 C), temperature source Oral, resp. rate 18, height 5\' 11"  (1.803 m), weight 66.8 kg (147 lb 4.3 oz), SpO2 97 %. No results found. Results for orders placed or performed during the hospital encounter of 06/23/17 (from the past 72 hour(s))  Protime-INR     Status: Abnormal   Collection Time: 07/06/17  5:44 AM  Result Value Ref Range   Prothrombin Time 16.2 (H) 11.4 - 15.2 seconds   INR 1.31     Comment: Performed at [facility], [address], [place], [place] [address]  Protime-INR     Status: Abnormal   Collection Time: 07/07/17  5:50 AM  Result Value Ref Range   Prothrombin Time 16.6 (H) 11.4 - 15.2 seconds   INR 1.36     Comment: Performed at [facility], [address], [place], [place] [address]  Protime-INR     Status: Abnormal   Collection Time: 07/08/17  5:32 AM  Result Value Ref Range   Prothrombin Time 17.0 (H) 11.4 - 15.2 seconds   INR 1.40     Comment: Performed at [facility], [address], [place], [place] [address]     HEENT: Normocephalic, atraumatic. Cardio: Irregularly irregular no JVD . Resp: CTA Bilaterally Normal effort  GI: Bowel sounds are positive. Nondistended. Skin:   Scattered bruises Neuro: Limited due to severe dysarthria and aphasia Motor:  4/5 on Left sided 5/5 on right side (unchanged) Musc/Skel:  No edema. No tenderness.  No pain with PROM right shoulder Gen NAD. Vital signs reviewed Psych: Pleasant and appropriate  Assessment/Plan: 1. Functional deficits secondary toLeft side weaknesssecondary to right temporoparietal infarct status post TPA with hemorrhagic conversion as well as history of left MCA infarction with residual aphasia   which  require 3+ hours per day of interdisciplinary therapy in a comprehensive inpatient rehab setting. Physiatrist is providing close team supervision and 24 hour management of active medical problems listed below. Physiatrist and rehab team continue to assess barriers to discharge/monitor patient progress toward functional and medical goals. FIM: Function - Bathing Position: Shower Body parts bathed by patient: Right arm, Left arm, Chest, Abdomen, Front perineal area, Buttocks, Right upper leg, Left upper leg, Right lower leg, Left lower leg Body parts bathed by helper: Back Assist Level: Supervision or verbal cues  Function- Upper Body Dressing/Undressing What is the patient wearing?: Pull over shirt/dress Pull over shirt/dress - Perfomed by patient: Thread/unthread right sleeve, Thread/unthread left sleeve, Put head through opening, Pull shirt over trunk Pull over shirt/dress - Perfomed by helper: Thread/unthread left sleeve Button up shirt - Perfomed by patient: Thread/unthread right sleeve Button up shirt - Perfomed by helper: Thread/unthread left sleeve, Pull shirt around back Assist Level: Supervision or verbal cues Function - Lower Body Dressing/Undressing What is the patient wearing?: Non-skid slipper socks, Pants, Underwear Position: Other (comment)(BSC in bathroom) Underwear - Performed by patient: Thread/unthread right underwear leg, Thread/unthread left underwear leg, Pull underwear up/down Underwear - Performed by helper: Pull underwear up/down Pants- Performed by patient: Thread/unthread right pants leg, Thread/unthread left pants leg, Pull pants up/down Pants- Performed by helper: Pull pants up/down Non-skid slipper socks- Performed by patient: Don/doff right sock, Don/doff left sock Socks - Performed by patient:  Don/doff right sock, Don/doff left sock Socks - Performed by helper: Don/doff right sock, Don/doff left sock Shoes - Performed by patient: Don/doff right shoe, Don/doff  left shoe Assist for footwear: Supervision/touching assist Assist for lower body dressing: Supervision or verbal cues  Function - Toileting Toileting steps completed by patient: Adjust clothing prior to toileting, Performs perineal hygiene, Adjust clothing after toileting Toileting steps completed by helper: Performs perineal hygiene, Adjust clothing after toileting Toileting Assistive Devices: Grab bar or rail Assist level: Supervision or verbal cues  Function - Toilet Transfers Toilet transfer assistive device: Grab bar Assist level to toilet: Supervision or verbal cues Assist level from toilet: Supervision or verbal cues  Function - Chair/bed transfer Chair/bed transfer method: Ambulatory Chair/bed transfer assist level: Supervision or verbal cues Chair/bed transfer assistive device: Bedrails, Armrests Chair/bed transfer details: Verbal cues for precautions/safety, Verbal cues for technique, Verbal cues for sequencing  Function - Locomotion: Wheelchair Will patient use wheelchair at discharge?: No Wheelchair activity did not occur: N/A Wheel 50 feet with 2 turns activity did not occur: N/A Wheel 150 feet activity did not occur: N/A Function - Locomotion: Ambulation Assistive device: No device Max distance: 16250ft  Assist level: Supervision or verbal cues Assist level: Supervision or verbal cues Assist level: Supervision or verbal cues Assist level: Supervision or verbal cues Walk 10 feet on uneven surfaces activity did not occur: Safety/medical concerns Assist level: Touching or steadying assistance (Pt > 75%)  Function - Comprehension Comprehension: Auditory Comprehension assist level: Understands basic 50 - 74% of the time/ requires cueing 25 - 49% of the time, Understands basic 25 - 49% of the time/ requires cueing 50 - 75% of the time  Function - Expression Expression: Verbal Expression assist level: Expresses basis less than 25% of the time/requires cueing >75% of the  time.  Function - Social Interaction Social Interaction assist level: Interacts appropriately less than 25% of the time. May be withdrawn or combative.  Function - Problem Solving Problem solving assist level: Solves basic 25 - 49% of the time - needs direction more than half the time to initiate, plan or complete simple activities, Solves basic less than 25% of the time - needs direction nearly all the time or does not effectively solve problems and may need a restraint for safety  Function - Memory Memory assist level: Recognizes or recalls less than 25% of the time/requires cueing greater than 75% of the time Patient normally able to recall (first 3 days only): That he or she is in a hospital  Medical Problem List and Plan: 1.Left side weaknesssecondary to right temporoparietal infarct status post TPA with hemorrhagic conversion as well as history of left MCA infarction with residual aphasia DC today   -Continue to finalize discharge planning for 07/08/2017    -Patient to see Rehab MD/provider in the office for transitional care encounter in 1-2 weeks.  2. DVT Prophylaxis/Anticoagulation: SCD's.Monitor for signs of DVT -Dopplers negative 3. Pain Management:Tylenol as needed   Voltaren gel ordered for right shoulder 4. Mood:Provide emotional support 5. Neuropsych: This patientiscapable of making decisions on hisown behalf. 6. Skin/Wound Care:Routine skin checks 7. Fluids/Electrolytes/Nutrition:Routine I&Os   BMP within acceptable range on 2/25 8. Dysphagia.NPO. Cortrakfor nutritional support.  Repeat modified barium swallow, no improvement with swallowing.   -PEG in place and functioning.  Patient tolerating fairly well so far 10.Hypothyroidism. Synthroid 11.Hyperlipidemia.Lipitor 12.Atrial fibrillation. Cardiac rate controlled. Chronic Coumadin on hold d/t hemorrhage.  Resumed warfarin 13.Tobacco abuse. Counseling 14.  Hypoalbuminemia :  pro-stat 15. HTN    Vitals:   07/07/17 1500 07/08/17 0148  BP: 124/60 (!) 149/70  Pulse: 97 98  Resp: 19 18  Temp: 97.6 F (36.4 C) 98.1 F (36.7 C)  SpO2: 99% 97%     Lisinopril 5 started on 2/27,  , increased to 20mg   3/6  Slightly labile, but appears to be improving on 3/9  Will require ambulatory monitoring 16.  Poor oral hygiene secondary to n.p.o. status and patient's inability to perform oral hygiene.Mouth and pharynx looking much better. Continue aggressive oral care.  LOS (Days) 15 A FACE TO FACE EVALUATION WAS PERFORMED  Upton Russey Karis Juba 07/08/2017, 9:29 AM

## 2017-07-09 DIAGNOSIS — Z431 Encounter for attention to gastrostomy: Secondary | ICD-10-CM | POA: Diagnosis not present

## 2017-07-09 DIAGNOSIS — I69320 Aphasia following cerebral infarction: Secondary | ICD-10-CM | POA: Diagnosis not present

## 2017-07-09 DIAGNOSIS — F17210 Nicotine dependence, cigarettes, uncomplicated: Secondary | ICD-10-CM | POA: Diagnosis not present

## 2017-07-09 DIAGNOSIS — I69391 Dysphagia following cerebral infarction: Secondary | ICD-10-CM | POA: Diagnosis not present

## 2017-07-09 DIAGNOSIS — I4891 Unspecified atrial fibrillation: Secondary | ICD-10-CM | POA: Diagnosis not present

## 2017-07-09 DIAGNOSIS — E785 Hyperlipidemia, unspecified: Secondary | ICD-10-CM | POA: Diagnosis not present

## 2017-07-09 DIAGNOSIS — R1310 Dysphagia, unspecified: Secondary | ICD-10-CM | POA: Diagnosis not present

## 2017-07-09 DIAGNOSIS — I69354 Hemiplegia and hemiparesis following cerebral infarction affecting left non-dominant side: Secondary | ICD-10-CM | POA: Diagnosis not present

## 2017-07-09 DIAGNOSIS — J439 Emphysema, unspecified: Secondary | ICD-10-CM | POA: Diagnosis not present

## 2017-07-09 DIAGNOSIS — E039 Hypothyroidism, unspecified: Secondary | ICD-10-CM | POA: Diagnosis not present

## 2017-07-09 DIAGNOSIS — I10 Essential (primary) hypertension: Secondary | ICD-10-CM | POA: Diagnosis not present

## 2017-07-09 DIAGNOSIS — Z7901 Long term (current) use of anticoagulants: Secondary | ICD-10-CM | POA: Diagnosis not present

## 2017-07-11 DIAGNOSIS — I69391 Dysphagia following cerebral infarction: Secondary | ICD-10-CM | POA: Diagnosis not present

## 2017-07-11 DIAGNOSIS — R1310 Dysphagia, unspecified: Secondary | ICD-10-CM | POA: Diagnosis not present

## 2017-07-11 DIAGNOSIS — I69354 Hemiplegia and hemiparesis following cerebral infarction affecting left non-dominant side: Secondary | ICD-10-CM | POA: Diagnosis not present

## 2017-07-11 DIAGNOSIS — J439 Emphysema, unspecified: Secondary | ICD-10-CM | POA: Diagnosis not present

## 2017-07-11 DIAGNOSIS — I69320 Aphasia following cerebral infarction: Secondary | ICD-10-CM | POA: Diagnosis not present

## 2017-07-11 DIAGNOSIS — Z431 Encounter for attention to gastrostomy: Secondary | ICD-10-CM | POA: Diagnosis not present

## 2017-07-12 ENCOUNTER — Telehealth: Payer: Self-pay | Admitting: Registered Nurse

## 2017-07-12 DIAGNOSIS — J439 Emphysema, unspecified: Secondary | ICD-10-CM | POA: Diagnosis not present

## 2017-07-12 DIAGNOSIS — I69320 Aphasia following cerebral infarction: Secondary | ICD-10-CM | POA: Diagnosis not present

## 2017-07-12 DIAGNOSIS — R1310 Dysphagia, unspecified: Secondary | ICD-10-CM | POA: Diagnosis not present

## 2017-07-12 DIAGNOSIS — I69354 Hemiplegia and hemiparesis following cerebral infarction affecting left non-dominant side: Secondary | ICD-10-CM | POA: Diagnosis not present

## 2017-07-12 DIAGNOSIS — Z431 Encounter for attention to gastrostomy: Secondary | ICD-10-CM | POA: Diagnosis not present

## 2017-07-12 DIAGNOSIS — I69391 Dysphagia following cerebral infarction: Secondary | ICD-10-CM | POA: Diagnosis not present

## 2017-07-12 NOTE — Telephone Encounter (Signed)
Transitional Care call Transitional Care Call Completed, Appointment Confirmed. Address Confirmed, New Patient Packet Mailed. Transitional Care Call Questions Answered by Daughter [name]  Patient name: [name] DOB: 06/17/2015 1. Are you/is patient experiencing any problems since coming home? No a. Are there any questions regarding any aspect of care? No  2. Are there any questions regarding medications administration/dosing? No a. Are meds being taken as prescribed? Yes, except for Protonix, she states PA instructed to purchase Pepcid. He's tolerating the Pepcid b. "Patient should review meds with caller to confirm"  Medication List Reviewed 3. Have there been any falls? No 4. Has Home Health been to the house and/or have they contacted you? Yes, [facility] a. If not, have you tried to contact them? NA b. Can we help you contact them? NA 5. Are bowels and bladder emptying properly? Yes,  Experiencing Diarrhea, home health nurse evaluating.  a. Are there any unexpected incontinence issues? No b. If applicable, is patient following bowel/bladder programs? NA 6. Any fevers, problems with breathing, unexpected pain? No 7. Are there any skin problems or new areas of breakdown? No  8. Has the patient/family member arranged specialty MD follow up (ie cardiology/neurology/renal/surgical/etc.)?  Yes a. Can we help arrange? NA 9. Does the patient need any other services or support that we can help arrange? NA 10. Are caregivers following through as expected in assisting the patient? Yes 11. Has the patient quit smoking, drinking alcohol, or using drugs as recommended? [name] reports [name] doesn't smoke ,drink alcohol or use illicit drug.  Appointment date/time 07/18/2017 arrival time 1:00 appointment time 1:20  With Jacalyn LefevreEunice Thomas ANP-C [id number] [facility] [address]

## 2017-07-12 NOTE — Telephone Encounter (Signed)
Placed a call to [name], no answer. Left message to return the call.

## 2017-07-13 DIAGNOSIS — I69354 Hemiplegia and hemiparesis following cerebral infarction affecting left non-dominant side: Secondary | ICD-10-CM | POA: Diagnosis not present

## 2017-07-13 DIAGNOSIS — I69391 Dysphagia following cerebral infarction: Secondary | ICD-10-CM | POA: Diagnosis not present

## 2017-07-13 DIAGNOSIS — Z431 Encounter for attention to gastrostomy: Secondary | ICD-10-CM | POA: Diagnosis not present

## 2017-07-13 DIAGNOSIS — I69320 Aphasia following cerebral infarction: Secondary | ICD-10-CM | POA: Diagnosis not present

## 2017-07-13 DIAGNOSIS — J439 Emphysema, unspecified: Secondary | ICD-10-CM | POA: Diagnosis not present

## 2017-07-13 DIAGNOSIS — R1310 Dysphagia, unspecified: Secondary | ICD-10-CM | POA: Diagnosis not present

## 2017-07-14 DIAGNOSIS — I69354 Hemiplegia and hemiparesis following cerebral infarction affecting left non-dominant side: Secondary | ICD-10-CM | POA: Diagnosis not present

## 2017-07-14 DIAGNOSIS — I69320 Aphasia following cerebral infarction: Secondary | ICD-10-CM | POA: Diagnosis not present

## 2017-07-14 DIAGNOSIS — I69391 Dysphagia following cerebral infarction: Secondary | ICD-10-CM | POA: Diagnosis not present

## 2017-07-14 DIAGNOSIS — R1310 Dysphagia, unspecified: Secondary | ICD-10-CM | POA: Diagnosis not present

## 2017-07-14 DIAGNOSIS — Z431 Encounter for attention to gastrostomy: Secondary | ICD-10-CM | POA: Diagnosis not present

## 2017-07-14 DIAGNOSIS — J439 Emphysema, unspecified: Secondary | ICD-10-CM | POA: Diagnosis not present

## 2017-07-18 ENCOUNTER — Encounter: Payer: Medicare Other | Admitting: Registered Nurse

## 2017-07-18 DIAGNOSIS — R634 Abnormal weight loss: Secondary | ICD-10-CM | POA: Diagnosis not present

## 2017-07-18 DIAGNOSIS — E039 Hypothyroidism, unspecified: Secondary | ICD-10-CM | POA: Diagnosis not present

## 2017-07-18 DIAGNOSIS — I4891 Unspecified atrial fibrillation: Secondary | ICD-10-CM | POA: Diagnosis not present

## 2017-07-18 DIAGNOSIS — E785 Hyperlipidemia, unspecified: Secondary | ICD-10-CM | POA: Diagnosis not present

## 2017-07-19 ENCOUNTER — Telehealth: Payer: Self-pay | Admitting: Physical Medicine & Rehabilitation

## 2017-07-19 DIAGNOSIS — I69391 Dysphagia following cerebral infarction: Secondary | ICD-10-CM | POA: Diagnosis not present

## 2017-07-19 DIAGNOSIS — I69320 Aphasia following cerebral infarction: Secondary | ICD-10-CM | POA: Diagnosis not present

## 2017-07-19 DIAGNOSIS — Z431 Encounter for attention to gastrostomy: Secondary | ICD-10-CM | POA: Diagnosis not present

## 2017-07-19 DIAGNOSIS — R1310 Dysphagia, unspecified: Secondary | ICD-10-CM | POA: Diagnosis not present

## 2017-07-19 DIAGNOSIS — J439 Emphysema, unspecified: Secondary | ICD-10-CM | POA: Diagnosis not present

## 2017-07-19 DIAGNOSIS — I69354 Hemiplegia and hemiparesis following cerebral infarction affecting left non-dominant side: Secondary | ICD-10-CM | POA: Diagnosis not present

## 2017-07-19 NOTE — Telephone Encounter (Signed)
Jerrye Nobleiffany Turner RN with [facility] went out to home and patient is on a G tube and the daughter would like to know if they could increase it from 4 to 5 a day, patient has lost 2 lbs.  Please call Tiffany at [phone].

## 2017-07-20 DIAGNOSIS — Z431 Encounter for attention to gastrostomy: Secondary | ICD-10-CM | POA: Diagnosis not present

## 2017-07-20 DIAGNOSIS — I69391 Dysphagia following cerebral infarction: Secondary | ICD-10-CM | POA: Diagnosis not present

## 2017-07-20 DIAGNOSIS — J439 Emphysema, unspecified: Secondary | ICD-10-CM | POA: Diagnosis not present

## 2017-07-20 DIAGNOSIS — I69354 Hemiplegia and hemiparesis following cerebral infarction affecting left non-dominant side: Secondary | ICD-10-CM | POA: Diagnosis not present

## 2017-07-20 DIAGNOSIS — R1310 Dysphagia, unspecified: Secondary | ICD-10-CM | POA: Diagnosis not present

## 2017-07-20 DIAGNOSIS — I69320 Aphasia following cerebral infarction: Secondary | ICD-10-CM | POA: Diagnosis not present

## 2017-07-20 NOTE — Telephone Encounter (Signed)
May increase TF bolus to 5x per day

## 2017-07-21 DIAGNOSIS — I69354 Hemiplegia and hemiparesis following cerebral infarction affecting left non-dominant side: Secondary | ICD-10-CM | POA: Diagnosis not present

## 2017-07-21 DIAGNOSIS — I69320 Aphasia following cerebral infarction: Secondary | ICD-10-CM | POA: Diagnosis not present

## 2017-07-21 DIAGNOSIS — I69391 Dysphagia following cerebral infarction: Secondary | ICD-10-CM | POA: Diagnosis not present

## 2017-07-21 DIAGNOSIS — J439 Emphysema, unspecified: Secondary | ICD-10-CM | POA: Diagnosis not present

## 2017-07-21 DIAGNOSIS — Z431 Encounter for attention to gastrostomy: Secondary | ICD-10-CM | POA: Diagnosis not present

## 2017-07-21 DIAGNOSIS — R1310 Dysphagia, unspecified: Secondary | ICD-10-CM | POA: Diagnosis not present

## 2017-07-21 NOTE — Telephone Encounter (Signed)
Called her back and informed her of the approved change.

## 2017-07-22 ENCOUNTER — Ambulatory Visit
Admission: EM | Admit: 2017-07-22 | Discharge: 2017-07-22 | Disposition: A | Discharge status: Home / Self Care | Payer: Medicare Other | Source: Other Acute Inpatient Hospital | Attending: Cardiology | Admitting: Cardiology

## 2017-07-22 ENCOUNTER — Ambulatory Visit
Admission: RE | Admit: 2017-07-22 | Discharge: 2017-07-22 | Disposition: A | Discharge status: Home / Self Care | Payer: Medicare Other | Source: Ambulatory Visit | Attending: Internal Medicine | Admitting: Internal Medicine

## 2017-07-22 ENCOUNTER — Other Ambulatory Visit: Payer: Self-pay | Admitting: Internal Medicine

## 2017-07-22 DIAGNOSIS — R6889 Other general symptoms and signs: Secondary | ICD-10-CM | POA: Insufficient documentation

## 2017-07-22 DIAGNOSIS — J988 Other specified respiratory disorders: Secondary | ICD-10-CM

## 2017-07-22 DIAGNOSIS — R0989 Other specified symptoms and signs involving the circulatory and respiratory systems: Secondary | ICD-10-CM | POA: Diagnosis not present

## 2017-07-23 DIAGNOSIS — R1310 Dysphagia, unspecified: Secondary | ICD-10-CM | POA: Diagnosis not present

## 2017-07-23 DIAGNOSIS — I69354 Hemiplegia and hemiparesis following cerebral infarction affecting left non-dominant side: Secondary | ICD-10-CM | POA: Diagnosis not present

## 2017-07-23 DIAGNOSIS — Z431 Encounter for attention to gastrostomy: Secondary | ICD-10-CM | POA: Diagnosis not present

## 2017-07-23 DIAGNOSIS — I69391 Dysphagia following cerebral infarction: Secondary | ICD-10-CM | POA: Diagnosis not present

## 2017-07-23 DIAGNOSIS — J439 Emphysema, unspecified: Secondary | ICD-10-CM | POA: Diagnosis not present

## 2017-07-23 DIAGNOSIS — I69320 Aphasia following cerebral infarction: Secondary | ICD-10-CM | POA: Diagnosis not present

## 2017-07-25 ENCOUNTER — Encounter: Payer: Self-pay | Admitting: Physical Medicine & Rehabilitation

## 2017-07-25 ENCOUNTER — Encounter: Payer: Medicare Other | Attending: Physical Medicine & Rehabilitation

## 2017-07-25 ENCOUNTER — Ambulatory Visit (HOSPITAL_BASED_OUTPATIENT_CLINIC_OR_DEPARTMENT_OTHER): Payer: Medicare Other | Admitting: Physical Medicine & Rehabilitation

## 2017-07-25 VITALS — BP 158/76 | HR 89

## 2017-07-25 DIAGNOSIS — I69354 Hemiplegia and hemiparesis following cerebral infarction affecting left non-dominant side: Secondary | ICD-10-CM | POA: Diagnosis not present

## 2017-07-25 DIAGNOSIS — Z09 Encounter for follow-up examination after completed treatment for conditions other than malignant neoplasm: Secondary | ICD-10-CM

## 2017-07-25 DIAGNOSIS — I639 Cerebral infarction, unspecified: Secondary | ICD-10-CM | POA: Diagnosis not present

## 2017-07-25 DIAGNOSIS — Z431 Encounter for attention to gastrostomy: Secondary | ICD-10-CM | POA: Diagnosis not present

## 2017-07-25 DIAGNOSIS — Z931 Gastrostomy status: Secondary | ICD-10-CM | POA: Insufficient documentation

## 2017-07-25 DIAGNOSIS — Z87891 Personal history of nicotine dependence: Secondary | ICD-10-CM | POA: Insufficient documentation

## 2017-07-25 DIAGNOSIS — R1312 Dysphagia, oropharyngeal phase: Secondary | ICD-10-CM

## 2017-07-25 DIAGNOSIS — Z7901 Long term (current) use of anticoagulants: Secondary | ICD-10-CM | POA: Insufficient documentation

## 2017-07-25 DIAGNOSIS — R1310 Dysphagia, unspecified: Secondary | ICD-10-CM | POA: Diagnosis not present

## 2017-07-25 DIAGNOSIS — I4891 Unspecified atrial fibrillation: Secondary | ICD-10-CM | POA: Insufficient documentation

## 2017-07-25 DIAGNOSIS — R269 Unspecified abnormalities of gait and mobility: Secondary | ICD-10-CM | POA: Insufficient documentation

## 2017-07-25 DIAGNOSIS — I69322 Dysarthria following cerebral infarction: Secondary | ICD-10-CM

## 2017-07-25 DIAGNOSIS — J449 Chronic obstructive pulmonary disease, unspecified: Secondary | ICD-10-CM | POA: Diagnosis not present

## 2017-07-25 DIAGNOSIS — I69398 Other sequelae of cerebral infarction: Secondary | ICD-10-CM | POA: Insufficient documentation

## 2017-07-25 DIAGNOSIS — I10 Essential (primary) hypertension: Secondary | ICD-10-CM | POA: Insufficient documentation

## 2017-07-25 DIAGNOSIS — J439 Emphysema, unspecified: Secondary | ICD-10-CM | POA: Diagnosis not present

## 2017-07-25 DIAGNOSIS — I69391 Dysphagia following cerebral infarction: Secondary | ICD-10-CM | POA: Diagnosis not present

## 2017-07-25 DIAGNOSIS — E039 Hypothyroidism, unspecified: Secondary | ICD-10-CM | POA: Insufficient documentation

## 2017-07-25 DIAGNOSIS — I69320 Aphasia following cerebral infarction: Secondary | ICD-10-CM | POA: Diagnosis not present

## 2017-07-25 MED ORDER — NYSTATIN 100000 UNIT/ML MT SUSP
5.0000 mL | Freq: Four times a day (QID) | OROMUCOSAL | 0 refills | Status: DC
Start: 2017-07-25 — End: 2017-07-28

## 2017-07-25 NOTE — Progress Notes (Signed)
Subjective:    Patient ID: Ronald [name], male    DOB: 1940/07/04, 77 y.o.   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  He lives with male companion reportedly mobile and independent.  Plans to stay with daughter on discharge.  Cranial CT scan negative for acute changes, old left posterior frontal infarction.  The patient did receive tPA.  INR on admission of 1.23.  CT angiogram of head and neck showed apparent occlusion of right insular anterior temporal M2 branch.  CT cerebral perfusion scan showed acute changes in the right MCA territory compatible with infarction.  Echocardiogram with ejection fraction 60%.  No wall motion abnormalities.  Carotid arteriogram on June 19, 2017, showed no acute occlusions, AV shunting, or aneurysm.  The patient did require short-term intubation. MRI showed a 3.2 cm acute hemorrhage within the right lateral temporal lobe.  CT of the head showed evolving multifocal right MCA territory infarct with evolving focal hemorrhagic conversion in the right temporal lobe.  Aspirin for CVA prophylaxis was initially on hold as well as Coumadin   DATE OF ADMISSION:  06/23/2017 DATE OF DISCHARGE:  07/04/2017   HPI   PEG placed 06/19/2017 Patient remains n.p.o. Living at home daughter who is a nurse helps care for him.  She is performing oral hygiene on a daily basis.  Patient's mouth was clear after rehab but was getting some oral hygiene at least twice a day. Not needing suctioning 3/23- CXR for congestion negative  Getting PT, OT, SLP  Was losing weight on Jevity tube feeds, prior volume was 350 mL 4 times per day Currently using Jevity 4 x per day, her daughter patient has been gaining weight  Patient dressing and  bathing himself.  He needs assistance for his tube feeds    Pain Inventory Average Pain 10 Pain Right Now 10 My pain is aching  In the last 24 hours, has pain interfered with the following? General activity 2 Relation with others 0 Enjoyment of life 2 What TIME of day is your pain at its worst? all Sleep (in general) Fair  Pain is worse with: unsure Pain improves with: rest and medication Relief from Meds: 3  Mobility walk without assistance ability to climb steps?  yes do you drive?  no  Function disabled: date disabled . I need assistance with the following:  feeding, bathing, household duties and shopping  Neuro/Psych No problems in this area  Prior Studies Any changes since last visit?  no  Physicians involved in your care Any changes since last visit?  no   Family History  Problem Relation Age of Onset  . Parkinson's disease Mother   . Colon cancer Mother   . Emphysema Father   . Stroke Sister   . Stroke Brother    Social History   Socioeconomic History  . Marital status: Single    Spouse name: Not on file  . Number of children: Not on file  . Years of education: Not on file  . Highest education level: Not on file  Occupational History  . Not on file  Social Needs  . Financial resource strain: Not on file  . Food insecurity:    Worry: Not on file    Inability: Not on file  . Transportation needs:    Medical: Not on  file    Non-medical: Not on file  Tobacco Use  . Smoking status: Former Games developer  . Smokeless tobacco: Former Engineer, water and Sexual Activity  . Alcohol use: Not Currently  . Drug use: No  . Sexual activity: Not on file  Lifestyle  . Physical activity:    Days per week: Not on file    Minutes per session: Not on file  . Stress: Not on file  Relationships  . Social connections:    Talks on phone: Not on file    Gets together: Not on file    Attends religious service: Not on file    Active member of club or  organization: Not on file    Attends meetings of clubs or organizations: Not on file    Relationship status: Not on file  Other Topics Concern  . Not on file  Social History Narrative  . Not on file   Past Surgical History:  Procedure Laterality Date  . IR ANGIO EXTRACRAN SEL COM CAROTID INNOMINATE UNI R MOD SED  06/19/2017  . IR ANGIO VERTEBRAL SEL SUBCLAVIAN INNOMINATE UNI R MOD SED  06/19/2017  . IR GASTROSTOMY TUBE MOD SED  07/04/2017  . RADIOLOGY WITH ANESTHESIA N/A 06/19/2017   Procedure: RADIOLOGY WITH ANESTHESIA;  Surgeon: Julieanne Cotton, MD;  Location: [facility];  Service: Radiology;  Laterality: N/A;   Past Medical History:  Diagnosis Date  . COPD (chronic obstructive pulmonary disease) (HCC)   . Emphysema lung (HCC)   . Hx of long term use of blood thinners   . Hypertension   . Stroke [facility]) 2013  . Thyroid disease    hypothyroidism   There were no vitals taken for this visit.  Opioid Risk Score:   Fall Risk Score:  `1  Depression screen PHQ 2/9  No flowsheet data found.   Review of Systems  Constitutional: Positive for unexpected weight change.  HENT: Negative.   Eyes: Negative.   Respiratory: Positive for cough and wheezing.   Cardiovascular: Negative.   Gastrointestinal: Positive for diarrhea.  Endocrine: Negative.   Genitourinary: Negative.   Musculoskeletal: Positive for arthralgias, back pain and myalgias.  Skin: Negative.   Allergic/Immunologic: Negative.   Neurological: Positive for speech difficulty.  Hematological: Negative.   Psychiatric/Behavioral: Negative.   All other systems reviewed and are negative.      Objective:   Physical Exam  Constitutional: He is oriented to person, place, and time. He appears well-developed and well-nourished. No distress.  HENT:  Head: Normocephalic and atraumatic.  Whitish plaque on tongue no plaque on the buccal surfaces or on posterior pharynx.  No tonsillar enlargement  Eyes: Pupils are equal, round, and  reactive to light. Conjunctivae and EOM are normal.  Cardiovascular: Normal rate, regular rhythm and normal heart sounds.  No murmur heard. Pulmonary/Chest: Effort normal and breath sounds normal. No respiratory distress. He has no wheezes.  Abdominal: Soft. Bowel sounds are normal. He exhibits no distension. There is no tenderness.  Neurological: He is alert and oriented to person, place, and time.  Skin: Skin is warm and dry. He is not diaphoretic.  Psychiatric: He has a normal mood and affect. His behavior is normal. Thought content normal.  Nursing note and vitals reviewed.  Motor strength is 5/5 in the right deltoid bicep tricep grip hip flexor knee extensor ankle dorsiflexor 4/5 in left deltoid bicep tricep grip hip flexion knee extension ankle with flexion Severe dysarthria No apparent neglect Gait is wide-based no evidence of  toe drag or knee instability.  He does have some evidence of truncal ataxia.     Assessment & Plan:  1.  Right MCA infarct with severe dysphagia as well as gait disorder Continue n.p.o. Continue to require PT OT speech made referral to outpatient therapy. Repeat modified barium swallow once patient able to handle trial feeds with speech and language pathology pudding thick  2 status post PEG this is looking well he has had some weight loss but started to gain some back with change in tube feeding bolus size.  As discussed with daughter will need to monitor weight if he starts getting excessive weight may need to scale back his tube feedings to 400 mL

## 2017-07-25 NOTE — Patient Instructions (Signed)
New Orders  Increase Jevity to QID boluses Cont water boluses 4 times a day Diarrhea improved Have HH call if any questions about orders

## 2017-07-26 ENCOUNTER — Telehealth: Payer: Self-pay

## 2017-07-26 NOTE — Telephone Encounter (Signed)
Blanche Eastebbie Called today for this patient.  States that [facility] needs written orders stating the increase in his Jevity feeding supplements.

## 2017-07-27 ENCOUNTER — Telehealth: Payer: Self-pay

## 2017-07-27 DIAGNOSIS — R1310 Dysphagia, unspecified: Secondary | ICD-10-CM | POA: Diagnosis not present

## 2017-07-27 DIAGNOSIS — I69354 Hemiplegia and hemiparesis following cerebral infarction affecting left non-dominant side: Secondary | ICD-10-CM | POA: Diagnosis not present

## 2017-07-27 DIAGNOSIS — J439 Emphysema, unspecified: Secondary | ICD-10-CM | POA: Diagnosis not present

## 2017-07-27 DIAGNOSIS — I69391 Dysphagia following cerebral infarction: Secondary | ICD-10-CM | POA: Diagnosis not present

## 2017-07-27 DIAGNOSIS — I69320 Aphasia following cerebral infarction: Secondary | ICD-10-CM | POA: Diagnosis not present

## 2017-07-27 DIAGNOSIS — Z431 Encounter for attention to gastrostomy: Secondary | ICD-10-CM | POA: Diagnosis not present

## 2017-07-27 NOTE — Telephone Encounter (Signed)
Order signed and faxed to [facility]. 

## 2017-07-27 NOTE — Telephone Encounter (Signed)
Inform [facility] Jevity QID via tube

## 2017-07-27 NOTE — Telephone Encounter (Signed)
Contacted Ronald ChaDebbie [name] to get details.  An order was written on a script pad, placed on Dr. Wynn BankerKirsteins desk for signature. [facility] as soon as it is signed

## 2017-07-27 NOTE — Telephone Encounter (Signed)
Magic mouthwash 5cc on toothette QID to tongue 1 RF

## 2017-07-27 NOTE — Telephone Encounter (Signed)
Recieved fax from [facility] in reguards to the recently prescribed nystatin oral.  They state that it is currently on back order since the beginning of the year and is wanting to know if there is something else that the patient can try.  Please advise

## 2017-07-28 ENCOUNTER — Telehealth: Payer: Self-pay

## 2017-07-28 DIAGNOSIS — R1310 Dysphagia, unspecified: Secondary | ICD-10-CM | POA: Diagnosis not present

## 2017-07-28 DIAGNOSIS — J439 Emphysema, unspecified: Secondary | ICD-10-CM | POA: Diagnosis not present

## 2017-07-28 DIAGNOSIS — I69391 Dysphagia following cerebral infarction: Secondary | ICD-10-CM | POA: Diagnosis not present

## 2017-07-28 DIAGNOSIS — Z431 Encounter for attention to gastrostomy: Secondary | ICD-10-CM | POA: Diagnosis not present

## 2017-07-28 DIAGNOSIS — I69354 Hemiplegia and hemiparesis following cerebral infarction affecting left non-dominant side: Secondary | ICD-10-CM | POA: Diagnosis not present

## 2017-07-28 DIAGNOSIS — I69320 Aphasia following cerebral infarction: Secondary | ICD-10-CM | POA: Diagnosis not present

## 2017-07-28 MED ORDER — MAGIC MOUTHWASH: 5.0000 mL | Freq: Four times a day (QID) | ORAL | 1 refills | Status: DC

## 2017-07-31 ENCOUNTER — Telehealth: Payer: Self-pay | Admitting: *Deleted

## 2017-07-31 DIAGNOSIS — E785 Hyperlipidemia, unspecified: Secondary | ICD-10-CM | POA: Diagnosis not present

## 2017-07-31 DIAGNOSIS — I4891 Unspecified atrial fibrillation: Secondary | ICD-10-CM | POA: Diagnosis not present

## 2017-07-31 DIAGNOSIS — R634 Abnormal weight loss: Secondary | ICD-10-CM | POA: Diagnosis not present

## 2017-07-31 DIAGNOSIS — G459 Transient cerebral ischemic attack, unspecified: Secondary | ICD-10-CM | POA: Diagnosis not present

## 2017-07-31 NOTE — Telephone Encounter (Signed)
Ronald [name] Ronald [name] daughter called and says that they [facility] and they called and were told they did not have the referrals.  Can you please follow up on this.  I see they were placed. thx

## 2017-08-01 DIAGNOSIS — I69320 Aphasia following cerebral infarction: Secondary | ICD-10-CM | POA: Diagnosis not present

## 2017-08-01 DIAGNOSIS — R1310 Dysphagia, unspecified: Secondary | ICD-10-CM | POA: Diagnosis not present

## 2017-08-01 DIAGNOSIS — Z431 Encounter for attention to gastrostomy: Secondary | ICD-10-CM | POA: Diagnosis not present

## 2017-08-01 DIAGNOSIS — I69354 Hemiplegia and hemiparesis following cerebral infarction affecting left non-dominant side: Secondary | ICD-10-CM | POA: Diagnosis not present

## 2017-08-01 DIAGNOSIS — J439 Emphysema, unspecified: Secondary | ICD-10-CM | POA: Diagnosis not present

## 2017-08-01 DIAGNOSIS — I69391 Dysphagia following cerebral infarction: Secondary | ICD-10-CM | POA: Diagnosis not present

## 2017-08-02 DIAGNOSIS — I69354 Hemiplegia and hemiparesis following cerebral infarction affecting left non-dominant side: Secondary | ICD-10-CM | POA: Diagnosis not present

## 2017-08-02 DIAGNOSIS — J439 Emphysema, unspecified: Secondary | ICD-10-CM | POA: Diagnosis not present

## 2017-08-02 DIAGNOSIS — I69391 Dysphagia following cerebral infarction: Secondary | ICD-10-CM | POA: Diagnosis not present

## 2017-08-02 DIAGNOSIS — Z431 Encounter for attention to gastrostomy: Secondary | ICD-10-CM | POA: Diagnosis not present

## 2017-08-02 DIAGNOSIS — I69320 Aphasia following cerebral infarction: Secondary | ICD-10-CM | POA: Diagnosis not present

## 2017-08-02 DIAGNOSIS — R1310 Dysphagia, unspecified: Secondary | ICD-10-CM | POA: Diagnosis not present

## 2017-08-03 DIAGNOSIS — J439 Emphysema, unspecified: Secondary | ICD-10-CM | POA: Diagnosis not present

## 2017-08-03 DIAGNOSIS — I69354 Hemiplegia and hemiparesis following cerebral infarction affecting left non-dominant side: Secondary | ICD-10-CM | POA: Diagnosis not present

## 2017-08-03 DIAGNOSIS — I69320 Aphasia following cerebral infarction: Secondary | ICD-10-CM | POA: Diagnosis not present

## 2017-08-03 DIAGNOSIS — I69391 Dysphagia following cerebral infarction: Secondary | ICD-10-CM | POA: Diagnosis not present

## 2017-08-03 DIAGNOSIS — Z431 Encounter for attention to gastrostomy: Secondary | ICD-10-CM | POA: Diagnosis not present

## 2017-08-03 DIAGNOSIS — R1310 Dysphagia, unspecified: Secondary | ICD-10-CM | POA: Diagnosis not present

## 2017-08-04 DIAGNOSIS — I69391 Dysphagia following cerebral infarction: Secondary | ICD-10-CM | POA: Diagnosis not present

## 2017-08-04 DIAGNOSIS — I69320 Aphasia following cerebral infarction: Secondary | ICD-10-CM | POA: Diagnosis not present

## 2017-08-04 DIAGNOSIS — I69354 Hemiplegia and hemiparesis following cerebral infarction affecting left non-dominant side: Secondary | ICD-10-CM | POA: Diagnosis not present

## 2017-08-04 DIAGNOSIS — J439 Emphysema, unspecified: Secondary | ICD-10-CM | POA: Diagnosis not present

## 2017-08-04 DIAGNOSIS — R1310 Dysphagia, unspecified: Secondary | ICD-10-CM | POA: Diagnosis not present

## 2017-08-04 DIAGNOSIS — Z431 Encounter for attention to gastrostomy: Secondary | ICD-10-CM | POA: Diagnosis not present

## 2017-08-08 DIAGNOSIS — R1310 Dysphagia, unspecified: Secondary | ICD-10-CM | POA: Diagnosis not present

## 2017-08-08 DIAGNOSIS — I69320 Aphasia following cerebral infarction: Secondary | ICD-10-CM | POA: Diagnosis not present

## 2017-08-08 DIAGNOSIS — J439 Emphysema, unspecified: Secondary | ICD-10-CM | POA: Diagnosis not present

## 2017-08-08 DIAGNOSIS — I69391 Dysphagia following cerebral infarction: Secondary | ICD-10-CM | POA: Diagnosis not present

## 2017-08-08 DIAGNOSIS — Z431 Encounter for attention to gastrostomy: Secondary | ICD-10-CM | POA: Diagnosis not present

## 2017-08-08 DIAGNOSIS — I69354 Hemiplegia and hemiparesis following cerebral infarction affecting left non-dominant side: Secondary | ICD-10-CM | POA: Diagnosis not present

## 2017-08-09 DIAGNOSIS — I69320 Aphasia following cerebral infarction: Secondary | ICD-10-CM | POA: Diagnosis not present

## 2017-08-09 DIAGNOSIS — R1310 Dysphagia, unspecified: Secondary | ICD-10-CM | POA: Diagnosis not present

## 2017-08-09 DIAGNOSIS — Z431 Encounter for attention to gastrostomy: Secondary | ICD-10-CM | POA: Diagnosis not present

## 2017-08-09 DIAGNOSIS — I69354 Hemiplegia and hemiparesis following cerebral infarction affecting left non-dominant side: Secondary | ICD-10-CM | POA: Diagnosis not present

## 2017-08-09 DIAGNOSIS — J439 Emphysema, unspecified: Secondary | ICD-10-CM | POA: Diagnosis not present

## 2017-08-09 DIAGNOSIS — I69391 Dysphagia following cerebral infarction: Secondary | ICD-10-CM | POA: Diagnosis not present

## 2017-08-11 ENCOUNTER — Ambulatory Visit: Payer: Medicare Other | Attending: Physical Medicine & Rehabilitation | Admitting: Speech Pathology

## 2017-08-11 ENCOUNTER — Other Ambulatory Visit: Payer: Self-pay | Admitting: Physical Medicine & Rehabilitation

## 2017-08-11 ENCOUNTER — Other Ambulatory Visit: Payer: Self-pay

## 2017-08-11 ENCOUNTER — Encounter: Payer: Self-pay | Admitting: Speech Pathology

## 2017-08-11 DIAGNOSIS — R1312 Dysphagia, oropharyngeal phase: Secondary | ICD-10-CM | POA: Diagnosis not present

## 2017-08-11 DIAGNOSIS — R471 Dysarthria and anarthria: Secondary | ICD-10-CM | POA: Diagnosis not present

## 2017-08-11 NOTE — Therapy (Signed)
from skilled speech therapy for restorative and compensatory treatment of dysarthria and dysphagia.  Specific dysphagia goals will be determined once the MBS is completed.    Speech Therapy Frequency  2x / week    Duration  Other (comment) 8 weeks    Treatment/Interventions  Pharyngeal strengthening exercises;Diet toleration management by SLP;Oral motor exercises;Compensatory strategies;SLP instruction and feedback;Patient/family education    Potential to Achieve Goals  Good    Potential Considerations  Ability to learn/carryover information;Pain level;Family/community support;Co-morbidities;Previous level of function;Cooperation/participation level;Severity of impairments;Medical prognosis    SLP Home Exercise Plan  Respiratory support activities, NPO swallowing and voice activities    Consulted and Agree with Plan of Care  Patient;Family member/caregiver    Family Member Consulted  Ronald [name]       Patient will benefit from skilled therapeutic intervention in order to improve the following deficits and impairments:   Dysarthria and anarthria - Plan: SLP plan of care cert/re-cert  Oropharyngeal dysphagia - Plan: SLP plan of care cert/re-cert    Problem List Patient Active Problem List   Diagnosis Date Noted  . PEG (percutaneous endoscopic gastrostomy) status (HCC)   . Benign essential HTN   . Hypertensive crisis   . Dysarthria, post-stroke   . Acute pain of right shoulder   . 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 Dysphagia, oropharyngeal 06/23/2017  . Respiratory failure (HCC)   . Palliative care encounter   . Aspiration pneumonia due to gastric secretions (HCC)   . Atrial fibrillation (HCC)   . 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   Ronald PrimroseSusan G Jhordyn Hoopingarner, [name] SLP  Ronald [name], Ronald [name] 08/11/2017, 5:13 PM  [facility]  MAIN [facility] [address] [place], [place], [address] Phone: [phone]   Fax:  [phone]  Name: Ronald [name] MRN: [id number] Date of Birth: [birth year]  [name] [address] [place], [place], [address] Phone: [phone]   Fax:  [phone]  Speech Language Pathology Evaluation  Patient Details  Name: Ronald [name] MRN: [id number] Date of Birth: Mar 29, 1941 Referring Provider: [name]   Encounter Date: 08/11/2017  End of Session - 08/11/17 1704    Visit Number  1    Number of Visits  17    Date for SLP Re-Evaluation  10/13/17    SLP Start Time  1400    SLP Stop Time   1456    SLP Time Calculation (min)  56 min    Activity Tolerance  Patient tolerated treatment well       Past Medical History:  Diagnosis Date  . COPD (chronic obstructive pulmonary disease) (HCC)   . Emphysema lung (HCC)   . Hx of long term use of blood thinners   . Hypertension   . Stroke [facility]) 2013  . Thyroid disease    hypothyroidism    Past Surgical History:  Procedure Laterality Date  . IR ANGIO EXTRACRAN SEL COM CAROTID INNOMINATE UNI R MOD SED  06/19/2017  . IR ANGIO VERTEBRAL SEL SUBCLAVIAN INNOMINATE UNI R MOD SED  06/19/2017  . IR GASTROSTOMY TUBE MOD SED  07/04/2017  . RADIOLOGY WITH ANESTHESIA N/A 06/19/2017   Procedure: RADIOLOGY WITH ANESTHESIA;  Surgeon: Julieanne Cotton, MD;  Location: [facility] OR;  Service: Radiology;  Laterality: N/A;    There were no vitals filed for this visit.      SLP Evaluation [facility] - 08/11/17 0001      SLP Visit Information   SLP Received On  08/11/17    Referring Provider  [name]    Onset Date  06/19/2017    Medical Diagnosis  CVA      Subjective   Subjective  The patient was initially non-verbal, but attempted speech with encouragement    Patient/Family Stated Goal  Primary goal is to eat and drink.      Pain Assessment   Pain Score  0-No pain      General Information   HPI  77 year old man, with history of prior stroke with speech and language deficits, with right hemisphere CVA 06/19/2017 with profound dysarthria and  severe oropharyngeal dysphagia.  The patient received inpatient rehab services 06/23/2017 - 07/04/2017 and home health services on discharge.        Prior Functional Status   Cognitive/Linguistic Baseline  Baseline deficits    Baseline deficit details  Prior stroke with speech and language deficits      Auditory Comprehension   Overall Auditory Comprehension  Impaired    Yes/No Questions  Impaired    Complex Questions  75-100% accurate    Commands  Impaired    Multistep Basic Commands  50-74% accurate    Conversation  Simple      Verbal Expression   Overall Verbal Expression  Impaired    Initiation  No impairment    Automatic Speech  Name;Social Response    Level of Generative/Spontaneous Verbalization  Word Dysarthria is primary problem    Naming  Impairment    Responsive  76-100% accurate    Confrontation  75-100% accurate      Oral Motor/Sensory Function   Overall Oral Motor/Sensory Function  Impaired    Labial ROM  Reduced right;Reduced left    Labial Symmetry  Abnormal symmetry left    Labial Strength  Reduced  [name] [address] [place], [place], [address] Phone: [phone]   Fax:  [phone]  Speech Language Pathology Evaluation  Patient Details  Name: Ronald [name] MRN: [id number] Date of Birth: Mar 29, 1941 Referring Provider: [name]   Encounter Date: 08/11/2017  End of Session - 08/11/17 1704    Visit Number  1    Number of Visits  17    Date for SLP Re-Evaluation  10/13/17    SLP Start Time  1400    SLP Stop Time   1456    SLP Time Calculation (min)  56 min    Activity Tolerance  Patient tolerated treatment well       Past Medical History:  Diagnosis Date  . COPD (chronic obstructive pulmonary disease) (HCC)   . Emphysema lung (HCC)   . Hx of long term use of blood thinners   . Hypertension   . Stroke [facility]) 2013  . Thyroid disease    hypothyroidism    Past Surgical History:  Procedure Laterality Date  . IR ANGIO EXTRACRAN SEL COM CAROTID INNOMINATE UNI R MOD SED  06/19/2017  . IR ANGIO VERTEBRAL SEL SUBCLAVIAN INNOMINATE UNI R MOD SED  06/19/2017  . IR GASTROSTOMY TUBE MOD SED  07/04/2017  . RADIOLOGY WITH ANESTHESIA N/A 06/19/2017   Procedure: RADIOLOGY WITH ANESTHESIA;  Surgeon: Julieanne Cotton, MD;  Location: [facility] OR;  Service: Radiology;  Laterality: N/A;    There were no vitals filed for this visit.      SLP Evaluation [facility] - 08/11/17 0001      SLP Visit Information   SLP Received On  08/11/17    Referring Provider  [name]    Onset Date  06/19/2017    Medical Diagnosis  CVA      Subjective   Subjective  The patient was initially non-verbal, but attempted speech with encouragement    Patient/Family Stated Goal  Primary goal is to eat and drink.      Pain Assessment   Pain Score  0-No pain      General Information   HPI  77 year old man, with history of prior stroke with speech and language deficits, with right hemisphere CVA 06/19/2017 with profound dysarthria and  severe oropharyngeal dysphagia.  The patient received inpatient rehab services 06/23/2017 - 07/04/2017 and home health services on discharge.        Prior Functional Status   Cognitive/Linguistic Baseline  Baseline deficits    Baseline deficit details  Prior stroke with speech and language deficits      Auditory Comprehension   Overall Auditory Comprehension  Impaired    Yes/No Questions  Impaired    Complex Questions  75-100% accurate    Commands  Impaired    Multistep Basic Commands  50-74% accurate    Conversation  Simple      Verbal Expression   Overall Verbal Expression  Impaired    Initiation  No impairment    Automatic Speech  Name;Social Response    Level of Generative/Spontaneous Verbalization  Word Dysarthria is primary problem    Naming  Impairment    Responsive  76-100% accurate    Confrontation  75-100% accurate      Oral Motor/Sensory Function   Overall Oral Motor/Sensory Function  Impaired    Labial ROM  Reduced right;Reduced left    Labial Symmetry  Abnormal symmetry left    Labial Strength  Reduced

## 2017-08-11 NOTE — Telephone Encounter (Signed)
Patient in need of cholesterol medication.  Medication prescribed at hospital discharge. Dr. Wynn BankerKirsteins is a Physical Medicine and Rehabilitation provider.  He is deferring cholesterol maintenance to primary care.

## 2017-08-14 ENCOUNTER — Other Ambulatory Visit: Payer: Self-pay | Admitting: Physical Medicine & Rehabilitation

## 2017-08-14 ENCOUNTER — Ambulatory Visit: Payer: Medicare Other | Admitting: Speech Pathology

## 2017-08-14 ENCOUNTER — Encounter: Payer: Self-pay | Admitting: Speech Pathology

## 2017-08-14 DIAGNOSIS — R1310 Dysphagia, unspecified: Secondary | ICD-10-CM

## 2017-08-14 DIAGNOSIS — R1312 Dysphagia, oropharyngeal phase: Secondary | ICD-10-CM

## 2017-08-14 DIAGNOSIS — R471 Dysarthria and anarthria: Secondary | ICD-10-CM | POA: Diagnosis not present

## 2017-08-14 NOTE — Therapy (Signed)
Title  Pt will improve speech intelligibility for words and phrases by controlling rate of speech, over-articulation, and increased loudness to achieve 80% intelligibility with min. SLP cues.    Time  8    Period  Weeks    Status  New    Target Date  10/13/17      SLP LONG TERM GOAL #2   Title  Patient will execute Swallowing and Voice Building HEP 5-7 days per week, with assistance from family as needed.    Time  8    Period  Weeks    Status  New    Target Date  10/13/17      SLP LONG TERM GOAL #3   Title  Patient will tolerate least restrictive oral diet pending MBS, to be scheduled.    Time  8    Period  Weeks     Status  New    Target Date  10/13/17       Plan - 08/14/17 1341    Clinical Impression Statement  The patient has poor respiration support and control.  Will continue to address speech and swallowing via high effort/high intensity vocal exercises.    Speech Therapy Frequency  2x / week    Duration  Other (comment)    Treatment/Interventions  Pharyngeal strengthening exercises;Diet toleration management by SLP;Oral motor exercises;Compensatory strategies;SLP instruction and feedback;Patient/family education    Potential Considerations  Ability to learn/carryover information;Pain level;Family/community support;Co-morbidities;Previous level of function;Cooperation/participation level;Severity of impairments;Medical prognosis    SLP Home Exercise Plan  Respiratory support activities, NPO swallowing and voice activities    Consulted and Agree with Plan of Care  Patient       Patient will benefit from skilled therapeutic intervention in order to improve the following deficits and impairments:   Dysarthria and anarthria  Oropharyngeal dysphagia    Problem List Patient Active Problem List   Diagnosis Date Noted  . PEG (percutaneous endoscopic gastrostomy) status (HCC)   . Benign essential HTN   . Hypertensive crisis   . Dysarthria, post-stroke   . Acute pain of right shoulder   . 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 Dysphagia, oropharyngeal 06/23/2017  . Respiratory failure (HCC)   . Palliative care encounter   . Aspiration pneumonia due to gastric secretions (HCC)   . Atrial fibrillation (HCC)   . 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   Dollene PrimroseSusan G Aydden Cumpian, [name] SLP  Leandrew KoyanagiAbernathy, Susie 08/14/2017, 1:42 PM  [facility] [address] [place], [place], [address] Phone: [phone]   Fax:  [phone]   Name: Ronald [name] MRN: [id number] Date of Birth: [birth year]  [facility] MAIN [facility] SERVICES [address] [place], [place], [address] Phone: [phone]   Fax:  [phone]  Speech Language Pathology Treatment  Patient Details  Name: Ronald [name] MRN: [id number] Date of Birth: 1941-04-24 Referring Provider: [name]   Encounter Date: 08/14/2017  End of Session - 08/14/17 1340    Visit Number  2    Number of Visits  17    Date for SLP Re-Evaluation  10/13/17    SLP Start Time  0900    SLP Stop Time   0951    SLP Time Calculation (min)  51 min    Activity Tolerance  Patient tolerated treatment well       Past Medical History:  Diagnosis Date  . COPD (chronic obstructive pulmonary disease) (HCC)   . Emphysema lung (HCC)   . Hx of long term use of blood thinners   . Hypertension   . Stroke [facility]) 2013  . Thyroid disease    hypothyroidism    Past Surgical History:  Procedure Laterality Date  . IR ANGIO EXTRACRAN SEL COM CAROTID INNOMINATE UNI R MOD SED  06/19/2017  . IR ANGIO VERTEBRAL SEL SUBCLAVIAN INNOMINATE UNI R MOD SED  06/19/2017  . IR GASTROSTOMY TUBE MOD SED  07/04/2017  . RADIOLOGY WITH ANESTHESIA N/A 06/19/2017   Procedure: RADIOLOGY WITH ANESTHESIA;  Surgeon: Julieanne Cotton, MD;  Location: [facility] OR;  Service: Radiology;  Laterality: N/A;    There were no vitals filed for this visit.  Subjective Assessment - 08/14/17 1339    Subjective  Patient appears eager to work            ADULT SLP TREATMENT - 08/14/17 0001      General Information   Behavior/Cognition  Alert;Cooperative;Pleasant mood;Distractible    HPI  77 year old man, with history of prior stroke with speech and language deficits, with right hemisphere CVA 06/19/2017 with profound dysarthria and severe oropharyngeal dysphagia.  The patient received inpatient rehab services 06/23/2017 - 07/04/2017 and home health services on discharge.         Treatment Provided   Treatment provided   Cognitive-Linquistic;Dysphagia      Pain Assessment   Pain Assessment  No/denies pain      Cognitive-Linquistic Treatment   Treatment focused on  Dysarthria;Apraxia;Voice;Other (comment);Patient/family/caregiver education Dysphagia    Skilled Treatment  BLOWING: Patient cannot sustain blowing.  PHONATION: Patient is able to generate a loud "ah" but cannot sustain beyond a second.  Patient able to imitate 2-syllable words, maintaining loudness for both syllables with 75% accuracy.  ORAL MOTOR EXERCISES: Patient instructed in tongue resistance exercises.  MOTOR SPEECH: Patient has difficulty with tongue elevation for specific sounds.  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      Assessment / Recommendations / Plan   Plan  Continue with current plan of care      Progression Toward Goals   Progression toward goals  Progressing toward goals       SLP Education - 08/14/17 1339    Education provided  Yes    Education Details  Vocal loudness- high effort/high intensity vocal exercises    Person(s) Educated  Patient    Methods  Explanation;Demonstration;Verbal cues;Handout    Comprehension  Verbalized understanding;Need further instruction         SLP Long Term Goals - 08/11/17 1709      SLP LONG TERM GOAL #1

## 2017-08-16 ENCOUNTER — Ambulatory Visit: Payer: Medicare Other | Admitting: Speech Pathology

## 2017-08-16 ENCOUNTER — Encounter: Payer: Self-pay | Admitting: Speech Pathology

## 2017-08-16 DIAGNOSIS — R471 Dysarthria and anarthria: Secondary | ICD-10-CM | POA: Diagnosis not present

## 2017-08-16 DIAGNOSIS — R1312 Dysphagia, oropharyngeal phase: Secondary | ICD-10-CM | POA: Diagnosis not present

## 2017-08-16 NOTE — Therapy (Signed)
[place] [facility] MAIN [facility] SERVICES [address] [place], [place], [address] Phone: [phone]   Fax:  [phone]  Speech Language Pathology Treatment  Patient Details  Name: Ronald [name] MRN: [id number] Date of Birth: 08/17/1940 Referring Provider: [name]   Encounter Date: 08/16/2017  End of Session - 08/16/17 1002    Visit Number  3    Number of Visits  17    Date for SLP Re-Evaluation  10/13/17    SLP Start Time  0904    SLP Stop Time   0950    SLP Time Calculation (min)  46 min    Activity Tolerance  Patient tolerated treatment well       Past Medical History:  Diagnosis Date  . COPD (chronic obstructive pulmonary disease) (HCC)   . Emphysema lung (HCC)   . Hx of long term use of blood thinners   . Hypertension   . Stroke [facility]) 2013  . Thyroid disease    hypothyroidism    Past Surgical History:  Procedure Laterality Date  . IR ANGIO EXTRACRAN SEL COM CAROTID INNOMINATE UNI R MOD SED  06/19/2017  . IR ANGIO VERTEBRAL SEL SUBCLAVIAN INNOMINATE UNI R MOD SED  06/19/2017  . IR GASTROSTOMY TUBE MOD SED  07/04/2017  . RADIOLOGY WITH ANESTHESIA N/A 06/19/2017   Procedure: RADIOLOGY WITH ANESTHESIA;  Surgeon: Julieanne Cotton, MD;  [facility] OR;  Service: Radiology;  Laterality: N/A;    There were no vitals filed for this visit.  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  The patient received inpatient rehab services 06/23/2017 - 07/04/2017 and home health services on discharge.         Treatment Provided   Treatment provided  Cognitive-Linquistic       Pain Assessment   Pain Assessment  No/denies pain      Cognitive-Linquistic Treatment   Treatment focused on  Dysarthria;Apraxia;Voice;Other (comment);Patient/family/caregiver education Dysphagia    Skilled Treatment  BLOWING: Patient able to sustain blowing for a second longer.  PHONATION: Patient is able to generate a loud "ah" and was able to sustain X2 seconds 3X.  Patient able to imitate 3 word phrases, maintaining loudness for all words with 75% accuracy.  Name pictured objects, maintaining loudness, with 80% accuracy.        Assessment / Recommendations / Plan   Plan  Continue with current plan of care      Progression Toward Goals   Progression toward goals  Progressing toward goals       SLP Education - 08/16/17 1001    Education provided  Yes    Education Details  high effort/high intensity vocal exercises    Person(s) Educated  Patient    Methods  Explanation    Comprehension  Verbalized understanding;Need further instruction         SLP Long Term Goals - 08/11/17 1709      SLP LONG TERM GOAL #1   Title  Pt will improve speech intelligibility for words and phrases by controlling rate of speech, over-articulation, and increased loudness to  achieve 80% intelligibility with min. SLP cues.    Time  8    Period  Weeks    Status  New    Target Date  10/13/17      SLP LONG TERM GOAL #2   Title  Patient will execute Swallowing and Voice Building HEP 5-7 days per week, with assistance from family as needed.    Time  8    Period  Weeks    Status  New    Target Date  10/13/17      SLP LONG TERM GOAL #3   Title  Patient will tolerate least restrictive oral diet pending MBS, to be scheduled.    Time  8    Period  Weeks    Status  New    Target Date  10/13/17       Plan - 08/16/17 1002    Clinical Impression Statement  The patient demonstrates improving respiration support and control as he learns how to do the exercises and improves vocal strength and endurance.   Will continue to address speech and swallowing via high effort/high intensity vocal exercises.    Speech Therapy Frequency  2x / week    Duration  Other (comment)    Treatment/Interventions  Pharyngeal strengthening exercises;Diet toleration management by SLP;Oral motor exercises;Compensatory strategies;SLP instruction and feedback;Patient/family education    Potential to Achieve Goals  Good    Potential Considerations  Ability to learn/carryover information;Pain level;Family/community support;Co-morbidities;Previous level of function;Cooperation/participation level;Severity of impairments;Medical prognosis    SLP Home Exercise Plan  Respiratory support activities, NPO swallowing and voice activities    Consulted and Agree with Plan of Care  Patient       Patient will benefit from skilled therapeutic intervention in order to improve the following deficits and impairments:   Dysarthria and anarthria  Oropharyngeal dysphagia    Problem List Patient Active Problem List   Diagnosis Date Noted  . PEG (percutaneous endoscopic gastrostomy) status (HCC)   . Benign essential HTN   . Hypertensive crisis   . Dysarthria, post-stroke   . Acute pain of right shoulder   . 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 Dysphagia, oropharyngeal 06/23/2017  . Respiratory failure (HCC)   . Palliative care encounter   . Aspiration pneumonia due to gastric secretions (HCC)   . Atrial fibrillation (HCC)   . 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

## 2017-08-23 ENCOUNTER — Ambulatory Visit: Payer: Medicare Other | Admitting: Speech Pathology

## 2017-08-23 DIAGNOSIS — R1312 Dysphagia, oropharyngeal phase: Secondary | ICD-10-CM | POA: Diagnosis not present

## 2017-08-23 DIAGNOSIS — R471 Dysarthria and anarthria: Secondary | ICD-10-CM

## 2017-08-23 NOTE — Therapy (Signed)
[place] [facility] MAIN [facility] SERVICES [address] [place], [place], [address] Phone: [phone]   Fax:  [phone]  Speech Language Pathology Treatment  Patient Details  Name: Ronald [name] MRN: [id number] Date of Birth: 1941-01-22 Referring Provider: [name]   Encounter Date: 08/23/2017  End of Session - 08/23/17 1500    Visit Number  4    Number of Visits  17    Date for SLP Re-Evaluation  10/13/17    SLP Start Time  1404    SLP Stop Time   1454    SLP Time Calculation (min)  50 min    Activity Tolerance  Patient tolerated treatment well       Past Medical History:  Diagnosis Date  . COPD (chronic obstructive pulmonary disease) (HCC)   . Emphysema lung (HCC)   . Hx of long term use of blood thinners   . Hypertension   . Stroke [facility]) 2013  . Thyroid disease    hypothyroidism    Past Surgical History:  Procedure Laterality Date  . IR ANGIO EXTRACRAN SEL COM CAROTID INNOMINATE UNI R MOD SED  06/19/2017  . IR ANGIO VERTEBRAL SEL SUBCLAVIAN INNOMINATE UNI R MOD SED  06/19/2017  . IR GASTROSTOMY TUBE MOD SED  07/04/2017  . RADIOLOGY WITH ANESTHESIA N/A 06/19/2017   Procedure: RADIOLOGY WITH ANESTHESIA;  Surgeon: Julieanne Cotton, MD;  [facility] OR;  Service: Radiology;  Laterality: N/A;    There were no vitals filed for this visit.         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  The patient received inpatient rehab services 06/23/2017 - 07/04/2017 and home health services on discharge.         Treatment Provided   Treatment provided  Cognitive-Linquistic      Pain Assessment   Pain Assessment  No/denies pain      Cognitive-Linquistic Treatment    Treatment focused on  Dysarthria;Apraxia;Voice;Other (comment);Patient/family/caregiver education Dysphagia    Skilled Treatment  BLOWING: Patient able to sustain blowing for a second longer.  PHONATION: Patient is able to generate a loud "ah" and was able to sustain X2 seconds 3X.  Patient able to imitate 3 word phrases, maintaining loudness for all words with 75% accuracy.  Name pictured objects, maintaining loudness, with 80% accuracy.        Assessment / Recommendations / Plan   Plan  Continue with current plan of care      Progression Toward Goals   Progression toward goals  Progressing toward goals       SLP Education - 08/23/17 1459    Education provided  Yes    Education Details  effort/high intensity vocal exercises    Person(s) Educated  Patient    Methods  Explanation    Comprehension  Verbalized understanding;Need further instruction         SLP Long Term Goals - 08/11/17 1709      SLP LONG TERM GOAL #1   Title  Pt will improve speech intelligibility for words and phrases by controlling rate of speech, over-articulation, and increased loudness to achieve 80% intelligibility with min. SLP cues.    Time  8    Period  Weeks  Status  New    Target Date  10/13/17      SLP LONG TERM GOAL #2   Title  Patient will execute Swallowing and Voice Building HEP 5-7 days per week, with assistance from family as needed.    Time  8    Period  Weeks    Status  New    Target Date  10/13/17      SLP LONG TERM GOAL #3   Title  Patient will tolerate least restrictive oral diet pending MBS, to be scheduled.    Time  8    Period  Weeks    Status  New    Target Date  10/13/17       Plan - 08/23/17 1500    Clinical Impression Statement  The patient demonstrates improving respiration support and control as he learns how to do the exercises and improves vocal strength and endurance.  Will continue to address speech and swallowing via high effort/high intensity vocal exercises.     Speech Therapy Frequency  2x / week    Duration  Other (comment)    Treatment/Interventions  Pharyngeal strengthening exercises;Diet toleration management by SLP;Oral motor exercises;Compensatory strategies;SLP instruction and feedback;Patient/family education    Potential to Achieve Goals  Good    Potential Considerations  Ability to learn/carryover information;Pain level;Family/community support;Co-morbidities;Previous level of function;Cooperation/participation level;Severity of impairments;Medical prognosis    SLP Home Exercise Plan  Respiratory support activities, NPO swallowing and voice activities    Consulted and Agree with Plan of Care  Patient       Patient will benefit from skilled therapeutic intervention in order to improve the following deficits and impairments:   Dysarthria and anarthria  Oropharyngeal dysphagia    Problem List Patient Active Problem List   Diagnosis Date Noted  . PEG (percutaneous endoscopic gastrostomy) status (HCC)   . Benign essential HTN   . Hypertensive crisis   . Dysarthria, post-stroke   . Acute pain of right shoulder   . 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 Dysphagia, oropharyngeal 06/23/2017  . Respiratory failure (HCC)   . Palliative care encounter   . Aspiration pneumonia due to gastric secretions (HCC)   . Atrial fibrillation (HCC)   . 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 [place], [place], [address] Phone: [phone]   Fax:  [phone]   Name: Ronald [name] MRN: [id number] Date of Birth: 01/14/1941

## 2017-08-25 ENCOUNTER — Ambulatory Visit: Payer: Medicare Other | Admitting: Speech Pathology

## 2017-08-25 ENCOUNTER — Encounter: Payer: Self-pay | Admitting: Speech Pathology

## 2017-08-25 DIAGNOSIS — R1312 Dysphagia, oropharyngeal phase: Secondary | ICD-10-CM | POA: Diagnosis not present

## 2017-08-25 DIAGNOSIS — R471 Dysarthria and anarthria: Secondary | ICD-10-CM

## 2017-08-25 NOTE — Therapy (Signed)
Education Details  overarticulation, emphasis of each syllable, lingual exercises    Person(s) Educated  Patient friend   friend   Methods  Explanation;Demonstration;Handout    Comprehension  Verbalized understanding;Need further instruction         SLP Long Term Goals - 08/25/17 1328      SLP LONG TERM GOAL #1   Title  Pt will improve speech intelligibility for words and phrases by controlling rate of speech, over-articulation, and increased loudness to achieve 80% intelligibility with min. SLP cues.    Time  8    Period  Weeks    Status  On-going    Target Date  10/13/17      SLP LONG TERM GOAL #2   Title  Patient will execute Swallowing and Voice Building HEP 5-7 days per week,  with assistance from family as needed.    Time  8    Period  Weeks    Status  On-going    Target Date  10/13/17      SLP LONG TERM GOAL #3   Title  Patient will tolerate least restrictive oral diet pending MBS, to be scheduled.    Time  8    Period  Weeks    Status  On-going    Target Date  10/13/17       Plan - 08/25/17 1326    Clinical Impression Statement  Pt presents with severely dysarthric speech, minimal lingual movement, and congested nonproductive cough. Pt is making gradual progress, and would benefit from continued skilled ST intervention focusing on breath support, oral strength, and increased intelligibility.     Speech Therapy Frequency  2x / week    Duration  Other (comment)    Treatment/Interventions  Pharyngeal strengthening exercises;Diet toleration management by SLP;Oral motor exercises;Compensatory strategies;SLP instruction and feedback;Patient/family education    Potential to Achieve Goals  Good    Potential Considerations  Ability to learn/carryover information;Pain level;Family/community support;Co-morbidities;Previous level of function;Cooperation/participation level;Severity of impairments;Medical prognosis    SLP Home Exercise Plan  Respiratory support activities, NPO swallowing and voice activities    Consulted and Agree with Plan of Care  Patient       Patient will benefit from skilled therapeutic intervention in order to improve the following deficits and impairments:   Dysarthria and anarthria    Problem List Patient Active Problem List   Diagnosis Date Noted  . PEG (percutaneous endoscopic gastrostomy) status (HCC)   . Benign essential HTN   . Hypertensive crisis   . Dysarthria, post-stroke   . Acute pain of right shoulder   . 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 Dysphagia, oropharyngeal 06/23/2017  . Respiratory failure (HCC)   . Palliative care encounter   . Aspiration pneumonia due to gastric secretions (HCC)   . Atrial  fibrillation (HCC)   . 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 Murvin NatalBueche, [facility], CCC-SLP Speech Language Pathologist  Leigh AuroraBueche, Celia Brown 08/25/2017, 1:30 PM  [place] [facility] MAIN [facility] [address] [place], [place], [address] Phone: [phone]   Fax:  [phone]   Name: Marinus MawOtis L Moudy MRN: [id number] Date of Birth: 06-23-1940  [facility] MAIN [facility] SERVICES [address] [place], [place], [address] Phone: [phone]   Fax:  [phone]  Speech Language Pathology Treatment  Patient Details  Name: AYRTON MCVAY MRN: [id number] Date of Birth: [birth year] Referring Provider: [name]   Encounter Date: 08/25/2017  End of Session - 08/25/17 1326    Visit Number  5    Number of Visits  17    Date for SLP Re-Evaluation  10/13/17    SLP Start Time  1100    SLP Stop Time   1145    SLP Time Calculation (min)  45 min    Activity Tolerance  Patient tolerated treatment well       Past Medical History:  Diagnosis Date  . COPD (chronic obstructive pulmonary disease) (HCC)   . Emphysema lung (HCC)   . Hx of long term use of blood thinners   . Hypertension   . Stroke [facility]) 2013  . Thyroid disease    hypothyroidism    Past Surgical History:  Procedure Laterality Date  . IR ANGIO EXTRACRAN SEL COM CAROTID INNOMINATE UNI R MOD SED  06/19/2017  . IR ANGIO VERTEBRAL SEL SUBCLAVIAN INNOMINATE UNI R MOD SED  06/19/2017  . IR GASTROSTOMY TUBE MOD SED  07/04/2017  . RADIOLOGY WITH ANESTHESIA N/A 06/19/2017   Procedure: RADIOLOGY WITH ANESTHESIA;  Surgeon: Julieanne Cotton, MD;  [facility] OR;  Service: Radiology;  Laterality: N/A;    There were no vitals filed for this visit.  Subjective Assessment - 08/25/17 1325    Subjective  Pt very pleasant and cooperative with unfamiliar therapist    Currently in Pain?  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  The patient received inpatient rehab services 06/23/2017 - 07/04/2017 and home health services on discharge.         Treatment Provided   Treatment provided  Cognitive-Linquistic      Pain Assessment   Pain Assessment  No/denies pain      Cognitive-Linquistic Treatment   Treatment focused on  Dysarthria;Apraxia;Voice;Other (comment);Patient/family/caregiver education Dysphagia    Skilled Treatment  BLOWING: Patient able to sustain blowing party favor, extending the tube the full length.    PHONATION: Patient is able to generate a loud "ah" and was able to sustain 3 seconds 2X. Most productions were short ah-ah-ah. SLP encouraged trying ah from low to high to low pitch. No pitch change with production of /ee/. Pt able to count to 3 on one breath, but was unable to count to 5 on one breath.   SPEECH: Patient able to imitate social phrases, maintaining loudness for all words with 90% accuracy.  Pt provided days of the week with good loudness, requiring cues to articulate each syllable. Cues also required for repetition of the [place]. Name pictured objects with 100% accuracy.        Assessment / Recommendations / Plan   Plan  Continue with current plan of care      Progression Toward Goals   Progression toward goals  Progressing toward goals       SLP Education - 08/25/17 1325    Education provided  Yes

## 2017-08-30 ENCOUNTER — Ambulatory Visit: Payer: Medicare Other | Attending: Physical Medicine & Rehabilitation | Admitting: Speech Pathology

## 2017-08-30 DIAGNOSIS — R278 Other lack of coordination: Secondary | ICD-10-CM | POA: Insufficient documentation

## 2017-08-30 DIAGNOSIS — R2681 Unsteadiness on feet: Secondary | ICD-10-CM | POA: Diagnosis not present

## 2017-08-30 DIAGNOSIS — R4702 Dysphasia: Secondary | ICD-10-CM | POA: Insufficient documentation

## 2017-08-30 DIAGNOSIS — R1312 Dysphagia, oropharyngeal phase: Secondary | ICD-10-CM

## 2017-08-30 DIAGNOSIS — R634 Abnormal weight loss: Secondary | ICD-10-CM | POA: Diagnosis not present

## 2017-08-30 DIAGNOSIS — I4891 Unspecified atrial fibrillation: Secondary | ICD-10-CM | POA: Diagnosis not present

## 2017-08-30 DIAGNOSIS — G459 Transient cerebral ischemic attack, unspecified: Secondary | ICD-10-CM | POA: Diagnosis not present

## 2017-08-30 DIAGNOSIS — M6281 Muscle weakness (generalized): Secondary | ICD-10-CM | POA: Insufficient documentation

## 2017-08-30 DIAGNOSIS — E785 Hyperlipidemia, unspecified: Secondary | ICD-10-CM | POA: Diagnosis not present

## 2017-08-30 DIAGNOSIS — R471 Dysarthria and anarthria: Secondary | ICD-10-CM | POA: Insufficient documentation

## 2017-08-31 ENCOUNTER — Encounter: Payer: Self-pay | Admitting: Speech Pathology

## 2017-08-31 NOTE — Therapy (Signed)
[place] [facility] MAIN [facility] SERVICES [address] [place], [place], [address] Phone: [phone]   Fax:  [phone]  Speech Language Pathology Treatment  Patient Details  Name: Ronald [name] MRN: [id number] Date of Birth: 06/22/1940 Referring Provider: [name]   Encounter Date: 08/30/2017  End of Session - 08/31/17 1614    Visit Number  6    Number of Visits  17    Date for SLP Re-Evaluation  10/13/17    SLP Start Time  1400    SLP Stop Time   1450    SLP Time Calculation (min)  50 min    Activity Tolerance  Patient tolerated treatment well       Past Medical History:  Diagnosis Date  . COPD (chronic obstructive pulmonary disease) (HCC)   . Emphysema lung (HCC)   . Hx of long term use of blood thinners   . Hypertension   . Stroke [facility]) 2013  . Thyroid disease    hypothyroidism    Past Surgical History:  Procedure Laterality Date  . IR ANGIO EXTRACRAN SEL COM CAROTID INNOMINATE UNI R MOD SED  06/19/2017  . IR ANGIO VERTEBRAL SEL SUBCLAVIAN INNOMINATE UNI R MOD SED  06/19/2017  . IR GASTROSTOMY TUBE MOD SED  07/04/2017  . RADIOLOGY WITH ANESTHESIA N/A 06/19/2017   Procedure: RADIOLOGY WITH ANESTHESIA;  Surgeon: Julieanne Cotton, MD;  [facility] OR;  Service: Radiology;  Laterality: N/A;    There were no vitals filed for this visit.  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  The patient received inpatient rehab services 06/23/2017 - 07/04/2017 and home health services on discharge.         Treatment Provided   Treatment provided  Cognitive-Linquistic       Pain Assessment   Pain Assessment  No/denies pain      Cognitive-Linquistic Treatment   Treatment focused on  Dysarthria;Apraxia;Voice;Other (comment);Patient/family/caregiver education Dysphagia    Skilled Treatment  BLOWING: Patient able to sustain blowing for a second longer.  PHONATION: Patient is able to generate a loud "ah" and was able to sustain X2 seconds 3X.  Task shifted to loudly repeating h+vowel, at which the patient is successful.   Loudly recite automatic speech series with 80% accuracy.  Patient able to imitate words/phrases, maintaining loudness for all words with 75% accuracy.  Name pictured objects, maintaining loudness, with 80% accuracy.        Assessment / Recommendations / Plan   Plan  Continue with current plan of care      Progression Toward Goals   Progression toward goals  Progressing toward goals       SLP Education - 08/31/17 1613    Education provided  Yes    Education Details  Be loud to exercise voice and swallowing muscles    Person(s) Educated  Patient    Methods  Explanation    Comprehension  Verbalized understanding         SLP Long Term Goals - 08/25/17 1328      SLP LONG TERM GOAL #1  Title  Pt will improve speech intelligibility for words and phrases by controlling rate of speech, over-articulation, and increased loudness to achieve 80% intelligibility with min. SLP cues.    Time  8    Period  Weeks    Status  On-going    Target Date  10/13/17      SLP LONG TERM GOAL #2   Title  Patient will execute Swallowing and Voice Building HEP 5-7 days per week, with assistance from family as needed.    Time  8    Period  Weeks    Status  On-going    Target Date  10/13/17      SLP LONG TERM GOAL #3   Title  Patient will tolerate least restrictive oral diet pending MBS, to be scheduled.    Time  8    Period  Weeks    Status  On-going    Target Date  10/13/17       Plan - 08/31/17 1614    Clinical Impression Statement  The patient  demonstrates improving respiration support and control as he learns how to do the exercises and improves vocal strength and endurance.  Will continue to address speech and swallowing via high effort/high intensity vocal exercises.    Speech Therapy Frequency  2x / week    Duration  Other (comment)    Treatment/Interventions  Pharyngeal strengthening exercises;Diet toleration management by SLP;Oral motor exercises;Compensatory strategies;SLP instruction and feedback;Patient/family education    Potential to Achieve Goals  Good    Potential Considerations  Ability to learn/carryover information;Pain level;Family/community support;Co-morbidities;Previous level of function;Cooperation/participation level;Severity of impairments;Medical prognosis    SLP Home Exercise Plan  Respiratory support activities, NPO swallowing and voice activities    Consulted and Agree with Plan of Care  Patient       Patient will benefit from skilled therapeutic intervention in order to improve the following deficits and impairments:   Dysarthria and anarthria  Oropharyngeal dysphagia    Problem List Patient Active Problem List   Diagnosis Date Noted  . PEG (percutaneous endoscopic gastrostomy) status (HCC)   . Benign essential HTN   . Hypertensive crisis   . Dysarthria, post-stroke   . Acute pain of right shoulder   . 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 Dysphagia, oropharyngeal 06/23/2017  . Respiratory failure (HCC)   . Palliative care encounter   . Aspiration pneumonia due to gastric secretions (HCC)   . Atrial fibrillation (HCC)   . 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 [place], [place], [address] Phone: [phone]   Fax:  [phone]   Name: Ronald [name] MRN: [id number] Date of Birth: [birth year]

## 2017-09-01 ENCOUNTER — Encounter: Payer: Self-pay | Admitting: Speech Pathology

## 2017-09-01 ENCOUNTER — Ambulatory Visit: Payer: Medicare Other | Admitting: Speech Pathology

## 2017-09-01 DIAGNOSIS — R278 Other lack of coordination: Secondary | ICD-10-CM | POA: Diagnosis not present

## 2017-09-01 DIAGNOSIS — R471 Dysarthria and anarthria: Secondary | ICD-10-CM

## 2017-09-01 DIAGNOSIS — M6281 Muscle weakness (generalized): Secondary | ICD-10-CM | POA: Diagnosis not present

## 2017-09-01 DIAGNOSIS — R1312 Dysphagia, oropharyngeal phase: Secondary | ICD-10-CM

## 2017-09-01 DIAGNOSIS — R2681 Unsteadiness on feet: Secondary | ICD-10-CM | POA: Diagnosis not present

## 2017-09-01 DIAGNOSIS — R4702 Dysphasia: Secondary | ICD-10-CM | POA: Diagnosis not present

## 2017-09-01 NOTE — Therapy (Signed)
[place] [facility] MAIN [facility] SERVICES [address] [place], [place], [address] Phone: [phone]   Fax:  [phone]  Speech Language Pathology Treatment  Patient Details  Name: Ronald [name] MRN: [id number] Date of Birth: 1940/07/28 Referring Provider: [name]   Encounter Date: 09/01/2017  End of Session - 09/01/17 1454    Visit Number  7    Number of Visits  17    Date for SLP Re-Evaluation  10/13/17    SLP Start Time  1400    SLP Stop Time   1450    SLP Time Calculation (min)  50 min    Activity Tolerance  Patient tolerated treatment well       Past Medical History:  Diagnosis Date  . COPD (chronic obstructive pulmonary disease) (HCC)   . Emphysema lung (HCC)   . Hx of long term use of blood thinners   . Hypertension   . Stroke [facility]) 2013  . Thyroid disease    hypothyroidism    Past Surgical History:  Procedure Laterality Date  . IR ANGIO EXTRACRAN SEL COM CAROTID INNOMINATE UNI R MOD SED  06/19/2017  . IR ANGIO VERTEBRAL SEL SUBCLAVIAN INNOMINATE UNI R MOD SED  06/19/2017  . IR GASTROSTOMY TUBE MOD SED  07/04/2017  . RADIOLOGY WITH ANESTHESIA N/A 06/19/2017   Procedure: RADIOLOGY WITH ANESTHESIA;  Surgeon: Julieanne Cotton, MD;  Location: [facility] OR;  Service: Radiology;  Laterality: N/A;    There were no vitals filed for this visit.  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  The patient received inpatient rehab services 06/23/2017 - 07/04/2017 and home health services on discharge.         Treatment Provided    Treatment provided  Cognitive-Linquistic;Dysphagia      Pain Assessment   Pain Assessment  No/denies pain      Cognitive-Linquistic Treatment   Treatment focused on  Dysarthria;Apraxia;Voice;Other (comment);Patient/family/caregiver education Dysphagia    Skilled Treatment  BLOWING: Patient able to sustain blowing for a second longer.  PHONATION: Patient is able to generate a loud "ah" and was able to sustain X2 seconds 3X.  Task shifted to loudly repeating h+vowel, at which the patient is successful.   Loudly recite automatic speech series with 80% accuracy.  Patient able to imitate words/phrases, maintaining loudness for all words with 75% accuracy.        Assessment / Recommendations / Plan   Plan  Continue with current plan of care      Progression Toward Goals   Progression toward goals  Progressing toward goals       SLP Education - 09/01/17 1454    Education provided  Yes    Education Details  Be loud to exercise voice and swallowing muscles    Person(s) Educated  Patient;Child(ren)    Methods  Explanation    Comprehension  Verbalized understanding         SLP Long Term Goals - 08/25/17 1328      SLP LONG TERM GOAL #1  Title  Pt will improve speech intelligibility for words and phrases by controlling rate of speech, over-articulation, and increased loudness to achieve 80% intelligibility with min. SLP cues.    Time  8    Period  Weeks    Status  On-going    Target Date  10/13/17      SLP LONG TERM GOAL #2   Title  Patient will execute Swallowing and Voice Building HEP 5-7 days per week, with assistance from family as needed.    Time  8    Period  Weeks    Status  On-going    Target Date  10/13/17      SLP LONG TERM GOAL #3   Title  Patient will tolerate least restrictive oral diet pending MBS, to be scheduled.    Time  8    Period  Weeks    Status  On-going    Target Date  10/13/17       Plan - 09/01/17 1455    Clinical Impression Statement  The patient  demonstrates improving respiration support and control as he learns how to do the exercises and improves vocal strength and endurance.  Will continue to address speech and swallowing via high effort/high intensity vocal exercises.    Speech Therapy Frequency  2x / week    Duration  Other (comment)    Treatment/Interventions  Pharyngeal strengthening exercises;Diet toleration management by SLP;Oral motor exercises;Compensatory strategies;SLP instruction and feedback;Patient/family education    Potential to Achieve Goals  Good    Potential Considerations  Ability to learn/carryover information;Pain level;Family/community support;Co-morbidities;Previous level of function;Cooperation/participation level;Severity of impairments;Medical prognosis    SLP Home Exercise Plan  Respiratory support activities, NPO swallowing and voice activities    Consulted and Agree with Plan of Care  Patient    Family Member Consulted  Daughter       Patient will benefit from skilled therapeutic intervention in order to improve the following deficits and impairments:   Dysarthria and anarthria  Oropharyngeal dysphagia    Problem List Patient Active Problem List   Diagnosis Date Noted  . PEG (percutaneous endoscopic gastrostomy) status (HCC)   . Benign essential HTN   . Hypertensive crisis   . Dysarthria, post-stroke   . Acute pain of right shoulder   . 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 Dysphagia, oropharyngeal 06/23/2017  . Respiratory failure (HCC)   . Palliative care encounter   . Aspiration pneumonia due to gastric secretions (HCC)   . Atrial fibrillation (HCC)   . 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 [place], [place], [address] Phone: [phone]   Fax:  [phone]   Name: Ronald [name] MRN: [id number] Date of Birth: 02/26/1941

## 2017-09-04 ENCOUNTER — Encounter: Payer: Medicare Other | Attending: Physical Medicine & Rehabilitation

## 2017-09-04 ENCOUNTER — Encounter: Payer: Self-pay | Admitting: Physical Medicine & Rehabilitation

## 2017-09-04 ENCOUNTER — Ambulatory Visit (HOSPITAL_BASED_OUTPATIENT_CLINIC_OR_DEPARTMENT_OTHER): Payer: Medicare Other | Admitting: Physical Medicine & Rehabilitation

## 2017-09-04 VITALS — BP 137/76 | HR 89 | Ht 70.0 in | Wt 166.8 lb

## 2017-09-04 DIAGNOSIS — E039 Hypothyroidism, unspecified: Secondary | ICD-10-CM | POA: Diagnosis not present

## 2017-09-04 DIAGNOSIS — I69398 Other sequelae of cerebral infarction: Secondary | ICD-10-CM

## 2017-09-04 DIAGNOSIS — R1312 Dysphagia, oropharyngeal phase: Secondary | ICD-10-CM

## 2017-09-04 DIAGNOSIS — Z7901 Long term (current) use of anticoagulants: Secondary | ICD-10-CM | POA: Insufficient documentation

## 2017-09-04 DIAGNOSIS — Z87891 Personal history of nicotine dependence: Secondary | ICD-10-CM | POA: Insufficient documentation

## 2017-09-04 DIAGNOSIS — I4891 Unspecified atrial fibrillation: Secondary | ICD-10-CM | POA: Diagnosis not present

## 2017-09-04 DIAGNOSIS — R269 Unspecified abnormalities of gait and mobility: Secondary | ICD-10-CM

## 2017-09-04 DIAGNOSIS — I639 Cerebral infarction, unspecified: Secondary | ICD-10-CM | POA: Diagnosis not present

## 2017-09-04 DIAGNOSIS — J449 Chronic obstructive pulmonary disease, unspecified: Secondary | ICD-10-CM | POA: Diagnosis not present

## 2017-09-04 DIAGNOSIS — Z09 Encounter for follow-up examination after completed treatment for conditions other than malignant neoplasm: Secondary | ICD-10-CM | POA: Diagnosis not present

## 2017-09-04 DIAGNOSIS — Z931 Gastrostomy status: Secondary | ICD-10-CM | POA: Insufficient documentation

## 2017-09-04 DIAGNOSIS — I10 Essential (primary) hypertension: Secondary | ICD-10-CM | POA: Insufficient documentation

## 2017-09-04 DIAGNOSIS — I69322 Dysarthria following cerebral infarction: Secondary | ICD-10-CM | POA: Insufficient documentation

## 2017-09-04 NOTE — Patient Instructions (Signed)
Reduce bolus feeds to 4 times a day

## 2017-09-04 NOTE — Progress Notes (Signed)
Subjective:    Patient ID: Ronald [name], male    DOB: Jul 21, 1940, 77 y.o.   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  Severe dysathria persists Patient is independent with self-care and mobility but still requires tube feeding.  His weight is up 166 pounds.  Premorbid weight was 170 pounds.  Current tube feeding is Jevity QID. No problems with tube.  Has repeat MBS this week.  Remains severely dysarthric with speech intelligibility of ~50% Pain Inventory Average Pain 10 Pain Right Now 1 My pain is intermittent and aching  In the last 24 hours, has pain interfered with the following? General activity 1 Relation with others 0 Enjoyment of life 0 What TIME of day is your pain at its worst? evening Sleep (in general) Fair  Pain is worse with: unsure Pain improves with: rest and medication Relief from Meds: 7  Mobility walk without assistance ability to climb steps?  yes do you drive?  no  Function disabled: date disabled . I need assistance with the following:  feeding, household duties and shopping  Neuro/Psych No problems in this area  Prior Studies Any changes since last visit?  no  Physicians involved in your care Any changes since last visit?  no   Family History  Problem Relation Age of Onset  . Parkinson's disease Mother   . Colon cancer Mother   . Emphysema Father   . Stroke Sister   . Stroke Brother    Social History   Socioeconomic History  . Marital status: Single    Spouse name: Not on file  . Number of children: Not on file  . Years of education: Not on file  . Highest education level: Not on file  Occupational History  . Not on file  Social Needs  . Financial resource strain: Not on file  . Food insecurity:    Worry: Not on file    Inability: Not on file  . Transportation needs:    Medical: Not on file   Non-medical: Not on file  Tobacco Use  . Smoking status: Former Games developer  . Smokeless tobacco: Former Engineer, water and Sexual Activity  . Alcohol use: Not Currently  . Drug use: No  . Sexual activity: Not on file  Lifestyle  . Physical activity:    Days per week: Not on file    Minutes per session: Not on file  . Stress: Not on file  Relationships  . Social connections:    Talks on phone: Not on file    Gets together: Not on file    Attends religious service: Not on file    Active member of club or organization: Not on file    Attends meetings of clubs or organizations: Not on file    Relationship status: Not on file  Other Topics Concern  . Not on file  Social History Narrative  . Not on file   Past Surgical History:  Procedure Laterality Date  . IR ANGIO EXTRACRAN SEL COM CAROTID INNOMINATE UNI R MOD SED  06/19/2017  . IR ANGIO VERTEBRAL SEL SUBCLAVIAN INNOMINATE UNI R MOD SED  06/19/2017  . IR GASTROSTOMY TUBE MOD SED  07/04/2017  . RADIOLOGY WITH ANESTHESIA N/A 06/19/2017   Procedure: RADIOLOGY WITH ANESTHESIA;  Surgeon: Julieanne Cotton, MD;  Location: [facility] OR;  Service: Radiology;  Laterality: N/A;   Past  Medical History:  Diagnosis Date  . COPD (chronic obstructive pulmonary disease) (HCC)   . Emphysema lung (HCC)   . Hx of long term use of blood thinners   . Hypertension   . Stroke [facility]) 2013  . Thyroid disease    hypothyroidism   There were no vitals taken for this visit.  Opioid Risk Score:   Fall Risk Score:  `1  Depression screen PHQ 2/9  No flowsheet data found.   Review of Systems  Constitutional: Negative.   HENT: Negative.   Eyes: Negative.   Respiratory: Negative.   Cardiovascular: Negative.   Gastrointestinal: Positive for diarrhea.  Endocrine: Negative.   Genitourinary: Negative.   Musculoskeletal: Negative.   Skin: Negative.   Allergic/Immunologic: Negative.   Neurological: Negative.   Hematological: Negative.     Psychiatric/Behavioral: Negative.   All other systems reviewed and are negative.      Objective:   Physical Exam  Constitutional: He appears well-developed and well-nourished.  HENT:  Head: Normocephalic and atraumatic.  Eyes: Pupils are equal, round, and reactive to light. EOM are normal.  Cardiovascular: Normal rate and regular rhythm.  No murmur heard. Pulmonary/Chest: Effort normal and breath sounds normal. No stridor. No respiratory distress. He has no wheezes.  Abdominal: Soft. Bowel sounds are normal. He exhibits no distension. There is no tenderness.  Musculoskeletal:  No pain with UE or LE ROM  Neurological:  Motor exam 5/5 Right delt Bi tri, grip, 5-/5 RIght HF, KE ADF 4/5 Left  Delt, bi, tri, grip, Left HF, KE, ADF  Sensation difficult to evaluate due to communication  Gait no AD, no teo drag or knee instability  Nursing note and vitals reviewed. PEG site CDI        Assessment & Plan:  1.  Right MCA infarct with Left hemiparesis, severe dysarthria and dysphagia Will repeat MBS to seeif trial feeds with SLP are safe  No PT, OT needed at this time  MD f/u 1 mo Given rapid wt gain would reduce TF to QID, if po intake increased after MBS may need to make further adjustments

## 2017-09-05 ENCOUNTER — Encounter: Payer: Self-pay | Admitting: Occupational Therapy

## 2017-09-05 ENCOUNTER — Ambulatory Visit: Payer: Medicare Other | Admitting: Occupational Therapy

## 2017-09-05 ENCOUNTER — Ambulatory Visit: Payer: Medicare Other | Admitting: Speech Pathology

## 2017-09-05 ENCOUNTER — Ambulatory Visit: Payer: Medicare Other

## 2017-09-05 ENCOUNTER — Encounter: Payer: Self-pay | Admitting: Speech Pathology

## 2017-09-05 ENCOUNTER — Other Ambulatory Visit: Payer: Self-pay

## 2017-09-05 VITALS — BP 169/88 | HR 110

## 2017-09-05 DIAGNOSIS — R2681 Unsteadiness on feet: Secondary | ICD-10-CM

## 2017-09-05 DIAGNOSIS — R471 Dysarthria and anarthria: Secondary | ICD-10-CM | POA: Diagnosis not present

## 2017-09-05 DIAGNOSIS — R278 Other lack of coordination: Secondary | ICD-10-CM | POA: Diagnosis not present

## 2017-09-05 DIAGNOSIS — M6281 Muscle weakness (generalized): Secondary | ICD-10-CM | POA: Diagnosis not present

## 2017-09-05 DIAGNOSIS — R1312 Dysphagia, oropharyngeal phase: Secondary | ICD-10-CM | POA: Diagnosis not present

## 2017-09-05 DIAGNOSIS — R4702 Dysphasia: Secondary | ICD-10-CM | POA: Diagnosis not present

## 2017-09-05 NOTE — Therapy (Deleted)
Weeks    Status  New    Target Date  11/28/17      OT LONG TERM GOAL #4   Title  Pt. will accurately identify 10/10 home safety hazards for ADLs, and IADLs    Baseline  EVal: Limited.    Time  12    Period  Weeks    Status  New    Target Date  11/28/17            Plan - 09/05/17 1821    Clinical Impression Statement  Pt. is a 77 y. o.  male who was diagnosed with a Right MC Infarct with Left Hemiparesis, and severe oropharyngeal Dysphagia, and severe Dysarthria. Pt. is NPO, and has a G tube  for feeding.  Pt. presents with Left grip strength, FMC skills, decreased activity tolerance, and visual impairments which limit his ability to complete ADL, and IADL tasks. No family was present for initial evaluation. Pt. could beneift from skilled OT services to improve left UE strength, coordination skills, and to provide pt./caregiver education about  visual compensatory strategies during ADLs, and IADLs.    Occupational Profile and client history currently impacting functional performance  Pt. resides with his daughter    Occupational performance deficits (Please refer to evaluation for details):  ADL's;IADL's    Rehab Potential  Poor    Current Impairments/barriers affecting progress:  Positive Indicators: age, family support, Negative Indicators: multiple comorbidities    OT Frequency  2x / week    OT Duration  12 weeks    OT Treatment/Interventions  Self-care/ADL training;Therapeutic exercise;Therapeutic activities;Neuromuscular education;DME and/or AE instruction;Patient/family education;Energy conservation    Clinical Decision Making  Several treatment options, min-mod task modification necessary    Consulted and Agree with Plan of Care  Patient       Patient will benefit from skilled therapeutic intervention in order to improve the following deficits and impairments:  Impaired UE functional use, Decreased activity tolerance, Decreased endurance, Decreased coordination, Impaired vision/preception, Decreased cognition, Decreased strength  Visit Diagnosis: Muscle weakness (generalized)    Problem List Patient Active Problem List   Diagnosis Date Noted  . PEG (percutaneous endoscopic gastrostomy) status (HCC)   . Benign essential HTN   . Hypertensive crisis   . Dysarthria, post-stroke   . Acute pain of right shoulder   . 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 Dysphagia, oropharyngeal 06/23/2017  . Respiratory failure (HCC)   . Palliative care encounter   . Aspiration  pneumonia due to gastric secretions (HCC)   . Atrial fibrillation (HCC)   . 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 will accurately identify 10/10 home safety hazards for ADLs, and IADLs    Baseline  EVal: Limited.    Time  12    Period  Weeks    Status  New    Target Date  11/28/17            Plan - 09/05/17 1821    Clinical Impression Statement  Pt. is a 77 y. o.  male who was diagnosed with a Right [facility] Infarct with Left Hemiparesis, and severe oropharyngeal Dysphagia, and severe Dysarthria. Pt. is NPO, and has a G tube  for feeding.  Pt. presents with Left grip strength, FMC skills, decreased activity tolerance, and visual impairments which limit his ability to complete ADL, and IADL tasks. No family was present for initial evaluation. Pt. could beneift from skilled OT services to improve left UE strength, coordination skills, and to provide pt./caregiver education about  visual compensatory strategies during ADLs, and IADLs.    Occupational Profile and client history currently impacting functional performance  Pt. resides with his daughter    Occupational performance deficits (Please refer to evaluation for details):  ADL's;IADL's    Rehab Potential  Poor    Current Impairments/barriers affecting progress:  Positive Indicators: age, family support, Negative Indicators: multiple comorbidities    OT Frequency  2x / week    OT Duration  12 weeks    OT Treatment/Interventions  Self-care/ADL training;Therapeutic exercise;Therapeutic activities;Neuromuscular education;DME and/or AE instruction;Patient/family education;Energy conservation    Clinical Decision Making  Several treatment options, min-mod task modification necessary    Consulted and Agree with Plan of Care  Patient       Patient will benefit from skilled therapeutic intervention in order to improve the following deficits and impairments:  Impaired UE functional use, Decreased activity tolerance, Decreased endurance, Decreased coordination, Impaired vision/preception, Decreased cognition, Decreased strength  Visit Diagnosis: Muscle weakness (generalized)    Problem List Patient Active Problem List   Diagnosis Date Noted  . PEG (percutaneous endoscopic gastrostomy) status (HCC)   . Benign essential HTN   . Hypertensive crisis   . Dysarthria, post-stroke   . Acute pain of right shoulder   . 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 Dysphagia, oropharyngeal 06/23/2017  . Respiratory failure (HCC)   . Palliative care encounter   . Aspiration  pneumonia due to gastric secretions (HCC)   . Atrial fibrillation (HCC)   . 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 [place], [place], [address] Phone: [phone]   Fax:  [phone]  Occupational Therapy Treatment  Patient Details  Name: Ronald [name] MRN: [id number] Date of Birth: 04-Nov-1940 Referring Provider: Dr. Wynn Banker   Encounter Date: 09/05/2017  OT End of Session - 09/05/17 1818    Visit Number  1    Number of Visits  24    Date for OT Re-Evaluation  11/28/17    Authorization Type  Visit 1 of 10 for progress report period starting 09/05/2017    OT Start Time  1500    OT Stop Time  1600    OT Time Calculation (min)  60 min    Activity Tolerance  Patient tolerated treatment well    Behavior During Therapy  [facility] for tasks assessed/performed       Past Medical History:  Diagnosis Date  . COPD (chronic obstructive pulmonary disease) (HCC)   . Emphysema lung (HCC)   . Hx of long term use of blood thinners   . Hypertension   . Stroke [facility]) 2013  . Thyroid disease    hypothyroidism    Past Surgical History:  Procedure Laterality Date  . IR ANGIO EXTRACRAN SEL COM CAROTID INNOMINATE UNI R MOD SED  06/19/2017  . IR ANGIO VERTEBRAL SEL SUBCLAVIAN INNOMINATE UNI R MOD SED  06/19/2017  . IR GASTROSTOMY TUBE MOD SED  07/04/2017  . RADIOLOGY WITH ANESTHESIA N/A 06/19/2017   Procedure: RADIOLOGY WITH ANESTHESIA;  Surgeon: Julieanne Cotton, MD;  Location: [facility] OR;  Service: Radiology;  Laterality: N/A;    There were no vitals filed for this visit.  Subjective Assessment - 09/05/17 1510    Subjective   Pt. attended the inital evaluation alone.    Patient is accompained by:  Family member    Pertinent History  Pt. is a 77 y.o. male who was admitted to [facility] with a Right MCA Infarct with Lft Hemiparesis, and severe oropharyngeal dysphagia, and severe dysarthria.    Currently in Pain?  No/denies    Pain Score  5     Pain Location  Back         [facility] OT Assessment - 09/05/17 1514      Assessment   Medical Diagnosis  CVA    Referring Provider  Dr. Wynn Banker    Onset Date/Surgical Date  06/20/17    Hand Dominance  Right      Precautions   Precautions  Fall    Precaution Comments  NPO      Restrictions   Weight Bearing Restrictions  No      Balance Screen   Has the patient fallen in the past 6 months  No    Has the patient had a decrease in activity level because of a fear of falling?   No    Is the patient reluctant to leave their home because of a fear of falling?   No      Home  Environment   Family/patient expects to be discharged to:  Private residence    Living Arrangements  Children    Available Help at Discharge  Family    Type of Home  Mobile home    Bathroom Shower/Tub  Tub/Shower unit    Shower/tub characteristics  Curtain    Home Equipment  Shower seat;Hand held shower head;Grab bars - tub/shower    Lives With  Daughter      Prior Function

## 2017-09-05 NOTE — Therapy (Signed)
[place] [facility] MAIN [facility] SERVICES [address] [place], [place], [address] Phone: [phone]   Fax:  [phone]  Physical Therapy Evaluation  Patient Details  Name: Ronald [name] MRN: [id number] Date of Birth: 05-13-1940 Referring Provider: Dr. Wynn Banker   Encounter Date: 09/05/2017  PT End of Session - 09/05/17 1607    Visit Number  1    Number of Visits  17    Date for PT Re-Evaluation  10/31/17    Authorization Type  progress note 1/10    PT Start Time  1605    PT Stop Time  1655    PT Time Calculation (min)  50 min    Equipment Utilized During Treatment  Gait belt    Activity Tolerance  Patient tolerated treatment well    Behavior During Therapy  [facility] for tasks assessed/performed       Past Medical History:  Diagnosis Date  . COPD (chronic obstructive pulmonary disease) (HCC)   . Emphysema lung (HCC)   . Hx of long term use of blood thinners   . Hypertension   . Stroke [facility]) 2013  . Thyroid disease    hypothyroidism    Past Surgical History:  Procedure Laterality Date  . IR ANGIO EXTRACRAN SEL COM CAROTID INNOMINATE UNI R MOD SED  06/19/2017  . IR ANGIO VERTEBRAL SEL SUBCLAVIAN INNOMINATE UNI R MOD SED  06/19/2017  . IR GASTROSTOMY TUBE MOD SED  07/04/2017  . RADIOLOGY WITH ANESTHESIA N/A 06/19/2017   Procedure: RADIOLOGY WITH ANESTHESIA;  Surgeon: Julieanne Cotton, MD;  Location: [facility] OR;  Service: Radiology;  Laterality: N/A;    Vitals:   09/05/17 1610  BP: (!) 169/88  Pulse: (!) 110  SpO2: 99%     Subjective Assessment - 09/05/17 1606    Subjective  CVA    Patient is accompained by:  Family member Son in Social worker    Pertinent History  Large portions of history borrowed from MD note. Son-in-law is with patient upon arrival but unable to accurately recount history. Pt is only able to contribute minimally to history. 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 COPD with tobacco abuse, who presented to the hospital on June 20, 2017, with left-sided weakness. CT scan negative for acute changes, old left posterior frontal infarction. The patient did receive tPA. INR on admission of 1.23. CT angiogram of head and neck showed apparent occlusion of right insular anterior temporal M2 branch. CT cerebral perfusion scan showed acute changes in the right MCA territory compatible with infarction. Echocardiogram with ejection fraction 60%. No wall motion abnormalities. Carotid arteriogram on June 19, 2017, showed no acute occlusions, AV shunting, or aneurysm. The patient did require short-term intubation. MRI showed a 3.2 cm acute hemorrhage within the right lateral temporal lobe. CT of the head showed evolving multifocal right MCA territory infarct with evolving focal hemorrhagic conversion in the right temporal lobe. Aspirin for CVA prophylaxis was initially on hold as well as Coumadin. He lived with male companion and was mobile and independent prior to this most recent CVA. After he left the hospital he discharged to daughter and son-in-law's mobile home.    Limitations  Walking    Currently in Pain?  No/denies         [facility] PT Assessment - 09/05/17 1627      Assessment   Medical Diagnosis  CVA  [place] [facility] MAIN [facility] SERVICES [address] [place], [place], [address] Phone: [phone]   Fax:  [phone]  Physical Therapy Evaluation  Patient Details  Name: Ronald [name] MRN: [id number] Date of Birth: 05-13-1940 Referring Provider: Dr. Wynn Banker   Encounter Date: 09/05/2017  PT End of Session - 09/05/17 1607    Visit Number  1    Number of Visits  17    Date for PT Re-Evaluation  10/31/17    Authorization Type  progress note 1/10    PT Start Time  1605    PT Stop Time  1655    PT Time Calculation (min)  50 min    Equipment Utilized During Treatment  Gait belt    Activity Tolerance  Patient tolerated treatment well    Behavior During Therapy  [facility] for tasks assessed/performed       Past Medical History:  Diagnosis Date  . COPD (chronic obstructive pulmonary disease) (HCC)   . Emphysema lung (HCC)   . Hx of long term use of blood thinners   . Hypertension   . Stroke [facility]) 2013  . Thyroid disease    hypothyroidism    Past Surgical History:  Procedure Laterality Date  . IR ANGIO EXTRACRAN SEL COM CAROTID INNOMINATE UNI R MOD SED  06/19/2017  . IR ANGIO VERTEBRAL SEL SUBCLAVIAN INNOMINATE UNI R MOD SED  06/19/2017  . IR GASTROSTOMY TUBE MOD SED  07/04/2017  . RADIOLOGY WITH ANESTHESIA N/A 06/19/2017   Procedure: RADIOLOGY WITH ANESTHESIA;  Surgeon: Julieanne Cotton, MD;  Location: [facility] OR;  Service: Radiology;  Laterality: N/A;    Vitals:   09/05/17 1610  BP: (!) 169/88  Pulse: (!) 110  SpO2: 99%     Subjective Assessment - 09/05/17 1606    Subjective  CVA    Patient is accompained by:  Family member Son in Social worker    Pertinent History  Large portions of history borrowed from MD note. Son-in-law is with patient upon arrival but unable to accurately recount history. Pt is only able to contribute minimally to history. 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 COPD with tobacco abuse, who presented to the hospital on June 20, 2017, with left-sided weakness. CT scan negative for acute changes, old left posterior frontal infarction. The patient did receive tPA. INR on admission of 1.23. CT angiogram of head and neck showed apparent occlusion of right insular anterior temporal M2 branch. CT cerebral perfusion scan showed acute changes in the right MCA territory compatible with infarction. Echocardiogram with ejection fraction 60%. No wall motion abnormalities. Carotid arteriogram on June 19, 2017, showed no acute occlusions, AV shunting, or aneurysm. The patient did require short-term intubation. MRI showed a 3.2 cm acute hemorrhage within the right lateral temporal lobe. CT of the head showed evolving multifocal right MCA territory infarct with evolving focal hemorrhagic conversion in the right temporal lobe. Aspirin for CVA prophylaxis was initially on hold as well as Coumadin. He lived with male companion and was mobile and independent prior to this most recent CVA. After he left the hospital he discharged to daughter and son-in-law's mobile home.    Limitations  Walking    Currently in Pain?  No/denies         [facility] PT Assessment - 09/05/17 1627      Assessment   Medical Diagnosis  CVA  Clinical Decision Making  Moderate    Rehab Potential  Good    PT Frequency  2x / week    PT Duration  8 weeks    PT Treatment/Interventions  ADLs/Self Care Home Management;Aquatic Therapy;Canalith Repostioning;Cryotherapy;Electrical Stimulation;Iontophoresis /ml Dexamethasone;DME Instruction;Moist Heat;Traction;Ultrasound;Gait training;Stair training;Therapeutic activities;Therapeutic exercise;Balance training;Neuromuscular re-education;Cognitive remediation;Patient/family education;Manual techniques;Passive range of motion;Energy conservation;Vestibular    PT Next Visit Plan  ?, HEP review, progress balance and strengthening    PT Home Exercise Plan  semitandem balance, sit to stand    Consulted and Agree with Plan of Care  Patient;Family member/caregiver    Family Member Consulted  Son-in-law       Patient will benefit  from skilled therapeutic intervention in order to improve the following deficits and impairments:  Decreased balance, Decreased strength, Difficulty walking, Decreased safety awareness  Visit Diagnosis: Muscle weakness (generalized) - Plan: PT plan of care cert/re-cert  Unsteadiness on feet - Plan: PT plan of care cert/re-cert     Problem List Patient Active Problem List   Diagnosis Date Noted  . PEG (percutaneous endoscopic gastrostomy) status (HCC)   . Benign essential HTN   . Hypertensive crisis   . Dysarthria, post-stroke   . Acute pain of right shoulder   . 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 Dysphagia, oropharyngeal 06/23/2017  . Respiratory failure (HCC)   . Palliative care encounter   . Aspiration pneumonia due to gastric secretions (HCC)   . Atrial fibrillation (HCC)   . 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 [place], [place], [address] Phone: [phone]   Fax:  [phone]  Name: Ronald [name] MRN: [id number] Date of Birth: 01/08/1941  Clinical Decision Making  Moderate    Rehab Potential  Good    PT Frequency  2x / week    PT Duration  8 weeks    PT Treatment/Interventions  ADLs/Self Care Home Management;Aquatic Therapy;Canalith Repostioning;Cryotherapy;Electrical Stimulation;Iontophoresis /ml Dexamethasone;DME Instruction;Moist Heat;Traction;Ultrasound;Gait training;Stair training;Therapeutic activities;Therapeutic exercise;Balance training;Neuromuscular re-education;Cognitive remediation;Patient/family education;Manual techniques;Passive range of motion;Energy conservation;Vestibular    PT Next Visit Plan  ?, HEP review, progress balance and strengthening    PT Home Exercise Plan  semitandem balance, sit to stand    Consulted and Agree with Plan of Care  Patient;Family member/caregiver    Family Member Consulted  Son-in-law       Patient will benefit  from skilled therapeutic intervention in order to improve the following deficits and impairments:  Decreased balance, Decreased strength, Difficulty walking, Decreased safety awareness  Visit Diagnosis: Muscle weakness (generalized) - Plan: PT plan of care cert/re-cert  Unsteadiness on feet - Plan: PT plan of care cert/re-cert     Problem List Patient Active Problem List   Diagnosis Date Noted  . PEG (percutaneous endoscopic gastrostomy) status (HCC)   . Benign essential HTN   . Hypertensive crisis   . Dysarthria, post-stroke   . Acute pain of right shoulder   . 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 Dysphagia, oropharyngeal 06/23/2017  . Respiratory failure (HCC)   . Palliative care encounter   . Aspiration pneumonia due to gastric secretions (HCC)   . Atrial fibrillation (HCC)   . 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 [place], [place], [address] Phone: [phone]   Fax:  [phone]  Name: Ronald [name] MRN: [id number] Date of Birth: 01/08/1941

## 2017-09-05 NOTE — Therapy (Signed)
Weeks    Status  New    Target Date  11/28/17      OT LONG TERM GOAL #4   Title  Pt. will accurately identify 10/10 home safety hazards for ADLs, and IADLs    Baseline  EVal: Limited.    Time  12    Period  Weeks    Status  New    Target Date  11/28/17            Plan - 09/05/17 1821    Clinical Impression Statement  Pt. is a 77 y. o.  male who was diagnosed with a Right MC Infarct with Left Hemiparesis, and severe oropharyngeal Dysphagia, and severe Dysarthria. Pt. is NPO, and has a G tube,  for feeding.  Pt. presents with Left grip strength, FMC skills, decreased activity tolerance, and visual impairments which limit his ability to complete ADL, and IADL tasks. No family was present for initial evaluation. Pt. could beneift from skilled OT services to improve left UE strength, coordination skills, and to provide pt./caregiver education about  visual compensatory strategies during ADLs, and IADLs.    Occupational Profile and client history currently impacting functional performance  Pt. resides with his daughter    Occupational performance deficits (Please refer to evaluation for details):  ADL's;IADL's    Rehab Potential  Poor    Current Impairments/barriers affecting progress:  Positive Indicators: age, family support, Negative Indicators: multiple comorbidities    OT Frequency  2x / week    OT Duration  12 weeks    OT Treatment/Interventions  Self-care/ADL training;Therapeutic exercise;Therapeutic activities;Neuromuscular education;DME and/or AE instruction;Patient/family education;Energy conservation    Clinical Decision Making  Several treatment options, min-mod task modification necessary    Consulted and Agree with Plan of Care  Patient       Patient will benefit from skilled therapeutic intervention in order to improve the following deficits and impairments:  Impaired UE functional use, Decreased activity tolerance, Decreased endurance, Decreased coordination, Impaired vision/preception, Decreased cognition, Decreased strength  Visit Diagnosis: Muscle weakness (generalized)    Problem List Patient Active Problem List   Diagnosis Date Noted  . PEG (percutaneous endoscopic gastrostomy) status (HCC)   . Benign essential HTN   . Hypertensive crisis   . Dysarthria, post-stroke   . Acute pain of right shoulder   . 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 Dysphagia, oropharyngeal 06/23/2017  . Respiratory failure (HCC)   . Palliative care encounter   . Aspiration  pneumonia due to gastric secretions (HCC)   . Atrial fibrillation (HCC)   . 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    Olegario Messier, MS, OTR/L 09/05/2017, 6:48 PM  [facility] MAIN [facility] SERVICES [address] [place], [place], [address] Phone: [phone]   Fax:  [phone]  Name: Ronald [name] MRN: [id number] Date of Birth: [birth year]  Weeks    Status  New    Target Date  11/28/17      OT LONG TERM GOAL #4   Title  Pt. will accurately identify 10/10 home safety hazards for ADLs, and IADLs    Baseline  EVal: Limited.    Time  12    Period  Weeks    Status  New    Target Date  11/28/17            Plan - 09/05/17 1821    Clinical Impression Statement  Pt. is a 77 y. o.  male who was diagnosed with a Right [facility] Infarct with Left Hemiparesis, and severe oropharyngeal Dysphagia, and severe Dysarthria. Pt. is NPO, and has a G tube,  for feeding.  Pt. presents with Left grip strength, FMC skills, decreased activity tolerance, and visual impairments which limit his ability to complete ADL, and IADL tasks. No family was present for initial evaluation. Pt. could beneift from skilled OT services to improve left UE strength, coordination skills, and to provide pt./caregiver education about  visual compensatory strategies during ADLs, and IADLs.    Occupational Profile and client history currently impacting functional performance  Pt. resides with his daughter    Occupational performance deficits (Please refer to evaluation for details):  ADL's;IADL's    Rehab Potential  Poor    Current Impairments/barriers affecting progress:  Positive Indicators: age, family support, Negative Indicators: multiple comorbidities    OT Frequency  2x / week    OT Duration  12 weeks    OT Treatment/Interventions  Self-care/ADL training;Therapeutic exercise;Therapeutic activities;Neuromuscular education;DME and/or AE instruction;Patient/family education;Energy conservation    Clinical Decision Making  Several treatment options, min-mod task modification necessary    Consulted and Agree with Plan of Care  Patient       Patient will benefit from skilled therapeutic intervention in order to improve the following deficits and impairments:  Impaired UE functional use, Decreased activity tolerance, Decreased endurance, Decreased coordination, Impaired vision/preception, Decreased cognition, Decreased strength  Visit Diagnosis: Muscle weakness (generalized)    Problem List Patient Active Problem List   Diagnosis Date Noted  . PEG (percutaneous endoscopic gastrostomy) status (HCC)   . Benign essential HTN   . Hypertensive crisis   . Dysarthria, post-stroke   . Acute pain of right shoulder   . 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 Dysphagia, oropharyngeal 06/23/2017  . Respiratory failure (HCC)   . Palliative care encounter   . Aspiration  pneumonia due to gastric secretions (HCC)   . Atrial fibrillation (HCC)   . 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    Olegario Messier, MS, OTR/L 09/05/2017, 6:48 PM  [facility] MAIN [facility] SERVICES [address] [place], [place], [address] Phone: [phone]   Fax:  [phone]  Name: Ronald [name] MRN: [id number] Date of Birth: [birth year]  [facility] MAIN [facility] SERVICES [address] [place], [place], [address] Phone: [phone]   Fax:  [phone]  Occupational Therapy Evaluation  Patient Details  Name: Ronald [name] MRN: [id number] Date of Birth: 10-Mar-1941 Referring Provider: Dr. Wynn Banker   Encounter Date: 09/05/2017  OT End of Session - 09/05/17 1818    Visit Number  1    Number of Visits  24    Date for OT Re-Evaluation  11/28/17    Authorization Type  Visit 1 of 10 for progress report period starting 09/05/2017    OT Start Time  1500    OT Stop Time  1600    OT Time Calculation (min)  60 min    Activity Tolerance  Patient tolerated treatment well    Behavior During Therapy  [facility] for tasks assessed/performed       Past Medical History:  Diagnosis Date  . COPD (chronic obstructive pulmonary disease) (HCC)   . Emphysema lung (HCC)   . Hx of long term use of blood thinners   . Hypertension   . Stroke [facility]) 2013  . Thyroid disease    hypothyroidism    Past Surgical History:  Procedure Laterality Date  . IR ANGIO EXTRACRAN SEL COM CAROTID INNOMINATE UNI R MOD SED  06/19/2017  . IR ANGIO VERTEBRAL SEL SUBCLAVIAN INNOMINATE UNI R MOD SED  06/19/2017  . IR GASTROSTOMY TUBE MOD SED  07/04/2017  . RADIOLOGY WITH ANESTHESIA N/A 06/19/2017   Procedure: RADIOLOGY WITH ANESTHESIA;  Surgeon: Julieanne Cotton, MD;  Location: [facility] OR;  Service: Radiology;  Laterality: N/A;    There were no vitals filed for this visit.  Subjective Assessment - 09/05/17 1510    Subjective   Pt. attended the inital evaluation alone.    Patient is accompained by:  Family member    Pertinent History  Pt. is a 77 y.o. male who was admitted to [facility] with a Right MCA Infarct with Lft Hemiparesis, and severe oropharyngeal dysphagia, and severe dysarthria.    Currently in Pain?  No/denies    Pain Score  5     Pain Location  Back        [facility] OT Assessment - 09/05/17 1514      Assessment   Medical Diagnosis  CVA    Referring Provider  Dr. Wynn Banker    Onset Date/Surgical Date  06/20/17    Hand Dominance  Right      Precautions   Precautions  Fall    Precaution Comments  NPO      Restrictions   Weight Bearing Restrictions  No      Balance Screen   Has the patient fallen in the past 6 months  No    Has the patient had a decrease in activity level because of a fear of falling?   No    Is the patient reluctant to leave their home because of a fear of falling?   No      Home  Environment   Family/patient expects to be discharged to:  Private residence    Living Arrangements  Children    Available Help at Discharge  Family    Type of Home  Mobile home    Bathroom Shower/Tub  Tub/Shower unit    Shower/tub characteristics  Curtain    Home Equipment  Shower seat;Hand held shower head;Grab bars - tub/shower    Lives With  Daughter      Prior Function

## 2017-09-05 NOTE — Therapy (Signed)
[facility] MAIN [facility] SERVICES [address] [place], [place], [address] Phone: [phone]   Fax:  [phone]  Speech Language Pathology Treatment  Patient Details  Name: Ronald [name] MRN: [id number] Date of Birth: 09-29-1940 Referring Provider: [name]   Encounter Date: 09/05/2017  End of Session - 09/05/17 1456    Visit Number  8    Number of Visits  17    Date for SLP Re-Evaluation  10/13/17    SLP Start Time  1400    SLP Stop Time   1450    SLP Time Calculation (min)  50 min    Activity Tolerance  Patient tolerated treatment well       Past Medical History:  Diagnosis Date  . COPD (chronic obstructive pulmonary disease) (HCC)   . Emphysema lung (HCC)   . Hx of long term use of blood thinners   . Hypertension   . Stroke [facility]) 2013  . Thyroid disease    hypothyroidism    Past Surgical History:  Procedure Laterality Date  . IR ANGIO EXTRACRAN SEL COM CAROTID INNOMINATE UNI R MOD SED  06/19/2017  . IR ANGIO VERTEBRAL SEL SUBCLAVIAN INNOMINATE UNI R MOD SED  06/19/2017  . IR GASTROSTOMY TUBE MOD SED  07/04/2017  . RADIOLOGY WITH ANESTHESIA N/A 06/19/2017   Procedure: RADIOLOGY WITH ANESTHESIA;  Surgeon: Julieanne Cotton, MD;  Location: [facility] OR;  Service: Radiology;  Laterality: N/A;    There were no vitals filed for this visit.  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  The patient received inpatient rehab services 06/23/2017 - 07/04/2017 and home health services on discharge.         Treatment Provided   Treatment provided   Cognitive-Linquistic      Pain Assessment   Pain Assessment  No/denies pain      Cognitive-Linquistic Treatment   Treatment focused on  Dysarthria;Apraxia;Voice;Other (comment);Patient/family/caregiver education Dysphagia    Skilled Treatment  BLOWING: Patient able to sustain blowing for a second longer.  PHONATION: Patient is able to generate a loud "ah" and was able to sustain X2 seconds 3X.  Task shifted to loudly repeating h+vowel, at which the patient is successful.   Loudly recite automatic speech series with 80% accuracy.  Patient able to imitate words/phrases, maintaining loudness for all words with 75% accuracy.        Assessment / Recommendations / Plan   Plan  Continue with current plan of care      Progression Toward Goals   Progression toward goals  Progressing toward goals       SLP Education - 09/05/17 1455    Education provided  Yes    Education Details  Be loud to exercises voice and swallowing muscles    Person(s) Educated  Patient    Methods  Explanation    Comprehension  Verbalized understanding         SLP Long Term Goals - 08/25/17 1328      SLP LONG TERM GOAL #1   Title  Pt will improve speech intelligibility  for words and phrases by controlling rate of speech, over-articulation, and increased loudness to achieve 80% intelligibility with min. SLP cues.    Time  8    Period  Weeks    Status  On-going    Target Date  10/13/17      SLP LONG TERM GOAL #2   Title  Patient will execute Swallowing and Voice Building HEP 5-7 days per week, with assistance from family as needed.    Time  8    Period  Weeks    Status  On-going    Target Date  10/13/17      SLP LONG TERM GOAL #3   Title  Patient will tolerate least restrictive oral diet pending MBS, to be scheduled.    Time  8    Period  Weeks    Status  On-going    Target Date  10/13/17       Plan - 09/05/17 1456    Clinical Impression Statement  The patient demonstrates improving respiration support  and control as he learns how to do the exercises and improves vocal strength and endurance.  Will continue to address speech and swallowing via high effort/high intensity vocal exercises.    Speech Therapy Frequency  2x / week    Duration  Other (comment)    Treatment/Interventions  Pharyngeal strengthening exercises;Diet toleration management by SLP;Oral motor exercises;Compensatory strategies;SLP instruction and feedback;Patient/family education    Potential to Achieve Goals  Good    Potential Considerations  Ability to learn/carryover information;Pain level;Family/community support;Co-morbidities;Previous level of function;Cooperation/participation level;Severity of impairments;Medical prognosis    SLP Home Exercise Plan  Respiratory support activities, NPO swallowing and voice activities    Consulted and Agree with Plan of Care  Patient       Patient will benefit from skilled therapeutic intervention in order to improve the following deficits and impairments:   Dysarthria and anarthria  Oropharyngeal dysphagia    Problem List Patient Active Problem List   Diagnosis Date Noted  . PEG (percutaneous endoscopic gastrostomy) status (HCC)   . Benign essential HTN   . Hypertensive crisis   . Dysarthria, post-stroke   . Acute pain of right shoulder   . 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 Dysphagia, oropharyngeal 06/23/2017  . Respiratory failure (HCC)   . Palliative care encounter   . Aspiration pneumonia due to gastric secretions (HCC)   . Atrial fibrillation (HCC)   . 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 [place], [place], [address] Phone: [phone]   Fax:  [phone]   Name: Ronald [name] MRN: [id number] Date of Birth: 1940/12/08

## 2017-09-06 ENCOUNTER — Telehealth: Payer: Self-pay | Admitting: *Deleted

## 2017-09-06 ENCOUNTER — Ambulatory Visit
Admission: RE | Admit: 2017-09-06 | Discharge: 2017-09-06 | Disposition: A | Discharge status: Home / Self Care | Payer: Medicare Other | Source: Ambulatory Visit | Attending: Physical Medicine & Rehabilitation | Admitting: Physical Medicine & Rehabilitation

## 2017-09-06 DIAGNOSIS — R1310 Dysphagia, unspecified: Secondary | ICD-10-CM | POA: Insufficient documentation

## 2017-09-06 DIAGNOSIS — R1312 Dysphagia, oropharyngeal phase: Secondary | ICD-10-CM

## 2017-09-06 DIAGNOSIS — I69322 Dysarthria following cerebral infarction: Secondary | ICD-10-CM | POA: Diagnosis not present

## 2017-09-06 NOTE — Therapy (Addendum)
Acute pain of right shoulder   . 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 Dysphagia, oropharyngeal 06/23/2017  . Respiratory failure (HCC)   . Palliative care encounter   . Aspiration pneumonia due to gastric secretions  (HCC)   . Atrial fibrillation (HCC)   . 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      Jerilynn Som, MS, CCC-SLP Watson,Katherine 09/06/2017, 5:28 PM  [facility] DIAGNOSTIC RADIOLOGY [address] [place], [place], [address] Phone: [phone]   Fax:     Name: Ronald [name] MRN: [id number] Date of Birth: [birth year]  [facility] DIAGNOSTIC RADIOLOGY [address] [place], [place], [address] Phone: [phone]   Fax:     Modified Barium Swallow  Patient Details  Name: Ronald [name] MRN: [id number] Date of Birth: August 10, 1940 Referring Provider: [name]   Encounter Date: 09/06/2017  End of Session - 09/06/17 1726    Visit Number  1    Number of Visits  1    Date for SLP Re-Evaluation  09/06/17    SLP Start Time  1330    SLP Stop Time   1445    SLP Time Calculation (min)  75 min    Activity Tolerance  Patient tolerated treatment well       Past Medical History:  Diagnosis Date  . COPD (chronic obstructive pulmonary disease) (HCC)   . Emphysema lung (HCC)   . Hx of long term use of blood thinners   . Hypertension   . Stroke [facility]) 2013  . Thyroid disease    hypothyroidism    Past Surgical History:  Procedure Laterality Date  . IR ANGIO EXTRACRAN SEL COM CAROTID INNOMINATE UNI R MOD SED  06/19/2017  . IR ANGIO VERTEBRAL SEL SUBCLAVIAN INNOMINATE UNI R MOD SED  06/19/2017  . IR GASTROSTOMY TUBE MOD SED  07/04/2017  . RADIOLOGY WITH ANESTHESIA N/A 06/19/2017   Procedure: RADIOLOGY WITH ANESTHESIA;  Surgeon: Julieanne Cotton, MD;  Location: [facility] OR;  Service: Radiology;  Laterality: N/A;    There were no vitals filed for this visit.      Subjective: Patient behavior: (alertness, ability to follow instructions, etc.): pt seen for a repeat MBSS; baseline dx of dysphagia w/ PEG tube. Limited study w/ limited bolus consistencies assessed d/t degree of oropharyngeal phase dysphagia. See history as per previous swallowing assessments; H&P. Chief complaint: dysphagia    Objective:  Radiological Procedure: A videoflouroscopic evaluation of oral-preparatory, reflex initiation, and pharyngeal phases of the swallow was performed; as well as a screening of the upper esophageal phase.  I. POSTURE: upright II. VIEW: lateral III. COMPENSATORY STRATEGIES:  lingual sweeping; f/u, DRY swallows; effortful swallowing; use of a Throat Clear post trials. Will most likely benefit from a alternating food and liquid boluses during oral intake IV. BOLUSES ADMINISTERED:  Thin Liquid: NT  Nectar-thick Liquid: NT  Honey-thick Liquid: 5 tsps  Puree: 4 tsps (1/2 size)  Mechanical Soft: NT V. RESULTS OF EVALUATION: A. ORAL PREPARATORY PHASE: (The lips, tongue, and velum are observed for strength and coordination)       **Overall Severity Rating: MODERATE+. During the oral phase, Slow, deliberate bolus manipulation and A-P transfer noted w/ trials of the puree and honey consistency liquids by TSP. Bolus transfer and clearing were inefficient but given time, he was able to manipulate the majority of the bolus material posteriorly for swallowing. Oral residue remained w/ verbal/tactile cues given for using a f/u swallow to aid clearing. Bolus trial material spilled prematurely into the pharynx.   B. SWALLOW INITIATION/REFLEX: (The reflex is normal if "triggered" by the time the bolus reached the base of the tongue)  **Overall Severity Rating: MODERATE. Delayed pharyngeal swallow initiation noted w/ the trial consistencies assessed; spillage into the pharynx: valleculae w/ puree, pyriform sinuses w/ Honey liquids by TSP. Reduced airway closure and epiglottic inversion noted during the swallowing.   C. PHARYNGEAL PHASE: (Pharyngeal function is normal if the bolus shows rapid, smooth, and continuous transit through the pharynx and there is no pharyngeal residue after the swallow)  **Overall  [facility] DIAGNOSTIC RADIOLOGY [address] [place], [place], [address] Phone: [phone]   Fax:     Modified Barium Swallow  Patient Details  Name: Ronald [name] MRN: [id number] Date of Birth: August 10, 1940 Referring Provider: [name]   Encounter Date: 09/06/2017  End of Session - 09/06/17 1726    Visit Number  1    Number of Visits  1    Date for SLP Re-Evaluation  09/06/17    SLP Start Time  1330    SLP Stop Time   1445    SLP Time Calculation (min)  75 min    Activity Tolerance  Patient tolerated treatment well       Past Medical History:  Diagnosis Date  . COPD (chronic obstructive pulmonary disease) (HCC)   . Emphysema lung (HCC)   . Hx of long term use of blood thinners   . Hypertension   . Stroke [facility]) 2013  . Thyroid disease    hypothyroidism    Past Surgical History:  Procedure Laterality Date  . IR ANGIO EXTRACRAN SEL COM CAROTID INNOMINATE UNI R MOD SED  06/19/2017  . IR ANGIO VERTEBRAL SEL SUBCLAVIAN INNOMINATE UNI R MOD SED  06/19/2017  . IR GASTROSTOMY TUBE MOD SED  07/04/2017  . RADIOLOGY WITH ANESTHESIA N/A 06/19/2017   Procedure: RADIOLOGY WITH ANESTHESIA;  Surgeon: Julieanne Cotton, MD;  Location: [facility] OR;  Service: Radiology;  Laterality: N/A;    There were no vitals filed for this visit.      Subjective: Patient behavior: (alertness, ability to follow instructions, etc.): pt seen for a repeat MBSS; baseline dx of dysphagia w/ PEG tube. Limited study w/ limited bolus consistencies assessed d/t degree of oropharyngeal phase dysphagia. See history as per previous swallowing assessments; H&P. Chief complaint: dysphagia    Objective:  Radiological Procedure: A videoflouroscopic evaluation of oral-preparatory, reflex initiation, and pharyngeal phases of the swallow was performed; as well as a screening of the upper esophageal phase.  I. POSTURE: upright II. VIEW: lateral III. COMPENSATORY STRATEGIES:  lingual sweeping; f/u, DRY swallows; effortful swallowing; use of a Throat Clear post trials. Will most likely benefit from a alternating food and liquid boluses during oral intake IV. BOLUSES ADMINISTERED:  Thin Liquid: NT  Nectar-thick Liquid: NT  Honey-thick Liquid: 5 tsps  Puree: 4 tsps (1/2 size)  Mechanical Soft: NT V. RESULTS OF EVALUATION: A. ORAL PREPARATORY PHASE: (The lips, tongue, and velum are observed for strength and coordination)       **Overall Severity Rating: MODERATE+. During the oral phase, Slow, deliberate bolus manipulation and A-P transfer noted w/ trials of the puree and honey consistency liquids by TSP. Bolus transfer and clearing were inefficient but given time, he was able to manipulate the majority of the bolus material posteriorly for swallowing. Oral residue remained w/ verbal/tactile cues given for using a f/u swallow to aid clearing. Bolus trial material spilled prematurely into the pharynx.   B. SWALLOW INITIATION/REFLEX: (The reflex is normal if "triggered" by the time the bolus reached the base of the tongue)  **Overall Severity Rating: MODERATE. Delayed pharyngeal swallow initiation noted w/ the trial consistencies assessed; spillage into the pharynx: valleculae w/ puree, pyriform sinuses w/ Honey liquids by TSP. Reduced airway closure and epiglottic inversion noted during the swallowing.   C. PHARYNGEAL PHASE: (Pharyngeal function is normal if the bolus shows rapid, smooth, and continuous transit through the pharynx and there is no pharyngeal residue after the swallow)  **Overall

## 2017-09-07 ENCOUNTER — Telehealth: Payer: Self-pay

## 2017-09-07 ENCOUNTER — Encounter: Payer: Self-pay | Admitting: Neurology

## 2017-09-07 ENCOUNTER — Ambulatory Visit (INDEPENDENT_AMBULATORY_CARE_PROVIDER_SITE_OTHER): Payer: Medicare Other | Admitting: Neurology

## 2017-09-07 VITALS — BP 124/74 | HR 75 | Ht 70.0 in | Wt 166.0 lb

## 2017-09-07 DIAGNOSIS — I63411 Cerebral infarction due to embolism of right middle cerebral artery: Secondary | ICD-10-CM | POA: Diagnosis not present

## 2017-09-07 DIAGNOSIS — I639 Cerebral infarction, unspecified: Secondary | ICD-10-CM

## 2017-09-07 NOTE — Telephone Encounter (Signed)
Pt called stating that the Speech Therapist asked her to call the office to let ask if her note can be read and to advice Debbie on pt oral intake.

## 2017-09-07 NOTE — Telephone Encounter (Signed)
400cc of Tube feed 4 times a day

## 2017-09-07 NOTE — Patient Instructions (Signed)
I had a long d/w patient and his daughter about his recent embolic stroke,atruial fibrillation, risk for recurrent stroke/TIAs, personally independently reviewed imaging studies and stroke evaluation results and answered questions.Continue warfarin daily  for secondary stroke prevention and maintain strict control of hypertension with blood pressure goal below 130/90, diabetes with hemoglobin A1c goal below 6.5% and lipids with LDL cholesterol goal below 70 mg/dL. I also advised the patient to eat a healthy diet with plenty of whole grains, cereals, fruits and vegetables, exercise regularly and maintain ideal body weight.  Continue ongoing outpatient physical, occupational and speech therapy.  I recommend he do regular neck stretching exercises for his posterior headache and neck pain.  Followup in the future with my nurse practitioner Shanda Bumps in 3 months or call earlier if necessary.   Stroke Prevention Some medical conditions and behaviors are associated with a higher chance of having a stroke. You can help prevent a stroke by making nutrition, lifestyle, and other changes, including managing any medical conditions you may have. What nutrition changes can be made?  Eat healthy foods. You can do this by: ? Choosing foods high in fiber, such as fresh fruits and vegetables and whole grains. ? Eating at least 5 or more servings of fruits and vegetables a day. Try to fill half of your plate at each meal with fruits and vegetables. ? Choosing lean protein foods, such as lean cuts of meat, poultry without skin, fish, tofu, beans, and nuts. ? Eating low-fat dairy products. ? Avoiding foods that are high in salt (sodium). This can help lower blood pressure. ? Avoiding foods that have saturated fat, trans fat, and cholesterol. This can help prevent high cholesterol. ? Avoiding processed and premade foods.  Follow your health care provider's specific guidelines for losing weight, controlling high blood  pressure (hypertension), lowering high cholesterol, and managing diabetes. These may include: ? Reducing your daily calorie intake. ? Limiting your daily sodium intake to 1,500 milligrams (mg). ? Using only healthy fats for cooking, such as olive oil, canola oil, or sunflower oil. ? Counting your daily carbohydrate intake. What lifestyle changes can be made?  Maintain a healthy weight. Talk to your health care provider about your ideal weight.  Get at least 30 minutes of moderate physical activity at least 5 days a week. Moderate activity includes brisk walking, biking, and swimming.  Do not use any products that contain nicotine or tobacco, such as cigarettes and e-cigarettes. If you need help quitting, ask your health care provider. It may also be helpful to avoid exposure to secondhand smoke.  Limit alcohol intake to no more than 1 drink a day for nonpregnant women and 2 drinks a day for men. One drink equals 12 oz of beer, 5 oz of wine, or 1 oz of hard liquor.  Stop any illegal drug use.  Avoid taking birth control pills. Talk to your health care provider about the risks of taking birth control pills if: ? You are over 21 years old. ? You smoke. ? You get migraines. ? You have ever had a blood clot. What other changes can be made?  Manage your cholesterol levels. ? Eating a healthy diet is important for preventing high cholesterol. If cholesterol cannot be managed through diet alone, you may also need to take medicines. ? Take any prescribed medicines to control your cholesterol as told by your health care provider.  Manage your diabetes. ? Eating a healthy diet and exercising regularly are important parts of managing your  blood sugar. If your blood sugar cannot be managed through diet and exercise, you may need to take medicines. ? Take any prescribed medicines to control your diabetes as told by your health care provider.  Control your hypertension. ? To reduce your risk of  stroke, try to keep your blood pressure below 130/80. ? Eating a healthy diet and exercising regularly are an important part of controlling your blood pressure. If your blood pressure cannot be managed through diet and exercise, you may need to take medicines. ? Take any prescribed medicines to control hypertension as told by your health care provider. ? Ask your health care provider if you should monitor your blood pressure at home. ? Have your blood pressure checked every year, even if your blood pressure is normal. Blood pressure increases with age and some medical conditions.  Get evaluated for sleep disorders (sleep apnea). Talk to your health care provider about getting a sleep evaluation if you snore a lot or have excessive sleepiness.  Take over-the-counter and prescription medicines only as told by your health care provider. Aspirin or blood thinners (antiplatelets or anticoagulants) may be recommended to reduce your risk of forming blood clots that can lead to stroke.  Make sure that any other medical conditions you have, such as atrial fibrillation or atherosclerosis, are managed. What are the warning signs of a stroke? The warning signs of a stroke can be easily remembered as BEFAST.  B is for balance. Signs include: ? Dizziness. ? Loss of balance or coordination. ? Sudden trouble walking.  E is for eyes. Signs include: ? A sudden change in vision. ? Trouble seeing.  F is for face. Signs include: ? Sudden weakness or numbness of the face. ? The face or eyelid drooping to one side.  A is for arms. Signs include: ? Sudden weakness or numbness of the arm, usually on one side of the body.  S is for speech. Signs include: ? Trouble speaking (aphasia). ? Trouble understanding.  T is for time. ? These symptoms may represent a serious problem that is an emergency. Do not wait to see if the symptoms will go away. Get medical help right away. Call your local emergency services  (911 in the [place] Do not drive yourself to the hospital.  Other signs of stroke may include: ? A sudden, severe headache with no known cause. ? Nausea or vomiting. ? Seizure.  Where to find more information: For more information, visit:  American Stroke Association: www.strokeassociation.org  National Stroke Association: www.stroke.org  Summary  You can prevent a stroke by eating healthy, exercising, not smoking, limiting alcohol intake, and managing any medical conditions you may have.  Do not use any products that contain nicotine or tobacco, such as cigarettes and e-cigarettes. If you need help quitting, ask your health care provider. It may also be helpful to avoid exposure to secondhand smoke.  Remember BEFAST for warning signs of stroke. Get help right away if you or a loved one has any of these signs. This information is not intended to replace advice given to you by your health care provider. Make sure you discuss any questions you have with your health care provider. Document Released: 05/26/2004 Document Revised: 05/24/2016 Document Reviewed: 05/24/2016 Elsevier Interactive Patient Education  [employer].

## 2017-09-07 NOTE — Progress Notes (Signed)
[facility] [address] [place]. [place] [address] [phone]       OFFICE FOLLOW-UP NOTE  Ronald [name] Date of Birth:  [birth year] Medical Record Number:  [id number]   HPI: Ronald [name] is a 77 year old Caucasian male seen today for initial office follow-up visit following hospital admission for stroke in February 2019.  He is accompanied by his daughter.  History is obtained from them and review of electronic medical records.  I have personally reviewed imaging films.RonaldRonald L Burnsis a 77 y.o.malewith PMH of AFIB on Coumadin with subtherapeutic INR, Hx of CVA, HTN and Hypothyroidism who presents with acute left-sided weakness and questionable M2 occlusion with improvement following IV TPA. Due to patients persistent deficits an angiogram was done to assess if there was truly an LVO and he was taken for this which was negative.  He was admitted to the intensive care unit and follow-up imaging showed hemorrhagic conversion in the right temporal lobe with mild mass-effect but no midline shift.  His blood pressure was tightly controlled he was neurologically monitored remained stable.  He had significant dysphagia and dysarthria.  He was seen by physical occupational and speech therapist.  He was initially started on aspirin due to hemorrhagic transformation and transferred to inpatient rehab and subsequently has been switched to warfarin now.  He still has significant dysarthria and dysphagia and has a PEG tube for feeding.  He is undergoing outpatient therapies.  He had modified barium swallow done yesterday and has been cleared for honey thick liquids.  His INR has been fluctuating but last INR was 2.1 earlier this week.  He is able to ambulate independently but has some balance issues but has had no falls or injuries.  He complains of occipital headache as well as pain shooting down his left shoulder.  He does have a history of degenerative cervical spine disease and was not  considered a surgical candidate in the past.  He does take some Tylenol which seems to help.  He is presently living at home with his daughter.     ROS:   14 system review of systems is positive for hearing loss, trouble swallowing, cough, diarrhea, headache, slurred speech, difficulty swallowing, joint pain, feeling cold, diarrhea, insomnia and all other systems negative PMH:  Past Medical History:  Diagnosis Date  . COPD (chronic obstructive pulmonary disease) (HCC)   . Emphysema lung (HCC)   . Hx of long term use of blood thinners   . Hypertension   . Stroke [facility]) 2013  . Thyroid disease    hypothyroidism    Social History:  Social History   Socioeconomic History  . Marital status: Single    Spouse name: Not on file  . Number of children: Not on file  . Years of education: Not on file  . Highest education level: Not on file  Occupational History  . Not on file  Social Needs  . Financial resource strain: Not on file  . Food insecurity:    Worry: Not on file    Inability: Not on file  . Transportation needs:    Medical: Not on file    Non-medical: Not on file  Tobacco Use  . Smoking status: Former Smoker    Last attempt to quit: 06/05/2017    Years since quitting: 0.2  . Smokeless tobacco: Former Engineer, water and Sexual Activity  . Alcohol use: Not Currently  . Drug use: No  . Sexual activity: Not on file  Lifestyle  . Physical activity:    Days per week: Not on file    Minutes per session: Not on file  . Stress: Not on file  Relationships  . Social connections:    Talks on phone: Not on file    Gets together: Not on file    Attends religious service: Not on file    Active member of club or organization: Not on file    Attends meetings of clubs or organizations: Not on file    Relationship status: Not on file  . Intimate partner violence:    Fear of current or ex partner: Not on file    Emotionally abused: Not on file    Physically abused: Not on file     Forced sexual activity: Not on file  Other Topics Concern  . Not on file  Social History Narrative  . Not on file    Medications:   Current Outpatient Medications on File Prior to Visit  Medication Sig Dispense Refill  . atorvastatin (LIPITOR) 40 MG tablet Place 1 tablet (40 mg total) into feeding tube daily at 6 PM. 30 tablet 1  . diclofenac sodium (VOLTAREN) 1 % GEL Apply 2 g topically 4 (four) times daily. 1 Tube 1  . famotidine (PEPCID AC) 10 MG chewable tablet Place 10 mg into feeding tube 2 (two) times daily.     Marland Kitchen levothyroxine (SYNTHROID, LEVOTHROID) 125 MCG tablet Place 1 tablet (125 mcg total) into feeding tube daily before breakfast. 30 tablet 1  . magic mouthwash SOLN Take 5 mLs by mouth 4 (four) times daily. To tongue 240 mL 1  . Metoprolol Succinate 50 MG CS24 Take by mouth.    . Nutritional Supplements (FEEDING SUPPLEMENT, JEVITY 1.5 CAL/FIBER,) LIQD Place 300 mLs into feeding tube 4 (four) times daily. (Patient taking differently: Place 450 mLs into feeding tube 4 (four) times daily. ) 300 mL 1  . warfarin (COUMADIN) 5 MG tablet Take 1 tablet (5 mg total) by mouth daily after supper. (Patient taking differently: Place 5 mg into feeding tube daily after supper. 5mg  mon-fri and 2.5mg  sat and sun) 30 tablet 2  . Water For Irrigation, Sterile (FREE WATER) SOLN Place 200 mLs into feeding tube 4 (four) times daily.     No current facility-administered medications on file prior to visit.     Allergies:  No Known Allergies  Physical Exam General: Frail elderly Caucasian male seated, in no evident distress.  He has a PEG tube in his abdomen Head: head normocephalic and atraumatic.  Neck: supple with no carotid or supraclavicular bruits Cardiovascular: regular rate and rhythm, no murmurs Musculoskeletal: Mild kyphosis Skin:  no rash/petichiae Vascular:  Normal pulses all extremities Lungs bilateral crackles in lower lung fields Vitals:   09/07/17 1438  BP: 124/74    Pulse: 75   Neurologic Exam Mental Status: Awake and fully alert. Oriented to place and time. Recent and remote memory intact. Attention span, concentration and fund of knowledge appropriate. Mood and affect appropriate.  Severe dysarthria and can be understood with some difficulty.  Jaw jerk is quite brisk. Cranial Nerves: Fundoscopic exam reveals sharp disc margins. Pupils equal, briskly reactive to light. Extraocular movements full without nystagmus. Visual fields full to confrontation. Hearing intact. Facial sensation intact.  Moderate left lower facial weakness including weakness of eye closure., tongue, palate moves normally and symmetrically.  Motor: Normal bulk and tone. Normal strength in all tested extremity muscles.  Except mild left grip weakness.  Diminished  fine finger movements on the left.  Orbits right over left upper extremity. Sensory.: intact to touch ,pinprick .position and vibratory sensation.  Coordination: Rapid alternating movements normal in all extremities. Finger-to-nose and heel-to-shin performed accurately bilaterally. Gait and Station: Arises from chair without difficulty. Stance is normal. Gait demonstrates normal stride length and balance . Able to heel, toe and tandem walk with moderate difficulty.  Reflexes: 1+ and asymmetric and brisker on the left side.. Toes downgoing.   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  He has significant residual dysarthria and dysphagia.  Vascular risk factors of hyperlipidemia and atrial fibrillation.    PLAN: I had a long d/w patient and his daughter about his recent embolic stroke,atruial fibrillation, risk for recurrent stroke/TIAs, personally independently reviewed imaging studies and stroke evaluation results and answered questions.Continue warfarin  daily  for secondary stroke prevention and maintain strict control of hypertension with blood pressure goal below 130/90, diabetes with hemoglobin A1c goal below 6.5% and lipids with LDL cholesterol goal below 70 mg/dL. I also advised the patient to eat a healthy diet with plenty of whole grains, cereals, fruits and vegetables, exercise regularly and maintain ideal body weight.  Continue ongoing outpatient physical, occupational and speech therapy.  I recommend he do regular neck stretching exercises for his posterior headache and neck pain.  Followup in the future with my nurse practitioner Shanda Bumps in 3 months or call earlier if necessary. Greater than 50% of time during this 25 minute visit was spent on counseling,explanation of diagnosis of embolic stroke, atrial fibrillation, planning of further management, discussion with patient and family and coordination of care Delia Heady, MD  [facility] [address] [place], [place] [address]  Phone [phone] Fax [phone] Note: This document was prepared with digital dictation and possible smart phrase technology. Any transcriptional errors that result from this process are unintentional

## 2017-09-08 NOTE — Telephone Encounter (Signed)
May start with oral feeding with SLP Pureed with Honey If family can be trained with this they will need to record intake so we can accordingly cut back futher on TF

## 2017-09-08 NOTE — Telephone Encounter (Signed)
The speech therapist wants a provider review of the Modified Barium Swallow and would like advice on proceeding with oral intake. Patient did well with Thick-it.  They say the patient is currently 400cc tube feed QID

## 2017-09-12 ENCOUNTER — Encounter: Payer: Self-pay | Admitting: Occupational Therapy

## 2017-09-12 ENCOUNTER — Ambulatory Visit: Payer: Medicare Other | Admitting: Speech Pathology

## 2017-09-12 ENCOUNTER — Encounter: Payer: Self-pay | Admitting: Speech Pathology

## 2017-09-12 ENCOUNTER — Ambulatory Visit: Payer: Medicare Other | Admitting: Occupational Therapy

## 2017-09-12 DIAGNOSIS — R471 Dysarthria and anarthria: Secondary | ICD-10-CM

## 2017-09-12 DIAGNOSIS — R278 Other lack of coordination: Secondary | ICD-10-CM | POA: Diagnosis not present

## 2017-09-12 DIAGNOSIS — M6281 Muscle weakness (generalized): Secondary | ICD-10-CM

## 2017-09-12 DIAGNOSIS — R2681 Unsteadiness on feet: Secondary | ICD-10-CM | POA: Diagnosis not present

## 2017-09-12 DIAGNOSIS — R4702 Dysphasia: Secondary | ICD-10-CM | POA: Diagnosis not present

## 2017-09-12 DIAGNOSIS — R1312 Dysphagia, oropharyngeal phase: Secondary | ICD-10-CM

## 2017-09-12 NOTE — Therapy (Signed)
[facility] MAIN [facility] SERVICES [address] [place], [place], [address] Phone: [phone]   Fax:  [phone]  Occupational Therapy Treatment  Patient Details  Name: Ronald [name] MRN: [id number] Date of Birth: May 20, 1940 Referring Provider: Dr. Wynn Banker   Encounter Date: 09/12/2017  OT End of Session - 09/12/17 1539    Visit Number  2    Number of Visits  24    Date for OT Re-Evaluation  11/28/17    Authorization Type  Visit 2 of 10 for progress report period starting 09/05/2017    OT Start Time  1505    OT Stop Time  1545    OT Time Calculation (min)  40 min    Activity Tolerance  Patient tolerated treatment well    Behavior During Therapy  [facility] for tasks assessed/performed       Past Medical History:  Diagnosis Date  . COPD (chronic obstructive pulmonary disease) (HCC)   . Emphysema lung (HCC)   . Hx of long term use of blood thinners   . Hypertension   . Stroke [facility]) 2013  . Thyroid disease    hypothyroidism    Past Surgical History:  Procedure Laterality Date  . IR ANGIO EXTRACRAN SEL COM CAROTID INNOMINATE UNI R MOD SED  06/19/2017  . IR ANGIO VERTEBRAL SEL SUBCLAVIAN INNOMINATE UNI R MOD SED  06/19/2017  . IR GASTROSTOMY TUBE MOD SED  07/04/2017  . RADIOLOGY WITH ANESTHESIA N/A 06/19/2017   Procedure: RADIOLOGY WITH ANESTHESIA;  Surgeon: Julieanne Cotton, MD;  Location: [facility] OR;  Service: Radiology;  Laterality: N/A;    There were no vitals filed for this visit.  Subjective Assessment - 09/12/17 1537    Subjective   Pt. continues to present with severe expressive communication deficits.    Patient is accompained by:  Family member    Pertinent History  Pt. is a 77 y.o. male who was admitted to [facility] with a Right MCA Infarct with Lft Hemiparesis, and severe oropharyngeal dysphagia, and severe dysarthria.    Currently in Pain?  No/denies      OT TREATMENT    Neuro muscular re-education:  Pt. worked on grasping one  inch resistive cubes alternating thumb opposition to the tip of the 2nd digit. The board was positioned at a vertical angle. Pt. Worked on pressing them back into place while isolating the 2nd digit. Pt. required extensive cues for motor planning, movement patterns, and hand position. Pt. performed [facility] tasks using the Grooved pegboard. Pt. worked on grasping the grooved pegs from a horizontal position, and moving the pegs to a vertical position in the hand to prepare for placing them in the grooved slot.   Therapeutic Exercise:  Pt. performed gross gripping with grip strengthener. Pt. worked on sustaining grip while grasping pegs and reaching at various heights. Gripper was placed in the 3rd resistive slot with the white resistive spring. Pt. Worked on pinch strengthening in the left hand for lateral, and 3pt. pinch using yellow, red, green, and blue resistive clips. Pt. worked on placing the clips at various vertical and horizontal angles. Tactile and verbal cues were required for eliciting the desired movement.                          OT Education - 09/12/17 1539    Education provided  No          OT Long Term Goals - 09/05/17 1834  OT LONG TERM GOAL #1   Title  Pt. will demonstrate independence with visual compensatory strategies during ADL, and IADL tasks.    Baseline  Eval: Visual impairment present. Vision to be further assessed to determine how it may be affecting ADL/IADL functioning.    Time  12    Period  Weeks    Status  New    Target Date  11/28/17      OT LONG TERM GOAL #2   Title  Pt. will increase Left grip strength will increase by 10# to improve ADL/IADL functioning.    Baseline  Eval: Limited LUE grip strength    Time  12    Period  Weeks    Status  New    Target Date  11/28/17      OT LONG TERM GOAL #3   Title  Pt. will improve FMC skills by 5 sec. of speed to be able to manipulate ADL/IADL objects.    Baseline  Eval: Limited left hand  coordination skills.    Time  12    Period  Weeks    Status  New    Target Date  11/28/17      OT LONG TERM GOAL #4   Title  Pt. will accurately identify 10/10 home safety hazards for ADLs, and IADLs    Baseline  EVal: Limited.    Time  12    Period  Weeks    Status  New    Target Date  11/28/17            Plan - 09/12/17 1540    Clinical Impression Statement  Pt. continues to present with impulsivity, impaired motor planning, motor control, and coordination skills. Pt. continues to work on improving UE strength, and [facility] skills for improved ADL, and IADL functioning. Pt. requires constant tactile cues for motor planning, hand position, and movement patterns during each task. task. Pt. continues to work on UE functioning for improved functional hand use during ADL, and IADL tasks.    Occupational Profile and client history currently impacting functional performance  Pt. resides with his daughter    Occupational performance deficits (Please refer to evaluation for details):  ADL's;IADL's    Rehab Potential  Poor    Current Impairments/barriers affecting progress:  Positive Indicators: age, family support, Negative Indicators: multiple comorbidities    OT Frequency  2x / week    OT Duration  12 weeks    OT Treatment/Interventions  Self-care/ADL training;Therapeutic exercise;Therapeutic activities;Neuromuscular education;DME and/or AE instruction;Patient/family education;Energy conservation    Clinical Decision Making  Several treatment options, min-mod task modification necessary    Consulted and Agree with Plan of Care  Patient       Patient will benefit from skilled therapeutic intervention in order to improve the following deficits and impairments:  Impaired UE functional use, Decreased activity tolerance, Decreased endurance, Decreased coordination, Impaired vision/preception, Decreased cognition, Decreased strength  Visit Diagnosis: Muscle weakness (generalized)  Other lack  of coordination    Problem List Patient Active Problem List   Diagnosis Date Noted  . PEG (percutaneous endoscopic gastrostomy) status (HCC)   . Benign essential HTN   . Hypertensive crisis   . Dysarthria, post-stroke   . Acute pain of right shoulder   . 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 Dysphagia, oropharyngeal 06/23/2017  . Respiratory failure (HCC)   . Palliative care encounter   . Aspiration pneumonia due to gastric secretions (HCC)   . Atrial fibrillation (HCC)   .  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    Olegario Messier, MS, OTR/L 09/12/2017, 6:16 PM  [facility] MAIN [facility] SERVICES [address] [place], [place], [address] Phone: [phone]   Fax:  [phone]  Name: Ronald [name] MRN: [id number] Date of Birth: Dec 26, 1940

## 2017-09-12 NOTE — Telephone Encounter (Signed)
I left message with information for Ronald [name] SLP @ [facility] re[facility] in [place] , and notified Debbie.  They see Darl Pikes today.

## 2017-09-12 NOTE — Therapy (Signed)
SLP Long Term Goals - 08/25/17 1328      SLP LONG TERM GOAL #1   Title  Pt will improve speech intelligibility for words and phrases by controlling rate of speech, over-articulation, and increased loudness to achieve 80% intelligibility with min. SLP cues.    Time  8    Period  Weeks    Status  On-going    Target Date  10/13/17      SLP LONG TERM GOAL #2   Title  Patient will execute Swallowing and Voice Building HEP 5-7 days per week, with assistance from family as needed.    Time  8    Period  Weeks    Status  On-going    Target Date  10/13/17      SLP LONG TERM GOAL #3   Title  Patient will tolerate least restrictive oral diet pending MBS, to be scheduled.    Time  8    Period  Weeks     Status  On-going    Target Date  10/13/17       Plan - 09/12/17 1658    Clinical Impression Statement  The patient is able to take 4 ounces of apple sauce with no overt problems.  He has consistent cough/throat clear with honey-thick liquid.  The patient and his daughter have the oral intake log and while bring the log to therapy.  The patient demonstrates improving respiration support and control as he learns how to do the exercises and improves vocal strength and endurance.  Will continue to address speech and swallowing via high effort/high intensity vocal exercises.    Speech Therapy Frequency  2x / week    Duration  Other (comment)    Treatment/Interventions  Pharyngeal strengthening exercises;Diet toleration management by SLP;Oral motor exercises;Compensatory strategies;SLP instruction and feedback;Patient/family education    Potential to Achieve Goals  Good    Potential Considerations  Ability to learn/carryover information;Pain level;Family/community support;Co-morbidities;Previous level of function;Cooperation/participation level;Severity of impairments;Medical prognosis    SLP Home Exercise Plan  Respiratory support activities, voice activities, oral intake log, swallowing recommendations, and things to observe for.      Consulted and Agree with Plan of Care  Patient;Family member/caregiver    Family Member Consulted  Daughter       Patient will benefit from skilled therapeutic intervention in order to improve the following deficits and impairments:   Dysarthria and anarthria  Oropharyngeal dysphagia    Problem List Patient Active Problem List   Diagnosis Date Noted  . PEG (percutaneous endoscopic gastrostomy) status (HCC)   . Benign essential HTN   . Hypertensive crisis   . Dysarthria, post-stroke   . Acute pain of right shoulder   . 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 Dysphagia, oropharyngeal 06/23/2017  . Respiratory failure (HCC)   . Palliative care  encounter   . Aspiration pneumonia due to gastric secretions (HCC)   . Atrial fibrillation (HCC)   . 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   Ronald Primrose, [name] SLP  [name] 09/12/2017, 5:00 PM  [facility] MAIN [facility] SERVICES [address] [place], [place], [address] Phone: [phone]   Fax:  [phone]   Name: Ronald [name] MRN: [id number] Date of Birth: [birth year]  [facility] MAIN [facility] SERVICES [address] [place], [place], [address] Phone: [phone]   Fax:  [phone]  Speech Language Pathology Treatment  Patient Details  Name: MARCANTHONY [name] MRN: [id number] Date of Birth: 07/16/1940 Referring Provider: [name]   Encounter Date: 09/12/2017  End of Session - 09/12/17 1657    Visit Number  9    Number of Visits  17    Date for SLP Re-Evaluation  10/13/17    SLP Start Time  1545    SLP Stop Time   1640    SLP Time Calculation (min)  55 min    Activity Tolerance  Patient tolerated treatment well       Past Medical History:  Diagnosis Date  . COPD (chronic obstructive pulmonary disease) (HCC)   . Emphysema lung (HCC)   . Hx of long term use of blood thinners   . Hypertension   . Stroke [facility]) 2013  . Thyroid disease    hypothyroidism    Past Surgical History:  Procedure Laterality Date  . IR ANGIO EXTRACRAN SEL COM CAROTID INNOMINATE UNI R MOD SED  06/19/2017  . IR ANGIO VERTEBRAL SEL SUBCLAVIAN INNOMINATE UNI R MOD SED  06/19/2017  . IR GASTROSTOMY TUBE MOD SED  07/04/2017  . RADIOLOGY WITH ANESTHESIA N/A 06/19/2017   Procedure: RADIOLOGY WITH ANESTHESIA;  Surgeon: Julieanne Cotton, MD;  Location: [facility] OR;  Service: Radiology;  Laterality: N/A;    There were no vitals filed for this visit.  Subjective Assessment - 09/12/17 1655    Subjective  Patient is eager to improve swallowing    Patient is accompained by:  Family member            ADULT SLP TREATMENT - 09/12/17 0001      General Information   Behavior/Cognition  Alert;Cooperative;Pleasant mood;Distractible    HPI  77 year old man, with history of prior stroke with speech and language deficits, with right hemisphere CVA 06/19/2017 with profound dysarthria and severe oropharyngeal dysphagia.  The patient received inpatient rehab services 06/23/2017 - 07/04/2017 and home health services on discharge.         Treatment  Provided   Treatment provided  Cognitive-Linquistic      Pain Assessment   Pain Assessment  No/denies pain      Cognitive-Linquistic Treatment   Treatment focused on  Dysarthria;Apraxia;Voice;Other (comment);Patient/family/caregiver education Dysphagia    Skilled Treatment  SWALLOWING:   Patient and daughter were given oral intake log, swallowing recommendations, and things to observe for.  Today, the patient took 4 ounces of applesauce with no overt difficulties.  He had 6  tsp honey-thick liquid, with cough or throat clear each swallow.  PHONATION: Patient is able to generate a loud h+vowel.   Loudly recite automatic speech series with 100% accuracy.  Patient able to imitate words/phrases, maintaining loudness for all words with 75% accuracy.        Assessment / Recommendations / Plan   Plan  Continue with current plan of care      Progression Toward Goals   Progression toward goals  Progressing toward goals       SLP Education - 09/12/17 1656    Education provided  Yes    Education Details  oral intake log, swallowing recommendations, things to look for    Person(s) Educated  Patient;Child(ren)    Methods  Explanation;Handout    Comprehension  Verbalized understanding

## 2017-09-14 ENCOUNTER — Encounter: Payer: Self-pay | Admitting: Occupational Therapy

## 2017-09-14 ENCOUNTER — Ambulatory Visit: Payer: Medicare Other | Admitting: Speech Pathology

## 2017-09-14 ENCOUNTER — Ambulatory Visit: Payer: Medicare Other | Admitting: Occupational Therapy

## 2017-09-14 ENCOUNTER — Encounter: Payer: Self-pay | Admitting: Speech Pathology

## 2017-09-14 DIAGNOSIS — M6281 Muscle weakness (generalized): Secondary | ICD-10-CM | POA: Diagnosis not present

## 2017-09-14 DIAGNOSIS — R1312 Dysphagia, oropharyngeal phase: Secondary | ICD-10-CM

## 2017-09-14 DIAGNOSIS — R471 Dysarthria and anarthria: Secondary | ICD-10-CM

## 2017-09-14 DIAGNOSIS — R278 Other lack of coordination: Secondary | ICD-10-CM

## 2017-09-14 DIAGNOSIS — R2681 Unsteadiness on feet: Secondary | ICD-10-CM | POA: Diagnosis not present

## 2017-09-14 DIAGNOSIS — R4702 Dysphasia: Secondary | ICD-10-CM | POA: Diagnosis not present

## 2017-09-14 NOTE — Therapy (Signed)
[place] [facility] MAIN [facility] SERVICES [address] [place], [place], [address] Phone: [phone]   Fax:  [phone]  Speech Language Pathology Treatment/Progress Note   Speech Therapy Progress Note   Dates of reporting period  08/11/2017   to   09/14/2017   Patient Details  Name: Ronald [name] MRN: [id number] Date of Birth: [birth year] Referring Provider: [name]   Encounter Date: 09/14/2017  End of Session - 09/14/17 1657    Visit Number  10    Number of Visits  17    Date for SLP Re-Evaluation  10/13/17    SLP Start Time  1600    SLP Stop Time   1645    SLP Time Calculation (min)  45 min    Activity Tolerance  Patient tolerated treatment well       Past Medical History:  Diagnosis Date  . COPD (chronic obstructive pulmonary disease) (HCC)   . Emphysema lung (HCC)   . Hx of long term use of blood thinners   . Hypertension   . Stroke [facility]) 2013  . Thyroid disease    hypothyroidism    Past Surgical History:  Procedure Laterality Date  . IR ANGIO EXTRACRAN SEL COM CAROTID INNOMINATE UNI R MOD SED  06/19/2017  . IR ANGIO VERTEBRAL SEL SUBCLAVIAN INNOMINATE UNI R MOD SED  06/19/2017  . IR GASTROSTOMY TUBE MOD SED  07/04/2017  . RADIOLOGY WITH ANESTHESIA N/A 06/19/2017   Procedure: RADIOLOGY WITH ANESTHESIA;  Surgeon: Julieanne Cotton, MD;  Location: [facility] OR;  Service: Radiology;  Laterality: N/A;    There were no vitals filed for this visit.  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  The patient received  inpatient rehab services 06/23/2017 - 07/04/2017 and home health services on discharge.         Treatment Provided   Treatment provided  Cognitive-Linquistic      Pain Assessment   Pain Assessment  No/denies pain      Cognitive-Linquistic Treatment   Treatment focused on  Dysarthria;Apraxia;Voice;Other (comment);Patient/family/caregiver education Dysphagia    Skilled Treatment  SWALLOWING:   Patient returned with completed log.  He has taken approximately 6 ounces of mashed potatoes X2 days. Today, the patient took 4 ounces of applesauce with no overt difficulties and maintaining strong and clear vocal quality between bites.  He had 6  tsp honey-thick liquid, with cough or throat clear with 4/6 trials.  PHONATION: Patient is able to generate a loud h+vowel.   Loudly recite automatic speech series with 100% accuracy.  Patient able to imitate words/phrases, maintaining loudness for all words with 85% accuracy.        Assessment / Recommendations / Plan   Plan  Continue with current plan of care      Progression Toward Goals   Progression toward goals  Progressing toward goals       SLP Education - 09/14/17 1657    Education provided  Yes    Education Details  swallowing precautions  Person(s) Educated  Patient;Child(ren)    Methods  Explanation    Comprehension  Verbalized understanding         SLP Long Term Goals - 09/14/17 1700      SLP LONG TERM GOAL #1   Title  Pt will improve speech intelligibility for words and phrases by controlling rate of speech, over-articulation, and increased loudness to achieve 80% intelligibility with min. SLP cues.    Status  Partially Met      SLP LONG TERM GOAL #2   Title  Patient will execute Swallowing and Voice Building HEP 5-7 days per week, with assistance from family as needed.    Status  On-going      SLP LONG TERM GOAL #3   Title  Patient will tolerate least restrictive oral diet pending MBS, to be scheduled.    Status  Revised       SLP LONG TERM GOAL #4   Title  Patient / family will track oral intake and report any notable observations    Status  New    Target Date  10/03/17       Plan - 09/14/17 1658    Clinical Impression Statement  The patient is able to take 4 ounces of apple sauce with no overt problems.  He had less cough/throat clear with honey-thick liquid.  The patient's daughter is competent to oversee his home oral intake.  She returned his log, noting important information.  At this point, he is taking approximately 6 ounces of pureed solid with her per day.  The patient demonstrates improving respiration support and control as he learns how to do the exercises and improves vocal strength and endurance.  Will continue to address speech and swallowing via trial POs and high effort/high intensity vocal exercises.    Speech Therapy Frequency  2x / week    Duration  Other (comment)    Treatment/Interventions  Pharyngeal strengthening exercises;Diet toleration management by SLP;Oral motor exercises;Compensatory strategies;SLP instruction and feedback;Patient/family education    Potential to Achieve Goals  Good    Potential Considerations  Ability to learn/carryover information;Pain level;Family/community support;Co-morbidities;Previous level of function;Cooperation/participation level;Severity of impairments;Medical prognosis    SLP Home Exercise Plan  Respiratory support activities, voice activities, oral intake log, swallowing recommendations, and things to observe for.      Consulted and Agree with Plan of Care  Patient;Family member/caregiver    Family Member Consulted  Daughter       Patient will benefit from skilled therapeutic intervention in order to improve the following deficits and impairments:   Dysarthria and anarthria  Oropharyngeal dysphagia    Problem List Patient Active Problem List   Diagnosis Date Noted  . PEG (percutaneous endoscopic gastrostomy) status (HCC)   . Benign essential HTN    . Hypertensive crisis   . Dysarthria, post-stroke   . Acute pain of right shoulder   . 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 Dysphagia, oropharyngeal 06/23/2017  . Respiratory failure (HCC)   . Palliative care encounter   . Aspiration pneumonia due to gastric secretions (HCC)   . Atrial fibrillation (HCC)   . 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 [place], [place], [address] Phone: [phone]   Fax:  [phone]   Name: ETHAN MCKINNEY MRN: [id number] Date of Birth: 10/12/1940

## 2017-09-14 NOTE — Therapy (Signed)
Phone: [phone]   Fax:  [phone]  Name: SAHAS SLUKA MRN: [id number] Date of Birth: 1940/11/19  Phone: [phone]   Fax:  [phone]  Name: SAHAS SLUKA MRN: [id number] Date of Birth: 1940/11/19  [place] MAIN [facility] SERVICES [address] [place], [place], [address] Phone: [phone]   Fax:  [phone]  Occupational Therapy Treatment  Patient Details  Name: JHALIL SILVERA MRN: [id number] Date of Birth: 1940/12/17 Referring Provider: Dr. Wynn Banker   Encounter Date: 09/14/2017  OT End of Session - 09/14/17 1522    Visit Number  3    Number of Visits  24    Date for OT Re-Evaluation  11/28/17    Authorization Type  Visit 3 of 10 for progress report period starting 09/05/2017    OT Start Time  1515    OT Stop Time  1600    OT Time Calculation (min)  45 min    Activity Tolerance  Patient tolerated treatment well    Behavior During Therapy  [facility] for tasks assessed/performed       Past Medical History:  Diagnosis Date  . COPD (chronic obstructive pulmonary disease) (HCC)   . Emphysema lung (HCC)   . Hx of long term use of blood thinners   . Hypertension   . Stroke [facility]) 2013  . Thyroid disease    hypothyroidism    Past Surgical History:  Procedure Laterality Date  . IR ANGIO EXTRACRAN SEL COM CAROTID INNOMINATE UNI R MOD SED  06/19/2017  . IR ANGIO VERTEBRAL SEL SUBCLAVIAN INNOMINATE UNI R MOD SED  06/19/2017  . IR GASTROSTOMY TUBE MOD SED  07/04/2017  . RADIOLOGY WITH ANESTHESIA N/A 06/19/2017   Procedure: RADIOLOGY WITH ANESTHESIA;  Surgeon: Julieanne Cotton, MD;  Location: [facility] OR;  Service: Radiology;  Laterality: N/A;    There were no vitals filed for this visit.  Subjective Assessment - 09/14/17 1521    Subjective   Pt. continues to present with severe expressive communication deficits.    Patient is accompained by:  Family member    Pertinent History  Pt. is a 77 y.o. male who was admitted to [facility] with a Right MCA Infarct with Lft Hemiparesis, and severe oropharyngeal dysphagia, and severe dysarthria.    Currently in Pain?  No/denies       OT TREATMENT    Neuro muscular re-education:  Pt. worked on grasping 1"  resistive cubes alternating thumb opposition to the tip of the 2nd digit while the board is placed at a vertical angle. Pt. worked on pressing the cubes back into place while alternating isolated 2nd digit extension. Pt. Worked on grasping coins from a tabletop surface, placing them into a resistive container, and pushing them through the slot while isolating his 2nd digit. Pt. Required visual demonstration, and verbal cues.  Therapeutic Exercise:  Pt. performed gross gripping with grip strengthener. Pt. worked on sustaining left hand grip while grasping pegs and reaching at various heights. Gripper was placed in the 3rd resistive slot with the white resistive spring. Pt. Worked on pinch strengthening in the left hand for lateral, and 3pt. pinch using yellow, red, green, and blue resistive clips. Pt. worked on placing the clips at various vertical and horizontal angles. Tactile and verbal cues were required for eliciting the desired movement.                         OT Education - 09/14/17 1522    Education provided  No          OT Long Term Goals - 09/05/17 1834      OT LONG TERM GOAL #1   Title  Pt.

## 2017-09-18 NOTE — Patient Outreach (Signed)
mRS was successfully completed during office visit with Dr. Pearlean Brownie.. mRS = 3

## 2017-09-19 ENCOUNTER — Ambulatory Visit: Payer: Medicare Other

## 2017-09-19 ENCOUNTER — Ambulatory Visit: Payer: Medicare Other | Admitting: Occupational Therapy

## 2017-09-19 ENCOUNTER — Ambulatory Visit: Payer: Medicare Other | Admitting: Speech Pathology

## 2017-09-19 ENCOUNTER — Encounter: Payer: Self-pay | Admitting: Occupational Therapy

## 2017-09-19 VITALS — BP 145/68 | HR 82

## 2017-09-19 DIAGNOSIS — R278 Other lack of coordination: Secondary | ICD-10-CM | POA: Diagnosis not present

## 2017-09-19 DIAGNOSIS — R471 Dysarthria and anarthria: Secondary | ICD-10-CM | POA: Diagnosis not present

## 2017-09-19 DIAGNOSIS — R4702 Dysphasia: Secondary | ICD-10-CM

## 2017-09-19 DIAGNOSIS — R2681 Unsteadiness on feet: Secondary | ICD-10-CM | POA: Diagnosis not present

## 2017-09-19 DIAGNOSIS — M6281 Muscle weakness (generalized): Secondary | ICD-10-CM

## 2017-09-19 DIAGNOSIS — R1312 Dysphagia, oropharyngeal phase: Secondary | ICD-10-CM | POA: Diagnosis not present

## 2017-09-19 NOTE — Therapy (Signed)
[place] [facility] MAIN [facility] SERVICES [address] [place], [place], [address] Phone: [phone]   Fax:  [phone]  Physical Therapy Treatment  Patient Details  Name: Ronald [name] MRN: [id number] Date of Birth: 1940-10-01 Referring Provider: Dr. Wynn Banker   Encounter Date: 09/19/2017  PT End of Session - 09/20/17 2226    Visit Number  2    Number of Visits  17    Date for PT Re-Evaluation  10/31/17    Authorization Type  progress note 2/10    PT Start Time  1600    PT Stop Time  1645    PT Time Calculation (min)  45 min    Equipment Utilized During Treatment  Gait belt    Activity Tolerance  Patient tolerated treatment well    Behavior During Therapy  [facility] for tasks assessed/performed       Past Medical History:  Diagnosis Date  . COPD (chronic obstructive pulmonary disease) (HCC)   . Emphysema lung (HCC)   . Hx of long term use of blood thinners   . Hypertension   . Stroke [facility]) 2013  . Thyroid disease    hypothyroidism    Past Surgical History:  Procedure Laterality Date  . IR ANGIO EXTRACRAN SEL COM CAROTID INNOMINATE UNI R MOD SED  06/19/2017  . IR ANGIO VERTEBRAL SEL SUBCLAVIAN INNOMINATE UNI R MOD SED  06/19/2017  . IR GASTROSTOMY TUBE MOD SED  07/04/2017  . RADIOLOGY WITH ANESTHESIA N/A 06/19/2017   Procedure: RADIOLOGY WITH ANESTHESIA;  Surgeon: Julieanne Cotton, MD;  Location: [facility] OR;  Service: Radiology;  Laterality: N/A;    Vitals:   09/19/17 1604  BP: (!) 145/68  Pulse: 82  SpO2: 100%    Subjective Assessment - 09/19/17 1601    Subjective  Pt reports that he is doing well today. Denies pain. No specific questions or concerns currently.     Patient is accompained by:  Family member Son in Social worker    Pertinent History  Large portions of history borrowed from MD note. Son-in-law is with patient upon arrival but unable to accurately recount history. Pt is only able to contribute minimally to history. 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 COPD with tobacco abuse, who presented to the hospital on June 20, 2017, with left-sided weakness. CT scan negative for acute changes, old left posterior frontal infarction. The patient did receive tPA. INR on admission of 1.23. CT angiogram of head and neck showed apparent occlusion of right insular anterior temporal M2 branch. CT cerebral perfusion scan showed acute changes in the right MCA territory compatible with infarction. Echocardiogram with ejection fraction 60%. No wall motion abnormalities. Carotid arteriogram on June 19, 2017, showed no acute occlusions, AV shunting, or aneurysm. The patient did require short-term intubation. MRI showed a 3.2 cm acute hemorrhage within the right lateral temporal lobe. CT of the head showed evolving multifocal right MCA territory infarct with evolving focal hemorrhagic conversion in the right temporal lobe. Aspirin for CVA prophylaxis was initially on hold as well as Coumadin. He lived with male companion and was mobile and independent prior to this most recent CVA. After he left the hospital he discharged to daughter and son-in-law's mobile home.    Limitations  Walking    Currently in Pain?  No/denies           TREATMENT  Ther-ex  Waylan Boga  fitness interval training with redirection and cues, L8 x 30s, L4 x 1 minute, 3 cycles of each with 1 minute warm-up and 1 minute cool down; Quantum leg press 135# 2 x 15; Sit to stand without UE support from regular height chair 2 x 10;  Neuromuscular Re-education  Modified tandem balance alternating forward LE, pt unable to follow commands and demonstration to perform correctly and maintain; Feet together balance on Airex with feet together, pt unable to close his L eye on command so balance performed with eyes open 1 min x 2; Airex balance with  toe taps to 6" step, considerable verbal and tactile cues as well as demonstration; Step-ups to 6" step with Airex pad on top from Airex x 10 leading with each LE;                  PT Education - 09/20/17 2226    Education provided  Yes    Education Details  exercise form/technique    Person(s) Educated  Patient    Methods  Explanation    Comprehension  Verbalized understanding       PT Short Term Goals - 09/06/17 2053      PT SHORT TERM GOAL #1   Title  Pt will be independent with HEP in order to improve strength and balance in order to decrease fall risk and improve function at home and work.    Time  4    Period  Weeks    Status  New    Target Date  10/04/17        PT Long Term Goals - 09/06/17 2054      PT LONG TERM GOAL #1   Title  Pt will improve BERG by at least 3 points in order to demonstrate clinically significant improvement in balance.    Baseline  09/05/17: 51/56    Time  8    Period  Weeks    Status  New    Target Date  10/31/17      PT LONG TERM GOAL #2   Title  Pt will decrease 5TSTS by at least 3 seconds in order to demonstrate clinically significant improvement in LE strength    Baseline  09/05/17: 25.8s    Time  8    Period  Weeks    Status  New    Target Date  10/31/17      PT LONG TERM GOAL #3   Title  Pt will increase self-selected 2m gait speed by at least 0.13 m/s in order to demonstrate clinically significant improvement in community ambulation.     Baseline  09/05/17: 0.88 m/s    Time  8    Period  Weeks    Status  New    Target Date  10/31/17            Plan - 09/20/17 2227    Clinical Impression Statement  Pt struggles following commands during session and demonstrates significant apraxia with movements. He requires constant verbal and tactile cues as well as demonstration to perform limited exercises. Pt encouraged to continue HEP and follow-up as scheduled.     Rehab Potential  Good    PT Frequency  2x / week    PT  Duration  8 weeks    PT Treatment/Interventions  ADLs/Self Care Home Management;Aquatic Therapy;Canalith Repostioning;Cryotherapy;Electrical Stimulation;Iontophoresis 4mg /ml Dexamethasone;DME Instruction;Moist Heat;Traction;Ultrasound;Gait training;Stair training;Therapeutic activities;Therapeutic exercise;Balance training;Neuromuscular re-education;Cognitive remediation;Patient/family education;Manual techniques;Passive range of motion;Energy conservation;Vestibular    PT Next Visit Plan  HEP review, progress balance and strengthening    PT Home Exercise Plan  semitandem balance, sit to stand    Consulted and Agree with Plan of Care  Patient;Family member/caregiver    Family Member Consulted  Son-in-law       Patient will benefit from skilled therapeutic intervention in order to improve the following deficits and impairments:  Decreased balance, Decreased strength, Difficulty walking, Decreased safety awareness  Visit Diagnosis: Muscle weakness (generalized)  Unsteadiness on feet     Problem List Patient Active Problem List   Diagnosis Date Noted  . PEG (percutaneous endoscopic gastrostomy) status (HCC)   . Benign essential HTN   . Hypertensive crisis   . Dysarthria, post-stroke   . Acute pain of right shoulder   . 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 Dysphagia, oropharyngeal 06/23/2017  . Respiratory failure (HCC)   . Palliative care encounter   . Aspiration pneumonia due to gastric secretions (HCC)   . Atrial fibrillation (HCC)   . 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 [place], [place], [address] Phone: [phone]   Fax:  [phone]  Name: Ronald [name] MRN: [id number] Date of Birth: 02-11-1941

## 2017-09-19 NOTE — Therapy (Signed)
Atrial fibrillation (HCC)   . 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 [place], [place], [address] Phone: [phone]   Fax:  [phone]  Name: Ronald [name] MRN: [id number] Date of Birth: 19-Mar-1941  [place] [facility] MAIN [facility] SERVICES [address] [place], [place], [address] Phone: [phone]   Fax:  [phone]  Occupational Therapy Treatment  Patient Details  Name: Ronald [name] MRN: [id number] Date of Birth: 1940/09/15 Referring Provider: Dr. Wynn Banker   Encounter Date: 09/19/2017  OT End of Session - 09/19/17 1457    Visit Number  4    Number of Visits  24    Date for OT Re-Evaluation  11/28/17    Authorization Type  Visit 4 of 10 for progress report period starting 09/05/2017    OT Start Time  1405    OT Stop Time  1445    OT Time Calculation (min)  40 min    Activity Tolerance  Patient tolerated treatment well    Behavior During Therapy  [facility] for tasks assessed/performed       Past Medical History:  Diagnosis Date  . COPD (chronic obstructive pulmonary disease) (HCC)   . Emphysema lung (HCC)   . Hx of long term use of blood thinners   . Hypertension   . Stroke [facility]) 2013  . Thyroid disease    hypothyroidism    Past Surgical History:  Procedure Laterality Date  . IR ANGIO EXTRACRAN SEL COM CAROTID INNOMINATE UNI R MOD SED  06/19/2017  . IR ANGIO VERTEBRAL SEL SUBCLAVIAN INNOMINATE UNI R MOD SED  06/19/2017  . IR GASTROSTOMY TUBE MOD SED  07/04/2017  . RADIOLOGY WITH ANESTHESIA N/A 06/19/2017   Procedure: RADIOLOGY WITH ANESTHESIA;  Surgeon: Julieanne Cotton, MD;  [facility]: [facility] OR;  Service: Radiology;  Laterality: N/A;    There were no vitals filed for this visit.  Subjective Assessment - 09/19/17 1456    Subjective   Pt. presents with sever expressive comminucation deficits.    Patient is accompained by:  Family member    Pertinent History  Pt. is a 77 y.o. male who was admitted to [facility] with a Right MCA Infarct with Lft Hemiparesis, and severe oropharyngeal dysphagia, and severe dysarthria.    Currently in Pain?  No/denies      OT TREATMENT    Neuro muscular re-education:  Pt. worked on grasping, and stacking  coins from a flat tabletop surface. Pt. worked on placing the coins into a resistive container, and pushing them through the slot while isolating his 2nd digit.  Therapeutic Exercise:  Pt. performed 2# dowel ex. for UE strengthening secondary to weakness. Bilateral shoulder flexion, chest press, circular patterns, and elbow flexion/extension were performed. 3# dumbbell ex. for elbow flexion and extension,  3# for forearm supination/pronation, 2#  for wrist flexion/extension, and radial deviation. Pt. requires rest breaks and verbal cues for proper technique. Pt. performed gross gripping with grip strengthener. Pt. worked on sustaining grip while grasping pegs and reaching at various heights. Gripper was placed in the 2nd resistive slot with the white resistive spring. Pt. Worked on pinch strengthening in the left hand for lateral, and 3pt. pinch using yellow, red, and green resistive clips. Pt. worked on placing the clips at various vertical and horizontal angles. Tactile and verbal cues were required for eliciting the desired movement.                        OT Education - 09/19/17 1457    Education provided  No    Education Details  UE ther. ex.    Person(s) Educated  Patient    Methods  Explanation  Atrial fibrillation (HCC)   . 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 [place], [place], [address] Phone: [phone]   Fax:  [phone]  Name: Ronald [name] MRN: [id number] Date of Birth: 19-Mar-1941

## 2017-09-20 ENCOUNTER — Encounter: Payer: Self-pay | Admitting: Speech Pathology

## 2017-09-20 NOTE — Therapy (Signed)
[place] MAIN [facility] SERVICES [address] [place], [place], [address] Phone: [phone]   Fax:  [phone]  Speech Language Pathology Treatment  Patient Details  Name: Ronald [name] MRN: [id number] Date of Birth: 1940-07-14 Referring Provider: Charlett Blake   Encounter Date: 09/19/2017  End of Session - 09/20/17 1233    Visit Number  11    Number of Visits  17    Date for SLP Re-Evaluation  10/13/17    SLP Start Time  1500    SLP Stop Time   1555    SLP Time Calculation (min)  55 min    Activity Tolerance  Patient tolerated treatment well       Past Medical History:  Diagnosis Date  . COPD (chronic obstructive pulmonary disease) ([place])   . Emphysema lung ([place])   . Hx of long term use of blood thinners   . Hypertension   . Stroke [facility]) 2013  . Thyroid disease    hypothyroidism    Past Surgical History:  Procedure Laterality Date  . IR ANGIO EXTRACRAN SEL COM CAROTID INNOMINATE UNI R MOD SED  06/19/2017  . IR ANGIO VERTEBRAL SEL SUBCLAVIAN INNOMINATE UNI R MOD SED  06/19/2017  . IR GASTROSTOMY TUBE MOD SED  07/04/2017  . RADIOLOGY WITH ANESTHESIA N/A 06/19/2017   Procedure: RADIOLOGY WITH ANESTHESIA;  Surgeon: Luanne Bras, MD;  Location: [place];  Service: Radiology;  Laterality: N/A;    There were no vitals filed for this visit.  Subjective Assessment - 09/20/17 1232    Subjective  Engaged and talkative. Wanted to stop eating the applesauce halfway through. Was convinced to eat 3 more large bites. He also didn't want to repeat the words.    Currently in Pain?  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  The patient received inpatient rehab services 06/23/2017 - 07/04/2017 and home health services on discharge.         Treatment Provided   Treatment provided  Cognitive-Linquistic      Pain Assessment   Pain Assessment  No/denies pain      Cognitive-Linquistic Treatment   Treatment focused on  Dysarthria;Apraxia;Voice;Other (comment);Patient/family/caregiver education Dysphagia    Skilled Treatment  Patient returned with completed log.  He has taken approximately 6 ounces of mashed potatoes X2 days. Today, the patient took 3 ounces of applesauce. After the 4th large bite he cleared his throat then coughed while talking. He maintained strong and clear vocal quality between bites.  He had 6  tsp honey-thick liquid, with cough or throat clear with 3/6 trials.  PHONATION:  Patient able to imitate words/phrases, maintaining loudness for all words with 85% accuracy. When verbally prompted, he was able to increase his volume for approximately 3 words after the prompt.          Assessment / Recommendations / Plan   Plan  Continue with current plan of care      Progression Toward Goals   Progression toward goals  Progressing toward goals       SLP Education - 09/20/17 1233    Education Details  Therapist reminded him that he needed to eat more to show that he can tolerate food and decrease the use of the PEG.     Person(s) Educated  Patient    Methods  Explanation    Comprehension  Verbalized understanding         SLP Long Term Goals - 09/14/17 1700      SLP LONG TERM GOAL #1   Title  Pt will improve speech intelligibility for words and phrases by controlling rate of speech, over-articulation, and increased loudness to achieve 80% intelligibility with min. SLP cues.    Status  Partially Met      SLP LONG TERM GOAL #2   Title  Patient will execute Swallowing and Voice Building HEP 5-7 days per week, with assistance from family as needed.    Status  On-going      SLP LONG TERM  GOAL #3   Title  Patient will tolerate least restrictive oral diet pending MBS, to be scheduled.    Status  Revised      SLP LONG TERM GOAL #4   Title  Patient / family will track oral intake and report any notable observations    Status  New    Target Date  10/03/17       Plan - 09/20/17 1233    Clinical Impression Statement  Pt. is engaged and making progress. He is improving in his swallowing and clearance and continues to speak loudly during structured phonation activities.     Speech Therapy Frequency  2x / week    Duration  Other (comment)    Treatment/Interventions  Pharyngeal strengthening exercises;Diet toleration management by SLP;Oral motor exercises;Compensatory strategies;SLP instruction and feedback;Patient/family education    Potential Considerations  Ability to learn/carryover information;Pain level;Family/community support;Co-morbidities;Previous level of function;Cooperation/participation level;Severity of impairments;Medical prognosis    SLP Home Exercise Plan  Respiratory support activities, voice activities, oral intake log, swallowing recommendations, and things to observe for.      Consulted and Agree with Plan of Care  Patient       Patient will benefit from skilled therapeutic intervention in order to improve the following deficits and impairments:   Dysarthria and anarthria  Dysphasia    Problem List Patient Active Problem List   Diagnosis Date Noted  . PEG (percutaneous endoscopic gastrostomy) status ([place])   . Benign essential HTN   . Hypertensive crisis   . Dysarthria, post-stroke   . Acute pain of right shoulder   . 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 Dysphagia, oropharyngeal 06/23/2017  . Respiratory failure ([place])   . Palliative care encounter   . Aspiration pneumonia due to gastric secretions ([place])   . Atrial fibrillation ([place])   . Stroke (cerebrum) ([place]) 06/19/2017    [name] 09/20/2017, 12:37 PM  [place] MAIN [facility] SERVICES [address] [place], [place], [address] Phone: [phone]   Fax:  [phone]   Name: Ronald [name] MRN: [id number] Date of Birth: 08/12/1940

## 2017-09-21 ENCOUNTER — Encounter: Payer: Self-pay | Admitting: Physical Therapy

## 2017-09-21 ENCOUNTER — Ambulatory Visit: Payer: Medicare Other | Admitting: Speech Pathology

## 2017-09-21 ENCOUNTER — Ambulatory Visit: Payer: Medicare Other | Admitting: Physical Therapy

## 2017-09-21 DIAGNOSIS — R471 Dysarthria and anarthria: Secondary | ICD-10-CM | POA: Diagnosis not present

## 2017-09-21 DIAGNOSIS — M6281 Muscle weakness (generalized): Secondary | ICD-10-CM

## 2017-09-21 DIAGNOSIS — R1312 Dysphagia, oropharyngeal phase: Secondary | ICD-10-CM

## 2017-09-21 DIAGNOSIS — R2681 Unsteadiness on feet: Secondary | ICD-10-CM

## 2017-09-21 DIAGNOSIS — R278 Other lack of coordination: Secondary | ICD-10-CM | POA: Diagnosis not present

## 2017-09-21 DIAGNOSIS — R4702 Dysphasia: Secondary | ICD-10-CM

## 2017-09-21 NOTE — Therapy (Signed)
[id number]   Name: Ronald [name] MRN: [id number] Date of Birth: August 23, 1940  [place] MAIN [facility] SERVICES [address] [place], [place], [address] Phone: [phone]   Fax:  [phone]  Speech Language Pathology Treatment  Patient Details  Name: Ronald [name] MRN: [id number] Date of Birth: 1940-07-16 Referring Provider: Charlett Blake   Encounter Date: 09/21/2017  End of Session - 09/21/17 1608    Visit Number  12    Number of Visits  17    Date for SLP Re-Evaluation  10/13/17    SLP Start Time  1500    SLP Stop Time   1550    SLP Time Calculation (min)  50 min    Activity Tolerance  Patient tolerated treatment well       Past Medical History:  Diagnosis Date  . COPD (chronic obstructive pulmonary disease) ([place])   . Emphysema lung ([place])   . Hx of long term use of blood thinners   . Hypertension   . Stroke [facility]) 2013  . Thyroid disease    hypothyroidism    Past Surgical History:  Procedure Laterality Date  . IR ANGIO EXTRACRAN SEL COM CAROTID INNOMINATE UNI R MOD SED  06/19/2017  . IR ANGIO VERTEBRAL SEL SUBCLAVIAN INNOMINATE UNI R MOD SED  06/19/2017  . IR GASTROSTOMY TUBE MOD SED  07/04/2017  . RADIOLOGY WITH ANESTHESIA N/A 06/19/2017   Procedure: RADIOLOGY WITH ANESTHESIA;  Surgeon: Luanne Bras, MD;  Location: [place];  Service: Radiology;  Laterality: N/A;    There were no vitals filed for this visit.  Subjective Assessment - 09/21/17 1605    Subjective  Engaged and talkative. He especially seems to enjoy repeating words he can act out    Patient is accompained by:  Family member    Currently in Pain?  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  The patient received inpatient rehab services 06/23/2017 - 07/04/2017 and home health services on discharge.         Pain Assessment   Pain Assessment  No/denies pain      Cognitive-Linquistic Treatment   Treatment focused on  Dysarthria;Apraxia;Voice;Other (comment);Patient/family/caregiver education Dysphagia    Skilled Treatment  Patient returned with log.  Today, the patient took 4 ounces of applesauce. After the 4th large bite 1/4th of the bolus fell from his mouth. On the 5th and final bite, he coughed to clear his throat. He was talking in between and his voice did not change from baseline. He had 4 tsp honey-thick liquid, with cough or throat clear with 1/3 trials. After the 1st trial he talked for about 40 seconds with no problems and after the 2nd trial he had one big cough and his voice sounded wet. On the 3rd trial he quickly took two spoonsful and coughed repeatedly but was able to clear his throat after appx 10 seconds. Therapist discontinued the trials.  PHONATION:  Patient able to imitate words/phrases, maintaining loudness for all words with 80% accuracy. When verbally prompted, he was able to increase his volume for approximately 3 words after the prompt.  [place] MAIN [facility] SERVICES [address] [place], [place], [address] Phone: [phone]   Fax:  [phone]  Speech Language Pathology Treatment  Patient Details  Name: Ronald [name] MRN: [id number] Date of Birth: 1940-07-16 Referring Provider: Charlett Blake   Encounter Date: 09/21/2017  End of Session - 09/21/17 1608    Visit Number  12    Number of Visits  17    Date for SLP Re-Evaluation  10/13/17    SLP Start Time  1500    SLP Stop Time   1550    SLP Time Calculation (min)  50 min    Activity Tolerance  Patient tolerated treatment well       Past Medical History:  Diagnosis Date  . COPD (chronic obstructive pulmonary disease) ([place])   . Emphysema lung ([place])   . Hx of long term use of blood thinners   . Hypertension   . Stroke [facility]) 2013  . Thyroid disease    hypothyroidism    Past Surgical History:  Procedure Laterality Date  . IR ANGIO EXTRACRAN SEL COM CAROTID INNOMINATE UNI R MOD SED  06/19/2017  . IR ANGIO VERTEBRAL SEL SUBCLAVIAN INNOMINATE UNI R MOD SED  06/19/2017  . IR GASTROSTOMY TUBE MOD SED  07/04/2017  . RADIOLOGY WITH ANESTHESIA N/A 06/19/2017   Procedure: RADIOLOGY WITH ANESTHESIA;  Surgeon: Luanne Bras, MD;  Location: [place];  Service: Radiology;  Laterality: N/A;    There were no vitals filed for this visit.  Subjective Assessment - 09/21/17 1605    Subjective  Engaged and talkative. He especially seems to enjoy repeating words he can act out    Patient is accompained by:  Family member    Currently in Pain?  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  The patient received inpatient rehab services 06/23/2017 - 07/04/2017 and home health services on discharge.         Pain Assessment   Pain Assessment  No/denies pain      Cognitive-Linquistic Treatment   Treatment focused on  Dysarthria;Apraxia;Voice;Other (comment);Patient/family/caregiver education Dysphagia    Skilled Treatment  Patient returned with log.  Today, the patient took 4 ounces of applesauce. After the 4th large bite 1/4th of the bolus fell from his mouth. On the 5th and final bite, he coughed to clear his throat. He was talking in between and his voice did not change from baseline. He had 4 tsp honey-thick liquid, with cough or throat clear with 1/3 trials. After the 1st trial he talked for about 40 seconds with no problems and after the 2nd trial he had one big cough and his voice sounded wet. On the 3rd trial he quickly took two spoonsful and coughed repeatedly but was able to clear his throat after appx 10 seconds. Therapist discontinued the trials.  PHONATION:  Patient able to imitate words/phrases, maintaining loudness for all words with 80% accuracy. When verbally prompted, he was able to increase his volume for approximately 3 words after the prompt.

## 2017-09-21 NOTE — Therapy (Cosign Needed)
[place] [facility] MAIN [facility] SERVICES [address] [place], [place], [address] Phone: [phone]   Fax:  [phone]  Speech Language Pathology Treatment  Patient Details  Name: Ronald [name] MRN: [id number] Date of Birth: 1941/03/22 Referring Provider: [name]   Encounter Date: 09/21/2017  End of Session - 09/21/17 1608    Visit Number  12    Number of Visits  17    Date for SLP Re-Evaluation  10/13/17    SLP Start Time  1500    SLP Stop Time   1550    SLP Time Calculation (min)  50 min    Activity Tolerance  Patient tolerated treatment well       Past Medical History:  Diagnosis Date  . COPD (chronic obstructive pulmonary disease) ([facility])   . Emphysema lung ([facility])   . Hx of long term use of blood thinners   . Hypertension   . Stroke [facility]) 2013  . Thyroid disease    hypothyroidism    Past Surgical History:  Procedure Laterality Date  . IR ANGIO EXTRACRAN SEL COM CAROTID INNOMINATE UNI R MOD SED  06/19/2017  . IR ANGIO VERTEBRAL SEL SUBCLAVIAN INNOMINATE UNI R MOD SED  06/19/2017  . IR GASTROSTOMY TUBE MOD SED  07/04/2017  . RADIOLOGY WITH ANESTHESIA N/A 06/19/2017   Procedure: RADIOLOGY WITH ANESTHESIA;  Surgeon: Julieanne Cotton, MD;  [facility];  Service: Radiology;  Laterality: N/A;    There were no vitals filed for this visit.  Subjective Assessment - 09/21/17 1605    Subjective  Engaged and talkative. He especially seems to enjoy repeating words he can act out    Patient is accompained by:  Family member    Currently in Pain?  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  The patient received inpatient rehab services 06/23/2017 - 07/04/2017 and home health services on discharge.         Pain Assessment   Pain Assessment  No/denies pain      Cognitive-Linquistic Treatment   Treatment focused on  Dysarthria;Apraxia;Voice;Other (comment);Patient/family/caregiver education Dysphagia    Skilled Treatment  Patient returned with log.  Today, the patient took 4 ounces of applesauce. After the 4th large bite 1/4th of the bolus fell from his mouth. On the 5th and final bite, he coughed to clear his throat. He was talking in between and his voice did not change from baseline. He had 4 tsp honey-thick liquid, with cough or throat clear with 1/3 trials. After the 1st trial he talked for about 40 seconds with no problems and after the 2nd trial he had one big cough and his voice sounded wet. On the 3rd trial he quickly took two spoonsful and coughed repeatedly but was able to clear his throat after appx 10 seconds. Therapist discontinued the trials.  PHONATION:  Patient able to imitate words/phrases, maintaining loudness for all words with 80% accuracy. When verbally prompted, he was able to increase his volume for approximately 3 words after the prompt.  Assessment / Recommendations / Plan   Plan  Continue with current plan of care      Progression Toward Goals   Progression toward goals  Progressing toward goals       SLP Education - 09/21/17 1606    Education provided  Yes    Education Details  Therapist reminded him that he needed to eat more to show that he can tolerate food and decrease the use of the PEG.     Person(s) Educated  Patient    Methods  Explanation    Comprehension  Verbalized understanding         SLP Long Term Goals - 09/21/17 1610      SLP LONG TERM GOAL #1   Title  Pt will improve speech intelligibility for words and phrases by controlling rate of speech, over-articulation, and increased loudness to achieve 80% intelligibility  with min. SLP cues.    Time  8    Period  Weeks    Status  Partially Met      SLP LONG TERM GOAL #2   Title  Patient will execute Swallowing and Voice Building HEP 5-7 days per week, with assistance from family as needed.    Time  8    Period  Weeks    Status  On-going    Target Date  10/13/17      SLP LONG TERM GOAL #3   Title  Patient will tolerate least restrictive oral diet pending MBS, to be scheduled.    Time  8    Period  Weeks    Status  Revised      SLP LONG TERM GOAL #4   Title  Patient / family will track oral intake and report any notable observations    Status  New       Plan - 09/21/17 1609    Clinical Impression Statement  Pt. is engaged and making progress. He is improving in his swallowing and clearance and continues to speak loudly during structured phonation activities.     Speech Therapy Frequency  2x / week    Duration  Other (comment)    Treatment/Interventions  Pharyngeal strengthening exercises;Diet toleration management by SLP;Oral motor exercises;Compensatory strategies;SLP instruction and feedback;Patient/family education    Potential Considerations  Ability to learn/carryover information;Pain level;Family/community support;Co-morbidities;Previous level of function;Cooperation/participation level;Severity of impairments;Medical prognosis    SLP Home Exercise Plan  Respiratory support activities, voice activities, oral intake log, swallowing recommendations, and things to observe for.      Consulted and Agree with Plan of Care  Patient    Family Member Consulted  Daughter       Patient will benefit from skilled therapeutic intervention in order to improve the following deficits and impairments:   Muscle weakness (generalized)  Unsteadiness on feet  Dysarthria and anarthria  Dysphasia  Other lack of coordination    Problem List Patient Active Problem List   Diagnosis Date Noted  . PEG (percutaneous endoscopic gastrostomy) status ([facility])   .  Benign essential HTN   . Hypertensive crisis   . Dysarthria, post-stroke   . Acute pain of right shoulder   . 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 Dysphagia, oropharyngeal 06/23/2017  . Respiratory failure ([facility])   . Palliative care encounter   . Aspiration pneumonia due to gastric secretions ([facility])   . Atrial fibrillation ([facility])   . 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    [place] Bent Charita Lindenberger 09/21/2017, 4:12 PM  [place] [facility] MAIN [facility] SERVICES [address] [place], [place], [address] Phone: [phone]   Fax:  [phone]   Name: Ronald [name] MRN: [id number] Date of Birth: May 01, 1941

## 2017-09-21 NOTE — Therapy (Signed)
[facility] MAIN [facility] SERVICES [address] [place], [facility]   Fax:  [phone]  Physical Therapy Treatment  Patient Details  Name: Ronald [name] MRN: [id number] Date of Birth: 1940-10-13 Referring Provider: Dr. Wynn Banker   Encounter Date: 09/21/2017  PT End of Session - 09/21/17 1540    Visit Number  3    Number of Visits  17    Date for PT Re-Evaluation  10/31/17    Authorization Type  progress note 3/10    PT Start Time  0345    PT Stop Time  0430    PT Time Calculation (min)  45 min    Equipment Utilized During Treatment  Gait belt    Activity Tolerance  Patient tolerated treatment well    Behavior During Therapy  [facility] for tasks assessed/performed       Past Medical History:  Diagnosis Date  . COPD (chronic obstructive pulmonary disease) (HCC)   . Emphysema lung (HCC)   . Hx of long term use of blood thinners   . Hypertension   . Stroke [facility]) 2013  . Thyroid disease    hypothyroidism    Past Surgical History:  Procedure Laterality Date  . IR ANGIO EXTRACRAN SEL COM CAROTID INNOMINATE UNI R MOD SED  06/19/2017  . IR ANGIO VERTEBRAL SEL SUBCLAVIAN INNOMINATE UNI R MOD SED  06/19/2017  . IR GASTROSTOMY TUBE MOD SED  07/04/2017  . RADIOLOGY WITH ANESTHESIA N/A 06/19/2017   Procedure: RADIOLOGY WITH ANESTHESIA;  Surgeon: Julieanne Cotton, MD;  [facility];  Service: Radiology;  Laterality: N/A;    There were no vitals filed for this visit.  Subjective Assessment - 09/21/17 1635    Subjective  Patient is not able to speak but follows commands 75%.     Patient is accompained by:  Family member Son in Social worker    Pertinent History  Large portions of history borrowed from MD note. Son-in-law is with patient upon arrival but unable to accurately recount history. Pt is only able to contribute minimally to history. 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 COPD with tobacco abuse, who presented to the hospital on June 20, 2017, with left-sided weakness. CT scan negative for acute changes, old left posterior frontal infarction. The patient did receive tPA. INR on admission of 1.23. CT angiogram of head and neck showed apparent occlusion of right insular anterior temporal M2 branch. CT cerebral perfusion scan showed acute changes in the right MCA territory compatible with infarction. Echocardiogram with ejection fraction 60%. No wall motion abnormalities. Carotid arteriogram on June 19, 2017, showed no acute occlusions, AV shunting, or aneurysm. The patient did require short-term intubation. MRI showed a 3.2 cm acute hemorrhage within the right lateral temporal lobe. CT of the head showed evolving multifocal right MCA territory infarct with evolving focal hemorrhagic conversion in the right temporal lobe. Aspirin for CVA prophylaxis was initially on hold as well as Coumadin. He lived with male companion and was mobile and independent prior to this most recent CVA. After he left the hospital he discharged to daughter and son-in-law's mobile home.    Limitations  Walking       Treatment: Agility ladder fwd/ bwd x 4, CGA   Agility ladder side to side x 4 , cGA  Marching in place on blue foam pad x 30 seconds for 2  HEP review, progress balance and strengthening    PT Home Exercise Plan  semitandem balance, sit to stand    Consulted and Agree with Plan of Care  Patient;Family member/caregiver    Family Member Consulted  Son-in-law       Patient will benefit from skilled therapeutic intervention in order to improve the following deficits and impairments:  Decreased balance, Decreased strength, Difficulty walking, Decreased safety awareness  Visit Diagnosis: Muscle weakness (generalized)  Unsteadiness on feet  Dysarthria and anarthria  Dysphasia  Other lack of coordination     Problem List Patient Active Problem List   Diagnosis Date Noted  . PEG (percutaneous endoscopic gastrostomy) status (HCC)   . Benign essential HTN   . Hypertensive crisis   . Dysarthria, post-stroke   . Acute pain of right shoulder   . 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 Dysphagia, oropharyngeal 06/23/2017  . Respiratory failure (HCC)   . Palliative care encounter   . Aspiration pneumonia due to gastric secretions (HCC)   . Atrial fibrillation (HCC)   . 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    Ezekiel Ina, PT DPT 09/21/2017, 4:35 PM  [facility] MAIN [facility] SERVICES [address] [place], [place], [address] Phone: [phone]   Fax:  [phone]  Name: HILTON SAEPHAN MRN: [id number] Date of Birth: 08/08/1940  [facility] MAIN [facility] SERVICES [address] [place], [facility]   Fax:  [phone]  Physical Therapy Treatment  Patient Details  Name: Ronald [name] MRN: [id number] Date of Birth: 1940-10-13 Referring Provider: Dr. Wynn Banker   Encounter Date: 09/21/2017  PT End of Session - 09/21/17 1540    Visit Number  3    Number of Visits  17    Date for PT Re-Evaluation  10/31/17    Authorization Type  progress note 3/10    PT Start Time  0345    PT Stop Time  0430    PT Time Calculation (min)  45 min    Equipment Utilized During Treatment  Gait belt    Activity Tolerance  Patient tolerated treatment well    Behavior During Therapy  [facility] for tasks assessed/performed       Past Medical History:  Diagnosis Date  . COPD (chronic obstructive pulmonary disease) (HCC)   . Emphysema lung (HCC)   . Hx of long term use of blood thinners   . Hypertension   . Stroke [facility]) 2013  . Thyroid disease    hypothyroidism    Past Surgical History:  Procedure Laterality Date  . IR ANGIO EXTRACRAN SEL COM CAROTID INNOMINATE UNI R MOD SED  06/19/2017  . IR ANGIO VERTEBRAL SEL SUBCLAVIAN INNOMINATE UNI R MOD SED  06/19/2017  . IR GASTROSTOMY TUBE MOD SED  07/04/2017  . RADIOLOGY WITH ANESTHESIA N/A 06/19/2017   Procedure: RADIOLOGY WITH ANESTHESIA;  Surgeon: Julieanne Cotton, MD;  [facility];  Service: Radiology;  Laterality: N/A;    There were no vitals filed for this visit.  Subjective Assessment - 09/21/17 1635    Subjective  Patient is not able to speak but follows commands 75%.     Patient is accompained by:  Family member Son in Social worker    Pertinent History  Large portions of history borrowed from MD note. Son-in-law is with patient upon arrival but unable to accurately recount history. Pt is only able to contribute minimally to history. 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 COPD with tobacco abuse, who presented to the hospital on June 20, 2017, with left-sided weakness. CT scan negative for acute changes, old left posterior frontal infarction. The patient did receive tPA. INR on admission of 1.23. CT angiogram of head and neck showed apparent occlusion of right insular anterior temporal M2 branch. CT cerebral perfusion scan showed acute changes in the right MCA territory compatible with infarction. Echocardiogram with ejection fraction 60%. No wall motion abnormalities. Carotid arteriogram on June 19, 2017, showed no acute occlusions, AV shunting, or aneurysm. The patient did require short-term intubation. MRI showed a 3.2 cm acute hemorrhage within the right lateral temporal lobe. CT of the head showed evolving multifocal right MCA territory infarct with evolving focal hemorrhagic conversion in the right temporal lobe. Aspirin for CVA prophylaxis was initially on hold as well as Coumadin. He lived with male companion and was mobile and independent prior to this most recent CVA. After he left the hospital he discharged to daughter and son-in-law's mobile home.    Limitations  Walking       Treatment: Agility ladder fwd/ bwd x 4, CGA   Agility ladder side to side x 4 , cGA  Marching in place on blue foam pad x 30 seconds for 2

## 2017-09-26 ENCOUNTER — Ambulatory Visit: Payer: Medicare Other | Admitting: Speech Pathology

## 2017-09-26 DIAGNOSIS — M6281 Muscle weakness (generalized): Secondary | ICD-10-CM | POA: Diagnosis not present

## 2017-09-26 DIAGNOSIS — R4702 Dysphasia: Secondary | ICD-10-CM

## 2017-09-26 DIAGNOSIS — R1312 Dysphagia, oropharyngeal phase: Secondary | ICD-10-CM | POA: Diagnosis not present

## 2017-09-26 DIAGNOSIS — R471 Dysarthria and anarthria: Secondary | ICD-10-CM | POA: Diagnosis not present

## 2017-09-26 DIAGNOSIS — R278 Other lack of coordination: Secondary | ICD-10-CM | POA: Diagnosis not present

## 2017-09-26 DIAGNOSIS — R2681 Unsteadiness on feet: Secondary | ICD-10-CM | POA: Diagnosis not present

## 2017-09-27 ENCOUNTER — Encounter: Payer: Self-pay | Admitting: Speech Pathology

## 2017-09-27 NOTE — Therapy (Signed)
[place] MAIN [facility] SERVICES [address] [place], [place], [address] Phone: [phone]   Fax:  [phone]  Speech Language Pathology Treatment  Patient Details  Name: Ronald [name] MRN: [id number] Date of Birth: 1940-05-26 Referring Provider: Charlett Blake   Encounter Date: 09/26/2017  End of Session - 09/27/17 0950    Visit Number  13    Number of Visits  17    Date for SLP Re-Evaluation  10/13/17    SLP Start Time  1600    SLP Stop Time   1650    SLP Time Calculation (min)  50 min    Activity Tolerance  Patient tolerated treatment well       Past Medical History:  Diagnosis Date  . COPD (chronic obstructive pulmonary disease) ([place])   . Emphysema lung ([place])   . Hx of long term use of blood thinners   . Hypertension   . Stroke [facility]) 2013  . Thyroid disease    hypothyroidism    Past Surgical History:  Procedure Laterality Date  . IR ANGIO EXTRACRAN SEL COM CAROTID INNOMINATE UNI R MOD SED  06/19/2017  . IR ANGIO VERTEBRAL SEL SUBCLAVIAN INNOMINATE UNI R MOD SED  06/19/2017  . IR GASTROSTOMY TUBE MOD SED  07/04/2017  . RADIOLOGY WITH ANESTHESIA N/A 06/19/2017   Procedure: RADIOLOGY WITH ANESTHESIA;  Surgeon: Luanne Bras, MD;  Location: [place];  Service: Radiology;  Laterality: N/A;    There were no vitals filed for this visit.  Subjective Assessment - 09/27/17 0950    Subjective  Engaged and talkative. He especially seems to enjoy repeating words he can act out    Currently in Pain?  No/denies    Pain Score  0-No pain    Multiple Pain Sites  No            ADULT SLP TREATMENT - 09/27/17 0001      General Information   Behavior/Cognition  Alert;Cooperative;Pleasant mood;Distractible    HPI  77 year old man, with history of prior stroke with speech and language deficits, with right hemisphere CVA 06/19/2017 with profound dysarthria and severe oropharyngeal dysphagia.  The patient received inpatient rehab  services 06/23/2017 - 07/04/2017 and home health services on discharge.         Treatment Provided   Treatment provided  Dysphagia;Cognitive-Linquistic      Pain Assessment   Pain Assessment  No/denies pain      Cognitive-Linquistic Treatment   Treatment focused on  Dysarthria;Apraxia;Voice;Other (comment);Patient/family/caregiver education Dysphagia    Skilled Treatment  SWALLOWING:   Patient returned with log.  Today, the patient took 4 ounces of applesauce. He had a clear voice in 7/9 trials and coughed in 2/9 trials. After he coughed, his voice cleared. He had 6 tbsp of honey-thick liquid, with cough or throat clear in 3/6 trials. His voice sounded wet after the 3rd spoonful til the 6th. PHONATION:  Patient able to imitate words/phrases, maintaining loudness for all words with 80% accuracy. When verbally prompted, he was able to increase his volume for approximately 3 words after the prompt.          Assessment / Recommendations / Plan   Plan  Continue with current plan of care      Progression Toward Goals   Progression toward goals  Progressing toward goals       SLP Education - 09/27/17 0950    Education provided  Yes    Education Details  Therapist reminded him that he needed to eat more to show that he can tolerate food and decrease the use of the PEG.     Person(s) Educated  Patient    Methods  Explanation    Comprehension  Verbalized understanding         SLP Long Term Goals - 09/21/17 1610      SLP LONG TERM GOAL #1   Title  Pt will improve speech intelligibility for words and phrases by controlling rate of speech, over-articulation, and increased loudness to achieve 80% intelligibility with min. SLP cues.    Time  8    Period  Weeks    Status  Partially Met      SLP LONG TERM GOAL #2   Title  Patient will execute Swallowing and Voice Building HEP 5-7 days per week, with assistance from family as needed.    Time  8    Period  Weeks    Status  On-going    Target  Date  10/13/17      SLP LONG TERM GOAL #3   Title  Patient will tolerate least restrictive oral diet pending MBS, to be scheduled.    Time  8    Period  Weeks    Status  Revised      SLP LONG TERM GOAL #4   Title  Patient / family will track oral intake and report any notable observations    Status  New       Plan - 09/27/17 0951    Clinical Impression Statement  Pt. is engaged and making progress. He is improving in his swallowing and clearance and continues to speak loudly during structured phonation activities.     Speech Therapy Frequency  2x / week    Duration  Other (comment)    Treatment/Interventions  Pharyngeal strengthening exercises;Diet toleration management by SLP;Oral motor exercises;Compensatory strategies;SLP instruction and feedback;Patient/family education    Potential Considerations  Ability to learn/carryover information;Pain level;Family/community support;Co-morbidities;Previous level of function;Cooperation/participation level;Severity of impairments;Medical prognosis    SLP Home Exercise Plan  Respiratory support activities, voice activities, oral intake log, swallowing recommendations, and things to observe for.      Consulted and Agree with Plan of Care  Patient       Patient will benefit from skilled therapeutic intervention in order to improve the following deficits and impairments:   Dysphasia    Problem List Patient Active Problem List   Diagnosis Date Noted  . PEG (percutaneous endoscopic gastrostomy) status ([place])   . Benign essential HTN   . Hypertensive crisis   . Dysarthria, post-stroke   . Acute pain of right shoulder   . Parietal lobe infarction ([place]) 06/23/2017  . Dysphagia, oropharyngeal 06/23/2017  . Respiratory failure ([place])   . Palliative care encounter   . Aspiration pneumonia due to gastric secretions ([place])   . Atrial fibrillation ([place])   . Stroke (cerebrum) ([place]) 06/19/2017    [name] 09/27/2017, 9:52 AM  [place] MAIN [facility] SERVICES [address] [place], [place], [address] Phone: [phone]   Fax:  [phone]   Name: Ronald [name] MRN: [id number] Date of Birth: [birth year]

## 2017-09-28 ENCOUNTER — Ambulatory Visit: Payer: Medicare Other | Admitting: Speech Pathology

## 2017-09-28 ENCOUNTER — Encounter: Payer: Self-pay | Admitting: Speech Pathology

## 2017-09-28 DIAGNOSIS — R2681 Unsteadiness on feet: Secondary | ICD-10-CM | POA: Diagnosis not present

## 2017-09-28 DIAGNOSIS — R471 Dysarthria and anarthria: Secondary | ICD-10-CM | POA: Diagnosis not present

## 2017-09-28 DIAGNOSIS — M6281 Muscle weakness (generalized): Secondary | ICD-10-CM | POA: Diagnosis not present

## 2017-09-28 DIAGNOSIS — R4702 Dysphasia: Secondary | ICD-10-CM | POA: Diagnosis not present

## 2017-09-28 DIAGNOSIS — R278 Other lack of coordination: Secondary | ICD-10-CM | POA: Diagnosis not present

## 2017-09-28 DIAGNOSIS — R1312 Dysphagia, oropharyngeal phase: Secondary | ICD-10-CM | POA: Diagnosis not present

## 2017-09-28 NOTE — Therapy (Signed)
loud to exercise voice and swallowing     Person(s) Educated  Patient    Methods  Explanation    Comprehension  Verbalized understanding         SLP Long Term Goals - 09/28/17 1503      SLP LONG TERM GOAL #1   Title  Pt will improve speech intelligibility for words and phrases by controlling rate of speech, over-articulation, and increased loudness to achieve 80% intelligibility with min. SLP cues.    Status  Partially Met      SLP LONG TERM GOAL #2   Title  Patient will execute Swallowing and Voice Building HEP 5-7 days per week, with assistance from family as needed.    Status  On-going      SLP LONG TERM GOAL #4   Title  Patient / family will track oral intake and report any notable  observations    Status  Achieved       Plan - 09/28/17 1458    Clinical Impression Statement  The patient demonstrates improving vocal strength and endurance.  He is consistently taking pureed solids, in therapy and at home with his daughter, with minimal difficulty.  He continues to cough/throat clear with honey-thick liquid.  The patient's daughter is competent to oversee his home oral intake.  She returns his oral intake log, noting important information.  The patient is ready to begin transitioning to an oral diet for foods.  He will need the PEG for hydration until cleared for a safe liquid consistency.    Recommend consult with dietician to help with transition from tube feeding to oral diet.  Also recommend a repeat MBS in 2-3 weeks to assess consistencies and bolus size.  Will continue to address speech and swallowing via trial POs and high effort/high intensity vocal exercises.    Speech Therapy Frequency  2x / week    Duration  Other (comment)    Treatment/Interventions  Pharyngeal strengthening exercises;Diet toleration management by SLP;Oral motor exercises;Compensatory strategies;SLP instruction and feedback;Patient/family education    Potential to Achieve Goals  Good    Potential Considerations  Ability to learn/carryover information;Pain level;Family/community support;Co-morbidities;Previous level of function;Cooperation/participation level;Severity of impairments;Medical prognosis    SLP Home Exercise Plan  Respiratory support activities, voice activities, oral intake log, swallowing recommendations, and things to observe for.      Consulted and Agree with Plan of Care  Patient       Patient will benefit from skilled therapeutic intervention in order to improve the following deficits and impairments:   Dysarthria and anarthria  Oropharyngeal dysphagia    Problem List Patient Active Problem List   Diagnosis Date Noted  . PEG (percutaneous endoscopic gastrostomy) status ([place])    . Benign essential HTN   . Hypertensive crisis   . Dysarthria, post-stroke   . Acute pain of right shoulder   . Parietal lobe infarction ([place]) 06/23/2017  . Dysphagia, oropharyngeal 06/23/2017  . Respiratory failure ([place])   . Palliative care encounter   . Aspiration pneumonia due to gastric secretions ([place])   . Atrial fibrillation ([place])   . Stroke (cerebrum) ([place]) 06/19/2017   [name], [name] SLP  Lou Miner 09/28/2017, 3:06 PM  [place] MAIN [facility] SERVICES [address] [place], [place], [address] Phone: [phone]   Fax:  [phone]   Name: Ronald [name] MRN: [id number] Date of Birth: Mar 29, 1941  [place] MAIN [facility] SERVICES [address] [place], [place], [address] Phone: [phone]   Fax:  [phone]  Speech Language Pathology Treatment/Progress Note   Speech Therapy Progress Note   Dates of reporting period  09/13/2017   to   5/30/219   Patient Details  Name: Ronald [name] MRN: [id number] Date of Birth: 1940-09-16 Referring Provider: Charlett Blake   Encounter Date: 09/28/2017  End of Session - 09/28/17 1456    Visit Number  14    Number of Visits  17    Date for SLP Re-Evaluation  10/13/17    SLP Start Time  40    SLP Stop Time   1350    SLP Time Calculation (min)  50 min    Activity Tolerance  Patient tolerated treatment well       Past Medical History:  Diagnosis Date  . COPD (chronic obstructive pulmonary disease) ([place])   . Emphysema lung ([place])   . Hx of long term use of blood thinners   . Hypertension   . Stroke [facility]) 2013  . Thyroid disease    hypothyroidism    Past Surgical History:  Procedure Laterality Date  . IR ANGIO EXTRACRAN SEL COM CAROTID INNOMINATE UNI R MOD SED  06/19/2017  . IR ANGIO VERTEBRAL SEL SUBCLAVIAN INNOMINATE UNI R MOD SED  06/19/2017  . IR GASTROSTOMY TUBE MOD SED  07/04/2017  . RADIOLOGY WITH ANESTHESIA N/A 06/19/2017   Procedure: RADIOLOGY WITH ANESTHESIA;  Surgeon: Luanne Bras, MD;  Location: [place];  Service: Radiology;  Laterality: N/A;    There were no vitals filed for this visit.  Subjective Assessment - 09/28/17 1456    Subjective  Eager to resume an oral diet    Currently in Pain?  No/denies            ADULT SLP TREATMENT - 09/28/17 0001      General Information   Behavior/Cognition  Alert;Cooperative;Pleasant mood;Distractible    HPI  77 year old man, with history of prior stroke with speech and language deficits, with right hemisphere CVA 06/19/2017 with profound dysarthria and severe oropharyngeal dysphagia.  The patient received inpatient rehab  services 06/23/2017 - 07/04/2017 and home health services on discharge.         Treatment Provided   Treatment provided  Dysphagia;Cognitive-Linquistic      Pain Assessment   Pain Assessment  No/denies pain      Cognitive-Linquistic Treatment   Treatment focused on  Dysarthria;Apraxia;Voice;Other (comment);Patient/family/caregiver education Dysphagia    Skilled Treatment  SWALLOWING:   Patient returned with completed log.  He is consistently eating 2-3 servings per day with minimal difficulty per his daughter's notes.  Today, the patient took 4 ounces of applesauce with no overt difficulties and maintaining strong and clear vocal quality between bites.  He had 6  tsp honey-thick liquid, with cough or throat clear with 4/6 trials.  PHONATION: Patient is able to generate a loud h+vowel.   Loudly recite automatic speech series with 100% accuracy.  Patient able to imitate words/phrases, maintaining loudness for all words with 85% accuracy.        Assessment / Recommendations / Plan   Plan  Continue with current plan of care      Progression Toward Goals   Progression toward goals  Progressing toward goals       SLP Education - 09/28/17 1456    Education provided  Yes    Education Details  Be

## 2017-10-02 ENCOUNTER — Other Ambulatory Visit: Payer: Self-pay

## 2017-10-02 ENCOUNTER — Encounter: Payer: Self-pay | Admitting: Physical Medicine & Rehabilitation

## 2017-10-02 ENCOUNTER — Ambulatory Visit: Payer: Medicare Other | Admitting: Physical Therapy

## 2017-10-02 ENCOUNTER — Encounter: Payer: Medicare Other | Attending: Physical Medicine & Rehabilitation

## 2017-10-02 ENCOUNTER — Ambulatory Visit: Payer: Medicare Other | Admitting: Occupational Therapy

## 2017-10-02 ENCOUNTER — Ambulatory Visit: Payer: Medicare Other | Attending: Physical Medicine & Rehabilitation | Admitting: Speech Pathology

## 2017-10-02 ENCOUNTER — Ambulatory Visit (HOSPITAL_BASED_OUTPATIENT_CLINIC_OR_DEPARTMENT_OTHER): Payer: Medicare Other | Admitting: Physical Medicine & Rehabilitation

## 2017-10-02 ENCOUNTER — Encounter: Payer: Self-pay | Admitting: Occupational Therapy

## 2017-10-02 ENCOUNTER — Other Ambulatory Visit: Payer: Self-pay | Admitting: Physical Medicine & Rehabilitation

## 2017-10-02 ENCOUNTER — Encounter: Payer: Self-pay | Admitting: Physical Therapy

## 2017-10-02 VITALS — BP 160/87 | HR 91 | Ht 70.0 in | Wt 168.2 lb

## 2017-10-02 DIAGNOSIS — R1312 Dysphagia, oropharyngeal phase: Secondary | ICD-10-CM

## 2017-10-02 DIAGNOSIS — I69322 Dysarthria following cerebral infarction: Secondary | ICD-10-CM | POA: Insufficient documentation

## 2017-10-02 DIAGNOSIS — I482 Chronic atrial fibrillation, unspecified: Secondary | ICD-10-CM

## 2017-10-02 DIAGNOSIS — Z7901 Long term (current) use of anticoagulants: Secondary | ICD-10-CM | POA: Diagnosis not present

## 2017-10-02 DIAGNOSIS — I10 Essential (primary) hypertension: Secondary | ICD-10-CM | POA: Diagnosis not present

## 2017-10-02 DIAGNOSIS — J449 Chronic obstructive pulmonary disease, unspecified: Secondary | ICD-10-CM | POA: Diagnosis not present

## 2017-10-02 DIAGNOSIS — Z87891 Personal history of nicotine dependence: Secondary | ICD-10-CM | POA: Insufficient documentation

## 2017-10-02 DIAGNOSIS — I4891 Unspecified atrial fibrillation: Secondary | ICD-10-CM | POA: Diagnosis not present

## 2017-10-02 DIAGNOSIS — R269 Unspecified abnormalities of gait and mobility: Secondary | ICD-10-CM | POA: Insufficient documentation

## 2017-10-02 DIAGNOSIS — R278 Other lack of coordination: Secondary | ICD-10-CM

## 2017-10-02 DIAGNOSIS — I639 Cerebral infarction, unspecified: Secondary | ICD-10-CM | POA: Diagnosis not present

## 2017-10-02 DIAGNOSIS — Z931 Gastrostomy status: Secondary | ICD-10-CM | POA: Diagnosis not present

## 2017-10-02 DIAGNOSIS — M6281 Muscle weakness (generalized): Secondary | ICD-10-CM | POA: Insufficient documentation

## 2017-10-02 DIAGNOSIS — R2681 Unsteadiness on feet: Secondary | ICD-10-CM

## 2017-10-02 DIAGNOSIS — R471 Dysarthria and anarthria: Secondary | ICD-10-CM | POA: Diagnosis not present

## 2017-10-02 DIAGNOSIS — I69398 Other sequelae of cerebral infarction: Secondary | ICD-10-CM | POA: Insufficient documentation

## 2017-10-02 DIAGNOSIS — E039 Hypothyroidism, unspecified: Secondary | ICD-10-CM | POA: Diagnosis not present

## 2017-10-02 DIAGNOSIS — R4702 Dysphasia: Secondary | ICD-10-CM | POA: Insufficient documentation

## 2017-10-02 NOTE — Progress Notes (Signed)
Subjective:    Patient ID: Ronald [name], male    DOB: January 24, 1941, 77 y.o.   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  He lives with male companion reportedly mobile and independent.  Plans to stay with daughter on discharge.  Cranial CT scan negative for acute changes, old left posterior frontal infarction.  The patient did receive tPA.  INR on admission of 1.23.  CT angiogram of head and neck showed apparent occlusion of right insular anterior temporal M2 branch.  CT cerebral perfusion scan showed acute changes in the right MCA territory compatible with infarction.  Echocardiogram with ejection fraction 60%.  No wall motion abnormalities.  Carotid arteriogram on June 19, 2017, showed no acute occlusions, AV shunting, or aneurysm.  The patient did require short-term intubation. MRI showed a 3.2 cm acute hemorrhage within the right lateral temporal lobe.  CT of the head showed evolving multifocal right MCA territory infarct with evolving focal hemorrhagic conversion in the right temporal lobe.  HPI  Patient was restarted on warfarin after resolution of hemorrhage. Weight is steady 166-168lb Still getiing TF for breakfast and lunch, Pt eats dinner usually >50%,  H20 QID plus water  Upgraded to D2 diet, SLP feels like he is ready for repeat MBS Not taking pills via po route  No falls , pt feeding cheickens twice a day Pain Inventory Average Pain 3 Pain Right Now 0 My pain is dull  In the last 24 hours, has pain interfered with the following? General activity 1 Relation with others 0 Enjoyment of life 0 What TIME of day is your pain at its worst? daytime Sleep (in general) Fair  Pain is worse with: unsure Pain improves with: rest  and medication Relief from Meds: 4  Mobility walk without assistance  Function disabled: date disabled n/a I need assistance with the following:  meal prep, household duties and shopping  Neuro/Psych No problems in this area  Prior Studies Any changes since last visit?  no  Physicians involved in your care Any changes since last visit?  no   Family History  Problem Relation Age of Onset  . Parkinson's disease Mother   . Colon cancer Mother   . Emphysema Father   . Stroke Sister   . Stroke Brother    Social History   Socioeconomic History  . Marital status: Single    Spouse name: Not on file  . Number of children: Not on file  . Years of education: Not on file  . Highest education level: Not on file  Occupational History  . Not on file  Social Needs  . Financial resource strain: Not on file  . Food insecurity:    Worry: Not on file    Inability: Not on file  . Transportation needs:    Medical: Not on file    Non-medical: Not on file  Tobacco Use  . Smoking status: Former Smoker    Last attempt to quit: 06/05/2017    Years since quitting: 0.3  . Smokeless tobacco: Former Engineer, water and Sexual Activity  . Alcohol use: Not Currently  . Drug use: No  . Sexual activity: Not on file  Lifestyle  . Physical activity:    Days per week: Not on file    Minutes per session: Not on  file  . Stress: Not on file  Relationships  . Social connections:    Talks on phone: Not on file    Gets together: Not on file    Attends religious service: Not on file    Active member of club or organization: Not on file    Attends meetings of clubs or organizations: Not on file    Relationship status: Not on file  Other Topics Concern  . Not on file  Social History Narrative  . Not on file   Past Surgical History:  Procedure Laterality Date  . IR ANGIO EXTRACRAN SEL COM CAROTID INNOMINATE UNI R MOD SED  06/19/2017  . IR ANGIO VERTEBRAL SEL SUBCLAVIAN INNOMINATE UNI R MOD  SED  06/19/2017  . IR GASTROSTOMY TUBE MOD SED  07/04/2017  . RADIOLOGY WITH ANESTHESIA N/A 06/19/2017   Procedure: RADIOLOGY WITH ANESTHESIA;  Surgeon: Julieanne Cotton, MD;  Location: [facility] OR;  Service: Radiology;  Laterality: N/A;   Past Medical History:  Diagnosis Date  . COPD (chronic obstructive pulmonary disease) (HCC)   . Emphysema lung (HCC)   . Hx of long term use of blood thinners   . Hypertension   . Stroke [facility]) 2013  . Thyroid disease    hypothyroidism   BP (!) 160/87   Pulse 91   Ht 5\' 10"  (1.778 m)   Wt 168 lb 3.2 oz (76.3 kg)   SpO2 97%   BMI 24.13 kg/m   Opioid Risk Score:   Fall Risk Score:  `1  Depression screen PHQ 2/9  Depression screen PHQ 2/9 10/02/2017  Decreased Interest 0  Down, Depressed, Hopeless 0  PHQ - 2 Score 0     Review of Systems  Constitutional: Negative.   HENT: Negative.   Eyes: Negative.   Respiratory: Negative.   Cardiovascular: Negative.   Gastrointestinal: Negative.   Endocrine: Negative.   Genitourinary: Negative.   Musculoskeletal: Negative.   Skin: Negative.   Allergic/Immunologic: Negative.   Neurological: Negative.   Hematological: Negative.   Psychiatric/Behavioral: Negative.   All other systems reviewed and are negative.      Objective:   Physical Exam  Constitutional: He appears well-developed and well-nourished.  HENT:  Head: Normocephalic and atraumatic.  Nursing note and vitals reviewed. General no acute distress Mood and affect appropriate Speech with severe dysarthria, does have limited verbal expression.  Uses hand signals quite extensively. Motor strength is 4/5 in the left deltoid bicep tricep grip hip flexor knee extensor ankle dorsiflexor 5/5 on the right side Ambulates without a device no evidence of toe drag or knee instability PEG tube site is clean dry and intact no drainage Lungs are clear Heart irregularly irregular no murmurs       Assessment & Plan:  1.  Embolic right MCA  distribution CVA secondary to atrial fibrillation. He remains on warfarin for anticoagulation follows with his primary care as well as cardiology 2.  Left hemiparesis overall improved it is fairly mild and does not treat him functionally.  Finished up with OT PT recommended 3.  Severe dysarthria as well as dysphagia.  Continuing outpatient speech therapy agree with repeat modified barium swallow Transition to oral feeds.  At this point I would recommend that if he takes <50% of a meal, administer 400 mL of tube feed as a bolus.  Referral made To outpatient dietary for evaluation. Physical medicine rehab follow-up in 6 weeks

## 2017-10-02 NOTE — Therapy (Signed)
[facility] MAIN [facility] SERVICES [address] [place], [place], [address] Phone: [phone]   Fax:  [phone]  Occupational Therapy Treatment  Patient Details  Name: Ronald [name] MRN: [id number] Date of Birth: April 21, 1941 Referring Provider: Dr. Wynn Banker   Encounter Date: 10/02/2017  OT End of Session - 10/02/17 1411    Visit Number  5    Number of Visits  24    Date for OT Re-Evaluation  11/28/17    Authorization Type  Visit 5 of 10 for progress report period starting 09/05/2017    OT Start Time  1405    OT Stop Time  1445    OT Time Calculation (min)  40 min    Activity Tolerance  Patient tolerated treatment well    Behavior During Therapy  [facility] for tasks assessed/performed       Past Medical History:  Diagnosis Date  . COPD (chronic obstructive pulmonary disease) (HCC)   . Emphysema lung (HCC)   . Hx of long term use of blood thinners   . Hypertension   . Stroke [facility]) 2013  . Thyroid disease    hypothyroidism    Past Surgical History:  Procedure Laterality Date  . IR ANGIO EXTRACRAN SEL COM CAROTID INNOMINATE UNI R MOD SED  06/19/2017  . IR ANGIO VERTEBRAL SEL SUBCLAVIAN INNOMINATE UNI R MOD SED  06/19/2017  . IR GASTROSTOMY TUBE MOD SED  07/04/2017  . RADIOLOGY WITH ANESTHESIA N/A 06/19/2017   Procedure: RADIOLOGY WITH ANESTHESIA;  Surgeon: Julieanne Cotton, MD;  Location: [facility] OR;  Service: Radiology;  Laterality: N/A;    There were no vitals filed for this visit.  Subjective Assessment - 10/02/17 1409    Subjective   Pt. presents with severe expressive comminucation deficits.    Pertinent History  Pt. is a 77 y.o. male who was admitted to [facility] with a Right MCA Infarct with Lft Hemiparesis, and severe oropharyngeal dysphagia, and severe dysarthria.    Currently in Pain?  No/denies      OT TREATMENT    Neuro muscular re-education:  Pt. worked on grasping, stacking, and flipping 2" large pegs on the Instructo board  placed at a tabletop surface. Pt. required extensive cues for hand placement, initiation, and movement patterns. Pt. Worked on grasping coins from a tabletop surface, placing them into a resistive container, and pushing them through the slot while isolating his 2nd digit.                      OT Education - 10/02/17 1410    Education provided  Yes    Education Details  [facility] skills, UE ther. ex    Person(s) Educated  Patient    Methods  Explanation    Comprehension  Verbalized understanding          OT Long Term Goals - 09/05/17 1834      OT LONG TERM GOAL #1   Title  Pt. will demonstrate independence with visual compensatory strategies during ADL, and IADL tasks.    Baseline  Eval: Visual impairment present. Vision to be further assessed to determine how it may be affecting ADL/IADL functioning.    Time  12    Period  Weeks    Status  New    Target Date  11/28/17      OT LONG TERM GOAL #2   Title  Pt. will increase Left grip strength will increase by 10# to improve ADL/IADL  functioning.    Baseline  Eval: Limited LUE grip strength    Time  12    Period  Weeks    Status  New    Target Date  11/28/17      OT LONG TERM GOAL #3   Title  Pt. will improve [facility] skills by 5 sec. of speed to be able to manipulate ADL/IADL objects.    Baseline  Eval: Limited left hand coordination skills.    Time  12    Period  Weeks    Status  New    Target Date  11/28/17      OT LONG TERM GOAL #4   Title  Pt. will accurately identify 10/10 home safety hazards for ADLs, and IADLs    Baseline  EVal: Limited.    Time  12    Period  Weeks    Status  New    Target Date  11/28/17            Plan - 10/02/17 1411    Clinical Impression Statement  Pt. continues to present with severe exprssive communication impairments. Pt. requires extensive cues for motor planning, and to initiate [facility] movement patterns. Pt. continues to work on improving BUE strength, and coordination skills,  and coordination skills for improved engagement in ADL, and IADL tasks.    Occupational Profile and client history currently impacting functional performance  Pt. resides with his daughter    Occupational performance deficits (Please refer to evaluation for details):  ADL's;IADL's    Rehab Potential  Poor    Current Impairments/barriers affecting progress:  Positive Indicators: age, family support, Negative Indicators: multiple comorbidities    OT Frequency  2x / week    OT Duration  12 weeks    OT Treatment/Interventions  Self-care/ADL training;Therapeutic exercise;Therapeutic activities;Neuromuscular education;DME and/or AE instruction;Patient/family education;Energy conservation    Clinical Decision Making  Several treatment options, min-mod task modification necessary    Consulted and Agree with Plan of Care  Patient       Patient will benefit from skilled therapeutic intervention in order to improve the following deficits and impairments:  Impaired UE functional use, Decreased activity tolerance, Decreased endurance, Decreased coordination, Impaired vision/preception, Decreased cognition, Decreased strength  Visit Diagnosis: Muscle weakness (generalized)  Other lack of coordination    Problem List Patient Active Problem List   Diagnosis Date Noted  . PEG (percutaneous endoscopic gastrostomy) status (HCC)   . Benign essential HTN   . Hypertensive crisis   . Dysarthria, post-stroke   . Acute pain of right shoulder   . 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 Dysphagia, oropharyngeal 06/23/2017  . Respiratory failure (HCC)   . Palliative care encounter   . Aspiration pneumonia due to gastric secretions (HCC)   . Atrial fibrillation (HCC)   . 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    Olegario Messier, MS, OTR/L 10/02/2017, 2:22 PM  [facility] MAIN [facility] SERVICES [address] [place], [place], [address] Phone: [phone]   Fax:   [phone]  Name: Ronald [name] MRN: [id number] Date of Birth: [birth year]

## 2017-10-02 NOTE — Therapy (Signed)
[place] [facility] MAIN [facility] SERVICES [address] [place], [place], [address] Phone: [phone]   Fax:  [phone]  Physical Therapy Treatment  Patient Details  Name: Ronald [name] MRN: [id number] Date of Birth: 15-Sep-1940 Referring Provider: Dr. Wynn Banker   Encounter Date: 10/02/2017  PT End of Session - 10/02/17 1614    Visit Number  4    Number of Visits  17    Date for PT Re-Evaluation  10/31/17    Authorization Type  progress note 4/10    PT Start Time  1602    PT Stop Time  1645    PT Time Calculation (min)  43 min    Equipment Utilized During Treatment  Gait belt    Activity Tolerance  Patient tolerated treatment well    Behavior During Therapy  [facility] for tasks assessed/performed       Past Medical History:  Diagnosis Date  . COPD (chronic obstructive pulmonary disease) (HCC)   . Emphysema lung (HCC)   . Hx of long term use of blood thinners   . Hypertension   . Stroke [facility]) 2013  . Thyroid disease    hypothyroidism    Past Surgical History:  Procedure Laterality Date  . IR ANGIO EXTRACRAN SEL COM CAROTID INNOMINATE UNI R MOD SED  06/19/2017  . IR ANGIO VERTEBRAL SEL SUBCLAVIAN INNOMINATE UNI R MOD SED  06/19/2017  . IR GASTROSTOMY TUBE MOD SED  07/04/2017  . RADIOLOGY WITH ANESTHESIA N/A 06/19/2017   Procedure: RADIOLOGY WITH ANESTHESIA;  Surgeon: Julieanne Cotton, MD;  Location: [facility] OR;  Service: Radiology;  Laterality: N/A;    There were no vitals filed for this visit.  Subjective Assessment - 10/02/17 1613    Subjective  Patient nods when asked if feeling okay; Shakes head no when asked if he is experiencing any pain; denies any recent falls;     Patient is accompained by:  Family member Son in Social worker    Pertinent History  Large portions of history borrowed from MD note. Son-in-law is with patient upon arrival but unable to accurately recount history. Pt is only able to contribute minimally to history. 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 COPD with tobacco abuse, who presented to the hospital on June 20, 2017, with left-sided weakness. CT scan negative for acute changes, old left posterior frontal infarction. The patient did receive tPA. INR on admission of 1.23. CT angiogram of head and neck showed apparent occlusion of right insular anterior temporal M2 branch. CT cerebral perfusion scan showed acute changes in the right MCA territory compatible with infarction. Echocardiogram with ejection fraction 60%. No wall motion abnormalities. Carotid arteriogram on June 19, 2017, showed no acute occlusions, AV shunting, or aneurysm. The patient did require short-term intubation. MRI showed a 3.2 cm acute hemorrhage within the right lateral temporal lobe. CT of the head showed evolving multifocal right MCA territory infarct with evolving focal hemorrhagic conversion in the right temporal lobe. Aspirin for CVA prophylaxis was initially on hold as well as Coumadin. He lived with male companion and was mobile and independent prior to this most recent CVA. After he left the hospital he discharged to daughter and son-in-law's mobile home.    Limitations  Walking    Currently in Pain?  No/denies    Multiple Pain Sites  No  Treatment: Warm up on Nustep BUE/BLE level 2 x4 min (unbilled); Resisted walking 12.5#, 4 way (forward/backward, side/side) x2 laps each with CGA to close supervision for safety; Required min VCs for positioning and direction for better dynamic balance challenge;   Standing on airex: Alternate toe taps on 4 inch step unsupported x15 bilaterally with cues to slow down for better dynamic control; Modified tandem stance with BUE ball up/down x10 reps each foot in front with CGA for safety and cues for foot placement;  Feet apart, BUE balloon taps  unsupported x3 min with CGA for safety and cues to improve weight shift with reaching outside base of support for better stance control;   Leg press 120 lbs x 15 x 2 , cues for speed and positioning;  Sit<>Stand from mat table on airex pad x10 reps x2 sets with arms across chest; Required mod VCs for forward weight shift for better sit<>Stand ability;   Patient is able to follow cues correctly 80% of the time. No complaints.                       PT Education - 10/02/17 1614    Education provided  Yes    Education Details  gait safety, balance, strengthening;     Person(s) Educated  Patient    Methods  Explanation;Verbal cues    Comprehension  Verbalized understanding;Returned demonstration;Need further instruction       PT Short Term Goals - 09/06/17 2053      PT SHORT TERM GOAL #1   Title  Pt will be independent with HEP in order to improve strength and balance in order to decrease fall risk and improve function at home and work.    Time  4    Period  Weeks    Status  New    Target Date  10/04/17        PT Long Term Goals - 09/06/17 2054      PT LONG TERM GOAL #1   Title  Pt will improve BERG by at least 3 points in order to demonstrate clinically significant improvement in balance.    Baseline  09/05/17: 51/56    Time  8    Period  Weeks    Status  New    Target Date  10/31/17      PT LONG TERM GOAL #2   Title  Pt will decrease 5TSTS by at least 3 seconds in order to demonstrate clinically significant improvement in LE strength    Baseline  09/05/17: 25.8s    Time  8    Period  Weeks    Status  New    Target Date  10/31/17      PT LONG TERM GOAL #3   Title  Pt will increase self-selected 32m gait speed by at least 0.13 m/s in order to demonstrate clinically significant improvement in community ambulation.     Baseline  09/05/17: 0.88 m/s    Time  8    Period  Weeks    Status  New    Target Date  10/31/17            Plan - 10/02/17  1641    Clinical Impression Statement  Patient requires min-mod VCs and demonstration for correct exercise technique and positioning; He is able to follow instruction approximately 80% of the time but does reuqire simple instruction; Instructed patient in advanced balance and strengthening exercise. utilized compliant surface for better balance challenge;  Patient able to do exercise with less rail assist with better stance control; He would benefit from additional skilled PT intervention to improve strength, balance and gait safety;     Rehab Potential  Good    PT Frequency  2x / week    PT Duration  8 weeks    PT Treatment/Interventions  ADLs/Self Care Home Management;Aquatic Therapy;Canalith Repostioning;Cryotherapy;Electrical Stimulation;Iontophoresis 4mg /ml Dexamethasone;DME Instruction;Moist Heat;Traction;Ultrasound;Gait training;Stair training;Therapeutic activities;Therapeutic exercise;Balance training;Neuromuscular re-education;Cognitive remediation;Patient/family education;Manual techniques;Passive range of motion;Energy conservation;Vestibular    PT Next Visit Plan  HEP review, progress balance and strengthening    PT Home Exercise Plan  semitandem balance, sit to stand    Consulted and Agree with Plan of Care  Patient;Family member/caregiver    Family Member Consulted  Son-in-law       Patient will benefit from skilled therapeutic intervention in order to improve the following deficits and impairments:  Decreased balance, Decreased strength, Difficulty walking, Decreased safety awareness  Visit Diagnosis: Muscle weakness (generalized)  Other lack of coordination  Unsteadiness on feet     Problem List Patient Active Problem List   Diagnosis Date Noted  . PEG (percutaneous endoscopic gastrostomy) status (HCC)   . Benign essential HTN   . Hypertensive crisis   . Dysarthria, post-stroke   . Acute pain of right shoulder   . 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  Dysphagia, oropharyngeal 06/23/2017  . Respiratory failure (HCC)   . Palliative care encounter   . Aspiration pneumonia due to gastric secretions (HCC)   . Atrial fibrillation (HCC)   . 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    Aqeel Norgaard PT, DPT 10/02/2017, 4:46 PM  [place] [facility] MAIN [facility] SERVICES [address] [place], [place], [address] Phone: [phone]   Fax:  [phone]  Name: KRISZTIAN SIMONETTI MRN: [id number] Date of Birth: 12/11/1940

## 2017-10-03 ENCOUNTER — Encounter: Payer: Self-pay | Admitting: Speech Pathology

## 2017-10-03 NOTE — Therapy (Signed)
show that he can tolerate food and decrease the use of the PEG.     Person(s) Educated  Patient    Methods  Explanation    Comprehension  Verbalized understanding         SLP Long Term Goals - 09/28/17 1503      SLP LONG TERM GOAL #1   Title  Pt will improve speech intelligibility for words and phrases by controlling rate of speech, over-articulation, and increased loudness to achieve 80% intelligibility with min. SLP cues.    Status  Partially Met      SLP LONG TERM GOAL #2   Title  Patient will execute Swallowing and Voice Building HEP 5-7 days per week, with assistance from family as needed.    Status  On-going      SLP LONG TERM GOAL #4   Title  Patient / family will track oral intake and report any notable  observations    Status  Achieved       Plan - 10/03/17 0951    Clinical Impression Statement  : Pt. is engaged and making progress. He is improving in his swallowing and clearance and continues to speak loudly during structured phonation activities. His doctor has cleared him for a MBSS and he will have one scheduled in the near future.     Speech Therapy Frequency  2x / week    Duration  Other (comment) 8 wks    Treatment/Interventions  Pharyngeal strengthening exercises;Diet toleration management by SLP;Oral motor exercises;Compensatory strategies;SLP instruction and feedback;Patient/family education    Potential to Achieve Goals  Good    Potential Considerations  Ability to learn/carryover information;Pain level;Family/community support;Co-morbidities;Previous level of function;Cooperation/participation level;Severity of impairments;Medical prognosis    SLP Home Exercise Plan  Respiratory support activities, voice activities, oral intake log, swallowing recommendations, and things to observe for.      Consulted and Agree with Plan of Care  Patient       Patient will benefit from skilled therapeutic intervention in order to improve the following deficits and impairments:   Dysarthria and anarthria  Dysphasia  Oropharyngeal dysphagia    Problem List Patient Active Problem List   Diagnosis Date Noted  . PEG (percutaneous endoscopic gastrostomy) status ([place])   . Benign essential HTN   . Hypertensive crisis   . Dysarthria, post-stroke   . Acute pain of right shoulder   . 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 Dysphagia, oropharyngeal 06/23/2017  . Respiratory failure ([place])   . Palliative care encounter   . Aspiration pneumonia due to gastric secretions ([place])   . Atrial fibrillation ([place])   . 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 [place], [place],  [address] Phone: [phone]   Fax:  [phone]   Name: Ronald [name] MRN: [id number] Date of Birth: [birth year]  [name], [name], [address] Phone: [phone]   Fax:  [phone]  Speech Language Pathology Treatment  Patient Details  Name: Ronald [name] MRN: [id number] Date of Birth: 04/01/1941 Referring Provider: Kirsteins, Andrew E   Encounter Date: 10/02/2017  End of Session - 10/03/17 0950    Visit Number  13    Number of Visits  17    Date for SLP Re-Evaluation  10/13/17    SLP Start Time  1500    SLP Stop Time   1550    SLP Time Calculation (min)  50 min    Activity Tolerance  Patient tolerated treatment well       Past Medical History:  Diagnosis Date  . COPD (chronic obstructive pulmonary disease) (HCC)   . Emphysema lung (HCC)   . Hx of long term use of blood thinners   . Hypertension   . Stroke (HCC) 2013  . Thyroid disease    hypothyroidism    Past Surgical History:  Procedure Laterality Date  . IR ANGIO EXTRACRAN SEL COM CAROTID INNOMINATE UNI R MOD SED  06/19/2017  . IR ANGIO VERTEBRAL SEL SUBCLAVIAN INNOMINATE UNI R MOD SED  06/19/2017  . IR GASTROSTOMY TUBE MOD SED  07/04/2017  . RADIOLOGY WITH ANESTHESIA N/A 06/19/2017   Procedure: RADIOLOGY WITH ANESTHESIA;  Surgeon: Deveshwar, Sanjeev, MD;  Location: [facility] OR;  Service: Radiology;  Laterality: N/A;    There were no vitals filed for this visit.  Subjective Assessment - 10/03/17 0949    Subjective  Engaged and talkative. He was able to explain himself in conversation and his intelligibility improved when he spoke in a louder voice.     Currently in Pain?  No/denies    Pain Score  0-No pain    Multiple Pain Sites  No            ADULT SLP TREATMENT - 10/03/17 0001      General Information   Behavior/Cognition  Alert;Cooperative;Pleasant mood;Distractible    HPI  77 year old man, with history of prior stroke with speech and language deficits, with right hemisphere CVA 06/19/2017 with profound dysarthria and severe oropharyngeal  dysphagia.  The patient received inpatient rehab services 06/23/2017 - 07/04/2017 and home health services on discharge.         Treatment Provided   Treatment provided  Dysphagia;Cognitive-Linquistic      Pain Assessment   Pain Assessment  No/denies pain      Cognitive-Linquistic Treatment   Treatment focused on  Dysarthria;Apraxia;Voice;Other (comment);Patient/family/caregiver education Dysphagia    Skilled Treatment  SWALLOWING:   Patient returned with completed log.   Today, the patient took 4 ounces of applesauce. He did not cough and he maintained strong and clear vocal quality between bites.  He had 7 tsp honey-thick liquid and maintained strong and clear vocal quality between bites.  PHONATION:  Patient able to imitate words/phrases, maintaining loudness for all words with 90% accuracy. When verbally prompted, he was able to increase his volume for approximately 6 words after the prompt.          Assessment / Recommendations / Plan   Plan  Continue with current plan of care      Progression Toward Goals   Progression toward goals  Progressing toward goals       SLP Education - 10/03/17 0950    Education Details  Therapist reminded him that he needed to eat more to

## 2017-10-04 ENCOUNTER — Ambulatory Visit: Payer: Medicare Other | Admitting: Physical Therapy

## 2017-10-04 ENCOUNTER — Ambulatory Visit: Payer: Medicare Other | Admitting: Speech Pathology

## 2017-10-04 ENCOUNTER — Ambulatory Visit: Payer: Medicare Other | Admitting: Occupational Therapy

## 2017-10-04 ENCOUNTER — Encounter: Payer: Self-pay | Admitting: Occupational Therapy

## 2017-10-04 ENCOUNTER — Encounter: Payer: Self-pay | Admitting: Physical Therapy

## 2017-10-04 DIAGNOSIS — R278 Other lack of coordination: Secondary | ICD-10-CM | POA: Diagnosis not present

## 2017-10-04 DIAGNOSIS — R1312 Dysphagia, oropharyngeal phase: Secondary | ICD-10-CM | POA: Diagnosis not present

## 2017-10-04 DIAGNOSIS — R471 Dysarthria and anarthria: Secondary | ICD-10-CM

## 2017-10-04 DIAGNOSIS — R4702 Dysphasia: Secondary | ICD-10-CM

## 2017-10-04 DIAGNOSIS — M6281 Muscle weakness (generalized): Secondary | ICD-10-CM

## 2017-10-04 DIAGNOSIS — R2681 Unsteadiness on feet: Secondary | ICD-10-CM | POA: Diagnosis not present

## 2017-10-04 NOTE — Therapy (Signed)
[place] [facility] [address] [place], [place], [address] Phone: [phone]   Fax:  [phone]  Occupational Therapy Treatment  Patient Details  Name: Ronald [name] MRN: [id number] Date of Birth: 05-23-1940 Referring Provider: Dr. Wynn BankerKirsteins   Encounter Date: 10/04/2017  OT End of Session - 10/04/17 1513    Visit Number  6    Number of Visits  24    Date for OT Re-Evaluation  11/28/17    Authorization Type  Visit 6 of 10 for progress report period starting 09/05/2017    OT Start Time  1500    OT Stop Time  1545    OT Time Calculation (min)  45 min    Activity Tolerance  Patient tolerated treatment well    Behavior During Therapy  [facility] for tasks assessed/performed       Past Medical History:  Diagnosis Date  . COPD (chronic obstructive pulmonary disease) (HCC)   . Emphysema lung (HCC)   . Hx of long term use of blood thinners   . Hypertension   . Stroke [facility](HCC) 2013  . Thyroid disease    hypothyroidism    Past Surgical History:  Procedure Laterality Date  . IR ANGIO EXTRACRAN SEL COM CAROTID INNOMINATE UNI R MOD SED  06/19/2017  . IR ANGIO VERTEBRAL SEL SUBCLAVIAN INNOMINATE UNI R MOD SED  06/19/2017  . IR GASTROSTOMY TUBE MOD SED  07/04/2017  . RADIOLOGY WITH ANESTHESIA N/A 06/19/2017   Procedure: RADIOLOGY WITH ANESTHESIA;  Surgeon: Julieanne Cottoneveshwar, Sanjeev, MD;  Location[facility] OR;  Service: Radiology;  Laterality: N/A;    There were no vitals filed for this visit.  Subjective Assessment - 10/04/17 1510    Subjective   Pt. conitnues to present with severe expressive comminucation deficits.    Patient is accompained by:  Family member    Pertinent History  Pt. is a 77 y.o. male who [facility] with a Right MCA Infarct with Lft Hemiparesis, and severe oropharyngeal dysphagia, and severe dysarthria.    Currently in Pain?  No/denies      OT TREATMENT    Neuro muscular re-education:  Pt. worked on grasping 1/4"  objects from the the putty with his left hand. Pt. required verbal and tactile cues for proper technique. Pt. performed [facility] tasks using the Grooved pegboard. Pt. worked on grasping the grooved pegs from a horizontal position, and moving the pegs to a vertical position in the hand to prepare for placing them in the grooved slot.   Therapeutic Exercise:  Pt. performed 2# dowel ex. For UE strengthening secondary to weakness. Bilateral shoulder flexion, chest press, circular patterns, and elbow flexion/extension were performed. Pt. Required verbal cues, and visual cues for the correct movement patterns, and to perform slowly. Pt. Performed hand strengthening with green theraputty. Pt. required cues for proper technique. Pt. worked on gross grip, lateral pinch, 3pt. pinch, and 2pt. Pinch.                       OT Education - 10/04/17 1512    Education provided  Yes    Education Details  [facility] skills, UE ther. ex.    Person(s) Educated  Patient    Methods  Explanation    Comprehension  Verbalized understanding          OT Long Term Goals - 09/05/17 1834      OT LONG TERM GOAL #1   Title  [address] [place], [place], [address] Phone: [phone]   Fax:  [phone]  Name: Ronald [name] MRN: [id number] Date of Birth: January 13, 1941  [place] [facility] [address] [place], [place], [address] Phone: [phone]   Fax:  [phone]  Occupational Therapy Treatment  Patient Details  Name: Ronald [name] MRN: [id number] Date of Birth: 05-23-1940 Referring Provider: Dr. Wynn BankerKirsteins   Encounter Date: 10/04/2017  OT End of Session - 10/04/17 1513    Visit Number  6    Number of Visits  24    Date for OT Re-Evaluation  11/28/17    Authorization Type  Visit 6 of 10 for progress report period starting 09/05/2017    OT Start Time  1500    OT Stop Time  1545    OT Time Calculation (min)  45 min    Activity Tolerance  Patient tolerated treatment well    Behavior During Therapy  [facility] for tasks assessed/performed       Past Medical History:  Diagnosis Date  . COPD (chronic obstructive pulmonary disease) (HCC)   . Emphysema lung (HCC)   . Hx of long term use of blood thinners   . Hypertension   . Stroke [facility](HCC) 2013  . Thyroid disease    hypothyroidism    Past Surgical History:  Procedure Laterality Date  . IR ANGIO EXTRACRAN SEL COM CAROTID INNOMINATE UNI R MOD SED  06/19/2017  . IR ANGIO VERTEBRAL SEL SUBCLAVIAN INNOMINATE UNI R MOD SED  06/19/2017  . IR GASTROSTOMY TUBE MOD SED  07/04/2017  . RADIOLOGY WITH ANESTHESIA N/A 06/19/2017   Procedure: RADIOLOGY WITH ANESTHESIA;  Surgeon: Julieanne Cottoneveshwar, Sanjeev, MD;  [facility];  Service: Radiology;  Laterality: N/A;    There were no vitals filed for this visit.  Subjective Assessment - 10/04/17 1510    Subjective   Pt. conitnues to present with severe expressive comminucation deficits.    Patient is accompained by:  Family member    Pertinent History  Pt. is a 77 y.o. male who was [facility] with a Right MCA Infarct with Lft Hemiparesis, and severe oropharyngeal dysphagia, and severe dysarthria.    Currently in Pain?  No/denies      OT TREATMENT    Neuro muscular re-education:  Pt. worked on grasping 1/4"  objects from the the putty with his left hand. Pt. required verbal and tactile cues for proper technique. Pt. performed [facility] tasks using the Grooved pegboard. Pt. worked on grasping the grooved pegs from a horizontal position, and moving the pegs to a vertical position in the hand to prepare for placing them in the grooved slot.   Therapeutic Exercise:  Pt. performed 2# dowel ex. For UE strengthening secondary to weakness. Bilateral shoulder flexion, chest press, circular patterns, and elbow flexion/extension were performed. Pt. Required verbal cues, and visual cues for the correct movement patterns, and to perform slowly. Pt. Performed hand strengthening with green theraputty. Pt. required cues for proper technique. Pt. worked on gross grip, lateral pinch, 3pt. pinch, and 2pt. Pinch.                       OT Education - 10/04/17 1512    Education provided  Yes    Education Details  [facility] skills, UE ther. ex.    Person(s) Educated  Patient    Methods  Explanation    Comprehension  Verbalized understanding          OT Long Term Goals - 09/05/17 1834      OT LONG TERM GOAL #1   Title

## 2017-10-04 NOTE — Therapy (Signed)
[place] MAIN [facility] SERVICES [place], [place], [address] Phone: [phone]   Fax:  [phone]  Physical Therapy Treatment/Discharge Summary  Patient Details  Name: Ronald [name] MRN: [id number] Date of Birth: February 04, 1941 Referring Provider: Dr. Letta Pate   Encounter Date: 10/04/2017  PT End of Session - 10/04/17 1555    Visit Number  5    Number of Visits  17    Date for PT Re-Evaluation  10/31/17    Authorization Type  progress note 5/10    PT Start Time  1551    PT Stop Time  1615    PT Time Calculation (min)  24 min    Equipment Utilized During Treatment  Gait belt    Activity Tolerance  Patient tolerated treatment well    Behavior During Therapy  [facility] for tasks assessed/performed       Past Medical History:  Diagnosis Date  . COPD (chronic obstructive pulmonary disease) ([place])   . Emphysema lung ([place])   . Hx of long term use of blood thinners   . Hypertension   . Stroke [facility]) 2013  . Thyroid disease    hypothyroidism    Past Surgical History:  Procedure Laterality Date  . IR ANGIO EXTRACRAN SEL COM CAROTID INNOMINATE UNI R MOD SED  06/19/2017  . IR ANGIO VERTEBRAL SEL SUBCLAVIAN INNOMINATE UNI R MOD SED  06/19/2017  . IR GASTROSTOMY TUBE MOD SED  07/04/2017  . RADIOLOGY WITH ANESTHESIA N/A 06/19/2017   Procedure: RADIOLOGY WITH ANESTHESIA;  Surgeon: Luanne Bras, MD;  Location: [place];  Service: Radiology;  Laterality: N/A;    There were no vitals filed for this visit.  Subjective Assessment - 10/04/17 1554    Subjective  Patient reports doing well; Reports that his balance and walking is pretty much like it was before.     Patient is accompained by:  Family member Son in Sports coach    Pertinent History  Large portions of history borrowed from MD note. Son-in-law is with patient upon arrival but unable to accurately recount history. Pt is only able to contribute minimally to history. 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 COPD with tobacco abuse, who presented to the hospital on June 20, 2017, with left-sided weakness. CT scan negative for acute changes, old left posterior frontal infarction. The patient did receive tPA. INR on admission of 1.23. CT angiogram of head and neck showed apparent occlusion of right insular anterior temporal M2 branch. CT cerebral perfusion scan showed acute changes in the right MCA territory compatible with infarction. Echocardiogram with ejection fraction 60%. No wall motion abnormalities. Carotid arteriogram on June 19, 2017, showed no acute occlusions, AV shunting, or aneurysm. The patient did require short-term intubation. MRI showed a 3.2 cm acute hemorrhage within the right lateral temporal lobe. CT of the head showed evolving multifocal right MCA territory infarct with evolving focal hemorrhagic conversion in the right temporal lobe. Aspirin for CVA prophylaxis was initially on hold as well as Coumadin. He lived with male companion and was mobile and independent prior to this most recent CVA. After he left the hospital he discharged to daughter and son-in-law's mobile home.    Limitations  Walking    Currently in Pain?  No/denies    Multiple Pain Sites  No         [facility] PT Assessment - 10/04/17 0001  2   Title  Pt will decrease 5TSTS by at least 3 seconds in order to demonstrate clinically significant improvement in LE strength    Baseline  09/05/17: 25.8s    Time  8    Period  Weeks    Status  Achieved      PT LONG TERM GOAL #3   Title  Pt will increase self-selected 65mgait speed by at least 0.13 m/s in order to demonstrate clinically significant improvement in community ambulation.     Baseline  09/05/17: 0.88 m/s    Time  8    Period  Weeks    Status  Achieved            Plan - 10/04/17 1805    Clinical Impression Statement  Patient is doing well. He is ambulating independently on even/uneven surfaces without difficulty. He exhibits low fall risk with 53/56 Berg Balance Assessment. in addition he is walking at a community ambulator speed. Patient denies any recent falls. He reports being independent in functional ADLs. He has met all goals and is appropriate for discharge from PT at this time.     Rehab Potential  Good    PT Frequency  2x / week    PT Duration  8 weeks    PT Treatment/Interventions  ADLs/Self Care Home Management;Aquatic Therapy;Canalith Repostioning;Cryotherapy;Electrical Stimulation;Iontophoresis 451mml Dexamethasone;DME Instruction;Moist Heat;Traction;Ultrasound;Gait training;Stair training;Therapeutic activities;Therapeutic exercise;Balance training;Neuromuscular re-education;Cognitive remediation;Patient/family education;Manual techniques;Passive range of motion;Energy conservation;Vestibular    PT Home Exercise Plan  semitandem balance, sit to stand    Consulted and Agree with Plan of Care  Patient;Family member/caregiver    Family Member  Consulted  Son-in-law       Patient will benefit from skilled therapeutic intervention in order to improve the following deficits and impairments:  Decreased balance, Decreased strength, Difficulty walking, Decreased safety awareness  Visit Diagnosis: Muscle weakness (generalized)  Other lack of coordination  Unsteadiness on feet     Problem List Patient Active Problem List   Diagnosis Date Noted  . PEG (percutaneous endoscopic gastrostomy) status ([place]  . Benign essential HTN   . Hypertensive crisis   . Dysarthria, post-stroke   . Acute pain of right shoulder   . Parietal lobe infarction ([place]CP[place]ville02/22/2019  . Dysphagia, oropharyngeal 06/23/2017  . Respiratory failure ([place]CL[place]  . Palliative care encounter   . Aspiration pneumonia due to gastric secretions ([place]CH[place]  . Atrial fibrillation ([place]  . Stroke (cerebrum) ([place]02/18/2019    Zitlali Primm PT, DPT 10/04/2017, 6:07 PM  [facility] [address]one: [phone] Fax:  [phone]: Ronald SPELMANRN: [id number] of Birth: [birth year]  2   Title  Pt will decrease 5TSTS by at least 3 seconds in order to demonstrate clinically significant improvement in LE strength    Baseline  09/05/17: 25.8s    Time  8    Period  Weeks    Status  Achieved      PT LONG TERM GOAL #3   Title  Pt will increase self-selected 65mgait speed by at least 0.13 m/s in order to demonstrate clinically significant improvement in community ambulation.     Baseline  09/05/17: 0.88 m/s    Time  8    Period  Weeks    Status  Achieved            Plan - 10/04/17 1805    Clinical Impression Statement  Patient is doing well. He is ambulating independently on even/uneven surfaces without difficulty. He exhibits low fall risk with 53/56 Berg Balance Assessment. in addition he is walking at a community ambulator speed. Patient denies any recent falls. He reports being independent in functional ADLs. He has met all goals and is appropriate for discharge from PT at this time.     Rehab Potential  Good    PT Frequency  2x / week    PT Duration  8 weeks    PT Treatment/Interventions  ADLs/Self Care Home Management;Aquatic Therapy;Canalith Repostioning;Cryotherapy;Electrical Stimulation;Iontophoresis 451mml Dexamethasone;DME Instruction;Moist Heat;Traction;Ultrasound;Gait training;Stair training;Therapeutic activities;Therapeutic exercise;Balance training;Neuromuscular re-education;Cognitive remediation;Patient/family education;Manual techniques;Passive range of motion;Energy conservation;Vestibular    PT Home Exercise Plan  semitandem balance, sit to stand    Consulted and Agree with Plan of Care  Patient;Family member/caregiver    Family Member  Consulted  Son-in-law       Patient will benefit from skilled therapeutic intervention in order to improve the following deficits and impairments:  Decreased balance, Decreased strength, Difficulty walking, Decreased safety awareness  Visit Diagnosis: Muscle weakness (generalized)  Other lack of coordination  Unsteadiness on feet     Problem List Patient Active Problem List   Diagnosis Date Noted  . PEG (percutaneous endoscopic gastrostomy) status ([place]CL[place]  . Benign essential HTN   . Hypertensive crisis   . Dysarthria, post-stroke   . Acute pain of right shoulder   . Parietal lobe infarction (HCPinardville02/22/2019  . Dysphagia, oropharyngeal 06/23/2017  . Respiratory failure (HCL[place]  . Palliative care encounter   . Aspiration pneumonia due to gastric secretions ([place]CH[place]  . Atrial fibrillation (HCSouth [place]  . Stroke (cerebrum) (HCWatergate02/18/2019    Zitlali Primm PT, DPT 10/04/2017, 6:07 PM  [facility] [address]one: [phone] Fax:  [phone]: Ronald SPELMANRN: [id number] of Birth: [birth year]

## 2017-10-05 ENCOUNTER — Encounter: Payer: Self-pay | Admitting: Speech Pathology

## 2017-10-05 NOTE — Therapy (Signed)
[facility] MAIN [facility] SERVICES [address] [place], [place], [address] Phone: [phone]   Fax:  [phone]  Speech Language Pathology Treatment  Patient Details  Name: KHRYSTIAN SCHMICK MRN: [id number] Date of Birth: [birth year] Referring Provider: [name]   Encounter Date: 10/04/2017  End of Session - 10/05/17 0910    Visit Number  14    Number of Visits  17    Date for SLP Re-Evaluation  10/13/17    SLP Start Time  1400    SLP Stop Time   1450    SLP Time Calculation (min)  50 min    Activity Tolerance  Patient tolerated treatment well       Past Medical History:  Diagnosis Date  . COPD (chronic obstructive pulmonary disease) (HCC)   . Emphysema lung (HCC)   . Hx of long term use of blood thinners   . Hypertension   . Stroke [facility]) 2013  . Thyroid disease    hypothyroidism    Past Surgical History:  Procedure Laterality Date  . IR ANGIO EXTRACRAN SEL COM CAROTID INNOMINATE UNI R MOD SED  06/19/2017  . IR ANGIO VERTEBRAL SEL SUBCLAVIAN INNOMINATE UNI R MOD SED  06/19/2017  . IR GASTROSTOMY TUBE MOD SED  07/04/2017  . RADIOLOGY WITH ANESTHESIA N/A 06/19/2017   Procedure: RADIOLOGY WITH ANESTHESIA;  Surgeon: Julieanne Cotton, MD;  Location: [facility] OR;  Service: Radiology;  Laterality: N/A;    There were no vitals filed for this visit.  Subjective Assessment - 10/05/17 0909    Subjective  . Engaged and talkative. He was able to explain himself in conversation and his intelligibility improved when he spoke in a louder voice.     Currently in Pain?  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  The patient  received inpatient rehab services 06/23/2017 - 07/04/2017 and home health services on discharge.         Treatment Provided   Treatment provided  Dysphagia;Cognitive-Linquistic      Pain Assessment   Pain Assessment  No/denies pain      Cognitive-Linquistic Treatment   Treatment focused on  Dysarthria;Apraxia;Voice;Other (comment);Patient/family/caregiver education Dysphagia    Skilled Treatment  SWALLOWING:   Patient returned with completed log.   Today, the patient took 4 ounces of applesauce. He did not cough and he maintained strong and clear vocal quality between bites.  He had 7 tsp honey-thick liquid and maintained strong and clear vocal quality between bites but coughed after the final spoonful.  PHONATION:  Patient able to imitate words/phrases, maintaining loudness for all words with 90% accuracy. When verbally prompted, he was able to increase his volume for approximately 6 words after the prompt      Assessment / Recommendations / Plan   Plan  Continue with current plan of care      Progression Toward Goals   Progression toward goals  Progressing toward goals       SLP Education - 10/05/17 0909    Education provided  Yes    Education Details  Therapist reminded him that he needed to eat  more to show that he can tolerate food and decrease the use of the PEG.     Person(s) Educated  Patient    Methods  Explanation    Comprehension  Verbalized understanding         SLP Long Term Goals - 09/28/17 1503      SLP LONG TERM GOAL #1   Title  Pt will improve speech intelligibility for words and phrases by controlling rate of speech, over-articulation, and increased loudness to achieve 80% intelligibility with min. SLP cues.    Status  Partially Met      SLP LONG TERM GOAL #2   Title  Patient will execute Swallowing and Voice Building HEP 5-7 days per week, with assistance from family as needed.    Status  On-going      SLP LONG TERM GOAL #4   Title  Patient / family will  track oral intake and report any notable observations    Status  Achieved       Plan - 10/05/17 0910    Clinical Impression Statement  Pt. is engaged and making progress. He is improving in his swallowing and clearance and continues to speak loudly during structured phonation activities. His intelligibility improves when he speaks louder.  His MBSS is scheduled for Wednesday 6/12.     Speech Therapy Frequency  2x / week    Duration  Other (comment) 8 wks    Treatment/Interventions  Pharyngeal strengthening exercises;Diet toleration management by SLP;Oral motor exercises;Compensatory strategies;SLP instruction and feedback;Patient/family education    Potential Considerations  Ability to learn/carryover information;Pain level;Family/community support;Co-morbidities;Previous level of function;Cooperation/participation level;Severity of impairments;Medical prognosis    SLP Home Exercise Plan  Respiratory support activities, voice activities, oral intake log, swallowing recommendations, and things to observe for.      Consulted and Agree with Plan of Care  Patient       Patient will benefit from skilled therapeutic intervention in order to improve the following deficits and impairments:   Dysphasia  Dysarthria and anarthria  Oropharyngeal dysphagia    Problem List Patient Active Problem List   Diagnosis Date Noted  . PEG (percutaneous endoscopic gastrostomy) status (HCC)   . Benign essential HTN   . Hypertensive crisis   . Dysarthria, post-stroke   . Acute pain of right shoulder   . 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 Dysphagia, oropharyngeal 06/23/2017  . Respiratory failure (HCC)   . Palliative care encounter   . Aspiration pneumonia due to gastric secretions (HCC)   . Atrial fibrillation (HCC)   . 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  [place], [place], [address] Phone: [phone]   Fax:  [phone]   Name: SIONE MEINHOLZ MRN: [id number] Date of Birth: [birth year]

## 2017-10-09 ENCOUNTER — Ambulatory Visit: Payer: Medicare Other | Admitting: Physical Therapy

## 2017-10-09 ENCOUNTER — Encounter: Payer: Self-pay | Admitting: Speech Pathology

## 2017-10-09 ENCOUNTER — Ambulatory Visit: Payer: Medicare Other | Admitting: Occupational Therapy

## 2017-10-09 ENCOUNTER — Encounter: Payer: Self-pay | Admitting: Occupational Therapy

## 2017-10-09 ENCOUNTER — Ambulatory Visit: Payer: Medicare Other | Admitting: Speech Pathology

## 2017-10-09 DIAGNOSIS — R471 Dysarthria and anarthria: Secondary | ICD-10-CM | POA: Diagnosis not present

## 2017-10-09 DIAGNOSIS — R1312 Dysphagia, oropharyngeal phase: Secondary | ICD-10-CM | POA: Diagnosis not present

## 2017-10-09 DIAGNOSIS — R278 Other lack of coordination: Secondary | ICD-10-CM

## 2017-10-09 DIAGNOSIS — R4702 Dysphasia: Secondary | ICD-10-CM | POA: Diagnosis not present

## 2017-10-09 DIAGNOSIS — M6281 Muscle weakness (generalized): Secondary | ICD-10-CM | POA: Diagnosis not present

## 2017-10-09 DIAGNOSIS — R2681 Unsteadiness on feet: Secondary | ICD-10-CM | POA: Diagnosis not present

## 2017-10-09 NOTE — Therapy (Signed)
[place] MAIN [facility] SERVICES [address] [place], [place], [address] Phone: [phone]   Fax:  [phone]  Speech Language Pathology Treatment  Patient Details  Name: Ronald [name] MRN: [id number] Date of Birth: Nov 22, 1940 Referring Provider: Charlett Blake   Encounter Date: 10/09/2017  End of Session - 10/09/17 1520    Visit Number  15    Number of Visits  17    Date for SLP Re-Evaluation  10/13/17    SLP Start Time  1400    SLP Stop Time   1455    SLP Time Calculation (min)  55 min    Activity Tolerance  Patient tolerated treatment well       Past Medical History:  Diagnosis Date  . COPD (chronic obstructive pulmonary disease) ([place])   . Emphysema lung ([place])   . Hx of long term use of blood thinners   . Hypertension   . Stroke [facility]) 2013  . Thyroid disease    hypothyroidism    Past Surgical History:  Procedure Laterality Date  . IR ANGIO EXTRACRAN SEL COM CAROTID INNOMINATE UNI R MOD SED  06/19/2017  . IR ANGIO VERTEBRAL SEL SUBCLAVIAN INNOMINATE UNI R MOD SED  06/19/2017  . IR GASTROSTOMY TUBE MOD SED  07/04/2017  . RADIOLOGY WITH ANESTHESIA N/A 06/19/2017   Procedure: RADIOLOGY WITH ANESTHESIA;  Surgeon: Luanne Bras, MD;  Location: [place];  Service: Radiology;  Laterality: N/A;    There were no vitals filed for this visit.  Subjective Assessment - 10/09/17 1519    Subjective  . Engaged and talkative. He was able to explain himself in conversation and his intelligibility improved when he spoke in a louder voice.     Currently in Pain?  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  The patient received inpatient rehab services 06/23/2017 - 07/04/2017 and home health services on discharge.         Treatment Provided   Treatment provided  Cognitive-Linquistic;Dysphagia      Pain Assessment   Pain Assessment  No/denies pain      Cognitive-Linquistic Treatment   Treatment focused on  Dysarthria;Apraxia;Voice;Other (comment);Patient/family/caregiver education Dysphagia    Skilled Treatment  SWALLOWING:   Patient returned with completed log.   Today, the patient took 4 ounces of applesauce. He did not cough and he maintained strong and clear vocal quality between bites.  He had 7 tsp honey-thick liquid and maintained strong and clear vocal quality between bites and had one large throat clear after the final spoonful.  PHONATION:  Patient able to imitate words/phrases, maintaining loudness for all words with 90% accuracy. When verbally prompted, he was able to increase his volume for approximately 6 words after the prompt      Assessment / Recommendations / [place] with current plan of care      Progression Toward Goals   Progression toward goals  Progressing toward goals       SLP Education - 10/09/17 1520    Education Details  Therapist reminded him  that he needed to eat more to show that he can tolerate food and decrease the use of the PEG.     Person(s) Educated  Patient    Methods  Explanation    Comprehension  Verbalized understanding         SLP Long Term Goals - 09/28/17 1503      SLP LONG TERM GOAL #1   Title  Pt will improve speech intelligibility for words and phrases by controlling rate of speech, over-articulation, and increased loudness to achieve 80% intelligibility with min. SLP cues.    Status  Partially Met      SLP LONG TERM GOAL #2   Title  Patient will execute Swallowing and Voice Building HEP 5-7 days per week, with assistance from family as needed.    Status  On-going      SLP LONG TERM GOAL #4   Title  Patient /  family will track oral intake and report any notable observations    Status  Achieved       Plan - 10/09/17 1520    Clinical Impression Statement  A: Pt. is engaged and making progress. He is improving in his swallowing and clearance and continues to speak loudly during structured phonation activities.  He is swallowing the 4 oz of applesauce and 7 tablespoons of honey thick liquid at a quicker and seemingly more effcient rate than previous weeks.  His intelligibility improves when he speaks louder.  His MBSS is scheduled for Wednesday 6/12.     Speech Therapy Frequency  2x / week    Duration  Other (comment) 8wks    Treatment/Interventions  Pharyngeal strengthening exercises;Diet toleration management by SLP;Oral motor exercises;Compensatory strategies;SLP instruction and feedback;Patient/family education    Potential to Achieve Goals  Good    Potential Considerations  Ability to learn/carryover information;Pain level;Family/community support;Co-morbidities;Previous level of function;Cooperation/participation level;Severity of impairments;Medical prognosis    Consulted and Agree with Plan of Care  Patient       Patient will benefit from skilled therapeutic intervention in order to improve the following deficits and impairments:   Muscle weakness (generalized)  Dysphasia  Oropharyngeal dysphagia    Problem List Patient Active Problem List   Diagnosis Date Noted  . PEG (percutaneous endoscopic gastrostomy) status ([place])   . Benign essential HTN   . Hypertensive crisis   . Dysarthria, post-stroke   . Acute pain of right shoulder   . Parietal lobe infarction ([place]) 06/23/2017  . Dysphagia, oropharyngeal 06/23/2017  . Respiratory failure ([place])   . Palliative care encounter   . Aspiration pneumonia due to gastric secretions ([place])   . Atrial fibrillation ([place])   . Stroke (cerebrum) ([place]) 06/19/2017    [name] 10/09/2017, 3:23 PM  [place] MAIN [facility] SERVICES [address] [place], [place], [address] Phone: [phone]   Fax:  [phone]   Name: Ronald [name] MRN: [id number] Date of Birth: 1941-02-03

## 2017-10-10 NOTE — Telephone Encounter (Signed)
Error

## 2017-10-11 ENCOUNTER — Ambulatory Visit: Payer: Medicare Other | Admitting: Occupational Therapy

## 2017-10-11 ENCOUNTER — Ambulatory Visit: Payer: Medicare Other | Admitting: Speech Pathology

## 2017-10-11 ENCOUNTER — Ambulatory Visit: Payer: Medicare Other | Admitting: Physical Therapy

## 2017-10-11 ENCOUNTER — Ambulatory Visit
Admission: RE | Admit: 2017-10-11 | Discharge: 2017-10-11 | Disposition: A | Discharge status: Home / Self Care | Payer: Medicare Other | Source: Ambulatory Visit | Attending: Physical Medicine & Rehabilitation | Admitting: Physical Medicine & Rehabilitation

## 2017-10-11 DIAGNOSIS — I69891 Dysphagia following other cerebrovascular disease: Secondary | ICD-10-CM | POA: Diagnosis not present

## 2017-10-11 DIAGNOSIS — E039 Hypothyroidism, unspecified: Secondary | ICD-10-CM | POA: Diagnosis not present

## 2017-10-11 DIAGNOSIS — R2681 Unsteadiness on feet: Secondary | ICD-10-CM | POA: Diagnosis not present

## 2017-10-11 DIAGNOSIS — I69822 Dysarthria following other cerebrovascular disease: Secondary | ICD-10-CM | POA: Diagnosis not present

## 2017-10-11 DIAGNOSIS — R1312 Dysphagia, oropharyngeal phase: Secondary | ICD-10-CM | POA: Insufficient documentation

## 2017-10-11 DIAGNOSIS — R4702 Dysphasia: Secondary | ICD-10-CM | POA: Diagnosis not present

## 2017-10-11 DIAGNOSIS — I10 Essential (primary) hypertension: Secondary | ICD-10-CM | POA: Insufficient documentation

## 2017-10-11 DIAGNOSIS — R278 Other lack of coordination: Secondary | ICD-10-CM | POA: Diagnosis not present

## 2017-10-11 DIAGNOSIS — J439 Emphysema, unspecified: Secondary | ICD-10-CM | POA: Insufficient documentation

## 2017-10-11 DIAGNOSIS — M6281 Muscle weakness (generalized): Secondary | ICD-10-CM

## 2017-10-11 DIAGNOSIS — I69322 Dysarthria following cerebral infarction: Secondary | ICD-10-CM | POA: Diagnosis not present

## 2017-10-11 DIAGNOSIS — R471 Dysarthria and anarthria: Secondary | ICD-10-CM | POA: Diagnosis not present

## 2017-10-11 DIAGNOSIS — Z7901 Long term (current) use of anticoagulants: Secondary | ICD-10-CM | POA: Diagnosis not present

## 2017-10-11 DIAGNOSIS — I69828 Other speech and language deficits following other cerebrovascular disease: Secondary | ICD-10-CM | POA: Diagnosis not present

## 2017-10-11 DIAGNOSIS — R1310 Dysphagia, unspecified: Secondary | ICD-10-CM | POA: Diagnosis not present

## 2017-10-11 NOTE — Therapy (Signed)
[facility] DIAGNOSTIC RADIOLOGY [address] [place], [place], [address] Phone: [phone]   Fax:     Modified Barium Swallow  Patient Details  Name: Ronald [name] MRN: [id number] Date of Birth: [birth year] Referring Provider: [name]   Encounter Date: 10/11/2017  End of Session - 10/11/17 1529    Visit Number  1    Number of Visits  1    Date for SLP Re-Evaluation  10/11/17    SLP Start Time  1400    SLP Stop Time   1500    SLP Time Calculation (min)  60 min    Activity Tolerance  Patient tolerated treatment well       Past Medical History:  Diagnosis Date  . COPD (chronic obstructive pulmonary disease) (HCC)   . Emphysema lung (HCC)   . Hx of long term use of blood thinners   . Hypertension   . Stroke [facility]) 2013  . Thyroid disease    hypothyroidism    Past Surgical History:  Procedure Laterality Date  . IR ANGIO EXTRACRAN SEL COM CAROTID INNOMINATE UNI R MOD SED  06/19/2017  . IR ANGIO VERTEBRAL SEL SUBCLAVIAN INNOMINATE UNI R MOD SED  06/19/2017  . IR GASTROSTOMY TUBE MOD SED  07/04/2017  . RADIOLOGY WITH ANESTHESIA N/A 06/19/2017   Procedure: RADIOLOGY WITH ANESTHESIA;  Surgeon: Julieanne Cotton, MD;  Location[facility] OR;  Service: Radiology;  Laterality: N/A;    There were no vitals filed for this visit.   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  The patient received inpatient rehab services 06/23/2017 - 07/04/2017 and home health services on discharge.    MBS 09/06/2017 with recommendation to start slow PO trials, puree with honey-thick liquids.  Patient has been taking at PO daily since that study and maintaining pulmonary health.  Objective:  Radiological Procedure: A videoflouroscopic evaluation of oral-preparatory, reflex initiation, and pharyngeal phases of the swallow was  performed; as well as a screening of the upper esophageal phase.  I. POSTURE: Upright in MBS chair  II. VIEW: Lateral  III. COMPENSATORY STRATEGIES: double swallow effective in reducing pharyngeal residue  IV. BOLUSES ADMINISTERED:   Thin Liquid: 2 spoon presentations, 1 self administered cup rim   Nectar-thick Liquid: 2 spoon presentations, 1 self administered cup rim   Honey-thick Liquid: 2 teaspoon presentations   Puree: 2 teaspoon presentations   Mechanical Soft: 1/4 graham cracker in applesauce  V. RESULTS OF EVALUATION: A. ORAL PREPARATORY PHASE: (The lips, tongue, and velum are observed for strength and coordination)       **Overall Severity Rating: Moderate; slow/disorganized oral management, inefficient but effective mastication of a graham cracker in applesauce, pre-swallow spill into the pharynx, oral residue post swallow  B. SWALLOW INITIATION/REFLEX: (The reflex is normal if "triggered" by the time the bolus reached the base of the tongue)  **Overall Severity Rating: Moderate; triggers while falling from the valleculae to the pyriform sinuses with solids and at the pyriform sinuses with liquids  C. PHARYNGEAL PHASE: (Pharyngeal function is normal if the bolus shows rapid, smooth, and continuous transit through the pharynx and there is no pharyngeal residue after the swallow)  **Overall Severity Rating: Mild+; mild residue throughout pharynx, occasional moderate amount with spontaneous dry swallow clearing to mild  D. LARYNGEAL PENETRATION: (Material entering into the laryngeal inlet/vestibule but not aspirated) None  E. ASPIRATION: X1  Audible, during large thin liquid sip.  Cough appeared to clear barium from trachea   F. ESOPHAGEAL PHASE: (Screening of the upper esophagus) no abnormality in the viewable cervical esophagus  ASSESSMENT: Ronald [name] is presenting with mild-moderate oropharyngeal dysphagia characterized by slow/disorganized oral management, inefficient  but effective mastication of a graham cracker in applesauce, pre-swallow spill into the pharynx, oral residue post swallow, delayed swallow initiation, decreased tongue base retraction, decreased epiglottic inversion, mild pharyngeal residue (mild-moderate with larger boluses), and one occurrence of audible aspiration (large, self-administered thin liquid).  The patient demonstrated spontaneous and effective cough with aspiration.   A second, dry swallow is effective in reducing pharyngeal residue. The patient was not able to swallow a barium tablet embedded in moist puree (applesauce).  Although only one episode frank aspiration was observed during this study, given the extent of pharyngeal residue across consistencies, the patient is at risk for at least trace aspiration.  In view of the patient's extensive history of dysphagia and known risk for aspiration, it is difficult to determine the patient's risk for negative sequelae to aspiration.   This study supports a dysphagia 3 diet with nectar thick liquids.  The patient should be closely monitored for signs/symptoms of pneumonia and remain as physically active as possible.    PLAN/RECOMMENDATIONS:   A. Diet: Dysphagia 3 with nectar-thick liquid   B. Swallowing Precautions: Continue lingual sweeps and frequent dry swallows, crush meds in puree   C. Recommended consultation to   D. Therapy recommendations: continue ST for high effort/high intensity vocal exercises and thin liquid PO trials   E. Results and recommendations were discussed with the patient and his daughter and the final report routed to his MD.           Patient will benefit from skilled therapeutic intervention in order to improve the following deficits and impairments:   Oropharyngeal dysphagia - Plan: DG OP Swallowing Func-Medicare/Speech Path, DG OP Swallowing Func-Medicare/Speech Path        Problem List Patient Active Problem List   Diagnosis Date Noted  . PEG  (percutaneous endoscopic gastrostomy) status (HCC)   . Benign essential HTN   . Hypertensive crisis   . Dysarthria, post-stroke   . Acute pain of right shoulder   . 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 Dysphagia, oropharyngeal 06/23/2017  . Respiratory failure (HCC)   . Palliative care encounter   . Aspiration pneumonia due to gastric secretions (HCC)   . Atrial fibrillation (HCC)   . 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 DIAGNOSTIC RADIOLOGY [address] [place], [place], [address] Phone: [phone]   Fax:     Name: Ronald [name] MRN: [id number] Date of Birth: 08-Apr-1941

## 2017-10-14 NOTE — Addendum Note (Signed)
Encounter addended by: Hulan FessWatson, Katherine D, CCC-SLP on: 10/14/2017 3:10 PM  Actions taken: Sign clinical note

## 2017-10-15 ENCOUNTER — Encounter: Payer: Self-pay | Admitting: Occupational Therapy

## 2017-10-15 NOTE — Therapy (Signed)
[facility] MAIN [facility] SERVICES [address] [place], [place], [address] Phone: [phone]   Fax:  [phone]  Occupational Therapy Treatment  Patient Details  Name: Ronald [name] MRN: [id number] Date of Birth: 1940-11-17 Referring Provider: Dr. Wynn Banker   Encounter Date: 10/11/2017  OT End of Session - 10/15/17 2240    Visit Number  8    Number of Visits  24    Date for OT Re-Evaluation  11/28/17    Authorization Type  Visit 8 of 10 for progress report period starting 09/05/2017    OT Start Time  1300    OT Stop Time  1345    OT Time Calculation (min)  45 min    Activity Tolerance  Patient tolerated treatment well    Behavior During Therapy  [facility] for tasks assessed/performed       Past Medical History:  Diagnosis Date  . COPD (chronic obstructive pulmonary disease) (HCC)   . Emphysema lung (HCC)   . Hx of long term use of blood thinners   . Hypertension   . Stroke [facility]) 2013  . Thyroid disease    hypothyroidism    Past Surgical History:  Procedure Laterality Date  . IR ANGIO EXTRACRAN SEL COM CAROTID INNOMINATE UNI R MOD SED  06/19/2017  . IR ANGIO VERTEBRAL SEL SUBCLAVIAN INNOMINATE UNI R MOD SED  06/19/2017  . IR GASTROSTOMY TUBE MOD SED  07/04/2017  . RADIOLOGY WITH ANESTHESIA N/A 06/19/2017   Procedure: RADIOLOGY WITH ANESTHESIA;  Surgeon: Julieanne Cotton, MD;  [facility]: [facility] OR;  Service: Radiology;  Laterality: N/A;    There were no vitals filed for this visit.  Subjective Assessment - 10/15/17 2235    Subjective   Patient smiling and indicating he is ready to get started with therapy today.     Pertinent History  Pt. is a 77 y.o. male who was admitted to [facility] with a Right MCA Infarct with Lft Hemiparesis, and severe oropharyngeal dysphagia, and severe dysarthria.    Patient Stated Goals  Patient would like to be as independent as possible.     Currently in Pain?  No/denies    Pain Score  0-No pain                    OT Treatments/Exercises (OP) - 10/15/17 2236      Neurological Re-education Exercises   Other Exercises 1  Patient seen for UE strenthening with UBE, resistance of 3.8 to 4.0 , alternating level of resistance, forwards and backwards with therapist in constant attendance to adjust settings and ensure grip.  Patient performing ROM and strengthening with 1# weight to left UE, able to complete all 4 levels with cues and occasional therapist demonstration.      Other Exercises 2  Patient seen for manipulation of pieces from [employer] pegboard with dowels, washers and collars with left hand with verbal cues.              OT Education - 10/15/17 2240    Education provided  Yes    Education Details  fine motor coordination skills    Person(s) Educated  Patient    Methods  Explanation;Demonstration    Comprehension  Verbalized understanding;Returned demonstration;Verbal cues required          OT Long Term Goals - 09/05/17 1834      OT LONG TERM GOAL #1   Title  Pt. will demonstrate independence with visual compensatory strategies during ADL,  and IADL tasks.    Baseline  Eval: Visual impairment present. Vision to be further assessed to determine how it may be affecting ADL/IADL functioning.    Time  12    Period  Weeks    Status  New    Target Date  11/28/17      OT LONG TERM GOAL #2   Title  Pt. will increase Left grip strength will increase by 10# to improve ADL/IADL functioning.    Baseline  Eval: Limited LUE grip strength    Time  12    Period  Weeks    Status  New    Target Date  11/28/17      OT LONG TERM GOAL #3   Title  Pt. will improve FMC skills by 5 sec. of speed to be able to manipulate ADL/IADL objects.    Baseline  Eval: Limited left hand coordination skills.    Time  12    Period  Weeks    Status  New    Target Date  11/28/17      OT LONG TERM GOAL #4   Title  Pt. will accurately identify 10/10 home safety hazards for ADLs, and  IADLs    Baseline  EVal: Limited.    Time  12    Period  Weeks    Status  New    Target Date  11/28/17            Plan - 10/15/17 2241    Clinical Impression Statement  Patient able to follow verbal cues and therapist demonstration for exercises.  ROM and strength improving as well as fine motor coordination skills.  He does require cues for motor planning at times.     Occupational performance deficits (Please refer to evaluation for details):  ADL's;IADL's    Rehab Potential  Poor    Current Impairments/barriers affecting progress:  Positive Indicators: age, family support, Negative Indicators: multiple comorbidities    OT Frequency  2x / week    OT Duration  12 weeks    OT Treatment/Interventions  Self-care/ADL training;Therapeutic exercise;Therapeutic activities;Neuromuscular education;DME and/or AE instruction;Patient/family education;Energy conservation    Consulted and Agree with Plan of Care  Patient       Patient will benefit from skilled therapeutic intervention in order to improve the following deficits and impairments:  Impaired UE functional use, Decreased activity tolerance, Decreased endurance, Decreased coordination, Impaired vision/preception, Decreased cognition, Decreased strength  Visit Diagnosis: Muscle weakness (generalized)  Other lack of coordination    Problem List Patient Active Problem List   Diagnosis Date Noted  . PEG (percutaneous endoscopic gastrostomy) status (HCC)   . Benign essential HTN   . Hypertensive crisis   . Dysarthria, post-stroke   . Acute pain of right shoulder   . 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 Dysphagia, oropharyngeal 06/23/2017  . Respiratory failure (HCC)   . Palliative care encounter   . Aspiration pneumonia due to gastric secretions (HCC)   . Atrial fibrillation (HCC)   . Stroke (cerebrum) [facility]) 06/19/2017   Ronald [name], OTR/L, CLT  Roberta Angell 10/15/2017, 10:43 PM  [facility] MAIN [facility] SERVICES [address] [place], [place], [address] Phone: [phone]   Fax:  [phone]  Name: Ronald [name] MRN: [id number] Date of Birth: 1940-09-17

## 2017-10-15 NOTE — Therapy (Signed)
[facility] MAIN [facility] SERVICES [address] [place], [place], [address] Phone: [phone]   Fax:  [phone]  Occupational Therapy Treatment  Patient Details  Name: Ronald [name] MRN: [id number] Date of Birth: 01/12/1941 Referring Provider: Dr. Wynn Banker   Encounter Date: 10/09/2017  OT End of Session - 10/15/17 2148    Visit Number  7    Number of Visits  24    Date for OT Re-Evaluation  11/28/17    Authorization Type  Visit 7 of 10 for progress report period starting 09/05/2017    OT Start Time  1315    OT Stop Time  1400    OT Time Calculation (min)  45 min    Activity Tolerance  Patient tolerated treatment well    Behavior During Therapy  [facility] for tasks assessed/performed       Past Medical History:  Diagnosis Date  . COPD (chronic obstructive pulmonary disease) (HCC)   . Emphysema lung (HCC)   . Hx of long term use of blood thinners   . Hypertension   . Stroke [facility]) 2013  . Thyroid disease    hypothyroidism    Past Surgical History:  Procedure Laterality Date  . IR ANGIO EXTRACRAN SEL COM CAROTID INNOMINATE UNI R MOD SED  06/19/2017  . IR ANGIO VERTEBRAL SEL SUBCLAVIAN INNOMINATE UNI R MOD SED  06/19/2017  . IR GASTROSTOMY TUBE MOD SED  07/04/2017  . RADIOLOGY WITH ANESTHESIA N/A 06/19/2017   Procedure: RADIOLOGY WITH ANESTHESIA;  Surgeon: Julieanne Cotton, MD;  Location: [facility] OR;  Service: Radiology;  Laterality: N/A;    There were no vitals filed for this visit.  Subjective Assessment - 10/15/17 2143    Subjective   Patient has expressive difficulties but is able to confirm information and nod head yes and no.  Reports he lives with his daughter.     Pertinent History  Pt. is a 77 y.o. male who was admitted to [facility] with a Right MCA Infarct with Lft Hemiparesis, and severe oropharyngeal dysphagia, and severe dysarthria.    Patient Stated Goals  Patient would like to be as independent as possible.     Currently in Pain?   No/denies    Pain Score  0-No pain                   OT Treatments/Exercises (OP) - 10/15/17 2143      Neurological Re-education Exercises   Other Exercises 1  Patient seen for UE strengthening with 5# dowel for shoulder flexion, ABD/ADD, chest press, forwards and backwards circles for 2 sets of 10 reps each with verbal cues and therapist demonstration for form and technique. Patient performed resistive pinch skills, all levels from yellow to black with cues for placing onto elevated surface to encourage reach on Left.      Other Exercises 2  Patient seen for manipulation skills with left hand, needed bank adjusted on table and cues to place coins to the left side.  Patient able to pick up from a flat surface but requires cues for translatory movements of the hand as well as using the hand for storage.  Patient seen for manipulation of [place] discs with moderate verbal cues and demonstration.  Slow to complete task.  Completed one set with left hand, then bilateral UEs at once.              OT Education - 10/15/17 2148    Education provided  Yes  Education Details  HEP for strengthening and fine motor coordination skills    Person(s) Educated  Patient    Methods  Explanation;Demonstration    Comprehension  Verbalized understanding;Returned demonstration;Verbal cues required          OT Long Term Goals - 09/05/17 1834      OT LONG TERM GOAL #1   Title  Pt. will demonstrate independence with visual compensatory strategies during ADL, and IADL tasks.    Baseline  Eval: Visual impairment present. Vision to be further assessed to determine how it may be affecting ADL/IADL functioning.    Time  12    Period  Weeks    Status  New    Target Date  11/28/17      OT LONG TERM GOAL #2   Title  Pt. will increase Left grip strength will increase by 10# to improve ADL/IADL functioning.    Baseline  Eval: Limited LUE grip strength    Time  12    Period  Weeks    Status   New    Target Date  11/28/17      OT LONG TERM GOAL #3   Title  Pt. will improve FMC skills by 5 sec. of speed to be able to manipulate ADL/IADL objects.    Baseline  Eval: Limited left hand coordination skills.    Time  12    Period  Weeks    Status  New    Target Date  11/28/17      OT LONG TERM GOAL #4   Title  Pt. will accurately identify 10/10 home safety hazards for ADLs, and IADLs    Baseline  EVal: Limited.    Time  12    Period  Weeks    Status  New    Target Date  11/28/17            Plan - 10/15/17 2149    Clinical Impression Statement  Patient required cues for attending to left side at times.  Verbal cues and therapist demo for picking up and manipulating coins from flat surface.  Cues for translatory skills and using the hand for storage.  Patient does well with mirrored demonstration for most tasks.  Continues to progress in all areas to increase independence in daily tasks.     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    Occupational performance deficits (Please refer to evaluation for details):  ADL's;IADL's    Rehab Potential  Poor    Current Impairments/barriers affecting progress:  Positive Indicators: age, family support, Negative Indicators: multiple comorbidities    OT Frequency  2x / week    OT Duration  12 weeks    OT Treatment/Interventions  Self-care/ADL training;Therapeutic exercise;Therapeutic activities;Neuromuscular education;DME and/or AE instruction;Patient/family education;Energy conservation    Consulted and Agree with Plan of Care  Patient       Patient will benefit from skilled therapeutic intervention in order to improve the following deficits and impairments:  Impaired UE functional use, Decreased activity tolerance, Decreased endurance, Decreased coordination, Impaired vision/preception, Decreased cognition, Decreased strength  Visit Diagnosis: Muscle weakness (generalized)  Other lack of  coordination    Problem List Patient Active Problem List   Diagnosis Date Noted  . PEG (percutaneous endoscopic gastrostomy) status (HCC)   . Benign essential HTN   . Hypertensive crisis   . Dysarthria, post-stroke   . Acute pain of right shoulder   . 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  Dysphagia, oropharyngeal 06/23/2017  . Respiratory failure (HCC)   . Palliative care encounter   . Aspiration pneumonia due to gastric secretions (HCC)   . Atrial fibrillation (HCC)   . Stroke (cerebrum) [facility]) 06/19/2017   Angell Pincock T Pinkney Venard, OTR/L, CLT  Shuntay Everetts 10/15/2017, 9:52 PM  [facility] MAIN [facility] SERVICES [address] [place], [place], [address] Phone: [phone]   Fax:  [phone]  Name: JEFERSON REYMOND MRN: [id number] Date of Birth: 01-Mar-1941

## 2017-10-16 ENCOUNTER — Ambulatory Visit: Payer: Medicare Other | Admitting: Occupational Therapy

## 2017-10-16 ENCOUNTER — Encounter: Payer: Self-pay | Admitting: Occupational Therapy

## 2017-10-16 ENCOUNTER — Ambulatory Visit: Payer: Medicare Other | Admitting: Speech Pathology

## 2017-10-16 ENCOUNTER — Ambulatory Visit: Payer: Medicare Other | Admitting: Physical Therapy

## 2017-10-16 DIAGNOSIS — R4702 Dysphasia: Secondary | ICD-10-CM

## 2017-10-16 DIAGNOSIS — M6281 Muscle weakness (generalized): Secondary | ICD-10-CM | POA: Diagnosis not present

## 2017-10-16 DIAGNOSIS — R278 Other lack of coordination: Secondary | ICD-10-CM | POA: Diagnosis not present

## 2017-10-16 DIAGNOSIS — R1312 Dysphagia, oropharyngeal phase: Secondary | ICD-10-CM | POA: Diagnosis not present

## 2017-10-16 DIAGNOSIS — R471 Dysarthria and anarthria: Secondary | ICD-10-CM | POA: Diagnosis not present

## 2017-10-16 DIAGNOSIS — R2681 Unsteadiness on feet: Secondary | ICD-10-CM | POA: Diagnosis not present

## 2017-10-16 NOTE — Therapy (Addendum)
[facility] MAIN [facility] SERVICES [address] [place], [place], [address] Phone: [phone]   Fax:  [phone]  Occupational Therapy Treatment  Patient Details  Name: Ronald [name] MRN: [id number] Date of Birth: 1940-12-20 Referring Provider: Dr. Wynn Banker   Encounter Date: 10/16/2017  OT End of Session - 10/16/17 1319    Visit Number  9    Number of Visits  24    Date for OT Re-Evaluation  11/28/17    Authorization Type  Visit 9 of 10 for progress report period starting 09/05/2017    OT Start Time  1300    OT Stop Time  1345    OT Time Calculation (min)  45 min    Activity Tolerance  Patient tolerated treatment well    Behavior During Therapy  [facility] for tasks assessed/performed       Past Medical History:  Diagnosis Date  . COPD (chronic obstructive pulmonary disease) (HCC)   . Emphysema lung (HCC)   . Hx of long term use of blood thinners   . Hypertension   . Stroke [facility]) 2013  . Thyroid disease    hypothyroidism    Past Surgical History:  Procedure Laterality Date  . IR ANGIO EXTRACRAN SEL COM CAROTID INNOMINATE UNI R MOD SED  06/19/2017  . IR ANGIO VERTEBRAL SEL SUBCLAVIAN INNOMINATE UNI R MOD SED  06/19/2017  . IR GASTROSTOMY TUBE MOD SED  07/04/2017  . RADIOLOGY WITH ANESTHESIA N/A 06/19/2017   Procedure: RADIOLOGY WITH ANESTHESIA;  Surgeon: Julieanne Cotton, MD;  Location: [facility] OR;  Service: Radiology;  Laterality: N/A;    There were no vitals filed for this visit.  Subjective Assessment - 10/16/17 1317    Subjective   Pt. answers questions using nodding gesture.    Patient is accompained by:  Family member    Pertinent History  Pt. is a 77 y.o. male who was admitted to [facility] with a Right MCA Infarct with Lft Hemiparesis, and severe oropharyngeal dysphagia, and severe dysarthria.    Currently in Pain?  No/denies      OT TREATMENT    Neuro muscular re-education:  Pt. worked on [facility] skills manipulating nuts, and bolts  on a bolt board. Pt. worked on screwing, and unscrewing nuts, and bolts of varying sizes, and challenging progressively smaller items. Pt. worked on UE motor control, and coordination skills while handling, and using tools. Pt. worked on Public librarian, Patent attorney, and screw drivers to remove screws, and bolts of varying sizes.   Therapeutic Exercise:  Pt. worked on the [employer] for 8 min. with constant monitoring of the BUEs. Pt. worked on changing, and alternating forward reverse position every 2 min. rest breaks were required.                          OT Education - 10/16/17 1318    Education provided  Yes    Education Details  fine motor coordination skills    Person(s) Educated  Patient    Methods  Explanation;Demonstration    Comprehension  Verbalized understanding;Returned demonstration;Verbal cues required          OT Long Term Goals - 09/05/17 1834      OT LONG TERM GOAL #1   Title  Pt. will demonstrate independence with visual compensatory strategies during ADL, and IADL tasks.    Baseline  Eval: Visual impairment present. Vision to be further assessed to determine how it  may be affecting ADL/IADL functioning.    Time  12    Period  Weeks    Status  New    Target Date  11/28/17      OT LONG TERM GOAL #2   Title  Pt. will increase Left grip strength will increase by 10# to improve ADL/IADL functioning.    Baseline  Eval: Limited LUE grip strength    Time  12    Period  Weeks    Status  New    Target Date  11/28/17      OT LONG TERM GOAL #3   Title  Pt. will improve FMC skills by 5 sec. of speed to be able to manipulate ADL/IADL objects.    Baseline  Eval: Limited left hand coordination skills.    Time  12    Period  Weeks    Status  New    Target Date  11/28/17      OT LONG TERM GOAL #4   Title  Pt. will accurately identify 10/10 home safety hazards for ADLs, and IADLs    Baseline  EVal: Limited.    Time  12    Period  Weeks     Status  New    Target Date  11/28/17            Plan - 10/16/17 1319    Clinical Impression Statement Pt. continues to use gestures nodding, or shaking his head to accurately respond to questions. Pt. continues to present with severe expressive communication deficits. Pt. requires visual demonstration, visual cues, and verbal cues for proper technique. Pt. continues to work on improving BUE strength, and coordination skills for improved ADL, and IADL functioning.      Occupational Profile and client history currently impacting functional performance  Pt. resides with his daughter.    Occupational performance deficits (Please refer to evaluation for details):  ADL's;IADL's    Rehab Potential  Poor    Current Impairments/barriers affecting progress:  Positive Indicators: age, family support, Negative Indicators: multiple comorbidities    OT Frequency  2x / week    OT Duration  12 weeks    OT Treatment/Interventions  Self-care/ADL training;Therapeutic exercise;Therapeutic activities;Neuromuscular education;DME and/or AE instruction;Patient/family education;Energy conservation    Clinical Decision Making  Several treatment options, min-mod task modification necessary    Consulted and Agree with Plan of Care  Patient       Patient will benefit from skilled therapeutic intervention in order to improve the following deficits and impairments:  Impaired UE functional use, Decreased activity tolerance, Decreased endurance, Decreased coordination, Impaired vision/preception, Decreased cognition, Decreased strength  Visit Diagnosis: Muscle weakness (generalized)  Other lack of coordination    Problem List Patient Active Problem List   Diagnosis Date Noted  . PEG (percutaneous endoscopic gastrostomy) status (HCC)   . Benign essential HTN   . Hypertensive crisis   . Dysarthria, post-stroke   . Acute pain of right shoulder   . 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 Dysphagia,  oropharyngeal 06/23/2017  . Respiratory failure (HCC)   . Palliative care encounter   . Aspiration pneumonia due to gastric secretions (HCC)   . Atrial fibrillation (HCC)   . 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    Olegario Messier, MS, OTR/L 10/16/2017, 1:34 PM  [facility] MAIN [facility] SERVICES [address] [place], [place], [address] Phone: [phone]   Fax:  [phone]  Name: Ronald [name] MRN: [id number] Date of Birth: [birth year]

## 2017-10-17 ENCOUNTER — Encounter: Payer: Self-pay | Admitting: Speech Pathology

## 2017-10-17 NOTE — Therapy (Signed)
[place] MAIN [facility] SERVICES [address] [place], [place], [address] Phone: [phone]   Fax:  [phone]  Speech Language Pathology Treatment/Recertification  Patient Details  Name: Ronald [name] MRN: [id number] Date of Birth: [birth year] Referring Provider: Charlett Blake   Encounter Date: 10/16/2017  End of Session - 10/17/17 0848    Visit Number  16    Number of Visits  33    Date for SLP Re-Evaluation  12/16/17    SLP Start Time  1400    SLP Stop Time   1450    SLP Time Calculation (min)  50 min    Activity Tolerance  Patient tolerated treatment well       Past Medical History:  Diagnosis Date  . COPD (chronic obstructive pulmonary disease) ([place])   . Emphysema lung ([place])   . Hx of long term use of blood thinners   . Hypertension   . Stroke [facility]) 2013  . Thyroid disease    hypothyroidism    Past Surgical History:  Procedure Laterality Date  . IR ANGIO EXTRACRAN SEL COM CAROTID INNOMINATE UNI R MOD SED  06/19/2017  . IR ANGIO VERTEBRAL SEL SUBCLAVIAN INNOMINATE UNI R MOD SED  06/19/2017  . IR GASTROSTOMY TUBE MOD SED  07/04/2017  . RADIOLOGY WITH ANESTHESIA N/A 06/19/2017   Procedure: RADIOLOGY WITH ANESTHESIA;  Surgeon: Luanne Bras, MD;  Location: [facility];  Service: Radiology;  Laterality: N/A;    There were no vitals filed for this visit.  Subjective Assessment - 10/17/17 0847    Subjective  Engaged and talkative. He was able to explain himself in conversation and his intelligibility improved when he spoke in a louder voice.     Currently in Pain?  No/denies    Pain Score  0-No pain    Multiple Pain Sites  No            ADULT SLP TREATMENT - 10/17/17 0001      General Information   Behavior/Cognition  Alert;Cooperative;Pleasant mood;Distractible    HPI  77 year old man, with history of prior stroke with speech and language deficits, with right hemisphere CVA 06/19/2017 with profound dysarthria and severe  oropharyngeal dysphagia.  The patient received inpatient rehab services 06/23/2017 - 07/04/2017 and home health services on discharge.         Treatment Provided   Treatment provided  Dysphagia;Cognitive-Linquistic      Pain Assessment   Pain Assessment  No/denies pain      Cognitive-Linquistic Treatment   Treatment focused on  Dysarthria;Apraxia;Voice;Other (comment);Patient/family/caregiver education Dysphagia    Skilled Treatment   SWALLOWING:   Patient returned with completed log.   Today, the patient took 3 ounces of thin liquid, water, using spoon fulls. He had 4 episodes of throat clearing and coughed 3 times and was able to clear his throat.  He maintained strong and clear vocal quality between bites.   PHONATION:  Patient able to imitate words/phrases, maintaining loudness for all words with 95% accuracy. In conversation, he was able to maintain loudness and express himself using voice and gestures.       Assessment / Recommendations / Plan   Plan  Continue with current plan of care      Progression Toward Goals   Progression toward goals  Progressing toward goals       SLP Education - 10/17/17 0847    Education Details  Therapist reminded him that speaking louder help people understand him  [facility] MAIN [facility] SERVICES [address] [place], [place], [address] Phone: [phone]   Fax:  [phone]   Name: Ronald [name] MRN: [id number] Date of Birth: [birth year]  [facility] MAIN [facility] SERVICES [address] [place], [place], [address] Phone: [phone]   Fax:  [phone]   Name: Ronald [name] MRN: [id number] Date of Birth: [birth year]

## 2017-10-18 ENCOUNTER — Ambulatory Visit: Payer: Medicare Other | Admitting: Occupational Therapy

## 2017-10-18 ENCOUNTER — Encounter: Payer: Self-pay | Admitting: Speech Pathology

## 2017-10-18 ENCOUNTER — Ambulatory Visit: Payer: Medicare Other | Admitting: Speech Pathology

## 2017-10-18 ENCOUNTER — Ambulatory Visit: Payer: Medicare Other | Admitting: Physical Therapy

## 2017-10-18 ENCOUNTER — Encounter: Payer: Self-pay | Admitting: Occupational Therapy

## 2017-10-18 DIAGNOSIS — R4702 Dysphasia: Secondary | ICD-10-CM | POA: Diagnosis not present

## 2017-10-18 DIAGNOSIS — M6281 Muscle weakness (generalized): Secondary | ICD-10-CM | POA: Diagnosis not present

## 2017-10-18 DIAGNOSIS — R1312 Dysphagia, oropharyngeal phase: Secondary | ICD-10-CM

## 2017-10-18 DIAGNOSIS — R278 Other lack of coordination: Secondary | ICD-10-CM

## 2017-10-18 DIAGNOSIS — R471 Dysarthria and anarthria: Secondary | ICD-10-CM | POA: Diagnosis not present

## 2017-10-18 DIAGNOSIS — R2681 Unsteadiness on feet: Secondary | ICD-10-CM | POA: Diagnosis not present

## 2017-10-18 NOTE — Therapy (Addendum)
[place] [facility] MAIN [facility] SERVICES [address] [place], [place], [address] Phone: [phone]   Fax:  [phone]  Occupational Therapy Treatment  Patient Details  Name: Ronald [name] MRN: [id number] Date of Birth: November 25, 1940 Referring Provider: Dr. Wynn Banker   Encounter Date: 10/18/2017  OT End of Session - 10/18/17 1520    Visit Number  10    Number of Visits  24    Date for OT Re-Evaluation  11/28/17    Authorization Type  Visit 10 of 10 for progress report period starting 09/05/2017    OT Start Time  1300    OT Stop Time  1345    OT Time Calculation (min)  45 min    Activity Tolerance  Patient tolerated treatment well    Behavior During Therapy  [facility] for tasks assessed/performed       Past Medical History:  Diagnosis Date  . COPD (chronic obstructive pulmonary disease) (HCC)   . Emphysema lung (HCC)   . Hx of long term use of blood thinners   . Hypertension   . Stroke [facility]) 2013  . Thyroid disease    hypothyroidism    Past Surgical History:  Procedure Laterality Date  . IR ANGIO EXTRACRAN SEL COM CAROTID INNOMINATE UNI R MOD SED  06/19/2017  . IR ANGIO VERTEBRAL SEL SUBCLAVIAN INNOMINATE UNI R MOD SED  06/19/2017  . IR GASTROSTOMY TUBE MOD SED  07/04/2017  . RADIOLOGY WITH ANESTHESIA N/A 06/19/2017   Procedure: RADIOLOGY WITH ANESTHESIA;  Surgeon: Julieanne Cotton, MD;  [facility] OR;  Service: Radiology;  Laterality: N/A;    There were no vitals filed for this visit.  Subjective Assessment - 10/18/17 1317    Subjective   Pt. answers questions using nodding gesture.    Patient is accompained by:  Family member    Pertinent History  Pt. is a 77 y.o. male who was admitted to [facility] with a Right MCA Infarct with Lft Hemiparesis, and severe oropharyngeal dysphagia, and severe dysarthria.    Patient Stated Goals  Patient would like to be as independent as possible.     Currently in Pain?  Yes    Pain Score  8  Using the  Wong-Baker Facial Grimace Scale.      OT TREATMENT    Neuro muscular re-education:  Pt. worked on grasping 1" resistive cubes alternating thumb opposition to the tip of the 2nd digit while the board is placed at a vertical angle. Pt. worked on pressing the cubes back into place while alternating isolated 2nd through 5th digit extension. Pt. required extensive visual, verbal, and tactile cues for movement patterns. Pt. Worked on grasping 1" flat washers using a 2pt. Pinch, and reaching up to place them on vertical dowels.   Therapeutic Exercise:  Pt. Worked on the [employer] for 8 min. With constant monitoring of the BUEs. Pt. worked on changing, and alternating forward reverse position every 2 min. Rest breaks were required. Pt. performed gross gripping with grip strengthener. Pt. worked on sustaining grip while grasping pegs and reaching at various heights. The gripper was placed in the 3rd resistive slot with the white resistive spring.    OT Long Term Goals - 10/18/17      OT LONG TERM GOAL #1   Title  Pt. will demonstrate independence with visual compensatory strategies during ADL, and IADL tasks.    Baseline  10/18/2017: Visual impairment present. Vision to be further assessed to determine how it may  be affecting ADL/IADL functioning.    Time  12    Period  Weeks    Status  On-going    Target Date  11/28/17      OT LONG TERM GOAL #2   Title  Pt. will increase Left grip strength will increase by 10# to improve ADL/IADL functioning.    Baseline  10/18/2017: Limited LUE grip strength    Time  12    Period  Weeks    Status  On-going    Target Date  11/28/17      OT LONG TERM GOAL #3   Title  Pt. will improve FMC skills by 5 sec. of speed to be able to manipulate ADL/IADL objects.    Baseline  10/18/2017: Limited left hand coordination skills.    Time  12    Period  Weeks    Status  On-going    Target Date  11/28/17      OT LONG TERM GOAL #4   Title  Pt. will accurately identify  10/10 home safety hazards for ADLs, and IADLs    Baseline  10/18/2017: Limited.    Time  12    Period  Weeks    Status  On-going    Target Date  11/28/17                         OT Education - 10/18/17 1321    Education provided  Yes    Education Details  fine motor coordination skills    Person(s) Educated  Patient    Methods  Explanation;Demonstration    Comprehension  Verbalized understanding;Returned demonstration;Verbal cues required          OT Long Term Goals - 09/05/17 1834      OT LONG TERM GOAL #1   Title  Pt. will demonstrate independence with visual compensatory strategies during ADL, and IADL tasks.    Baseline  Eval: Visual impairment present. Vision to be further assessed to determine how it may be affecting ADL/IADL functioning.    Time  12    Period  Weeks    Status  New    Target Date  11/28/17      OT LONG TERM GOAL #2   Title  Pt. will increase Left grip strength will increase by 10# to improve ADL/IADL functioning.    Baseline  Eval: Limited LUE grip strength    Time  12    Period  Weeks    Status  New    Target Date  11/28/17      OT LONG TERM GOAL #3   Title  Pt. will improve FMC skills by 5 sec. of speed to be able to manipulate ADL/IADL objects.    Baseline  Eval: Limited left hand coordination skills.    Time  12    Period  Weeks    Status  New    Target Date  11/28/17      OT LONG TERM GOAL #4   Title  Pt. will accurately identify 10/10 home safety hazards for ADLs, and IADLs    Baseline  EVal: Limited.    Time  12    Period  Weeks    Status  New    Target Date  11/28/17            Plan - 10/18/17 1520    Clinical Impression Statement  Pt.'s daughter brought pt. to therapy today. Pt. required extensive cues for  motor planning movements. Pt. required visual demonstration.  Pt. continues to present with limited UE strength, and coordination skills, and continues to work on improving UE functioning during ADLs,  and IADLs.    Occupational Profile and client history currently impacting functional performance  Pt. resides with his daughter.    Occupational performance deficits (Please refer to evaluation for details):  ADL's;IADL's    Rehab Potential  Poor    Current Impairments/barriers affecting progress:  Positive Indicators: age, family support, Negative Indicators: multiple comorbidities    OT Frequency  2x / week    OT Duration  12 weeks    OT Treatment/Interventions  Self-care/ADL training;Therapeutic exercise;Therapeutic activities;Neuromuscular education;DME and/or AE instruction;Patient/family education;Energy conservation    Clinical Decision Making  Several treatment options, min-mod task modification necessary    Consulted and Agree with Plan of Care  Patient       Patient will benefit from skilled therapeutic intervention in order to improve the following deficits and impairments:  Impaired UE functional use, Decreased activity tolerance, Decreased endurance, Decreased coordination, Impaired vision/preception, Decreased cognition, Decreased strength  Visit Diagnosis: Muscle weakness (generalized)  Other lack of coordination    Problem List Patient Active Problem List   Diagnosis Date Noted  . PEG (percutaneous endoscopic gastrostomy) status (HCC)   . Benign essential HTN   . Hypertensive crisis   . Dysarthria, post-stroke   . Acute pain of right shoulder   . 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 Dysphagia, oropharyngeal 06/23/2017  . Respiratory failure (HCC)   . Palliative care encounter   . Aspiration pneumonia due to gastric secretions (HCC)   . Atrial fibrillation (HCC)   . 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 [place], [place], [address] Phone: [phone]   Fax:  [phone]  Name: Ronald [name] MRN: [id number] Date of  Birth: 1940-10-13

## 2017-10-18 NOTE — Therapy (Signed)
SLP Education - 10/18/17 1536    Education Details  Therapist reminded him that speaking louder help people understand him better.      Person(s) Educated  Patient    Methods  Explanation    Comprehension  Verbalized understanding         SLP Long Term Goals - 10/17/17 0849      SLP LONG TERM GOAL #1   Title  Pt will improve speech intelligibility for words and phrases by controlling rate of speech, over-articulation, and increased loudness to achieve 80% intelligibility with min. SLP cues.    Time  8    Period  Weeks    Status  Partially Met    Target Date  12/16/17      SLP LONG TERM GOAL #2   Title  Patient will execute Swallowing and Voice Building HEP  5-7 days per week, with assistance from family as needed.    Time  8    Period  Weeks    Status  On-going    Target Date  12/16/17      SLP LONG TERM GOAL #3   Title  Patient will tolerate least restrictive oral diet pending MBS, to be scheduled.    Time  8    Period  Weeks    Status  Achieved      SLP LONG TERM GOAL #4   Title  Patient will consume 4 oz thin liquid with no more than 1 overt sign/symptom of aspiration.     Status  New    Target Date  12/16/17       Plan - 10/18/17 1536    Clinical Impression Statement  Pt. is engaged and making progress. He is improving his swallowing and clearance and continues to speak loudly during phonation activities. He took about 15 minutes to finish 4 oz of thin liquid with some conversation in between. Patient and daughter report pt is doing well with oral intake (per record returned to SLP). His intelligibility improves when he is louder and his articulation was noticeably clearer this session.     Speech Therapy Frequency  2x / week    Duration  Other (comment) 8 weeks    Potential to Achieve Goals  Good    Potential Considerations  Ability to learn/carryover information;Pain level;Family/community support;Co-morbidities;Previous level of function;Cooperation/participation level;Severity of impairments;Medical prognosis    Consulted and Agree with Plan of Care  Patient       Patient will benefit from skilled therapeutic intervention in order to improve the following deficits and impairments:   Dysarthria and anarthria  Dysphagia, oropharyngeal  Oropharyngeal dysphagia    Problem List Patient Active Problem List   Diagnosis Date Noted  . PEG (percutaneous endoscopic gastrostomy) status ([place])   . Benign essential HTN   . Hypertensive crisis   . Dysarthria, post-stroke   . Acute pain of right shoulder   . Parietal lobe infarction ([place]) 06/23/2017  . Dysphagia, oropharyngeal 06/23/2017  . Respiratory failure ([place])   .  Palliative care encounter   . Aspiration pneumonia due to gastric secretions ([place])   . Atrial fibrillation ([place])   . Stroke (cerebrum) ([place]) 06/19/2017    Ronald [name] 10/18/2017, 3:38 PM  [place] MAIN [facility] SERVICES [address] [place], [place], [address] Phone: [phone]   Fax:  [phone]   Name: Ronald [name] MRN: [id number] Date of Birth: 1940/07/22  [place] MAIN [facility] SERVICES [address] [place], [place], [address] Phone: [phone]   Fax:  [phone]  Speech Language Pathology Treatment  Patient Details  Name: Ronald [name] MRN: [id number] Date of Birth: [birth year] Referring Provider: Charlett Blake   Encounter Date: 10/18/2017  End of Session - 10/18/17 1536    Visit Number  17    Number of Visits  33    Date for SLP Re-Evaluation  12/16/17    SLP Start Time  1400    SLP Stop Time   1450    SLP Time Calculation (min)  50 min    Activity Tolerance  Patient tolerated treatment well       Past Medical History:  Diagnosis Date  . COPD (chronic obstructive pulmonary disease) ([place])   . Emphysema lung ([place])   . Hx of long term use of blood thinners   . Hypertension   . Stroke [facility]) 2013  . Thyroid disease    hypothyroidism    Past Surgical History:  Procedure Laterality Date  . IR ANGIO EXTRACRAN SEL COM CAROTID INNOMINATE UNI R MOD SED  06/19/2017  . IR ANGIO VERTEBRAL SEL SUBCLAVIAN INNOMINATE UNI R MOD SED  06/19/2017  . IR GASTROSTOMY TUBE MOD SED  07/04/2017  . RADIOLOGY WITH ANESTHESIA N/A 06/19/2017   Procedure: RADIOLOGY WITH ANESTHESIA;  Surgeon: Luanne Bras, MD;  Location: [place];  Service: Radiology;  Laterality: N/A;    There were no vitals filed for this visit.  Subjective Assessment - 10/18/17 1536    Subjective  Engaged and talkative. He was able to explain himself in conversation and his intelligibility improved when he spoke in a louder voice.     Patient is accompained by:  Family member    Currently in Pain?  Yes    Pain Score  0-No pain            ADULT SLP TREATMENT - 10/18/17 0001      General Information   Behavior/Cognition  Alert;Cooperative;Pleasant mood;Distractible    HPI  77 year old man, with history of prior stroke with speech and language deficits, with right hemisphere CVA 06/19/2017 with profound dysarthria and severe  oropharyngeal dysphagia.  The patient received inpatient rehab services 06/23/2017 - 07/04/2017 and home health services on discharge.         Treatment Provided   Treatment provided  Dysphagia;Cognitive-Linquistic      Pain Assessment   Pain Assessment  No/denies pain      Cognitive-Linquistic Treatment   Treatment focused on  Dysarthria;Apraxia;Voice;Other (comment);Patient/family/caregiver education Dysphagia    Skilled Treatment   SWALLOWING:   Patient returned with completed log.   Today, the patient took 4 ounces of thin liquid, water, using a spoon full. He had 3 coughing episodes in the first 5 swallows but did not cough after this and exhibited clear voice until the last spoonful where he coughed once. He maintained strong and clear vocal quality between bites.   PHONATION:  Patient able to imitate words/phrases, maintaining loudness for all words with 95% accuracy. In conversation, he was able to maintain loudness and express himself using voice and gestures. His intelligibility is improving and he more accuracy articulates syllables within words.       Assessment / Recommendations / Plan   Plan  Continue with current plan of care      Progression Toward Goals   Progression toward goals  Progressing toward goals

## 2017-10-23 ENCOUNTER — Ambulatory Visit: Payer: Medicare Other | Admitting: Speech Pathology

## 2017-10-23 ENCOUNTER — Encounter: Payer: Self-pay | Admitting: Occupational Therapy

## 2017-10-23 ENCOUNTER — Ambulatory Visit: Payer: Medicare Other | Admitting: Physical Therapy

## 2017-10-23 ENCOUNTER — Ambulatory Visit: Payer: Medicare Other | Admitting: Occupational Therapy

## 2017-10-23 DIAGNOSIS — M6281 Muscle weakness (generalized): Secondary | ICD-10-CM | POA: Diagnosis not present

## 2017-10-23 DIAGNOSIS — R278 Other lack of coordination: Secondary | ICD-10-CM

## 2017-10-23 DIAGNOSIS — R4702 Dysphasia: Secondary | ICD-10-CM | POA: Diagnosis not present

## 2017-10-23 DIAGNOSIS — R2681 Unsteadiness on feet: Secondary | ICD-10-CM | POA: Diagnosis not present

## 2017-10-23 DIAGNOSIS — R471 Dysarthria and anarthria: Secondary | ICD-10-CM | POA: Diagnosis not present

## 2017-10-23 DIAGNOSIS — R1312 Dysphagia, oropharyngeal phase: Secondary | ICD-10-CM

## 2017-10-23 NOTE — Therapy (Signed)
[place] [facility] MAIN [facility] SERVICES [address] [place], [place], [address] Phone: [phone]   Fax:  [phone]  Occupational Therapy Treatment  Patient Details  Name: Ronald [name] MRN: [id number] Date of Birth: 04-Jul-1940 Referring Provider: Dr. Wynn Banker   Encounter Date: 10/23/2017  OT End of Session - 10/23/17 1318    Visit Number  11    Number of Visits  24    Date for OT Re-Evaluation  11/28/17    Authorization Type  Visit 1 of 10 for progress report period starting 10/23/2017    OT Start Time  1300    OT Stop Time  1345    OT Time Calculation (min)  45 min    Activity Tolerance  Patient tolerated treatment well    Behavior During Therapy  [facility] for tasks assessed/performed       Past Medical History:  Diagnosis Date  . COPD (chronic obstructive pulmonary disease) (HCC)   . Emphysema lung (HCC)   . Hx of long term use of blood thinners   . Hypertension   . Stroke [facility]) 2013  . Thyroid disease    hypothyroidism    Past Surgical History:  Procedure Laterality Date  . IR ANGIO EXTRACRAN SEL COM CAROTID INNOMINATE UNI R MOD SED  06/19/2017  . IR ANGIO VERTEBRAL SEL SUBCLAVIAN INNOMINATE UNI R MOD SED  06/19/2017  . IR GASTROSTOMY TUBE MOD SED  07/04/2017  . RADIOLOGY WITH ANESTHESIA N/A 06/19/2017   Procedure: RADIOLOGY WITH ANESTHESIA;  Surgeon: Julieanne Cotton, MD;  [facility] OR;  Service: Radiology;  Laterality: N/A;    There were no vitals filed for this visit.  Subjective Assessment - 10/23/17 1316    Subjective   Pt. conitnues to use gestures in reponse to questions.    Patient is accompained by:  Family member    Pertinent History  Pt. is a 77 y.o. male who was admitted to [facility] with a Right MCA Infarct with Lft Hemiparesis, and severe oropharyngeal dysphagia, and severe dysarthria.    Patient Stated Goals  Patient would like to be as independent as possible.     Currently in Pain?  No/denies      OT TREATMENT     Neuro muscular re-education:  Pt. orked on [facility] skills manipulating nuts, and bolts on a bolt board. Pt. worked on screwing, and unscrewing nuts, and bolts of varying sizes, and challenging progressively smaller items. Pt. Worked on UE motor control, and coordination skills while handling, and using tools. Pt. worked on Public librarian, Patent attorney, and screw drivers to remove screws, and bolts of varying sizes. Pt. Worked on motor control with tool use.                           OT Education - 10/23/17 1318    Education provided  Yes    Education Details  fine motor coordination skills    Person(s) Educated  Patient    Methods  Explanation;Demonstration    Comprehension  Verbalized understanding;Returned demonstration;Verbal cues required          OT Long Term Goals - 09/05/17 1834      OT LONG TERM GOAL #1   Title  Pt. will demonstrate independence with visual compensatory strategies during ADL, and IADL tasks.    Baseline  Eval: Visual impairment present. Vision to be further assessed to determine how it may be affecting ADL/IADL functioning.  Time  12    Period  Weeks    Status  New    Target Date  11/28/17      OT LONG TERM GOAL #2   Title  Pt. will increase Left grip strength will increase by 10# to improve ADL/IADL functioning.    Baseline  Eval: Limited LUE grip strength    Time  12    Period  Weeks    Status  New    Target Date  11/28/17      OT LONG TERM GOAL #3   Title  Pt. will improve FMC skills by 5 sec. of speed to be able to manipulate ADL/IADL objects.    Baseline  Eval: Limited left hand coordination skills.    Time  12    Period  Weeks    Status  New    Target Date  11/28/17      OT LONG TERM GOAL #4   Title  Pt. will accurately identify 10/10 home safety hazards for ADLs, and IADLs    Baseline  EVal: Limited.    Time  12    Period  Weeks    Status  New    Target Date  11/28/17            Plan -  10/23/17 1319    Clinical Impression Statement  Pt. continues to work on improving UE strength, and fine motor coordination skills. Pt. continues to require max cues for movement patterns, and motor planning skills. Pt. requires increased cues to engage his left hand into the task.     Occupational Profile and client history currently impacting functional performance  Pt. resides with his daughter.    Occupational performance deficits (Please refer to evaluation for details):  ADL's;IADL's    Rehab Potential  Poor    Current Impairments/barriers affecting progress:  Positive Indicators: age, family support, Negative Indicators: multiple comorbidities    OT Frequency  2x / week    OT Duration  12 weeks    OT Treatment/Interventions  Self-care/ADL training;Therapeutic exercise;Therapeutic activities;Neuromuscular education;DME and/or AE instruction;Patient/family education;Energy conservation    Clinical Decision Making  Several treatment options, min-mod task modification necessary    Consulted and Agree with Plan of Care  Patient       Patient will benefit from skilled therapeutic intervention in order to improve the following deficits and impairments:  Impaired UE functional use, Decreased activity tolerance, Decreased endurance, Decreased coordination, Impaired vision/preception, Decreased cognition, Decreased strength  Visit Diagnosis: Muscle weakness (generalized)  Other lack of coordination    Problem List Patient Active Problem List   Diagnosis Date Noted  . PEG (percutaneous endoscopic gastrostomy) status (HCC)   . Benign essential HTN   . Hypertensive crisis   . Dysarthria, post-stroke   . Acute pain of right shoulder   . 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 Dysphagia, oropharyngeal 06/23/2017  . Respiratory failure (HCC)   . Palliative care encounter   . Aspiration pneumonia due to gastric secretions (HCC)   . Atrial fibrillation (HCC)   . Stroke (cerebrum) (HCC)  06/19/2017    Olegario Messier, MS, OTR/L 10/23/2017, 1:34 PM  [place] [facility] MAIN [facility] SERVICES [address] [place], [place], [address] Phone: [phone]   Fax:  [phone]  Name: Ronald [name] MRN: [id number] Date of Birth: 1940/11/22

## 2017-10-24 ENCOUNTER — Encounter: Payer: Self-pay | Admitting: Speech Pathology

## 2017-10-24 NOTE — Therapy (Signed)
[place] [facility] MAIN [facility] SERVICES [address] [place], [place], [address] Phone: [phone]   Fax:  [phone]  Speech Language Pathology Treatment  Patient Details  Name: Ronald [name] MRN: [id number] Date of Birth: 28-Aug-1940 Referring Provider: [name]   Encounter Date: 10/23/2017  End of Session - 10/24/17 1225    Visit Number  18    Number of Visits  33    Date for SLP Re-Evaluation  12/16/17    SLP Start Time  1400    SLP Stop Time   1450    SLP Time Calculation (min)  50 min    Activity Tolerance  Patient tolerated treatment well       Past Medical History:  Diagnosis Date  . COPD (chronic obstructive pulmonary disease) (HCC)   . Emphysema lung (HCC)   . Hx of long term use of blood thinners   . Hypertension   . Stroke [facility] 2013  . Thyroid disease    hypothyroidism    Past Surgical History:  Procedure Laterality Date  . IR ANGIO EXTRACRAN SEL COM CAROTID INNOMINATE UNI R MOD SED  06/19/2017  . IR ANGIO VERTEBRAL SEL SUBCLAVIAN INNOMINATE UNI R MOD SED  06/19/2017  . IR GASTROSTOMY TUBE MOD SED  07/04/2017  . RADIOLOGY WITH ANESTHESIA N/A 06/19/2017   Procedure: RADIOLOGY WITH ANESTHESIA;  Surgeon: Julieanne Cotton, MD;  Location: [facility] OR;  Service: Radiology;  Laterality: N/A;    There were no vitals filed for this visit.  Subjective Assessment - 10/24/17 1224    Subjective  Engaged and talkative. He was able to explain himself in conversation and his intelligibility improved when he spoke in a louder voice.     Currently in Pain?  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  The patient  received inpatient rehab services 06/23/2017 - 07/04/2017 and home health services on discharge.         Treatment Provided   Treatment provided  Dysphagia;Cognitive-Linquistic      Pain Assessment   Pain Assessment  No/denies pain      Cognitive-Linquistic Treatment   Treatment focused on  Dysarthria;Apraxia;Voice;Other (comment);Patient/family/caregiver education Dysphagia    Skilled Treatment  SWALLOWING:   Patient returned with completed log.   Today, the patient took 4 ounces of thin liquid, water, using a spoon full for 2oz and 10 mL at a time for 2 oz. He coughed in 60% of the trails using the spoon, when sipping 10 mL at a time, he coughed in 3/5 trials but was able to clear the liquid and maintain clear voice quality. PHONATION:  Patient able to imitate words/phrases, maintaining loudness for all words with 95% accuracy. In conversation, he was able to maintain loudness and express himself using voice and gestures. His intelligibility is improving.      Assessment / Recommendations / Plan   Plan  Continue with current plan of care      Progression Toward Goals   Progression toward goals  Progressing toward goals       SLP Education - 10/24/17 1225    Education Details  Therapist reminded him that speaking louder help people understand him better    Person(s) Educated  Patient    Methods  Explanation    Comprehension  Verbalized understanding         SLP Long Term Goals - 10/17/17 0849      SLP LONG TERM GOAL #1   Title  Pt will improve speech intelligibility for words and phrases by controlling rate of speech, over-articulation, and increased loudness to achieve 80% intelligibility with min. SLP cues.    Time  8    Period  Weeks    Status  Partially Met    Target Date  12/16/17      SLP LONG TERM GOAL #2   Title  Patient will execute Swallowing and Voice Building HEP 5-7 days per week, with assistance from family as needed.    Time  8    Period  Weeks    Status   On-going    Target Date  12/16/17      SLP LONG TERM GOAL #3   Title  Patient will tolerate least restrictive oral diet pending MBS, to be scheduled.    Time  8    Period  Weeks    Status  Achieved      SLP LONG TERM GOAL #4   Title  Patient will consume 4 oz thin liquid with no more than 1 overt sign/symptom of aspiration.     Status  New    Target Date  12/16/17       Plan - 10/24/17 1225    Clinical Impression Statement  Pt. is engaged and making progress. He is improving his swallowing and clearance and continues to speak loudly during phonation activities. He took about 15 minutes to finish 4 oz of thin liquid with some conversation in between. Patient and daughter report pt is doing well with oral intake (per record returned to SLP). His intelligibility improves when he is louder and his motor speech is improving.     Speech Therapy Frequency  2x / week    Duration  Other (comment) 8 wks    Treatment/Interventions  Pharyngeal strengthening exercises;Diet toleration management by SLP;Oral motor exercises;Compensatory strategies;SLP instruction and feedback;Patient/family education    Potential to Achieve Goals  Good    Potential Considerations  Ability to learn/carryover information;Pain level;Family/community support;Co-morbidities;Previous level of function;Cooperation/participation level;Severity of impairments;Medical prognosis    Consulted and Agree with Plan of Care  Patient       Patient will benefit from skilled therapeutic intervention in order to improve the following deficits and impairments:   Dysphagia, oropharyngeal  Dysarthria and anarthria    Problem List Patient Active Problem List   Diagnosis Date Noted  . PEG (percutaneous endoscopic gastrostomy) status (HCC)   . Benign essential HTN   . Hypertensive crisis   . Dysarthria, post-stroke   . Acute pain of right shoulder   . 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 Dysphagia, oropharyngeal 06/23/2017   . Respiratory failure (HCC)   . Palliative care encounter   . Aspiration pneumonia due to gastric secretions (HCC)   . Atrial fibrillation (HCC)   . 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 MAIN [facility] SERVICES [address] [place], [place], [address] Phone: [phone]   Fax:  [phone]   Name: Ronald [name] MRN: [id number] Date of Birth: 04/29/1941

## 2017-10-25 ENCOUNTER — Ambulatory Visit: Payer: Medicare Other | Admitting: Speech Pathology

## 2017-10-25 ENCOUNTER — Encounter: Payer: Self-pay | Admitting: Speech Pathology

## 2017-10-25 ENCOUNTER — Ambulatory Visit: Payer: Medicare Other | Admitting: Occupational Therapy

## 2017-10-25 ENCOUNTER — Encounter: Payer: Self-pay | Admitting: Occupational Therapy

## 2017-10-25 ENCOUNTER — Ambulatory Visit: Payer: Medicare Other | Admitting: Physical Therapy

## 2017-10-25 DIAGNOSIS — M6281 Muscle weakness (generalized): Secondary | ICD-10-CM | POA: Diagnosis not present

## 2017-10-25 DIAGNOSIS — R278 Other lack of coordination: Secondary | ICD-10-CM

## 2017-10-25 DIAGNOSIS — R471 Dysarthria and anarthria: Secondary | ICD-10-CM

## 2017-10-25 DIAGNOSIS — R1312 Dysphagia, oropharyngeal phase: Secondary | ICD-10-CM

## 2017-10-25 DIAGNOSIS — R2681 Unsteadiness on feet: Secondary | ICD-10-CM | POA: Diagnosis not present

## 2017-10-25 DIAGNOSIS — R4702 Dysphasia: Secondary | ICD-10-CM | POA: Diagnosis not present

## 2017-10-25 NOTE — Therapy (Signed)
[place] [facility] MAIN [facility] SERVICES [address] [place], [place], [address] Phone: [phone]   Fax:  [phone]  Speech Language Pathology Treatment  Patient Details  Name: Ronald [name] MRN: [id number] Date of Birth: 02-05-1941 Referring Provider: [name]   Encounter Date: 10/25/2017  End of Session - 10/25/17 1505    Visit Number  19    Number of Visits  33    Date for SLP Re-Evaluation  12/16/17    SLP Start Time  1400    SLP Stop Time   1445    SLP Time Calculation (min)  45 min    Activity Tolerance  Patient tolerated treatment well       Past Medical History:  Diagnosis Date  . COPD (chronic obstructive pulmonary disease) (HCC)   . Emphysema lung (HCC)   . Hx of long term use of blood thinners   . Hypertension   . Stroke [facility]) 2013  . Thyroid disease    hypothyroidism    Past Surgical History:  Procedure Laterality Date  . IR ANGIO EXTRACRAN SEL COM CAROTID INNOMINATE UNI R MOD SED  06/19/2017  . IR ANGIO VERTEBRAL SEL SUBCLAVIAN INNOMINATE UNI R MOD SED  06/19/2017  . IR GASTROSTOMY TUBE MOD SED  07/04/2017  . RADIOLOGY WITH ANESTHESIA N/A 06/19/2017   Procedure: RADIOLOGY WITH ANESTHESIA;  Surgeon: Julieanne Cotton, MD;  Location: [facility] OR;  Service: Radiology;  Laterality: N/A;    There were no vitals filed for this visit.  Subjective Assessment - 10/25/17 1505    Subjective  Engaged and talkative. He was able to explain himself in conversation and his intelligibility improved when he spoke in a louder voice.     Currently in Pain?  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  The patient  received inpatient rehab services 06/23/2017 - 07/04/2017 and home health services on discharge.         Treatment Provided   Treatment provided  Dysphagia;Cognitive-Linquistic      Pain Assessment   Pain Assessment  No/denies pain      Cognitive-Linquistic Treatment   Treatment focused on  Dysarthria;Apraxia;Voice;Other (comment);Patient/family/caregiver education Dysphagia    Skilled Treatment  SWALLOWING:   Patient returned with completed log.   Today, the patient took 4 ounces of thin liquid, water, using a spoon full for 2oz and 15 mL at a time for 2 oz. He coughed in 1/7 trials using the spoon, when sipping 15 mL at a time, he coughed in 1/5 trials but was able to clear the liquid and maintain clear voice quality. PHONATION:  Patient able to imitate words/phrases, maintaining loudness for all words with 95% accuracy. In conversation, he was able to maintain loudness and express himself using voice and gestures. His intelligibility is improving.      Assessment / Recommendations / Plan   Plan  Continue with current plan of care      Progression Toward Goals   Progression toward goals  Progressing toward goals       SLP Education - 10/25/17 1505    Education Details  Therapist reminded  him that speaking louder help people understand him better.      Person(s) Educated  Patient    Methods  Explanation    Comprehension  Verbalized understanding         SLP Long Term Goals - 10/17/17 0849      SLP LONG TERM GOAL #1   Title  Pt will improve speech intelligibility for words and phrases by controlling rate of speech, over-articulation, and increased loudness to achieve 80% intelligibility with min. SLP cues.    Time  8    Period  Weeks    Status  Partially Met    Target Date  12/16/17      SLP LONG TERM GOAL #2   Title  Patient will execute Swallowing and Voice Building HEP 5-7 days per week, with assistance from family as needed.    Time  8    Period  Weeks    Status  On-going     Target Date  12/16/17      SLP LONG TERM GOAL #3   Title  Patient will tolerate least restrictive oral diet pending MBS, to be scheduled.    Time  8    Period  Weeks    Status  Achieved      SLP LONG TERM GOAL #4   Title  Patient will consume 4 oz thin liquid with no more than 1 overt sign/symptom of aspiration.     Status  New    Target Date  12/16/17       Plan - 10/25/17 1505    Clinical Impression Statement  Pt. is engaged and making progress. He is improving his swallowing and clearance and continues to speak loudly during phonation activities. He took about 15 minutes to finish 4 oz of thin liquid with some conversation in between. Patient and daughter report pt is doing well with oral intake (per record returned to SLP). His intelligibility improves when he is louder and his motor speech is improving.     Speech Therapy Frequency  2x / week    Duration  Other (comment) 8 wks    Treatment/Interventions  Pharyngeal strengthening exercises;Diet toleration management by SLP;Oral motor exercises;Compensatory strategies;SLP instruction and feedback;Patient/family education    Potential to Achieve Goals  Good    Potential Considerations  Ability to learn/carryover information;Pain level;Family/community support;Co-morbidities;Previous level of function;Cooperation/participation level;Severity of impairments;Medical prognosis    SLP Home Exercise Plan  Respiratory support activities, voice activities, oral intake log, swallowing recommendations, and things to observe for.      Consulted and Agree with Plan of Care  Patient       Patient will benefit from skilled therapeutic intervention in order to improve the following deficits and impairments:   Oropharyngeal dysphagia  Dysarthria and anarthria    Problem List Patient Active Problem List   Diagnosis Date Noted  . PEG (percutaneous endoscopic gastrostomy) status (HCC)   . Benign essential HTN   . Hypertensive crisis   .  Dysarthria, post-stroke   . Acute pain of right shoulder   . 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 Dysphagia, oropharyngeal 06/23/2017  . Respiratory failure (HCC)   . Palliative care encounter   . Aspiration pneumonia due to gastric secretions (HCC)   . Atrial fibrillation (HCC)   . 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

## 2017-10-25 NOTE — Therapy (Signed)
[facility] [facility] MAIN [facility] SERVICES [address] [place], [place], [address] Phone: [phone]   Fax:  [phone]  Occupational Therapy Treatment  Patient Details  Name: Ronald [name] MRN: [id number] Date of Birth: 09-Jun-1940 Referring Provider: Dr. Wynn Banker   Encounter Date: 10/25/2017  OT End of Session - 10/25/17 1318    Visit Number  12    Number of Visits  24    Date for OT Re-Evaluation  11/28/17    Authorization Type  Visit 2 of 10 for progress report period starting 10/23/2017    OT Start Time  1300    OT Stop Time  1345    OT Time Calculation (min)  45 min    Activity Tolerance  Patient tolerated treatment well    Behavior During Therapy  [facility] for tasks assessed/performed       Past Medical History:  Diagnosis Date  . COPD (chronic obstructive pulmonary disease) (HCC)   . Emphysema lung (HCC)   . Hx of long term use of blood thinners   . Hypertension   . Stroke [facility]) 2013  . Thyroid disease    hypothyroidism    Past Surgical History:  Procedure Laterality Date  . IR ANGIO EXTRACRAN SEL COM CAROTID INNOMINATE UNI R MOD SED  06/19/2017  . IR ANGIO VERTEBRAL SEL SUBCLAVIAN INNOMINATE UNI R MOD SED  06/19/2017  . IR GASTROSTOMY TUBE MOD SED  07/04/2017  . RADIOLOGY WITH ANESTHESIA N/A 06/19/2017   Procedure: RADIOLOGY WITH ANESTHESIA;  Surgeon: Julieanne Cotton, MD;  Location: [facility] OR;  Service: Radiology;  Laterality: N/A;    There were no vitals filed for this visit.  Subjective Assessment - 10/25/17 1316    Subjective   Pt. continues to present with severe communication deficits. Pt. continues to use gestures in reponse to questions.    Patient is accompained by:  Family member    Pertinent History  Pt. is a 77 y.o. male who was admitted to [facility] with a Right MCA Infarct with Lft Hemiparesis, and severe oropharyngeal dysphagia, and severe dysarthria.    Patient Stated Goals  Patient would like to be as independent as  possible.     Currently in Pain?  No/denies      OT TREATMENT    Neuro muscular re-education:  Pt. worked on bilateral hand coordination skills grasping small 1/2" flat washers, and placing them over various vertical dowels. Pt. required extensive cues for movement patterns, and motor planning. Pt. worked on grasping small 1/2" beads. Pt. worked on connecting, and fastening the beads using a 3pt. pinch. Pt. worked on disconnecting the beads against resistance using bilateral 3pt. Pinch.Pt. Required consistent verbal cues, and cues for visual demonstration.  Therapeutic Exercise:  Pt. worked on the [employer] for 8 min. With constant monitoring of the BUEs. Pt. worked on changing, and alternating forward reverse position every 2 min. rest breaks were required.                         OT Education - 10/25/17 1317    Education provided  Yes    Education Details  fine motor coordination skills    Person(s) Educated  Patient    Methods  Explanation;Demonstration    Comprehension  Verbalized understanding;Returned demonstration;Verbal cues required          OT Long Term Goals - 10/23/17 1342      OT LONG TERM GOAL #1   Title  Ronald [name] MRN: [id number] Date of Birth: 1940/08/19  [facility] MAIN [facility] SERVICES [address] [place], [place], [address] Phone: [phone]   Fax:  [phone]  Occupational Therapy Treatment  Patient Details  Name: Ronald [name] MRN: [id number] Date of Birth: 09-Jun-1940 Referring Provider: Dr. Wynn Banker   Encounter Date: 10/25/2017  OT End of Session - 10/25/17 1318    Visit Number  12    Number of Visits  24    Date for OT Re-Evaluation  11/28/17    Authorization Type  Visit 2 of 10 for progress report period starting 10/23/2017    OT Start Time  1300    OT Stop Time  1345    OT Time Calculation (min)  45 min    Activity Tolerance  Patient tolerated treatment well    Behavior During Therapy  [facility] for tasks assessed/performed       Past Medical History:  Diagnosis Date  . COPD (chronic obstructive pulmonary disease) (HCC)   . Emphysema lung (HCC)   . Hx of long term use of blood thinners   . Hypertension   . Stroke [facility]) 2013  . Thyroid disease    hypothyroidism    Past Surgical History:  Procedure Laterality Date  . IR ANGIO EXTRACRAN SEL COM CAROTID INNOMINATE UNI R MOD SED  06/19/2017  . IR ANGIO VERTEBRAL SEL SUBCLAVIAN INNOMINATE UNI R MOD SED  06/19/2017  . IR GASTROSTOMY TUBE MOD SED  07/04/2017  . RADIOLOGY WITH ANESTHESIA N/A 06/19/2017   Procedure: RADIOLOGY WITH ANESTHESIA;  Surgeon: Julieanne Cotton, MD;  [facility]: [facility] OR;  Service: Radiology;  Laterality: N/A;    There were no vitals filed for this visit.  Subjective Assessment - 10/25/17 1316    Subjective   Pt. continues to present with severe communication deficits. Pt. continues to use gestures in reponse to questions.    Patient is accompained by:  Family member    Pertinent History  Pt. is a 77 y.o. male who was admitted to [facility] with a Right MCA Infarct with Lft Hemiparesis, and severe oropharyngeal dysphagia, and severe dysarthria.    Patient Stated Goals  Patient would like to be as independent as  possible.     Currently in Pain?  No/denies      OT TREATMENT    Neuro muscular re-education:  Pt. worked on bilateral hand coordination skills grasping small 1/2" flat washers, and placing them over various vertical dowels. Pt. required extensive cues for movement patterns, and motor planning. Pt. worked on grasping small 1/2" beads. Pt. worked on connecting, and fastening the beads using a 3pt. pinch. Pt. worked on disconnecting the beads against resistance using bilateral 3pt. Pinch.Pt. Required consistent verbal cues, and cues for visual demonstration.  Therapeutic Exercise:  Pt. worked on the [employer] for 8 min. With constant monitoring of the BUEs. Pt. worked on changing, and alternating forward reverse position every 2 min. rest breaks were required.                         OT Education - 10/25/17 1317    Education provided  Yes    Education Details  fine motor coordination skills    Person(s) Educated  Patient    Methods  Explanation;Demonstration    Comprehension  Verbalized understanding;Returned demonstration;Verbal cues required          OT Long Term Goals - 10/23/17 1342      OT LONG TERM GOAL #1   Title

## 2017-10-31 ENCOUNTER — Ambulatory Visit: Payer: Medicare Other | Admitting: Physical Therapy

## 2017-10-31 ENCOUNTER — Ambulatory Visit: Payer: Medicare Other | Admitting: Occupational Therapy

## 2017-10-31 ENCOUNTER — Ambulatory Visit: Payer: Medicare Other | Attending: Physical Medicine & Rehabilitation | Admitting: Speech Pathology

## 2017-10-31 ENCOUNTER — Encounter: Payer: Self-pay | Admitting: Occupational Therapy

## 2017-10-31 ENCOUNTER — Encounter: Payer: Self-pay | Admitting: Speech Pathology

## 2017-10-31 DIAGNOSIS — R278 Other lack of coordination: Secondary | ICD-10-CM

## 2017-10-31 DIAGNOSIS — M6281 Muscle weakness (generalized): Secondary | ICD-10-CM

## 2017-10-31 DIAGNOSIS — R1312 Dysphagia, oropharyngeal phase: Secondary | ICD-10-CM

## 2017-10-31 DIAGNOSIS — R471 Dysarthria and anarthria: Secondary | ICD-10-CM

## 2017-10-31 NOTE — Therapy (Signed)
[facility] MAIN [facility] SERVICES [address] [place], [place], [address] Phone: [phone]   Fax:  [phone]  Occupational Therapy Treatment  Patient Details  Name: Ronald [name] MRN: [id number] Date of Birth: January 19, 1941 Referring Provider: Dr. Wynn Banker   Encounter Date: 10/31/2017  OT End of Session - 10/31/17 1532    Visit Number  13    Number of Visits  24    Date for OT Re-Evaluation  11/28/17    Authorization Type  Visit 3 of 10 for progress report period starting 10/23/2017    OT Start Time  1515    OT Stop Time  1600    OT Time Calculation (min)  45 min    Activity Tolerance  Patient tolerated treatment well    Behavior During Therapy  [facility] for tasks assessed/performed       Past Medical History:  Diagnosis Date  . COPD (chronic obstructive pulmonary disease) (HCC)   . Emphysema lung (HCC)   . Hx of long term use of blood thinners   . Hypertension   . Stroke [facility]) 2013  . Thyroid disease    hypothyroidism    Past Surgical History:  Procedure Laterality Date  . IR ANGIO EXTRACRAN SEL COM CAROTID INNOMINATE UNI R MOD SED  06/19/2017  . IR ANGIO VERTEBRAL SEL SUBCLAVIAN INNOMINATE UNI R MOD SED  06/19/2017  . IR GASTROSTOMY TUBE MOD SED  07/04/2017  . RADIOLOGY WITH ANESTHESIA N/A 06/19/2017   Procedure: RADIOLOGY WITH ANESTHESIA;  Surgeon: Julieanne Cotton, MD;  Location: [facility] OR;  Service: Radiology;  Laterality: N/A;    There were no vitals filed for this visit.  Subjective Assessment - 10/31/17 1530    Subjective   Pt. continues to present with severe communication deficits. Pt. continues to use gestures in reponse to questions.    Patient is accompained by:  Family member    Pertinent History  Pt. is a 77 y.o. male who was admitted to [facility] with a Right MCA Infarct with Lft Hemiparesis, and severe oropharyngeal dysphagia, and severe dysarthria.    Patient Stated Goals  Patient would like to be as independent as  possible.     Currently in Pain?  Yes    Pain Score  5     Pain Location  Back      OT TREATMENT    Neuro muscular re-education:  Pt. worked on grasping 1" resistive cubes alternating thumb opposition to the tip of the 2nd through 5th digits while the board is placed at a vertical angle. Pt. worked on pressing the cubes back into place with the 2nd digit. Pt. worked on fine motor coordination skills grasping, and connecting 1/2" resistive beads. Pt. Worked on connecting them with 2pt. Grasp, and 3pt. Grasp. Pt. Worked on disconnecting them using a lateral grasp.  Therapeutic Exercise:  Pt. worked on the [employer] for 8 min. with constant monitoring of the BUEs. Pt. worked on changing, and alternating forward reverse position every 2 min. rest breaks were required. Pt. performed 2# dowel ex. For UE strengthening secondary to weakness. Bilateral shoulder flexion, chest press, circular patterns, and elbow flexion/extension were performed. Pt. required verbal cues to assist.                         OT Education - 10/31/17 1532    Education provided  Yes    Education Details  fine motor coordination skills  Person(s) Educated  Patient    Methods  Explanation;Demonstration    Comprehension  Verbalized understanding;Returned demonstration;Verbal cues required          OT Long Term Goals - 10/23/17 1342      OT LONG TERM GOAL #1   Title  Pt. will demonstrate independence with visual compensatory strategies during ADL, and IADL tasks.    Baseline  10/18/2017: Visual impairment present. Vision to be further assessed to determine how it may be affecting ADL/IADL functioning.    Time  12    Period  Weeks    Status  On-going    Target Date  11/28/17      OT LONG TERM GOAL #2   Title  Pt. will increase Left grip strength will increase by 10# to improve ADL/IADL functioning.    Baseline  10/18/2017: Limited LUE grip strength    Time  12    Period  Weeks    Status   On-going    Target Date  11/28/17      OT LONG TERM GOAL #3   Title  Pt. will improve FMC skills by 5 sec. of speed to be able to manipulate ADL/IADL objects.    Baseline  10/18/2017: Limited left hand coordination skills.    Time  12    Period  Weeks    Status  On-going    Target Date  11/28/17      OT LONG TERM GOAL #4   Title  Pt. will accurately identify 10/10 home safety hazards for ADLs, and IADLs    Baseline  10/18/2017: Limited.    Time  12    Period  Weeks    Status  On-going    Target Date  11/28/17            Plan - 10/31/17 1533    Clinical Impression Statement  Pt. continues to present with severe communication impairments. Pt. continues to present with limited UE strength, and fine motor coordination skills. Pt. requires cues to perform the task slowly, and for the movement patterns.     Occupational Profile and client history currently impacting functional performance  Pt. resides with his daughter.    Occupational performance deficits (Please refer to evaluation for details):  ADL's;IADL's    Rehab Potential  Poor    Current Impairments/barriers affecting progress:  Positive Indicators: age, family support, Negative Indicators: multiple comorbidities    OT Frequency  2x / week    OT Duration  12 weeks    OT Treatment/Interventions  Self-care/ADL training;Therapeutic exercise;Therapeutic activities;Neuromuscular education;DME and/or AE instruction;Patient/family education;Energy conservation    Clinical Decision Making  Several treatment options, min-mod task modification necessary    Consulted and Agree with Plan of Care  Patient       Patient will benefit from skilled therapeutic intervention in order to improve the following deficits and impairments:  Impaired UE functional use, Decreased activity tolerance, Decreased endurance, Decreased coordination, Impaired vision/preception, Decreased cognition, Decreased strength  Visit Diagnosis: Other lack of  coordination  Muscle weakness (generalized)    Problem List Patient Active Problem List   Diagnosis Date Noted  . PEG (percutaneous endoscopic gastrostomy) status (HCC)   . Benign essential HTN   . Hypertensive crisis   . Dysarthria, post-stroke   . Acute pain of right shoulder   . 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 Dysphagia, oropharyngeal 06/23/2017  . Respiratory failure (HCC)   . Palliative care encounter   . Aspiration pneumonia due  to gastric secretions (HCC)   . Atrial fibrillation (HCC)   . 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

## 2017-10-31 NOTE — Therapy (Signed)
Palliative care encounter   . Aspiration pneumonia due to gastric secretions ([place])   . Atrial fibrillation ([place])   . Stroke (cerebrum) ([place]) 06/19/2017    [name] 10/31/2017, 5:12 PM  [place] MAIN [facility] SERVICES [address] [place], [place], [address] Phone: [phone]   Fax:  [phone]   Name: Ronald [name] MRN: [id number] Date of Birth: Nov 25, 1940  Palliative care encounter   . Aspiration pneumonia due to gastric secretions ([place])   . Atrial fibrillation ([place])   . Stroke (cerebrum) ([place]) 06/19/2017    [name] 10/31/2017, 5:12 PM  [place] MAIN [facility] SERVICES [address] [place], [place], [address] Phone: [phone]   Fax:  [phone]   Name: Ronald [name] MRN: [id number] Date of Birth: Nov 25, 1940  Palliative care encounter   . Aspiration pneumonia due to gastric secretions ([place])   . Atrial fibrillation ([place])   . Stroke (cerebrum) ([place]) 06/19/2017    [name] 10/31/2017, 5:12 PM  [place] MAIN [facility] SERVICES [address] [place], [place], [address] Phone: [phone]   Fax:  [phone]   Name: Ronald [name] MRN: [id number] Date of Birth: Nov 25, 1940

## 2017-11-06 ENCOUNTER — Ambulatory Visit: Payer: Self-pay | Admitting: Physical Therapy

## 2017-11-06 ENCOUNTER — Ambulatory Visit: Payer: Medicare Other | Admitting: Occupational Therapy

## 2017-11-06 ENCOUNTER — Ambulatory Visit: Payer: Medicare Other | Admitting: Speech Pathology

## 2017-11-06 ENCOUNTER — Encounter: Payer: Self-pay | Admitting: Occupational Therapy

## 2017-11-06 ENCOUNTER — Encounter: Payer: Self-pay | Admitting: Speech Pathology

## 2017-11-06 ENCOUNTER — Encounter: Payer: Medicare Other | Admitting: Occupational Therapy

## 2017-11-06 DIAGNOSIS — R471 Dysarthria and anarthria: Secondary | ICD-10-CM | POA: Diagnosis not present

## 2017-11-06 DIAGNOSIS — M6281 Muscle weakness (generalized): Secondary | ICD-10-CM | POA: Diagnosis not present

## 2017-11-06 DIAGNOSIS — R1312 Dysphagia, oropharyngeal phase: Secondary | ICD-10-CM

## 2017-11-06 DIAGNOSIS — R278 Other lack of coordination: Secondary | ICD-10-CM | POA: Diagnosis not present

## 2017-11-06 NOTE — Therapy (Signed)
[place] MAIN [facility] SERVICES [address] [place], [place], [address] Phone: [phone]   Fax:  [phone]  Speech Language Pathology Treatment  Patient Details  Name: Ronald [name] MRN: [id number] Date of Birth: 1940/07/07 Referring Provider: Charlett Blake   Encounter Date: 11/06/2017  End of Session - 11/06/17 1602    Visit Number  21    Number of Visits  33    Date for SLP Re-Evaluation  12/16/17    SLP Start Time  1500    SLP Stop Time   1555    SLP Time Calculation (min)  55 min    Activity Tolerance  Patient tolerated treatment well       Past Medical History:  Diagnosis Date  . COPD (chronic obstructive pulmonary disease) ([place])   . Emphysema lung ([place])   . Hx of long term use of blood thinners   . Hypertension   . Stroke [facility]) 2013  . Thyroid disease    hypothyroidism    Past Surgical History:  Procedure Laterality Date  . IR ANGIO EXTRACRAN SEL COM CAROTID INNOMINATE UNI R MOD SED  06/19/2017  . IR ANGIO VERTEBRAL SEL SUBCLAVIAN INNOMINATE UNI R MOD SED  06/19/2017  . IR GASTROSTOMY TUBE MOD SED  07/04/2017  . RADIOLOGY WITH ANESTHESIA N/A 06/19/2017   Procedure: RADIOLOGY WITH ANESTHESIA;  Surgeon: Luanne Bras, MD;  Location: [place];  Service: Radiology;  Laterality: N/A;    There were no vitals filed for this visit.  Subjective Assessment - 11/06/17 1602    Subjective  Engaged and talkative. He was able to explain himself in conversation and his intelligibility improved when he spoke in a louder voice.     Currently in Pain?  No/denies    Pain Score  0-No pain    Multiple Pain Sites  No            ADULT SLP TREATMENT - 11/06/17 0001      General Information   Behavior/Cognition  Alert;Cooperative;Pleasant mood;Distractible    HPI  77 year old man, with history of prior stroke with speech and language deficits, with right hemisphere CVA 06/19/2017 with profound dysarthria and severe oropharyngeal  dysphagia.  The patient received inpatient rehab services 06/23/2017 - 07/04/2017 and home health services on discharge.         Treatment Provided   Treatment provided  Dysphagia;Cognitive-Linquistic      Pain Assessment   Pain Assessment  No/denies pain      Cognitive-Linquistic Treatment   Treatment focused on  Dysarthria;Apraxia;Voice;Other (comment);Patient/family/caregiver education Dysphagia    Skilled Treatment   SWALLOWING:   Patient returned with completed log.   Today, the patient took 4 ounces of thin liquid, water, using a spoon full for 2 oz and 10 mL at a time for 2 oz. He coughed in 1/6 trials using the spoon, when sipping 10 mL at a time, he coughed in 1/6 trials but was able to clear the liquid and maintain clear voice quality. He did not cough when prompted to swallow twice. PHONATION:  Patient able to imitate words/phrases, maintaining loudness for all words with 95% accuracy. In conversation, he was able to maintain loudness and express himself using voice and gestures. He sometimes deletes syllables which makes him difficult to understand. He improves with prompting and visual cues from the therapist.  His intelligibility is improving.      Assessment / Recommendations / Plan   Plan  Continue with  [place] MAIN [facility] SERVICES [address] [place], [place], [address] Phone: [phone]   Fax:  [phone]  Speech Language Pathology Treatment  Patient Details  Name: Ronald [name] MRN: [id number] Date of Birth: 1941-04-29 Referring Provider: Charlett Blake   Encounter Date: 11/06/2017  End of Session - 11/06/17 1602    Visit Number  21    Number of Visits  33    Date for SLP Re-Evaluation  12/16/17    SLP Start Time  1500    SLP Stop Time   1555    SLP Time Calculation (min)  55 min    Activity Tolerance  Patient tolerated treatment well       Past Medical History:  Diagnosis Date  . COPD (chronic obstructive pulmonary disease) ([place])   . Emphysema lung ([place])   . Hx of long term use of blood thinners   . Hypertension   . Stroke [facility]) 2013  . Thyroid disease    hypothyroidism    Past Surgical History:  Procedure Laterality Date  . IR ANGIO EXTRACRAN SEL COM CAROTID INNOMINATE UNI R MOD SED  06/19/2017  . IR ANGIO VERTEBRAL SEL SUBCLAVIAN INNOMINATE UNI R MOD SED  06/19/2017  . IR GASTROSTOMY TUBE MOD SED  07/04/2017  . RADIOLOGY WITH ANESTHESIA N/A 06/19/2017   Procedure: RADIOLOGY WITH ANESTHESIA;  Surgeon: Luanne Bras, MD;  Location: [place];  Service: Radiology;  Laterality: N/A;    There were no vitals filed for this visit.  Subjective Assessment - 11/06/17 1602    Subjective  Engaged and talkative. He was able to explain himself in conversation and his intelligibility improved when he spoke in a louder voice.     Currently in Pain?  No/denies    Pain Score  0-No pain    Multiple Pain Sites  No            ADULT SLP TREATMENT - 11/06/17 0001      General Information   Behavior/Cognition  Alert;Cooperative;Pleasant mood;Distractible    HPI  77 year old man, with history of prior stroke with speech and language deficits, with right hemisphere CVA 06/19/2017 with profound dysarthria and severe oropharyngeal  dysphagia.  The patient received inpatient rehab services 06/23/2017 - 07/04/2017 and home health services on discharge.         Treatment Provided   Treatment provided  Dysphagia;Cognitive-Linquistic      Pain Assessment   Pain Assessment  No/denies pain      Cognitive-Linquistic Treatment   Treatment focused on  Dysarthria;Apraxia;Voice;Other (comment);Patient/family/caregiver education Dysphagia    Skilled Treatment   SWALLOWING:   Patient returned with completed log.   Today, the patient took 4 ounces of thin liquid, water, using a spoon full for 2 oz and 10 mL at a time for 2 oz. He coughed in 1/6 trials using the spoon, when sipping 10 mL at a time, he coughed in 1/6 trials but was able to clear the liquid and maintain clear voice quality. He did not cough when prompted to swallow twice. PHONATION:  Patient able to imitate words/phrases, maintaining loudness for all words with 95% accuracy. In conversation, he was able to maintain loudness and express himself using voice and gestures. He sometimes deletes syllables which makes him difficult to understand. He improves with prompting and visual cues from the therapist.  His intelligibility is improving.      Assessment / Recommendations / Plan   Plan  Continue with  [place] MAIN [facility] SERVICES [address] [place], [place], [address] Phone: [phone]   Fax:  [phone]  Speech Language Pathology Treatment  Patient Details  Name: Ronald [name] MRN: [id number] Date of Birth: [birth year] Referring Provider: Charlett Blake   Encounter Date: 11/06/2017  End of Session - 11/06/17 1602    Visit Number  21    Number of Visits  33    Date for SLP Re-Evaluation  12/16/17    SLP Start Time  1500    SLP Stop Time   1555    SLP Time Calculation (min)  55 min    Activity Tolerance  Patient tolerated treatment well       Past Medical History:  Diagnosis Date  . COPD (chronic obstructive pulmonary disease) ([place])   . Emphysema lung ([place])   . Hx of long term use of blood thinners   . Hypertension   . Stroke [facility]) 2013  . Thyroid disease    hypothyroidism    Past Surgical History:  Procedure Laterality Date  . IR ANGIO EXTRACRAN SEL COM CAROTID INNOMINATE UNI R MOD SED  06/19/2017  . IR ANGIO VERTEBRAL SEL SUBCLAVIAN INNOMINATE UNI R MOD SED  06/19/2017  . IR GASTROSTOMY TUBE MOD SED  07/04/2017  . RADIOLOGY WITH ANESTHESIA N/A 06/19/2017   Procedure: RADIOLOGY WITH ANESTHESIA;  Surgeon: Luanne Bras, MD;  Location: [place];  Service: Radiology;  Laterality: N/A;    There were no vitals filed for this visit.  Subjective Assessment - 11/06/17 1602    Subjective  Engaged and talkative. He was able to explain himself in conversation and his intelligibility improved when he spoke in a louder voice.     Currently in Pain?  No/denies    Pain Score  0-No pain    Multiple Pain Sites  No            ADULT SLP TREATMENT - 11/06/17 0001      General Information   Behavior/Cognition  Alert;Cooperative;Pleasant mood;Distractible    HPI  77 year old man, with history of prior stroke with speech and language deficits, with right hemisphere CVA 06/19/2017 with profound dysarthria and severe oropharyngeal  dysphagia.  The patient received inpatient rehab services 06/23/2017 - 07/04/2017 and home health services on discharge.         Treatment Provided   Treatment provided  Dysphagia;Cognitive-Linquistic      Pain Assessment   Pain Assessment  No/denies pain      Cognitive-Linquistic Treatment   Treatment focused on  Dysarthria;Apraxia;Voice;Other (comment);Patient/family/caregiver education Dysphagia    Skilled Treatment   SWALLOWING:   Patient returned with completed log.   Today, the patient took 4 ounces of thin liquid, water, using a spoon full for 2 oz and 10 mL at a time for 2 oz. He coughed in 1/6 trials using the spoon, when sipping 10 mL at a time, he coughed in 1/6 trials but was able to clear the liquid and maintain clear voice quality. He did not cough when prompted to swallow twice. PHONATION:  Patient able to imitate words/phrases, maintaining loudness for all words with 95% accuracy. In conversation, he was able to maintain loudness and express himself using voice and gestures. He sometimes deletes syllables which makes him difficult to understand. He improves with prompting and visual cues from the therapist.  His intelligibility is improving.      Assessment / Recommendations / Plan   Plan  Continue with

## 2017-11-06 NOTE — Therapy (Addendum)
[facility] MAIN [facility] SERVICES [address] [place], [place], [address] Phone: [phone]   Fax:  [phone]  Occupational Therapy Treatment  Patient Details  Name: Ronald [name] MRN: [id number] Date of Birth: 1941-01-26 Referring Provider: Dr. Wynn Banker   Encounter Date: 11/06/2017  OT End of Session - 11/06/17 1437    Visit Number  14    Number of Visits  24    Date for OT Re-Evaluation  11/28/17    Authorization Type  Visit 4 of 10 for progress report period starting 10/23/2017    OT Start Time  1430    OT Stop Time  1500    OT Time Calculation (min)  30 min    Activity Tolerance  Patient tolerated treatment well    Behavior During Therapy  [facility] for tasks assessed/performed       Past Medical History:  Diagnosis Date  . COPD (chronic obstructive pulmonary disease) (HCC)   . Emphysema lung (HCC)   . Hx of long term use of blood thinners   . Hypertension   . Stroke [facility]) 2013  . Thyroid disease    hypothyroidism    Past Surgical History:  Procedure Laterality Date  . IR ANGIO EXTRACRAN SEL COM CAROTID INNOMINATE UNI R MOD SED  06/19/2017  . IR ANGIO VERTEBRAL SEL SUBCLAVIAN INNOMINATE UNI R MOD SED  06/19/2017  . IR GASTROSTOMY TUBE MOD SED  07/04/2017  . RADIOLOGY WITH ANESTHESIA N/A 06/19/2017   Procedure: RADIOLOGY WITH ANESTHESIA;  Surgeon: Julieanne Cotton, MD;  Location: [facility] OR;  Service: Radiology;  Laterality: N/A;    There were no vitals filed for this visit.  Subjective Assessment - 11/06/17 1435    Subjective   Pt. continues to present with severe communication deficits. Pt. continues to use gestures in reponse to questions.    Patient is accompained by:  Family member    Pertinent History  Pt. is a 77 y.o. male who was admitted to [facility] with a Right MCA Infarct with Lft Hemiparesis, and severe oropharyngeal dysphagia, and severe dysarthria.    Patient Stated Goals  Patient would like to be as independent as  possible.     Currently in Pain?  No/denies      OT TREATMENT    Neuro muscular re-education:  Pt. Worked on grasping coins from a tabletop surface, placing them into a resistive container, and pushing them through the slot while isolating his 2nd digit. Pt. Worked on translatory movement of the hand. Pt. performed [facility] tasks using the Grooved pegboard. Pt. worked on grasping the grooved pegs from a horizontal position, and moving the pegs to a vertical position in the hand to prepare for placing them in the grooved slot. Pt. requires cues for upright positioning in the chair. Pt. worked on bilateral [facility] skills needed to grasp small resistive beads. Pt. worked on connecting the beads using a 3pt. pinch, and pt. Pinch grasp. Pt. worked on disconnecting the resistive beads using a lateral pinch grasp, and 3pt. pinch grasp.   Therapeutic Exercise:  Pt. performed gross gripping with grip strengthener. Pt. worked on sustaining grip while grasping pegs and reaching at various heights. Gripper was placed in the 3rd resistive slot with the white resistive spring.                        OT Education - 11/06/17 1437    Education provided  Yes    Education  Details  fine motor coordination skills    Person(s) Educated  Patient    Methods  Explanation;Demonstration    Comprehension  Verbalized understanding;Returned demonstration;Verbal cues required          OT Long Term Goals - 10/23/17 1342      OT LONG TERM GOAL #1   Title  Pt. will demonstrate independence with visual compensatory strategies during ADL, and IADL tasks.    Baseline  10/18/2017: Visual impairment present. Vision to be further assessed to determine how it may be affecting ADL/IADL functioning.    Time  12    Period  Weeks    Status  On-going    Target Date  11/28/17      OT LONG TERM GOAL #2   Title  Pt. will increase Left grip strength will increase by 10# to improve ADL/IADL functioning.    Baseline   10/18/2017: Limited LUE grip strength    Time  12    Period  Weeks    Status  On-going    Target Date  11/28/17      OT LONG TERM GOAL #3   Title  Pt. will improve FMC skills by 5 sec. of speed to be able to manipulate ADL/IADL objects.    Baseline  10/18/2017: Limited left hand coordination skills.    Time  12    Period  Weeks    Status  On-going    Target Date  11/28/17      OT LONG TERM GOAL #4   Title  Pt. will accurately identify 10/10 home safety hazards for ADLs, and IADLs    Baseline  10/18/2017: Limited.    Time  12    Period  Weeks    Status  On-going    Target Date  11/28/17            Plan - 11/06/17 1438    Clinical Impression Statement Pt. continues to require extensive cues, and visual demonstration for movement patterns during UE strength, and FMC tasks. Pt. continues to work on improving UE strength, and [facility] skills in order to improve functioning during ADLs, and IADLs.    Occupational Profile and client history currently impacting functional performance  Pt. resides with his daughter.    Occupational performance deficits (Please refer to evaluation for details):  ADL's;IADL's    Rehab Potential  Poor    Current Impairments/barriers affecting progress:  Positive Indicators: age, family support, Negative Indicators: multiple comorbidities    OT Frequency  2x / week    OT Duration  12 weeks    OT Treatment/Interventions  Self-care/ADL training;Therapeutic exercise;Therapeutic activities;Neuromuscular education;DME and/or AE instruction;Patient/family education;Energy conservation    Clinical Decision Making  Several treatment options, min-mod task modification necessary    Consulted and Agree with Plan of Care  Patient       Patient will benefit from skilled therapeutic intervention in order to improve the following deficits and impairments:  Impaired UE functional use, Decreased activity tolerance, Decreased endurance, Decreased coordination, Impaired  vision/preception, Decreased cognition, Decreased strength  Visit Diagnosis: Muscle weakness (generalized)  Other lack of coordination    Problem List Patient Active Problem List   Diagnosis Date Noted  . PEG (percutaneous endoscopic gastrostomy) status (HCC)   . Benign essential HTN   . Hypertensive crisis   . Dysarthria, post-stroke   . Acute pain of right shoulder   . 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 Dysphagia, oropharyngeal 06/23/2017  . Respiratory failure (HCC)   .  Palliative care encounter   . Aspiration pneumonia due to gastric secretions (HCC)   . Atrial fibrillation (HCC)   . 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 [place], [place], [address] Phone: [phone]   Fax:  [phone]  Name: Ronald [name] MRN: [id number] Date of Birth: 07/06/1940

## 2017-11-08 ENCOUNTER — Encounter: Payer: Medicare Other | Admitting: Occupational Therapy

## 2017-11-08 ENCOUNTER — Encounter: Payer: Medicare Other | Admitting: Speech Pathology

## 2017-11-09 ENCOUNTER — Ambulatory Visit: Payer: Self-pay | Admitting: Physical Therapy

## 2017-11-09 ENCOUNTER — Encounter: Payer: Medicare Other | Admitting: Speech Pathology

## 2017-11-09 ENCOUNTER — Encounter: Payer: Medicare Other | Admitting: Occupational Therapy

## 2017-11-13 ENCOUNTER — Encounter: Payer: Self-pay | Admitting: Physical Medicine & Rehabilitation

## 2017-11-13 ENCOUNTER — Ambulatory Visit: Payer: Medicare Other | Admitting: Speech Pathology

## 2017-11-13 ENCOUNTER — Ambulatory Visit: Payer: Medicare Other | Admitting: Occupational Therapy

## 2017-11-13 ENCOUNTER — Encounter: Payer: Self-pay | Admitting: Occupational Therapy

## 2017-11-13 ENCOUNTER — Encounter: Payer: Medicare Other | Attending: Physical Medicine & Rehabilitation

## 2017-11-13 ENCOUNTER — Ambulatory Visit (HOSPITAL_BASED_OUTPATIENT_CLINIC_OR_DEPARTMENT_OTHER): Payer: Medicare Other | Admitting: Physical Medicine & Rehabilitation

## 2017-11-13 ENCOUNTER — Ambulatory Visit: Payer: Self-pay | Admitting: Physical Therapy

## 2017-11-13 VITALS — BP 149/72 | HR 62 | Ht 70.0 in | Wt 173.0 lb

## 2017-11-13 DIAGNOSIS — I10 Essential (primary) hypertension: Secondary | ICD-10-CM | POA: Insufficient documentation

## 2017-11-13 DIAGNOSIS — R1312 Dysphagia, oropharyngeal phase: Secondary | ICD-10-CM | POA: Insufficient documentation

## 2017-11-13 DIAGNOSIS — R471 Dysarthria and anarthria: Secondary | ICD-10-CM | POA: Diagnosis not present

## 2017-11-13 DIAGNOSIS — I69322 Dysarthria following cerebral infarction: Secondary | ICD-10-CM | POA: Insufficient documentation

## 2017-11-13 DIAGNOSIS — J449 Chronic obstructive pulmonary disease, unspecified: Secondary | ICD-10-CM | POA: Diagnosis not present

## 2017-11-13 DIAGNOSIS — M6281 Muscle weakness (generalized): Secondary | ICD-10-CM | POA: Diagnosis not present

## 2017-11-13 DIAGNOSIS — R278 Other lack of coordination: Secondary | ICD-10-CM | POA: Diagnosis not present

## 2017-11-13 DIAGNOSIS — I69398 Other sequelae of cerebral infarction: Secondary | ICD-10-CM | POA: Diagnosis not present

## 2017-11-13 DIAGNOSIS — E039 Hypothyroidism, unspecified: Secondary | ICD-10-CM | POA: Insufficient documentation

## 2017-11-13 DIAGNOSIS — I639 Cerebral infarction, unspecified: Secondary | ICD-10-CM | POA: Diagnosis not present

## 2017-11-13 DIAGNOSIS — Z87891 Personal history of nicotine dependence: Secondary | ICD-10-CM | POA: Diagnosis not present

## 2017-11-13 DIAGNOSIS — I4891 Unspecified atrial fibrillation: Secondary | ICD-10-CM | POA: Diagnosis not present

## 2017-11-13 DIAGNOSIS — Z931 Gastrostomy status: Secondary | ICD-10-CM | POA: Diagnosis not present

## 2017-11-13 DIAGNOSIS — R269 Unspecified abnormalities of gait and mobility: Secondary | ICD-10-CM | POA: Diagnosis not present

## 2017-11-13 DIAGNOSIS — Z7901 Long term (current) use of anticoagulants: Secondary | ICD-10-CM | POA: Diagnosis not present

## 2017-11-13 NOTE — Therapy (Signed)
[facility] MAIN [facility] SERVICES [address] [place], [place], [address] Phone: [phone]   Fax:  [phone]  Occupational Therapy Treatment  Patient Details  Name: Ronald [name] MRN: [id number] Date of Birth: 08/22/1940 Referring Provider: Dr. Wynn Banker   Encounter Date: 11/13/2017  OT End of Session - 11/13/17 1407    Visit Number  15    Number of Visits  24    Date for OT Re-Evaluation  11/28/17    Authorization Type  Visit 5 of 10 for progress report period starting 10/23/2017    OT Start Time  1400    OT Stop Time  1445    OT Time Calculation (min)  45 min    Activity Tolerance  Patient tolerated treatment well    Behavior During Therapy  [facility] for tasks assessed/performed       Past Medical History:  Diagnosis Date  . COPD (chronic obstructive pulmonary disease) (HCC)   . Emphysema lung (HCC)   . Hx of long term use of blood thinners   . Hypertension   . Stroke [facility]) 2013  . Thyroid disease    hypothyroidism    Past Surgical History:  Procedure Laterality Date  . IR ANGIO EXTRACRAN SEL COM CAROTID INNOMINATE UNI R MOD SED  06/19/2017  . IR ANGIO VERTEBRAL SEL SUBCLAVIAN INNOMINATE UNI R MOD SED  06/19/2017  . IR GASTROSTOMY TUBE MOD SED  07/04/2017  . RADIOLOGY WITH ANESTHESIA N/A 06/19/2017   Procedure: RADIOLOGY WITH ANESTHESIA;  Surgeon: Julieanne Cotton, MD;  Location: [facility] OR;  Service: Radiology;  Laterality: N/A;    There were no vitals filed for this visit.  Subjective Assessment - 11/13/17 1407    Subjective   Pt. continues to present with severe communication deficits. Pt. continues to use gestures in reponse to questions.    Patient is accompained by:  Family member    Pertinent History  Pt. is a 77 y.o. male who was admitted to [facility] with a Right MCA Infarct with Lft Hemiparesis, and severe oropharyngeal dysphagia, and severe dysarthria.    Patient Stated Goals  Patient would like to be as independent as  possible.     Currently in Pain?  No/denies       OT TREATMENT    Neuro muscular re-education:  Pt. worked on grasping, flipping and stacking 2" large pegs on the Instructo board placed at a tabletop surface. Pt. worked on grasping one inch resistive cubes alternating thumb opposition to the tip of the 2nd through 5th digits. The board was positioned at a vertical angle. Pt. worked on pressing them back into place with the thumb and 5th digit. Pt. worked on bilateral [facility] skills needed to grasp small resistive beads. Pt. worked on connecting the beads using a 3pt. pinch, and pt. pinch grasp. Pt. worked on disconnecting the resistive beads using a lateral pinch grasp, and 3pt. pinch grasp. Pt. worked on bilateral [facility] skills needed to grasp small resistive beads. Pt. worked on connecting the beads using a 3pt. pinch, and pt. Pinch grasp. Pt. worked on disconnecting the resistive beads using a lateral pinch grasp, and 3pt. pinch grasp.                        OT Education - 11/13/17 1407    Education provided  Yes    Education Details  fine motor coordination skills    Person(s) Educated  Patient  Methods  Explanation;Demonstration    Comprehension  Verbalized understanding;Returned demonstration;Verbal cues required          OT Long Term Goals - 10/23/17 1342      OT LONG TERM GOAL #1   Title  Pt. will demonstrate independence with visual compensatory strategies during ADL, and IADL tasks.    Baseline  10/18/2017: Visual impairment present. Vision to be further assessed to determine how it may be affecting ADL/IADL functioning.    Time  12    Period  Weeks    Status  On-going    Target Date  11/28/17      OT LONG TERM GOAL #2   Title  Pt. will increase Left grip strength will increase by 10# to improve ADL/IADL functioning.    Baseline  10/18/2017: Limited LUE grip strength    Time  12    Period  Weeks    Status  On-going    Target Date  11/28/17      OT LONG  TERM GOAL #3   Title  Pt. will improve FMC skills by 5 sec. of speed to be able to manipulate ADL/IADL objects.    Baseline  10/18/2017: Limited left hand coordination skills.    Time  12    Period  Weeks    Status  On-going    Target Date  11/28/17      OT LONG TERM GOAL #4   Title  Pt. will accurately identify 10/10 home safety hazards for ADLs, and IADLs    Baseline  10/18/2017: Limited.    Time  12    Period  Weeks    Status  On-going    Target Date  11/28/17            Plan - 11/13/17 1408    Clinical Impression Statement  Pt.'s family member reports that the pt. had an MD appointment, and reported that they will most likely remove the feeding tube in one month. Pt. continues to work on improving BUE strengthening, hand function, and coordination skills. Pt. continues to require verbal cues, visual demonstration, and assist for ADL, and IADL functioning.    Occupational Profile and client history currently impacting functional performance  Pt. resides with his daughter.    Occupational performance deficits (Please refer to evaluation for details):  ADL's;IADL's    Rehab Potential  Poor    Current Impairments/barriers affecting progress:  Positive Indicators: age, family support, Negative Indicators: multiple comorbidities    OT Frequency  2x / week    OT Duration  12 weeks    OT Treatment/Interventions  Self-care/ADL training;Therapeutic exercise;Therapeutic activities;Neuromuscular education;DME and/or AE instruction;Patient/family education;Energy conservation    Clinical Decision Making  Several treatment options, min-mod task modification necessary    Consulted and Agree with Plan of Care  Patient       Patient will benefit from skilled therapeutic intervention in order to improve the following deficits and impairments:  Impaired UE functional use, Decreased activity tolerance, Decreased endurance, Decreased coordination, Impaired vision/preception, Decreased cognition,  Decreased strength  Visit Diagnosis: Muscle weakness (generalized)    Problem List Patient Active Problem List   Diagnosis Date Noted  . PEG (percutaneous endoscopic gastrostomy) status (HCC)   . Benign essential HTN   . Hypertensive crisis   . Dysarthria, post-stroke   . Acute pain of right shoulder   . 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 Dysphagia, oropharyngeal 06/23/2017  . Respiratory failure (HCC)   . Palliative care encounter   .  Aspiration pneumonia due to gastric secretions (HCC)   . Atrial fibrillation (HCC)   . 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 [place], [place], [address] Phone: [phone]   Fax:  [phone]  Name: Ronald [name] MRN: [id number] Date of Birth: [birth year]

## 2017-11-13 NOTE — Patient Instructions (Addendum)
Reduce water flush to 100cc 4 times a day Increase fluid intake by 12-16oz per day

## 2017-11-13 NOTE — Progress Notes (Signed)
Subjective:    Patient ID: Ronald [name], male    DOB: 28-Oct-1940, 77 y.o.   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  He lives with male companion reportedly mobile and independent.  Plans to stay with daughter on discharge.  Cranial CT scan negative for acute changes, old left posterior frontal infarction.  The patient did receive tPA.  INR on admission of 1.23.  CT angiogram of head and neck showed apparent occlusion of right insular anterior temporal M2 branch.  CT cerebral perfusion scan showed acute changes in the right MCA territory compatible with infarction.  Echocardiogram with ejection fraction 60%.  No wall motion abnormalities.  Carotid arteriogram on June 19, 2017, showed no acute occlusions, AV shunting, or aneurysm.  The patient did require short-term intubation. MRI showed a 3.2 cm acute hemorrhage within the right lateral temporal lobe.  CT of the head showed evolving multifocal right MCA territory infarct with evolving focal hemorrhagic conversion in the right temporal lobe.  Aspirin for CVA prophylaxis was initially on hold as well as Coumadin   HPI Patient receiving outpatient PT OT and speech.  He is now taking meals 100% without tube feeds Still getting H20 flush QID, taking pills by mouth Pain Inventory Average Pain 3 Pain Right Now 0 My pain is na  In the last 24 hours, has pain interfered with the following? General activity 0 Relation with others 0 Enjoyment of life 0 What TIME of day is your pain at its worst? na Sleep (in general) Good  Pain is worse with: na Pain improves with: na Relief from Meds: na  Mobility walk without assistance  Function disabled: date disabled .  Neuro/Psych No problems in  this area  Prior Studies Any changes since last visit?  no  Physicians involved in your care Any changes since last visit?  no   Family History  Problem Relation Age of Onset  . Parkinson's disease Mother   . Colon cancer Mother   . Emphysema Father   . Stroke Sister   . Stroke Brother    Social History   Socioeconomic History  . Marital status: Single    Spouse name: Not on file  . Number of children: Not on file  . Years of education: Not on file  . Highest education level: Not on file  Occupational History  . Not on file  Social Needs  . Financial resource strain: Not on file  . Food insecurity:    Worry: Not on file    Inability: Not on file  . Transportation needs:    Medical: Not on file    Non-medical: Not on file  Tobacco Use  . Smoking status: Former Smoker    Last attempt to quit: 06/05/2017    Years since quitting: 0.4  . Smokeless tobacco: Former Engineer, water and Sexual Activity  . Alcohol use: Not Currently  . Drug use: No  . Sexual activity: Not on file  Lifestyle  . Physical activity:    Days per week: Not on file    Minutes per session: Not on file  . Stress: Not on file  Relationships  . Social connections:    Talks on phone: Not on file    Gets together: Not on file    Attends religious service: Not  on file    Active member of club or organization: Not on file    Attends meetings of clubs or organizations: Not on file    Relationship status: Not on file  Other Topics Concern  . Not on file  Social History Narrative  . Not on file   Past Surgical History:  Procedure Laterality Date  . IR ANGIO EXTRACRAN SEL COM CAROTID INNOMINATE UNI R MOD SED  06/19/2017  . IR ANGIO VERTEBRAL SEL SUBCLAVIAN INNOMINATE UNI R MOD SED  06/19/2017  . IR GASTROSTOMY TUBE MOD SED  07/04/2017  . RADIOLOGY WITH ANESTHESIA N/A 06/19/2017   Procedure: RADIOLOGY WITH ANESTHESIA;  Surgeon: Julieanne Cotton, MD;  Location: [facility] OR;  Service: Radiology;  Laterality:  N/A;   Past Medical History:  Diagnosis Date  . COPD (chronic obstructive pulmonary disease) (HCC)   . Emphysema lung (HCC)   . Hx of long term use of blood thinners   . Hypertension   . Stroke [facility]) 2013  . Thyroid disease    hypothyroidism   There were no vitals taken for this visit.  Opioid Risk Score:   Fall Risk Score:  `1  Depression screen PHQ 2/9  Depression screen PHQ 2/9 10/02/2017  Decreased Interest 0  Down, Depressed, Hopeless 0  PHQ - 2 Score 0     Review of Systems  Constitutional: Negative.   HENT: Negative.   Eyes: Negative.   Respiratory: Negative.   Cardiovascular: Negative.   Gastrointestinal: Negative.   Endocrine: Negative.   Genitourinary: Negative.   Musculoskeletal: Negative.   Allergic/Immunologic: Negative.   Neurological: Negative.   Hematological: Negative.   Psychiatric/Behavioral: Negative.   All other systems reviewed and are negative.      Objective:   Physical Exam  Constitutional: He is oriented to person, place, and time. He appears well-developed and well-nourished. No distress.  HENT:  Head: Normocephalic and atraumatic.  Eyes: Pupils are equal, round, and reactive to light. EOM are normal.  Neck: Normal range of motion.  Cardiovascular: Normal rate, regular rhythm and normal heart sounds.  No murmur heard. Pulmonary/Chest: Effort normal and breath sounds normal. No respiratory distress.  Abdominal: Soft. Bowel sounds are normal. He exhibits no distension. There is no tenderness.  PEG site CDI  Neurological: He is alert and oriented to person, place, and time. He has normal strength. Coordination and gait normal.  Speech dysarthric  Skin: Skin is warm and dry. He is not diaphoretic.  Psychiatric: He has a normal mood and affect. His behavior is normal. Judgment and thought content normal.  Nursing note and vitals reviewed.         Assessment & Plan:  1.  Hx RMCA infarct with dysarthria, dysphagia. Now maintaining  weight with po intake.  Still getting ~1286ml per day with H20 flush , takes 750-1026ml po per day Discussed with daughter , need to increase [po fluids.  Add another per day Reduce flush to QID  Return to clinic 1 month.  If patient is able to sustain calorie and fluid intake would refer to IR to remove G-tube

## 2017-11-14 ENCOUNTER — Encounter: Payer: Self-pay | Admitting: Speech Pathology

## 2017-11-14 NOTE — Therapy (Signed)
[place] MAIN [facility] SERVICES [address] [place], [place], [address] Phone: [phone]   Fax:  [phone]  Speech Language Pathology Treatment  Patient Details  Name: Ronald [name] MRN: [id number] Date of Birth: Sep 25, 1940 Referring Provider: Charlett Blake   Encounter Date: 11/13/2017  End of Session - 11/14/17 1038    Visit Number  22    Number of Visits  33    Date for SLP Re-Evaluation  12/16/17    SLP Start Time  1500    SLP Stop Time   1550    SLP Time Calculation (min)  50 min    Activity Tolerance  Patient tolerated treatment well       Past Medical History:  Diagnosis Date  . COPD (chronic obstructive pulmonary disease) ([place])   . Emphysema lung ([place])   . Hx of long term use of blood thinners   . Hypertension   . Stroke [facility]) 2013  . Thyroid disease    hypothyroidism    Past Surgical History:  Procedure Laterality Date  . IR ANGIO EXTRACRAN SEL COM CAROTID INNOMINATE UNI R MOD SED  06/19/2017  . IR ANGIO VERTEBRAL SEL SUBCLAVIAN INNOMINATE UNI R MOD SED  06/19/2017  . IR GASTROSTOMY TUBE MOD SED  07/04/2017  . RADIOLOGY WITH ANESTHESIA N/A 06/19/2017   Procedure: RADIOLOGY WITH ANESTHESIA;  Surgeon: Luanne Bras, MD;  Location: [place];  Service: Radiology;  Laterality: N/A;    There were no vitals filed for this visit.  Subjective Assessment - 11/14/17 1036    Subjective  able to explain himself in conversation and his intelligibility improved when he spoke in a louder voice. Dr. Letta Pate will be able to remove his PEG tube in 30 days if he is able to increase his water intake by mouth.    Currently in Pain?  No/denies    Pain Score  0-No pain    Multiple Pain Sites  No            ADULT SLP TREATMENT - 11/14/17 0001      General Information   Behavior/Cognition  Alert;Cooperative;Pleasant mood;Distractible    HPI  77 year old man, with history of prior stroke with speech and language deficits, with  right hemisphere CVA 06/19/2017 with profound dysarthria and severe oropharyngeal dysphagia.  The patient received inpatient rehab services 06/23/2017 - 07/04/2017 and home health services on discharge.         Treatment Provided   Treatment provided  Dysphagia;Cognitive-Linquistic      Pain Assessment   Pain Assessment  No/denies pain      Cognitive-Linquistic Treatment   Treatment focused on  Dysarthria;Apraxia;Voice;Other (comment);Patient/family/caregiver education Dysphagia    Skilled Treatment  SWALLOWING:   Patient left log at home.  Today, the patient took appx 3 ounces of thin liquid, water, by sipping throughout the session. He coughed in  12/17 trials and cleared his throat in 2. He improved when reminded to take little sips and swallow twice. He would often talk before a second swallow had helped clear the liquid and would cough. PHONATION:  Patient able to imitate words/phrases, maintaining loudness for all words with 95% accuracy. In conversation, he was able to maintain loudness and express himself using voice and gestures. He sometimes deletes syllables which makes him difficult to understand. He improves with prompting and visual cues from the therapist.  His intelligibility is improving and he is able to maintain loudness.  Parietal lobe infarction ([place]) 06/23/2017  . Dysphagia, oropharyngeal 06/23/2017  . Respiratory failure ([place])   . Palliative care encounter   . Aspiration pneumonia due to gastric secretions ([place])   . Atrial fibrillation ([place])   . Stroke (cerebrum) ([place]) 06/19/2017    [name] 11/14/2017, 10:39 AM  [place] MAIN [facility] SERVICES [address] [place], [place], [address] Phone: [phone]   Fax:  [phone]   Name: Ronald [name] MRN: [id number] Date of Birth: October 24, 1940  Parietal lobe infarction ([place]) 06/23/2017  . Dysphagia, oropharyngeal 06/23/2017  . Respiratory failure ([place])   . Palliative care encounter   . Aspiration pneumonia due to gastric secretions ([place])   . Atrial fibrillation ([place])   . Stroke (cerebrum) ([place]) 06/19/2017    [name] 11/14/2017, 10:39 AM  [place] MAIN [facility] SERVICES [address] [place], [place], [address] Phone: [phone]   Fax:  [phone]   Name: Ronald [name] MRN: [id number] Date of Birth: October 24, 1940

## 2017-11-15 ENCOUNTER — Ambulatory Visit: Payer: Medicare Other | Admitting: Occupational Therapy

## 2017-11-15 ENCOUNTER — Ambulatory Visit: Payer: Self-pay | Admitting: Physical Therapy

## 2017-11-15 ENCOUNTER — Ambulatory Visit: Payer: Medicare Other | Admitting: Speech Pathology

## 2017-11-15 ENCOUNTER — Encounter: Payer: Self-pay | Admitting: Occupational Therapy

## 2017-11-15 DIAGNOSIS — R471 Dysarthria and anarthria: Secondary | ICD-10-CM

## 2017-11-15 DIAGNOSIS — R278 Other lack of coordination: Secondary | ICD-10-CM

## 2017-11-15 DIAGNOSIS — M6281 Muscle weakness (generalized): Secondary | ICD-10-CM

## 2017-11-15 DIAGNOSIS — R1312 Dysphagia, oropharyngeal phase: Secondary | ICD-10-CM

## 2017-11-15 NOTE — Therapy (Signed)
[facility] MAIN [facility] SERVICES [address] [place], [place], [address] Phone: [phone]   Fax:  [phone]  Occupational Therapy Treatment  Patient Details  Name: Ronald [name] MRN: [id number] Date of Birth: 03-Sep-1940 Referring Provider: Dr. Wynn Banker   Encounter Date: 11/15/2017  OT End of Session - 11/15/17 1406    Visit Number  16    Number of Visits  24    Date for OT Re-Evaluation  11/28/17    Authorization Type  Visit 6 of 10 for progress report period starting 10/23/2017    OT Start Time  1345    OT Stop Time  1430    OT Time Calculation (min)  45 min    Activity Tolerance  Patient tolerated treatment well    Behavior During Therapy  [facility] for tasks assessed/performed       Past Medical History:  Diagnosis Date  . COPD (chronic obstructive pulmonary disease) (HCC)   . Emphysema lung (HCC)   . Hx of long term use of blood thinners   . Hypertension   . Stroke [facility]) 2013  . Thyroid disease    hypothyroidism    Past Surgical History:  Procedure Laterality Date  . IR ANGIO EXTRACRAN SEL COM CAROTID INNOMINATE UNI R MOD SED  06/19/2017  . IR ANGIO VERTEBRAL SEL SUBCLAVIAN INNOMINATE UNI R MOD SED  06/19/2017  . IR GASTROSTOMY TUBE MOD SED  07/04/2017  . RADIOLOGY WITH ANESTHESIA N/A 06/19/2017   Procedure: RADIOLOGY WITH ANESTHESIA;  Surgeon: Julieanne Cotton, MD;  [facility];  Service: Radiology;  Laterality: N/A;    There were no vitals filed for this visit.  Subjective Assessment - 11/15/17 1406    Subjective   Pt. continues to present with severe communication deficits. Pt. continues to use gestures in reponse to questions.    Patient is accompained by:  Family member    Pertinent History  Pt. is a 77 y.o. male who was admitted to [facility] with a Right MCA Infarct with Lft Hemiparesis, and severe oropharyngeal dysphagia, and severe dysarthria.    Patient Stated Goals  Patient would like to be as independent as  possible.     Currently in Pain?  No/denies      OT TREATMENT    Neuro muscular re-education:  Pt. worked on [facility] skills grasping, flipping and stacking 2" large pegs on the Instructo board placed at a tabletop surface. Pt. worked on sorting [employer] of pegs on the [employer]. Pt. had difficulty with sorting the pegs by color, and required cues initially. Pt. Worked on [facility] grasping 1/4" pegs, and sorting colors using a small pegboard. Pt. Presented with less difficulty with sorting the colors on the small pegboard.   Therapeutic Exercise:  Pt. Worked on the [employer] for 8 min. With constant monitoring of the BUEs. Pt. Worked on changing, and alternating forward reverse position every 2 min. Rest breaks were required.                         OT Education - 11/15/17 1406    Education provided  Yes    Education Details  fine motor coordination skills    Person(s) Educated  Patient    Methods  Explanation;Demonstration    Comprehension  Verbalized understanding;Returned demonstration;Verbal cues required          OT Long Term Goals - 10/23/17 1342      OT LONG TERM  Fax:  [phone]  Name: Marinus MawOtis L Gibbard MRN: [id number] Date of Birth: 1940/06/01  [facility] MAIN [facility] SERVICES [address] [place], [place], [address] Phone: [phone]   Fax:  [phone]  Occupational Therapy Treatment  Patient Details  Name: Ronald [name] MRN: [id number] Date of Birth: 03-Sep-1940 Referring Provider: Dr. Wynn Banker   Encounter Date: 11/15/2017  OT End of Session - 11/15/17 1406    Visit Number  16    Number of Visits  24    Date for OT Re-Evaluation  11/28/17    Authorization Type  Visit 6 of 10 for progress report period starting 10/23/2017    OT Start Time  1345    OT Stop Time  1430    OT Time Calculation (min)  45 min    Activity Tolerance  Patient tolerated treatment well    Behavior During Therapy  [facility] for tasks assessed/performed       Past Medical History:  Diagnosis Date  . COPD (chronic obstructive pulmonary disease) (HCC)   . Emphysema lung (HCC)   . Hx of long term use of blood thinners   . Hypertension   . Stroke [facility]) 2013  . Thyroid disease    hypothyroidism    Past Surgical History:  Procedure Laterality Date  . IR ANGIO EXTRACRAN SEL COM CAROTID INNOMINATE UNI R MOD SED  06/19/2017  . IR ANGIO VERTEBRAL SEL SUBCLAVIAN INNOMINATE UNI R MOD SED  06/19/2017  . IR GASTROSTOMY TUBE MOD SED  07/04/2017  . RADIOLOGY WITH ANESTHESIA N/A 06/19/2017   Procedure: RADIOLOGY WITH ANESTHESIA;  Surgeon: Julieanne Cotton, MD;  [facility];  Service: Radiology;  Laterality: N/A;    There were no vitals filed for this visit.  Subjective Assessment - 11/15/17 1406    Subjective   Pt. continues to present with severe communication deficits. Pt. continues to use gestures in reponse to questions.    Patient is accompained by:  Family member    Pertinent History  Pt. is a 77 y.o. male who was admitted to [facility] with a Right MCA Infarct with Lft Hemiparesis, and severe oropharyngeal dysphagia, and severe dysarthria.    Patient Stated Goals  Patient would like to be as independent as  possible.     Currently in Pain?  No/denies      OT TREATMENT    Neuro muscular re-education:  Pt. worked on [facility] skills grasping, flipping and stacking 2" large pegs on the Instructo board placed at a tabletop surface. Pt. worked on sorting [employer] of pegs on the [employer] [facility]. Pt. had difficulty with sorting the pegs by color, and required cues initially. Pt. Worked on [facility] grasping 1/4" pegs, and sorting colors using a small pegboard. Pt. Presented with less difficulty with sorting the colors on the small pegboard.   Therapeutic Exercise:  Pt. Worked on the [employer] for 8 min. With constant monitoring of the BUEs. Pt. Worked on changing, and alternating forward reverse position every 2 min. Rest breaks were required.                         OT Education - 11/15/17 1406    Education provided  Yes    Education Details  fine motor coordination skills    Person(s) Educated  Patient    Methods  Explanation;Demonstration    Comprehension  Verbalized understanding;Returned demonstration;Verbal cues required          OT Long Term Goals - 10/23/17 1342      OT LONG TERM

## 2017-11-16 ENCOUNTER — Encounter: Payer: Self-pay | Admitting: Speech Pathology

## 2017-11-16 NOTE — Therapy (Signed)
Name: NUCHEM GRATTAN MRN: [id number] Date of Birth: 06/28/1940  [place] MAIN [facility] SERVICES [address] [place], [place], [address] Phone: [phone]   Fax:  [phone]  Speech Language Pathology Treatment/Progress Note  Patient Details  Name: Ronald [name] MRN: [id number] Date of Birth: February 24, 1941 Referring Provider: Charlett Blake   Encounter Date: 11/15/2017  End of Session - 11/16/17 0831    Visit Number  23    Number of Visits  33    Date for SLP Re-Evaluation  12/16/17    SLP Start Time  1430    SLP Stop Time   1520    SLP Time Calculation (min)  50 min    Activity Tolerance  Patient tolerated treatment well       Past Medical History:  Diagnosis Date  . COPD (chronic obstructive pulmonary disease) ([place])   . Emphysema lung ([place])   . Hx of long term use of blood thinners   . Hypertension   . Stroke [facility]) 2013  . Thyroid disease    hypothyroidism    Past Surgical History:  Procedure Laterality Date  . IR ANGIO EXTRACRAN SEL COM CAROTID INNOMINATE UNI R MOD SED  06/19/2017  . IR ANGIO VERTEBRAL SEL SUBCLAVIAN INNOMINATE UNI R MOD SED  06/19/2017  . IR GASTROSTOMY TUBE MOD SED  07/04/2017  . RADIOLOGY WITH ANESTHESIA N/A 06/19/2017   Procedure: RADIOLOGY WITH ANESTHESIA;  Surgeon: Luanne Bras, MD;  Location: [place];  Service: Radiology;  Laterality: N/A;    There were no vitals filed for this visit.  Subjective Assessment - 11/16/17 0830    Subjective  Engaged and motivated to drink water. Pt. log noted that he's been coughing more than normal when eating.      Currently in Pain?  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  The  patient received inpatient rehab services 06/23/2017 - 07/04/2017 and home health services on discharge.         Treatment Provided   Treatment provided  Dysphagia;Cognitive-Linquistic      Pain Assessment   Pain Assessment  No/denies pain      Cognitive-Linquistic Treatment   Treatment focused on  Dysarthria;Apraxia;Voice;Other (comment);Patient/family/caregiver education Dysphagia    Skilled Treatment  SWALLOWING:   Patient returned with log.  Today, the patient took appx 2 ounces of thin liquid, water, by sipping throughout the session. He coughed in 5/6 trials.  He improved when reminded to take little sips and swallow twice. 9x 10Ml where he coughed in 6/9 trials. PHONATION:  Patient able to imitate words/phrases, maintaining loudness for all words with 95% accuracy. In conversation, he was able to maintain loudness and express himself using voice and gestures. He sometimes deletes syllables which makes him difficult to understand. He improves with prompting and visual cues from the therapist.  His intelligibility is improving and he is able to maintain loudness.      Assessment / Recommendations / Plan   Plan  Continue with current plan of care      Progression Toward Goals   Progression toward goals  Progressing toward goals  [place] MAIN [facility] SERVICES [address] [place], [place], [address] Phone: [phone]   Fax:  [phone]  Speech Language Pathology Treatment/Progress Note  Patient Details  Name: Ronald [name] MRN: [id number] Date of Birth: February 24, 1941 Referring Provider: Charlett Blake   Encounter Date: 11/15/2017  End of Session - 11/16/17 0831    Visit Number  23    Number of Visits  33    Date for SLP Re-Evaluation  12/16/17    SLP Start Time  1430    SLP Stop Time   1520    SLP Time Calculation (min)  50 min    Activity Tolerance  Patient tolerated treatment well       Past Medical History:  Diagnosis Date  . COPD (chronic obstructive pulmonary disease) ([place])   . Emphysema lung ([place])   . Hx of long term use of blood thinners   . Hypertension   . Stroke [facility]) 2013  . Thyroid disease    hypothyroidism    Past Surgical History:  Procedure Laterality Date  . IR ANGIO EXTRACRAN SEL COM CAROTID INNOMINATE UNI R MOD SED  06/19/2017  . IR ANGIO VERTEBRAL SEL SUBCLAVIAN INNOMINATE UNI R MOD SED  06/19/2017  . IR GASTROSTOMY TUBE MOD SED  07/04/2017  . RADIOLOGY WITH ANESTHESIA N/A 06/19/2017   Procedure: RADIOLOGY WITH ANESTHESIA;  Surgeon: Luanne Bras, MD;  Location: [place];  Service: Radiology;  Laterality: N/A;    There were no vitals filed for this visit.  Subjective Assessment - 11/16/17 0830    Subjective  Engaged and motivated to drink water. Pt. log noted that he's been coughing more than normal when eating.      Currently in Pain?  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  The  patient received inpatient rehab services 06/23/2017 - 07/04/2017 and home health services on discharge.         Treatment Provided   Treatment provided  Dysphagia;Cognitive-Linquistic      Pain Assessment   Pain Assessment  No/denies pain      Cognitive-Linquistic Treatment   Treatment focused on  Dysarthria;Apraxia;Voice;Other (comment);Patient/family/caregiver education Dysphagia    Skilled Treatment  SWALLOWING:   Patient returned with log.  Today, the patient took appx 2 ounces of thin liquid, water, by sipping throughout the session. He coughed in 5/6 trials.  He improved when reminded to take little sips and swallow twice. 9x 10Ml where he coughed in 6/9 trials. PHONATION:  Patient able to imitate words/phrases, maintaining loudness for all words with 95% accuracy. In conversation, he was able to maintain loudness and express himself using voice and gestures. He sometimes deletes syllables which makes him difficult to understand. He improves with prompting and visual cues from the therapist.  His intelligibility is improving and he is able to maintain loudness.      Assessment / Recommendations / Plan   Plan  Continue with current plan of care      Progression Toward Goals   Progression toward goals  Progressing toward goals

## 2017-11-20 ENCOUNTER — Ambulatory Visit: Payer: Self-pay | Admitting: Physical Therapy

## 2017-11-20 ENCOUNTER — Ambulatory Visit: Payer: Medicare Other | Admitting: Occupational Therapy

## 2017-11-20 ENCOUNTER — Ambulatory Visit: Payer: Medicare Other | Admitting: Speech Pathology

## 2017-11-20 ENCOUNTER — Other Ambulatory Visit: Payer: Self-pay

## 2017-11-20 ENCOUNTER — Encounter: Payer: Self-pay | Admitting: Occupational Therapy

## 2017-11-20 DIAGNOSIS — R471 Dysarthria and anarthria: Secondary | ICD-10-CM

## 2017-11-20 DIAGNOSIS — R278 Other lack of coordination: Secondary | ICD-10-CM | POA: Diagnosis not present

## 2017-11-20 DIAGNOSIS — M6281 Muscle weakness (generalized): Secondary | ICD-10-CM | POA: Diagnosis not present

## 2017-11-20 DIAGNOSIS — R1312 Dysphagia, oropharyngeal phase: Secondary | ICD-10-CM | POA: Diagnosis not present

## 2017-11-20 NOTE — Therapy (Signed)
[facility] MAIN [facility] SERVICES [address] [place], [place], [address] Phone: [phone]   Fax:  [phone]  Occupational Therapy Treatment  Patient Details  Name: Ronald [name] MRN: [id number] Date of Birth: August 17, 1940 Referring Provider: Dr. Wynn Banker   Encounter Date: 11/20/2017  OT End of Session - 11/20/17 1447    Visit Number  17    Number of Visits  24    Date for OT Re-Evaluation  11/28/17    Authorization Type  Visit 7 of 10 for progress report period starting 10/23/2017    OT Start Time  1435    OT Stop Time  1515    OT Time Calculation (min)  40 min    Activity Tolerance  Patient tolerated treatment well    Behavior During Therapy  [facility] for tasks assessed/performed       Past Medical History:  Diagnosis Date  . COPD (chronic obstructive pulmonary disease) (HCC)   . Emphysema lung (HCC)   . Hx of long term use of blood thinners   . Hypertension   . Stroke [facility]) 2013  . Thyroid disease    hypothyroidism    Past Surgical History:  Procedure Laterality Date  . IR ANGIO EXTRACRAN SEL COM CAROTID INNOMINATE UNI R MOD SED  06/19/2017  . IR ANGIO VERTEBRAL SEL SUBCLAVIAN INNOMINATE UNI R MOD SED  06/19/2017  . IR GASTROSTOMY TUBE MOD SED  07/04/2017  . RADIOLOGY WITH ANESTHESIA N/A 06/19/2017   Procedure: RADIOLOGY WITH ANESTHESIA;  Surgeon: Julieanne Cotton, MD;  [facility];  Service: Radiology;  Laterality: N/A;    There were no vitals filed for this visit.  Subjective Assessment - 11/20/17 1444    Subjective   Pt. is is making progress overall.    Patient is accompained by:  Family member    Pertinent History  Pt. is a 77 y.o. male who was admitted to [facility] with a Right MCA Infarct with Lft Hemiparesis, and severe oropharyngeal dysphagia, and severe dysarthria.    Patient Stated Goals  Patient would like to be as independent as possible.     Currently in Pain?  No/denies      OT TREATMENT    Neuro muscular  re-education:  Pt. worked on grasping coins from a tabletop surface, placing them into a resistive container, and pushing them through the slot while isolating his 2nd digit. Pt. worked on [facility] tasks, grasping, and manipulating small 1/4" pegs, and placing them in a small pegboard. Pt. Worked on storing the pegs in the palm of his to remove them from the pegboard.  Therapeutic Exercise:  Pt. performed 2# dowel ex. For UE strengthening secondary to weakness. Bilateral shoulder flexion, chest press, circular patterns, and elbow flexion/extension were performed.                         OT Education - 11/20/17 1446    Education provided  Yes    Education Details  fine motor coordination skills    Person(s) Educated  Patient    Methods  Explanation;Demonstration    Comprehension  Verbalized understanding;Returned demonstration;Verbal cues required          OT Long Term Goals - 10/23/17 1342      OT LONG TERM GOAL #1   Title  Pt. will demonstrate independence with visual compensatory strategies during ADL, and IADL tasks.    Baseline  10/18/2017: Visual impairment present. Vision to be  further assessed to determine how it may be affecting ADL/IADL functioning.    Time  12    Period  Weeks    Status  On-going    Target Date  11/28/17      OT LONG TERM GOAL #2   Title  Pt. will increase Left grip strength will increase by 10# to improve ADL/IADL functioning.    Baseline  10/18/2017: Limited LUE grip strength    Time  12    Period  Weeks    Status  On-going    Target Date  11/28/17      OT LONG TERM GOAL #3   Title  Pt. will improve [facility] skills by 5 sec. of speed to be able to manipulate ADL/IADL objects.    Baseline  10/18/2017: Limited left hand coordination skills.    Time  12    Period  Weeks    Status  On-going    Target Date  11/28/17      OT LONG TERM GOAL #4   Title  Pt. will accurately identify 10/10 home safety hazards for ADLs, and IADLs    Baseline   10/18/2017: Limited.    Time  12    Period  Weeks    Status  On-going    Target Date  11/28/17            Plan - 11/20/17 1448    Clinical Impression Statement  Pt. presents with limited UE strength, and [facility] skills. Pt. continues to require verbal cues, tactile cues, and visual demonstration for motor planning through movement patterns. Pt. continues to work on improving [facility] skills for improved ADLs, and IADLs.    Occupational Profile and client history currently impacting functional performance  Pt. resides with his daughter.    Occupational performance deficits (Please refer to evaluation for details):  ADL's;IADL's    Rehab Potential  Poor    Current Impairments/barriers affecting progress:  Positive Indicators: age, family support, Negative Indicators: multiple comorbidities    OT Frequency  2x / week    OT Duration  12 weeks    OT Treatment/Interventions  Self-care/ADL training;Therapeutic exercise;Therapeutic activities;Neuromuscular education;DME and/or AE instruction;Patient/family education;Energy conservation    Clinical Decision Making  Several treatment options, min-mod task modification necessary    Consulted and Agree with Plan of Care  Patient       Patient will benefit from skilled therapeutic intervention in order to improve the following deficits and impairments:  Impaired UE functional use, Decreased activity tolerance, Decreased endurance, Decreased coordination, Impaired vision/preception, Decreased cognition, Decreased strength  Visit Diagnosis: Muscle weakness (generalized)  Other lack of coordination    Problem List Patient Active Problem List   Diagnosis Date Noted  . PEG (percutaneous endoscopic gastrostomy) status (HCC)   . Benign essential HTN   . Hypertensive crisis   . Dysarthria, post-stroke   . Acute pain of right shoulder   . 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 Dysphagia, oropharyngeal 06/23/2017  . Respiratory failure (HCC)   .  Palliative care encounter   . Aspiration pneumonia due to gastric secretions (HCC)   . Atrial fibrillation (HCC)   . 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    Olegario MessierElaine Nidia Grogan , MS, OTR/L 11/20/2017, 2:58 PM  [place] [facility] [address] [place], [place], [address] Phone: [phone]   Fax:  [phone]  Name: Ronald [name] MRN: [id number] Date of Birth: [birth year]

## 2017-11-21 ENCOUNTER — Encounter: Payer: Self-pay | Admitting: Speech Pathology

## 2017-11-21 NOTE — Therapy (Signed)
[id number] Date of Birth: 1940-08-26  [id number] Date of Birth: 1940-08-26  [place] MAIN [facility] SERVICES [address] [place], [place], [address] Phone: [phone]   Fax:  [phone]  Speech Language Pathology Treatment  Patient Details  Name: Ronald [name] MRN: [id number] Date of Birth: Jul 31, 1940 Referring Provider: Charlett Blake   Encounter Date: 11/20/2017  End of Session - 11/21/17 0844    Visit Number  24    Number of Visits  33    Date for SLP Re-Evaluation  12/16/17    SLP Start Time  26    SLP Stop Time   1555    SLP Time Calculation (min)  50 min    Activity Tolerance  Patient tolerated treatment well       Past Medical History:  Diagnosis Date  . COPD (chronic obstructive pulmonary disease) ([place])   . Emphysema lung ([place])   . Hx of long term use of blood thinners   . Hypertension   . Stroke [facility]) 2013  . Thyroid disease    hypothyroidism    Past Surgical History:  Procedure Laterality Date  . IR ANGIO EXTRACRAN SEL COM CAROTID INNOMINATE UNI R MOD SED  06/19/2017  . IR ANGIO VERTEBRAL SEL SUBCLAVIAN INNOMINATE UNI R MOD SED  06/19/2017  . IR GASTROSTOMY TUBE MOD SED  07/04/2017  . RADIOLOGY WITH ANESTHESIA N/A 06/19/2017   Procedure: RADIOLOGY WITH ANESTHESIA;  Surgeon: Luanne Bras, MD;  Location: [place];  Service: Radiology;  Laterality: N/A;    There were no vitals filed for this visit.  Subjective Assessment - 11/21/17 0843    Subjective  Engaged and motivated to drink water. Pt. log noted "no changes"    Currently in Pain?  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  The patient received inpatient rehab services 06/23/2017 -  07/04/2017 and home health services on discharge.         Treatment Provided   Treatment provided  Dysphagia;Cognitive-Linquistic      Pain Assessment   Pain Assessment  No/denies pain      Cognitive-Linquistic Treatment   Treatment focused on  Dysarthria;Apraxia;Voice;Other (comment);Patient/family/caregiver education Dysphagia    Skilled Treatment   SWALLOWING:   Patient returned with log.  Today, the patient took 4 oz of nectar thick liquids by sipping at his own pace. He coughed in 6/8 sips. He drank thin liquid, water, 58m coughing 1/7 trials and 146mcoughing 5/12 trials. PHONATION:  Patient able to imitate words/phrases, maintaining loudness for all words with 95% accuracy. In conversation, he was able to maintain loudness and express himself using voice and gestures. He sometimes deletes syllables which makes him difficult to understand. He improves with prompting and visual cues from the therapist.  His intelligibility is improving and he is able to maintain loudness.      Assessment / Recommendations / Plan   Plan  Continue with current plan of care      Progression Toward Goals   Progression toward goals  Progressing toward goals       SLP Education - 11/21/17 0843    Education provided  Yes

## 2017-11-22 ENCOUNTER — Ambulatory Visit: Payer: Medicare Other | Admitting: Occupational Therapy

## 2017-11-22 ENCOUNTER — Ambulatory Visit: Payer: Medicare Other | Admitting: Speech Pathology

## 2017-11-22 ENCOUNTER — Encounter: Payer: Self-pay | Admitting: Occupational Therapy

## 2017-11-22 DIAGNOSIS — M6281 Muscle weakness (generalized): Secondary | ICD-10-CM | POA: Diagnosis not present

## 2017-11-22 DIAGNOSIS — R278 Other lack of coordination: Secondary | ICD-10-CM

## 2017-11-22 DIAGNOSIS — R1312 Dysphagia, oropharyngeal phase: Secondary | ICD-10-CM

## 2017-11-22 DIAGNOSIS — R471 Dysarthria and anarthria: Secondary | ICD-10-CM | POA: Diagnosis not present

## 2017-11-22 NOTE — Therapy (Signed)
[place] [facility] MAIN [facility] SERVICES [address] [place], [place], [address] Phone: [phone]   Fax:  [phone]  Occupational Therapy Treatment/Recertification/Progress Report Note  Patient Details  Name: Ronald [name] MRN: [id number] Date of Birth: 03-06-1941 Referring Provider: Dr. Wynn Banker   Encounter Date: 11/22/2017  OT End of Session - 11/22/17 1615    Visit Number  18    Number of Visits  24    Date for OT Re-Evaluation  02/14/2018   Authorization Type  Visit 1 of 10 for progress report period starting 11/22/2017    OT Start Time  1600    OT Stop Time  1645    OT Time Calculation (min)  45 min    Activity Tolerance  Patient tolerated treatment well    Behavior During Therapy  [facility] for tasks assessed/performed       Past Medical History:  Diagnosis Date  . COPD (chronic obstructive pulmonary disease) (HCC)   . Emphysema lung (HCC)   . Hx of long term use of blood thinners   . Hypertension   . Stroke [facility]) 2013  . Thyroid disease    hypothyroidism    Past Surgical History:  Procedure Laterality Date  . IR ANGIO EXTRACRAN SEL COM CAROTID INNOMINATE UNI R MOD SED  06/19/2017  . IR ANGIO VERTEBRAL SEL SUBCLAVIAN INNOMINATE UNI R MOD SED  06/19/2017  . IR GASTROSTOMY TUBE MOD SED  07/04/2017  . RADIOLOGY WITH ANESTHESIA N/A 06/19/2017   Procedure: RADIOLOGY WITH ANESTHESIA;  Surgeon: Julieanne Cotton, MD;  [facility];  Service: Radiology;  Laterality: N/A;    There were no vitals filed for this visit.  Subjective Assessment - 11/22/17 1612    Subjective   Pt. is is making progress overall.    Patient is accompained by:  Family member    Pertinent History  Pt. is a 78 y.o. male who was admitted to [facility] with a Right MCA Infarct with Lft Hemiparesis, and severe oropharyngeal dysphagia, and severe dysarthria.    Patient Stated Goals  Patient would like to be as independent as possible.     Currently in Pain?  No/denies         [facility] OT Assessment - 11/22/17 1640      Coordination   Right 9 Hole Peg Test  30 sec.    Left 9 Hole Peg Test  55 sec.       Hand Function   Right Hand Grip (lbs)  80#    Right Hand Lateral Pinch  19 lbs    Left Hand Grip (lbs)  55#    Left Hand Lateral Pinch  12 lbs      OT TREATMENT    Measurements were obtained, and goals were reviewed with the patient.   Neuro muscular re-education:  Pt. performed [facility] skills training to improve speed and dexterity needed for ADL tasks and writing. Pt. demonstrated grasping 1 inch sticks,  inch cylindrical collars, and  inch flat washers on the Purdue pegboard. Pt. performed grasping each item with his 2nd digit and thumb, and storing them in the palm.   Therapeutic Exercise:  Pt. Worked on the [employer] for 5 min. With constant monitoring of the BUEs. Pt. Worked on changing, and alternating forward reverse position every 2 min. Rest breaks were required. Pt. Worked on level 4.5. Pt. performed 2.5# dowel ex. For UE strengthening secondary to weakness. Bilateral shoulder flexion, chest press, circular patterns, and elbow flexion/extension were  Problem List Patient Active Problem List   Diagnosis Date Noted  . PEG (percutaneous endoscopic gastrostomy) status (HCC)   . Benign essential HTN   . Hypertensive crisis   . Dysarthria, post-stroke   . Acute pain of right shoulder   . 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 Dysphagia, oropharyngeal 06/23/2017  . Respiratory failure (HCC)   . Palliative care encounter   . Aspiration pneumonia due to gastric secretions (HCC)   . Atrial fibrillation (HCC)   . 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    Olegario Messier, MS, OTR/L 11/22/2017, 5:16 PM  [facility] MAIN [facility] SERVICES [address] [place], [place], [address] Phone: [phone]   Fax:  [phone]  Name: REICHEN HUTZLER MRN: [id number] Date of Birth: [birth year]  [place] [facility] MAIN [facility] SERVICES [address] [place], [place], [address] Phone: [phone]   Fax:  [phone]  Occupational Therapy Treatment/Recertification/Progress Report Note  Patient Details  Name: Ronald [name] MRN: [id number] Date of Birth: 03-06-1941 Referring Provider: Dr. Wynn Banker   Encounter Date: 11/22/2017  OT End of Session - 11/22/17 1615    Visit Number  18    Number of Visits  24    Date for OT Re-Evaluation  02/14/2018   Authorization Type  Visit 1 of 10 for progress report period starting 11/22/2017    OT Start Time  1600    OT Stop Time  1645    OT Time Calculation (min)  45 min    Activity Tolerance  Patient tolerated treatment well    Behavior During Therapy  [facility] for tasks assessed/performed       Past Medical History:  Diagnosis Date  . COPD (chronic obstructive pulmonary disease) (HCC)   . Emphysema lung (HCC)   . Hx of long term use of blood thinners   . Hypertension   . Stroke [facility]) 2013  . Thyroid disease    hypothyroidism    Past Surgical History:  Procedure Laterality Date  . IR ANGIO EXTRACRAN SEL COM CAROTID INNOMINATE UNI R MOD SED  06/19/2017  . IR ANGIO VERTEBRAL SEL SUBCLAVIAN INNOMINATE UNI R MOD SED  06/19/2017  . IR GASTROSTOMY TUBE MOD SED  07/04/2017  . RADIOLOGY WITH ANESTHESIA N/A 06/19/2017   Procedure: RADIOLOGY WITH ANESTHESIA;  Surgeon: Julieanne Cotton, MD;  [facility];  Service: Radiology;  Laterality: N/A;    There were no vitals filed for this visit.  Subjective Assessment - 11/22/17 1612    Subjective   Pt. is is making progress overall.    Patient is accompained by:  Family member    Pertinent History  Pt. is a 78 y.o. male who was admitted to [facility] with a Right MCA Infarct with Lft Hemiparesis, and severe oropharyngeal dysphagia, and severe dysarthria.    Patient Stated Goals  Patient would like to be as independent as possible.     Currently in Pain?  No/denies         [facility] OT Assessment - 11/22/17 1640      Coordination   Right 9 Hole Peg Test  30 sec.    Left 9 Hole Peg Test  55 sec.       Hand Function   Right Hand Grip (lbs)  80#    Right Hand Lateral Pinch  19 lbs    Left Hand Grip (lbs)  55#    Left Hand Lateral Pinch  12 lbs      OT TREATMENT    Measurements were obtained, and goals were reviewed with the patient.   Neuro muscular re-education:  Pt. performed [facility] skills training to improve speed and dexterity needed for ADL tasks and writing. Pt. demonstrated grasping 1 inch sticks,  inch cylindrical collars, and  inch flat washers on the Purdue pegboard. Pt. performed grasping each item with his 2nd digit and thumb, and storing them in the palm.   Therapeutic Exercise:  Pt. Worked on the [employer] for 5 min. With constant monitoring of the BUEs. Pt. Worked on changing, and alternating forward reverse position every 2 min. Rest breaks were required. Pt. Worked on level 4.5. Pt. performed 2.5# dowel ex. For UE strengthening secondary to weakness. Bilateral shoulder flexion, chest press, circular patterns, and elbow flexion/extension were

## 2017-11-22 NOTE — Addendum Note (Signed)
Addended by: Avon GullyJAGENTENFL, Jayin Derousse M on: 11/22/2017 05:24 PM   Modules accepted: Orders

## 2017-11-23 ENCOUNTER — Encounter: Payer: Self-pay | Admitting: Occupational Therapy

## 2017-11-23 ENCOUNTER — Encounter: Payer: Self-pay | Admitting: Speech Pathology

## 2017-11-23 ENCOUNTER — Ambulatory Visit: Payer: Self-pay | Admitting: Physical Therapy

## 2017-11-23 NOTE — Therapy (Signed)
[place] [facility] MAIN [facility] SERVICES [address] [place], [place], [address] Phone: [phone]   Fax:  [phone]  Speech Language Pathology Treatment  Patient Details  Name: Ronald [name] MRN: [id number] Date of Birth: Jul 25, 1940 Referring Provider: [name]   Encounter Date: 11/22/2017  End of Session - 11/23/17 0939    Visit Number  25    Number of Visits  33    Date for SLP Re-Evaluation  12/16/17    SLP Start Time  1500    SLP Stop Time   1550    SLP Time Calculation (min)  50 min    Activity Tolerance  Patient tolerated treatment well       Past Medical History:  Diagnosis Date  . COPD (chronic obstructive pulmonary disease) (HCC)   . Emphysema lung (HCC)   . Hx of long term use of blood thinners   . Hypertension   . Stroke [facility]) 2013  . Thyroid disease    hypothyroidism    Past Surgical History:  Procedure Laterality Date  . IR ANGIO EXTRACRAN SEL COM CAROTID INNOMINATE UNI R MOD SED  06/19/2017  . IR ANGIO VERTEBRAL SEL SUBCLAVIAN INNOMINATE UNI R MOD SED  06/19/2017  . IR GASTROSTOMY TUBE MOD SED  07/04/2017  . RADIOLOGY WITH ANESTHESIA N/A 06/19/2017   Procedure: RADIOLOGY WITH ANESTHESIA;  Surgeon: Julieanne Cotton, MD;  [facility];  Service: Radiology;  Laterality: N/A;    There were no vitals filed for this visit.  Subjective Assessment - 11/23/17 0939    Subjective  Engaged and motivated to drink water. He often points to the "swallow twice" symbol but is not consistently swallowing twice without the reminder from the therapist.     Currently in Pain?  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  The patient received inpatient rehab services 06/23/2017 - 07/04/2017 and home health services on discharge.         Treatment Provided   Treatment provided  Dysphagia;Cognitive-Linquistic      Pain Assessment   Pain Assessment  No/denies pain      Cognitive-Linquistic Treatment   Treatment focused on  Dysarthria;Apraxia;Voice;Other (comment);Patient/family/caregiver education Dysphagia    Skilled Treatment  SWALLOWING:   Patient returned with log.  Today, the patient drank thin liquid, water, 10mL coughing 3/8 trials and 15mL coughing 2/5 trials. When sipping on his own, he coughed in 2/5 trials but one of the coughs was because he tried to talk before he was done swallowing.  PHONATION:  Patient able to imitate words/phrases, maintaining loudness for all words with 95% accuracy. In conversation, he was able to maintain loudness and express himself using voice and gestures. He sometimes deletes syllables which makes him difficult to understand. He improves with prompting and visual cues from the therapist.  His intelligibility is improving and he is able to maintain loudness.      Assessment / Recommendations / Plan   Plan  Continue with current plan of care      Progression Toward Goals   Progression toward  goals  Progressing toward goals       SLP Education - 11/23/17 0939    Education provided  Yes    Education Details  Therapist reminded him to wait until he's done swallowing to talk and to swallow twice and to take small sips using a visual that was sent home with him.     Person(s) Educated  Patient    Methods  Explanation;Demonstration    Comprehension  Verbalized understanding;Returned demonstration         SLP Long Term Goals - 11/16/17 1610      SLP LONG TERM GOAL #1   Title  Pt will improve speech intelligibility for words and phrases by controlling rate of speech, over-articulation, and increased loudness to achieve 80%  intelligibility with min. SLP cues.    Time  8    Period  Weeks    Status  Partially Met    Target Date  12/16/17      SLP LONG TERM GOAL #2   Title  Patient will execute Swallowing and Voice Building HEP 5-7 days per week, with assistance from family as needed.    Time  8    Period  Weeks    Status  On-going    Target Date  12/16/17      SLP LONG TERM GOAL #4   Title  Patient will consume 4 oz thin liquid with no more than 1 overt sign/symptom of aspiration.     Status  On-going    Target Date  12/16/17       Plan - 11/23/17 0939    Clinical Impression Statement  Pt. is engaged and making progress. He is improving his swallowing and clearance and continues to speak loudly during phonation activities. We are increasing his thin liquid intake and allowing him to sip on his own as well as take 10 mL/16mL at a time. He needs to increase fluid intake in order to have his PEG removed. His intelligibility improves when he is louder and his motor speech is improving. Will continue to address speech and swallowing via trial POS and high effort/ high intensity vocal exercises.     Speech Therapy Frequency  2x / week    Duration  Other (comment) 8 weeks    Treatment/Interventions  Pharyngeal strengthening exercises;Diet toleration management by SLP;Oral motor exercises;Compensatory strategies;SLP instruction and feedback;Patient/family education    Potential to Achieve Goals  Good    Potential Considerations  Ability to learn/carryover information;Pain level;Family/community support;Co-morbidities;Previous level of function;Cooperation/participation level;Severity of impairments;Medical prognosis    SLP Home Exercise Plan  Respiratory support activities, voice activities, oral intake log, swallowing recommendations, and things to observe for.      Consulted and Agree with Plan of Care  Patient       Patient will benefit from skilled therapeutic intervention in order to improve the following  deficits and impairments:   Oropharyngeal dysphagia    Problem List Patient Active Problem List   Diagnosis Date Noted  . PEG (percutaneous endoscopic gastrostomy) status (HCC)   . Benign essential HTN   . Hypertensive crisis   . Dysarthria, post-stroke   . Acute pain of right shoulder   . 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 Dysphagia, oropharyngeal 06/23/2017  . Respiratory failure (HCC)   . Palliative care encounter   . Aspiration pneumonia due to gastric secretions (HCC)   . Atrial fibrillation (HCC)   . 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

## 2017-11-28 ENCOUNTER — Ambulatory Visit: Payer: Medicare Other | Admitting: Occupational Therapy

## 2017-11-28 ENCOUNTER — Ambulatory Visit: Payer: Self-pay | Admitting: Physical Therapy

## 2017-11-28 ENCOUNTER — Ambulatory Visit: Payer: Medicare Other | Admitting: Speech Pathology

## 2017-11-30 ENCOUNTER — Ambulatory Visit: Payer: Medicare Other | Attending: Physical Medicine & Rehabilitation | Admitting: Speech Pathology

## 2017-11-30 ENCOUNTER — Ambulatory Visit: Payer: Self-pay | Admitting: Physical Therapy

## 2017-11-30 ENCOUNTER — Ambulatory Visit: Payer: Medicare Other | Admitting: Occupational Therapy

## 2017-11-30 DIAGNOSIS — R278 Other lack of coordination: Secondary | ICD-10-CM | POA: Diagnosis not present

## 2017-11-30 DIAGNOSIS — R1312 Dysphagia, oropharyngeal phase: Secondary | ICD-10-CM | POA: Diagnosis not present

## 2017-11-30 DIAGNOSIS — M6281 Muscle weakness (generalized): Secondary | ICD-10-CM | POA: Insufficient documentation

## 2017-11-30 DIAGNOSIS — H543 Unqualified visual loss, both eyes: Secondary | ICD-10-CM | POA: Insufficient documentation

## 2017-11-30 DIAGNOSIS — R471 Dysarthria and anarthria: Secondary | ICD-10-CM | POA: Insufficient documentation

## 2017-11-30 DIAGNOSIS — R41841 Cognitive communication deficit: Secondary | ICD-10-CM | POA: Diagnosis not present

## 2017-12-01 ENCOUNTER — Encounter: Payer: Self-pay | Admitting: Occupational Therapy

## 2017-12-01 ENCOUNTER — Encounter: Payer: Self-pay | Admitting: Speech Pathology

## 2017-12-01 NOTE — Therapy (Signed)
[place] [facility] MAIN [facility] SERVICES [address] [place], [place], [address] Phone: [phone]   Fax:  [phone]  Occupational Therapy Treatment  Patient Details  Name: Ronald [name] MRN: [id number] Date of Birth: 1941/04/29 Referring Provider: Dr. Wynn Banker   Encounter Date: 11/30/2017  OT End of Session - 12/01/17 1045    Visit Number  19    Number of Visits  24    Date for OT Re-Evaluation  02/14/18    Authorization Type  Visit 2 of 10 for progress report period starting 11/22/2017    OT Start Time  1400    OT Stop Time  1446    OT Time Calculation (min)  46 min    Activity Tolerance  Patient tolerated treatment well    Behavior During Therapy  [facility] for tasks assessed/performed       Past Medical History:  Diagnosis Date  . COPD (chronic obstructive pulmonary disease) (HCC)   . Emphysema lung (HCC)   . Hx of long term use of blood thinners   . Hypertension   . Stroke [facility]) 2013  . Thyroid disease    hypothyroidism    Past Surgical History:  Procedure Laterality Date  . IR ANGIO EXTRACRAN SEL COM CAROTID INNOMINATE UNI R MOD SED  06/19/2017  . IR ANGIO VERTEBRAL SEL SUBCLAVIAN INNOMINATE UNI R MOD SED  06/19/2017  . IR GASTROSTOMY TUBE MOD SED  07/04/2017  . RADIOLOGY WITH ANESTHESIA N/A 06/19/2017   Procedure: RADIOLOGY WITH ANESTHESIA;  Surgeon: Julieanne Cotton, MD;  [facility];  Service: Radiology;  Laterality: N/A;    There were no vitals filed for this visit.  Subjective Assessment - 12/01/17 1045    Subjective   Patient reports things are going well, no complaints    Pertinent History  Pt. is a 77 y.o. male who was admitted to [facility] with a Right MCA Infarct with Lft Hemiparesis, and severe oropharyngeal dysphagia, and severe dysarthria.    Patient Stated Goals  Patient would like to be as independent as possible.     Currently in Pain?  No/denies    Pain Score  0-No pain         Patient seen with upper body  ergometer for UE strengthening with resistance of 3.8 to 4.2, forwards, backwards and alternating levels of resistance with therapist in constant attendance to adjust settings and ensure grip.  Strengthening and coordination with resistive Putty to pick out  inch objects and place into grid, occasional cues for prehension patterns.  Moderate resistance Bulletin board and pins to pick up and place into board, cues to push pins all the way into board and remove.  Patient seen for coordination skills with Knotting and unknotting medium nylon rope, moderate resistance knots.   Manipulation with turning and flipping 100 pegboard pieces to opposite ends with cues for prehension, speed and dexterity.                     OT Education - 12/01/17 1045    Education provided  Yes    Education Details  fine motor coordination skills, UE strengthening    Person(s) Educated  Patient    Methods  Explanation;Demonstration    Comprehension  Verbalized understanding;Returned demonstration;Verbal cues required          OT Long Term Goals - 11/22/17 1620      OT LONG TERM GOAL #1   Title  Pt. will demonstrate independence with  Ronald [name] MRN: [id number] Date of Birth: 1940/08/19  [place] [facility] MAIN [facility] SERVICES [address] [place], [place], [address] Phone: [phone]   Fax:  [phone]  Occupational Therapy Treatment  Patient Details  Name: Ronald [name] MRN: [id number] Date of Birth: 1941/04/29 Referring Provider: Dr. Wynn Banker   Encounter Date: 11/30/2017  OT End of Session - 12/01/17 1045    Visit Number  19    Number of Visits  24    Date for OT Re-Evaluation  02/14/18    Authorization Type  Visit 2 of 10 for progress report period starting 11/22/2017    OT Start Time  1400    OT Stop Time  1446    OT Time Calculation (min)  46 min    Activity Tolerance  Patient tolerated treatment well    Behavior During Therapy  [facility] for tasks assessed/performed       Past Medical History:  Diagnosis Date  . COPD (chronic obstructive pulmonary disease) (HCC)   . Emphysema lung (HCC)   . Hx of long term use of blood thinners   . Hypertension   . Stroke [facility]) 2013  . Thyroid disease    hypothyroidism    Past Surgical History:  Procedure Laterality Date  . IR ANGIO EXTRACRAN SEL COM CAROTID INNOMINATE UNI R MOD SED  06/19/2017  . IR ANGIO VERTEBRAL SEL SUBCLAVIAN INNOMINATE UNI R MOD SED  06/19/2017  . IR GASTROSTOMY TUBE MOD SED  07/04/2017  . RADIOLOGY WITH ANESTHESIA N/A 06/19/2017   Procedure: RADIOLOGY WITH ANESTHESIA;  Surgeon: Julieanne Cotton, MD;  [facility];  Service: Radiology;  Laterality: N/A;    There were no vitals filed for this visit.  Subjective Assessment - 12/01/17 1045    Subjective   Patient reports things are going well, no complaints    Pertinent History  Pt. is a 77 y.o. male who was admitted to [facility] with a Right MCA Infarct with Lft Hemiparesis, and severe oropharyngeal dysphagia, and severe dysarthria.    Patient Stated Goals  Patient would like to be as independent as possible.     Currently in Pain?  No/denies    Pain Score  0-No pain         Patient seen with upper body  ergometer for UE strengthening with resistance of 3.8 to 4.2, forwards, backwards and alternating levels of resistance with therapist in constant attendance to adjust settings and ensure grip.  Strengthening and coordination with resistive Putty to pick out  inch objects and place into grid, occasional cues for prehension patterns.  Moderate resistance Bulletin board and pins to pick up and place into board, cues to push pins all the way into board and remove.  Patient seen for coordination skills with Knotting and unknotting medium nylon rope, moderate resistance knots.   Manipulation with turning and flipping 100 pegboard pieces to opposite ends with cues for prehension, speed and dexterity.                     OT Education - 12/01/17 1045    Education provided  Yes    Education Details  fine motor coordination skills, UE strengthening    Person(s) Educated  Patient    Methods  Explanation;Demonstration    Comprehension  Verbalized understanding;Returned demonstration;Verbal cues required          OT Long Term Goals - 11/22/17 1620      OT LONG TERM GOAL #1   Title  Pt. will demonstrate independence with

## 2017-12-01 NOTE — Therapy (Signed)
[place] [facility] MAIN [facility] SERVICES [address] [place], [place], [address] Phone: [phone]   Fax:  [phone]  Speech Language Pathology Treatment  Patient Details  Name: Ronald [name] MRN: [id number] Date of Birth: 07/27/1940 Referring Provider: [name]   Encounter Date: 11/30/2017  End of Session - 12/01/17 1103    Visit Number  26    Number of Visits  33    Date for SLP Re-Evaluation  12/16/17    SLP Start Time  1500    SLP Stop Time   1554    SLP Time Calculation (min)  54 min    Activity Tolerance  Patient tolerated treatment well       Past Medical History:  Diagnosis Date  . COPD (chronic obstructive pulmonary disease) (HCC)   . Emphysema lung (HCC)   . Hx of long term use of blood thinners   . Hypertension   . Stroke [facility]) 2013  . Thyroid disease    hypothyroidism    Past Surgical History:  Procedure Laterality Date  . IR ANGIO EXTRACRAN SEL COM CAROTID INNOMINATE UNI R MOD SED  06/19/2017  . IR ANGIO VERTEBRAL SEL SUBCLAVIAN INNOMINATE UNI R MOD SED  06/19/2017  . IR GASTROSTOMY TUBE MOD SED  07/04/2017  . RADIOLOGY WITH ANESTHESIA N/A 06/19/2017   Procedure: RADIOLOGY WITH ANESTHESIA;  Surgeon: Julieanne Cotton, MD;  Location: [facility] OR;  Service: Radiology;  Laterality: N/A;    There were no vitals filed for this visit.  Subjective Assessment - 12/01/17 1103    Subjective  Engaged and motivated to drink water. He often points to the "swallow twice" symbol but is not consistently swallowing twice without the reminder from the therapist.             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  The patient received inpatient rehab services  06/23/2017 - 07/04/2017 and home health services on discharge.         Treatment Provided   Treatment provided  Dysphagia;Cognitive-Linquistic      Pain Assessment   Pain Assessment  No/denies pain      Cognitive-Linquistic Treatment   Treatment focused on  Dysarthria;Apraxia;Voice;Other (comment);Patient/family/caregiver education Dysphagia    Skilled Treatment  SWALLOWING:   Patient returned with completed log.  Patient has been coughing more recently, not clear why.  The patient drank 8 ounces of water, with cough or throat clear most trials.  Patient consistently able to generate clear, strong vocal quality after cough indicating clearing of residue.  PHONATION: Patient is able to generate a loud h+vowel.   Loudly recite automatic speech series with 100% accuracy.  Patient able to imitate words/phrases, maintaining loudness for all words with 85% accuracy.        Assessment / Recommendations / Plan   Plan  Continue with current plan of care      Progression Toward Goals   Progression toward goals  Progressing toward goals       SLP Education - 12/01/17 1103    Education provided  Yes    Education Details  Cough if you need it-a cough is protective    Person(s) Educated  Patient    Methods  Explanation  Comprehension  Verbalized understanding         SLP Long Term Goals - 11/16/17 1610      SLP LONG TERM GOAL #1   Title  Pt will improve speech intelligibility for words and phrases by controlling rate of speech, over-articulation, and increased loudness to achieve 80% intelligibility with min. SLP cues.    Time  8    Period  Weeks    Status  Partially Met    Target Date  12/16/17      SLP LONG TERM GOAL #2   Title  Patient will execute Swallowing and Voice Building HEP 5-7 days per week, with assistance from family as needed.    Time  8    Period  Weeks    Status  On-going    Target Date  12/16/17      SLP LONG TERM GOAL #4   Title  Patient will consume 4 oz thin  liquid with no more than 1 overt sign/symptom of aspiration.     Status  On-going    Target Date  12/16/17       Plan - 12/01/17 1104    Clinical Impression Statement  The patient demonstrates improving vocal strength and endurance.  He continues to cough/throat clear with thin liquid.  The patient has been coughing more, but he has COPD/emphysema which complicates the clinical picture.  The patient is not currently taking in sufficient fluid to meet his needs.  He refuses thickened liquids.    Speech Therapy Frequency  2x / week    Duration  Other (comment)    Treatment/Interventions  Pharyngeal strengthening exercises;Diet toleration management by SLP;Oral motor exercises;Compensatory strategies;SLP instruction and feedback;Patient/family education    Potential to Achieve Goals  Good    Potential Considerations  Ability to learn/carryover information;Pain level;Family/community support;Co-morbidities;Previous level of function;Cooperation/participation level;Severity of impairments;Medical prognosis    SLP Home Exercise Plan  Respiratory support activities, voice activities, oral intake log, swallowing recommendations, and things to observe for.      Consulted and Agree with Plan of Care  Patient       Patient will benefit from skilled therapeutic intervention in order to improve the following deficits and impairments:   Dysphagia, oropharyngeal  Dysarthria and anarthria    Problem List Patient Active Problem List   Diagnosis Date Noted  . PEG (percutaneous endoscopic gastrostomy) status (HCC)   . Benign essential HTN   . Hypertensive crisis   . Dysarthria, post-stroke   . Acute pain of right shoulder   . 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 Dysphagia, oropharyngeal 06/23/2017  . Respiratory failure (HCC)   . Palliative care encounter   . Aspiration pneumonia due to gastric secretions (HCC)   . Atrial fibrillation (HCC)   . 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 [place], [place], [address] Phone: [phone]   Fax:  [phone]   Name: Ronald [name] MRN: [id number] Date of Birth: 26-Jan-1941

## 2017-12-05 ENCOUNTER — Ambulatory Visit: Payer: Medicare Other | Admitting: Occupational Therapy

## 2017-12-05 ENCOUNTER — Ambulatory Visit: Payer: Medicare Other | Admitting: Speech Pathology

## 2017-12-05 ENCOUNTER — Encounter: Payer: Self-pay | Admitting: Occupational Therapy

## 2017-12-05 DIAGNOSIS — R41841 Cognitive communication deficit: Secondary | ICD-10-CM | POA: Diagnosis not present

## 2017-12-05 DIAGNOSIS — R278 Other lack of coordination: Secondary | ICD-10-CM | POA: Diagnosis not present

## 2017-12-05 DIAGNOSIS — M6281 Muscle weakness (generalized): Secondary | ICD-10-CM

## 2017-12-05 DIAGNOSIS — H543 Unqualified visual loss, both eyes: Secondary | ICD-10-CM | POA: Diagnosis not present

## 2017-12-05 DIAGNOSIS — R1312 Dysphagia, oropharyngeal phase: Secondary | ICD-10-CM

## 2017-12-05 DIAGNOSIS — R471 Dysarthria and anarthria: Secondary | ICD-10-CM | POA: Diagnosis not present

## 2017-12-05 NOTE — Therapy (Signed)
[place] [facility] MAIN [facility] SERVICES [address] [place], [place], [address] Phone: [phone]   Fax:  [phone]  Occupational Therapy Treatment  Patient Details  Name: JASPAL PULTZ MRN: [id number] Date of Birth: 09-27-1940 Referring Provider: Dr. Wynn Banker   Encounter Date: 12/05/2017  OT End of Session - 12/05/17 1552    Visit Number  20    Number of Visits  24    Date for OT Re-Evaluation  02/14/18    Authorization Type  Visit 3 of 10 for progress report period starting 11/22/2017    OT Start Time  1520    OT Stop Time  1600    OT Time Calculation (min)  40 min    Activity Tolerance  Patient tolerated treatment well    Behavior During Therapy  [facility] for tasks assessed/performed       Past Medical History:  Diagnosis Date  . COPD (chronic obstructive pulmonary disease) (HCC)   . Emphysema lung (HCC)   . Hx of long term use of blood thinners   . Hypertension   . Stroke [facility]) 2013  . Thyroid disease    hypothyroidism    Past Surgical History:  Procedure Laterality Date  . IR ANGIO EXTRACRAN SEL COM CAROTID INNOMINATE UNI R MOD SED  06/19/2017  . IR ANGIO VERTEBRAL SEL SUBCLAVIAN INNOMINATE UNI R MOD SED  06/19/2017  . IR GASTROSTOMY TUBE MOD SED  07/04/2017  . RADIOLOGY WITH ANESTHESIA N/A 06/19/2017   Procedure: RADIOLOGY WITH ANESTHESIA;  Surgeon: Julieanne Cotton, MD;  Location: [facility] OR;  Service: Radiology;  Laterality: N/A;    There were no vitals filed for this visit.  Subjective Assessment - 12/05/17 1550    Subjective   Pt nodded to indicate his day was going well.    Patient is accompained by:  -- Son in law    Pertinent History  Pt. is a 77 y.o. male who was admitted to [facility] with a Right MCA Infarct with Lft Hemiparesis, and severe oropharyngeal dysphagia, and severe dysarthria.    Patient Stated Goals  Patient would like to be as independent as possible.     Currently in Pain?  No/denies       Neuromuscular re-ed:    Pt. worked on nuts and bolt tower using both hands with focus on speed and control and moving nuts and bolts in hand to finger tips without dropping with 80% success rate.   Pt. performed [facility] skills training to improve speed and dexterity needed for ADL tasks and writing. Pt. demonstrated grasping 1 inch sticks,  inch cylindrical collars, and  inch flat washers on the Purdue pegboard. Pt. performed grasping each item with 2nd digit and thumb, and storing them in the palm. Pt. dropped several sticks and collars initially. Pt. worked on removing the sticks by alternating thumb opposition to the tip of 2nd digit through 5th digits.  Therapeutic exercise:    Pt. performed hand strengthening with green theraputty. Pt. required cues for proper technique. Pt. worked on gross grip loop, lateral pinch, 3pt. pinch, gross digit extension, and use of Puttycise tools. Pt. required verbal and tactile cues for proper technique.                        OT Education - 12/05/17 1552    Education provided  Yes    Education Details  fine motor coordination skills, UE strengthening    Person(s) Educated  Patient  HCC)   . Palliative care encounter   . Aspiration pneumonia due to gastric secretions (HCC)   . Atrial fibrillation (HCC)   . Stroke (cerebrum) [facility](HCC) 06/19/2017    Susanne BordersSusan Wofford, OTR/L ascom [phone] 12/05/17, 4:01 PM  [place] [facility] MAIN [facility]HAB SERVICES [address] [place], [place], [address] Phone: [phone]   Fax:  [phone]  Name: Marinus MawOtis L Selmer MRN: [id number] Date of Birth: 1940/12/18  HCC)   . Palliative care encounter   . Aspiration pneumonia due to gastric secretions (HCC)   . Atrial fibrillation (HCC)   . Stroke (cerebrum) [facility](HCC) 06/19/2017    Susanne BordersSusan Wofford, OTR/L ascom [phone] 12/05/17, 4:01 PM  [place] [facility] MAIN [facility] 2[facility]HAB SERVICES [address] [place], [place], [address] Phone: [phone]   Fax:  [phone]  Name: Marinus MawOtis L Selmer MRN: [id number] Date of Birth: 1940/12/18

## 2017-12-06 ENCOUNTER — Encounter: Payer: Self-pay | Admitting: Speech Pathology

## 2017-12-06 NOTE — Therapy (Signed)
[place] MAIN [facility] SERVICES [address] [place], [place], [address] Phone: [phone]   Fax:  [phone]  Speech Language Pathology Treatment  Patient Details  Name: Ronald [name] MRN: [id number] Date of Birth: Sep 10, 1940 Referring Provider: Charlett Blake   Encounter Date: 12/05/2017  End of Session - 12/06/17 1114    Visit Number  27    Number of Visits  33    Date for SLP Re-Evaluation  12/16/17    SLP Start Time  1600    SLP Stop Time   1650    SLP Time Calculation (min)  50 min    Activity Tolerance  Patient tolerated treatment well       Past Medical History:  Diagnosis Date  . COPD (chronic obstructive pulmonary disease) ([place])   . Emphysema lung ([place])   . Hx of long term use of blood thinners   . Hypertension   . Stroke [facility]) 2013  . Thyroid disease    hypothyroidism    Past Surgical History:  Procedure Laterality Date  . IR ANGIO EXTRACRAN SEL COM CAROTID INNOMINATE UNI R MOD SED  06/19/2017  . IR ANGIO VERTEBRAL SEL SUBCLAVIAN INNOMINATE UNI R MOD SED  06/19/2017  . IR GASTROSTOMY TUBE MOD SED  07/04/2017  . RADIOLOGY WITH ANESTHESIA N/A 06/19/2017   Procedure: RADIOLOGY WITH ANESTHESIA;  Surgeon: Luanne Bras, MD;  Location: [place];  Service: Radiology;  Laterality: N/A;    There were no vitals filed for this visit.  Subjective Assessment - 12/06/17 1113    Subjective  Engaged and motivated to drink water. He often points to the "swallow twice" symbol but is not consistently swallowing twice without the reminder from the therapist.             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  The patient received inpatient rehab services  06/23/2017 - 07/04/2017 and home health services on discharge.         Treatment Provided   Treatment provided  Cognitive-Linquistic;Dysphagia      Pain Assessment   Pain Assessment  No/denies pain      Cognitive-Linquistic Treatment   Treatment focused on  Dysarthria;Apraxia;Voice;Other (comment);Patient/family/caregiver education Dysphagia    Skilled Treatment  SWALLOWING:   Patient returned with completed log.  Patient has been coughing more recently, not clear why.  The patient drank 8 ounces of water, with cough or throat clear most trials.  Patient consistently able to generate clear, strong vocal quality after cough indicating clearing of residue.  PHONATION: Patient is able to generate a loud h+vowel.   Loudly recite automatic speech series with 100% accuracy.  Patient able to imitate words/phrases, maintaining loudness for all words with 85% accuracy.        Assessment / Recommendations / Plan   Plan  Continue with current plan of care      Progression Toward Goals   Progression toward goals  Progressing toward goals       SLP Education - 12/06/17 1113    Education provided  Yes    Education Details  cough is protective    Person(s) Educated  Patient    Methods  Explanation    Comprehension  Verbalized understanding  SLP Long Term Goals - 11/16/17 3016      SLP LONG TERM GOAL #1   Title  Pt will improve speech intelligibility for words and phrases by controlling rate of speech, over-articulation, and increased loudness to achieve 80% intelligibility with min. SLP cues.    Time  8    Period  Weeks    Status  Partially Met    Target Date  12/16/17      SLP LONG TERM GOAL #2   Title  Patient will execute Swallowing and Voice Building HEP 5-7 days per week, with assistance from family as needed.    Time  8    Period  Weeks    Status  On-going    Target Date  12/16/17      SLP LONG TERM GOAL #4   Title  Patient will consume 4 oz thin liquid with no more than 1  overt sign/symptom of aspiration.     Status  On-going    Target Date  12/16/17       Plan - 12/06/17 1114    Clinical Impression Statement  The patient demonstrates improving vocal strength and endurance.  He continues to cough/throat clear with thin liquid.  The patient has been coughing more, but he has COPD/emphysema which complicates the clinical picture.  The patient is not currently taking in sufficient fluid to meet his needs.  He refuses thickened liquids.    Speech Therapy Frequency  2x / week    Duration  Other (comment)    Treatment/Interventions  Pharyngeal strengthening exercises;Diet toleration management by SLP;Oral motor exercises;Compensatory strategies;SLP instruction and feedback;Patient/family education    Potential to Achieve Goals  Good    Potential Considerations  Ability to learn/carryover information;Pain level;Family/community support;Co-morbidities;Previous level of function;Cooperation/participation level;Severity of impairments;Medical prognosis    SLP Home Exercise Plan  Respiratory support activities, voice activities, oral intake log, swallowing recommendations, and things to observe for.      Consulted and Agree with Plan of Care  Patient       Patient will benefit from skilled therapeutic intervention in order to improve the following deficits and impairments:   Oropharyngeal dysphagia  Dysarthria and anarthria    Problem List Patient Active Problem List   Diagnosis Date Noted  . PEG (percutaneous endoscopic gastrostomy) status ([place])   . Benign essential HTN   . Hypertensive crisis   . Dysarthria, post-stroke   . Acute pain of right shoulder   . 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 Dysphagia, oropharyngeal 06/23/2017  . Respiratory failure ([name])   . Palliative care encounter   . Aspiration pneumonia due to gastric secretions ([place])   . Atrial fibrillation ([place])   . Stroke (cerebrum) ([place]) 06/19/2017   [name], [name]  SLP  Lou Miner 12/06/2017, 11:15 AM  [place] MAIN [facility] SERVICES [address] [place], [place], [address] Phone: [phone]   Fax:  [phone]   Name: Ronald [name] MRN: [id number] Date of Birth: 04-24-1941

## 2017-12-07 ENCOUNTER — Ambulatory Visit: Payer: Medicare Other | Admitting: Physical Therapy

## 2017-12-07 ENCOUNTER — Ambulatory Visit: Payer: Medicare Other | Admitting: Speech Pathology

## 2017-12-07 DIAGNOSIS — R471 Dysarthria and anarthria: Secondary | ICD-10-CM | POA: Diagnosis not present

## 2017-12-07 DIAGNOSIS — R1312 Dysphagia, oropharyngeal phase: Secondary | ICD-10-CM

## 2017-12-07 DIAGNOSIS — R278 Other lack of coordination: Secondary | ICD-10-CM | POA: Diagnosis not present

## 2017-12-07 DIAGNOSIS — R41841 Cognitive communication deficit: Secondary | ICD-10-CM | POA: Diagnosis not present

## 2017-12-07 DIAGNOSIS — M6281 Muscle weakness (generalized): Secondary | ICD-10-CM | POA: Diagnosis not present

## 2017-12-07 DIAGNOSIS — H543 Unqualified visual loss, both eyes: Secondary | ICD-10-CM | POA: Diagnosis not present

## 2017-12-07 NOTE — Progress Notes (Signed)
[facility] [address] [place]. [place] [address] [phone]       OFFICE FOLLOW-UP NOTE  [name] Date of Birth:  13-Jan-1941 Medical Record Number:  [id number]   Mr. Perito is a 77 year old Caucasian male seen today for follow-up for stroke in February 2019.  He is accompanied by his daughter.  History is obtained from them and review of electronic medical records.  I have personally reviewed imaging films. HPI: Mr.Ly L Burnsis a 77 y.o.malewith PMH of AFIB on Coumadin with subtherapeutic INR, Hx of CVA, HTN and Hypothyroidism who presents with acute left-sided weakness and questionable M2 occlusion with improvement following IV TPA. Due to patients persistent deficits an angiogram was done to assess if there was truly an LVO and he was taken for this which was negative.  He was admitted to the intensive care unit and follow-up imaging showed hemorrhagic conversion in the right temporal lobe with mild mass-effect but no midline shift.  His blood pressure was tightly controlled he was neurologically monitored remained stable.  He had significant dysphagia and dysarthria.  He was seen by physical occupational and speech therapist.  He was initially started on aspirin due to hemorrhagic transformation and transferred to inpatient rehab and subsequently has been switched to warfarin now.   09/07/17 visit PS: He still has significant dysarthria and dysphagia and has a PEG tube for feeding.  He is undergoing outpatient therapies.  He had modified barium swallow done yesterday and has been cleared for honey thick liquids.  His INR has been fluctuating but last INR was 2.1 earlier this week.  He is able to ambulate independently but has some balance issues but has had no falls or injuries.  He complains of occipital headache as well as pain shooting down his left shoulder.  He does have a history of degenerative cervical spine disease and was not considered a surgical  candidate in the past.  He does take some Tylenol which seems to help.  He is presently living at home with his daughter.  Interval History 12/08/17: Patient is being seen today for follow-up visit and is accompanied by his daughter.  He continues to have significant dysarthria for which she is undergoing speech therapy.  He is also working with speech therapy in hopes of removing PEG tube.  He refuses nectar thickened liquids and his therapist is currently working with him on thin liquid intake without aspiration.  He currently is taking in approximately 1000 mL of fluid by mouth daily but he needs at least 1500 mL/day prior to his tube removal.  He is continuing to receive extra fluid intake through his PEG tube.  He also continues to participate in OT for mild left hemiparesis.  He continues to take Coumadin without side effects of bleeding or bruising and this is monitored by his PCP/cardiologist and INR has been stable.  He also continues to take Lipitor without side effects of myalgias and PCP continues to monitor lipid levels.  Blood pressure today mildly elevated at 156/89 but this is monitored at home and typically 130s over 70s.  He is currently living with his daughter but is able to maintain all ADLs without difficulty.  Denies new or worsening stroke/TIA symptoms.   ROS:   14 system review of systems is positive for speech difficulty and all other systems negative PMH:  Past Medical History:  Diagnosis Date  . COPD (chronic obstructive pulmonary disease) (HCC)   . Emphysema lung (HCC)   .  Hx of long term use of blood thinners   . Hypertension   . Stroke [facility]) 2013  . Thyroid disease    hypothyroidism    Social History:  Social History   Socioeconomic History  . Marital status: Single    Spouse name: Not on file  . Number of children: Not on file  . Years of education: Not on file  . Highest education level: Not on file  Occupational History  . Not on file  Social Needs  .  Financial resource strain: Not on file  . Food insecurity:    Worry: Not on file    Inability: Not on file  . Transportation needs:    Medical: Not on file    Non-medical: Not on file  Tobacco Use  . Smoking status: Former Smoker    Last attempt to quit: 06/05/2017    Years since quitting: 0.5  . Smokeless tobacco: Former Engineer, water and Sexual Activity  . Alcohol use: Not Currently  . Drug use: No  . Sexual activity: Not on file  Lifestyle  . Physical activity:    Days per week: Not on file    Minutes per session: Not on file  . Stress: Not on file  Relationships  . Social connections:    Talks on phone: Not on file    Gets together: Not on file    Attends religious service: Not on file    Active member of club or organization: Not on file    Attends meetings of clubs or organizations: Not on file    Relationship status: Not on file  . Intimate partner violence:    Fear of current or ex partner: Not on file    Emotionally abused: Not on file    Physically abused: Not on file    Forced sexual activity: Not on file  Other Topics Concern  . Not on file  Social History Narrative  . Not on file    Medications:   Current Outpatient Medications on File Prior to Visit  Medication Sig Dispense Refill  . atorvastatin (LIPITOR) 40 MG tablet Place 1 tablet (40 mg total) into feeding tube daily at 6 PM. 30 tablet 1  . diclofenac sodium (VOLTAREN) 1 % GEL Apply 2 g topically 4 (four) times daily. 1 Tube 1  . famotidine (PEPCID AC) 10 MG chewable tablet Place 10 mg into feeding tube 2 (two) times daily.     Marland Kitchen levothyroxine (SYNTHROID, LEVOTHROID) 125 MCG tablet Place 1 tablet (125 mcg total) into feeding tube daily before breakfast. 30 tablet 1  . Metoprolol Succinate 50 MG CS24 Take by mouth.    . warfarin (COUMADIN) 5 MG tablet Take 1 tablet (5 mg total) by mouth daily after supper. (Patient taking differently: Place 5 mg into feeding tube daily after supper. 5mg  mon-fri and  2.5mg  sat and sun) 30 tablet 2  . Water For Irrigation, Sterile (FREE WATER) SOLN Place 200 mLs into feeding tube 4 (four) times daily.     No current facility-administered medications on file prior to visit.     Allergies:  No Known Allergies  Physical Exam General: Frail elderly Caucasian male seated, in no evident distress.  He has a PEG tube in his abdomen Head: head normocephalic and atraumatic.  Neck: supple with no carotid or supraclavicular bruits Cardiovascular: regular rate and rhythm, no murmurs Musculoskeletal: Mild kyphosis Skin:  no rash/petichiae Vascular:  Normal pulses all extremities  Vitals:   12/08/17 0805  BP: (!) 156/89  Pulse: 91   Neurologic Exam Mental Status: Awake and fully alert. Oriented to place and time. Recent and remote memory intact. Attention span, concentration and fund of knowledge appropriate. Mood and affect appropriate.  Severe dysarthria and can be understood with some difficulty.  Was able to state his name and date of birth along with naming objects on NIH SS card. Cranial Nerves: Fundoscopic exam reveals sharp disc margins. Pupils equal, briskly reactive to light. Extraocular movements full without nystagmus. Visual fields full to confrontation. Hearing intact. Facial sensation intact.  Moderate left lower facial weakness including weakness of eye closure., tongue, palate moves normally and symmetrically.  Motor: Normal bulk and tone. Normal strength in all tested extremity muscles.  Except mild left grip weakness and left lower extremity hip flexor weakness.  Diminished fine finger movements on the left.  Orbits right over left upper extremity. Sensory.: intact to touch ,pinprick .position and vibratory sensation.  Coordination: Rapid alternating movements normal on right side.  Finger-to-nose and heel-to-shin performed accurately on right side. Gait and Station: Arises from chair without difficulty. Stance is normal. Gait demonstrates normal  stride length and balance . Able to heel, toe and tandem walk with moderate difficulty.  Reflexes: 1+ and asymmetric and brisker on the left side.. Toes downgoing.    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  He has significant residual dysarthria and dysphagia.  Vascular risk factors of hyperlipidemia and atrial fibrillation.  Patient returns today for follow-up visit and overall is doing well with continued improvement in his dysarthria, dysphagia and left hemiparesis.    PLAN: -Continue warfarin daily  and Lipitor for secondary stroke prevention -F/u with PCP regarding your HLD, HTN and Coumadin management -continue to monitor BP at home -Continue OT/ST and was advised if additional sessions are needed to call us we can place additional orders -Advised to continue to stay active and maintain a healthy diet -Maintain strict control of hypertension with blood pressure goal below 130/90, diabetes with hemoglobin A1c goal below 6.5% and cholesterol with LDL cholesterol (bad cholesterol) goal below 70 mg/dL. I also advised the patient to eat a healthy diet with plenty of whole grains, cereals, fruits and vegetables, exercise regularly and maintain ideal body weight.  Follow up in 6 months or call earlier if needed  Greater than 50% of time during this 25 minute visit was spent on counseling,explanation of diagnosis of embolic stroke, atrial fibrillation, planning of further management, discussion with patient and family and coordination of care  George Hugh, [place]  [facility] [address] [place], [place] [address]  Phone [phone] Fax [phone] Note: This document was prepared with digital dictation and possible smart phrase technology. Any transcriptional errors that result from this process are  unintentional.

## 2017-12-08 ENCOUNTER — Encounter: Payer: Self-pay | Admitting: Adult Health

## 2017-12-08 ENCOUNTER — Ambulatory Visit (INDEPENDENT_AMBULATORY_CARE_PROVIDER_SITE_OTHER): Payer: Medicare Other | Admitting: Adult Health

## 2017-12-08 ENCOUNTER — Encounter: Payer: Self-pay | Admitting: Speech Pathology

## 2017-12-08 VITALS — BP 156/89 | HR 91 | Wt 173.2 lb

## 2017-12-08 DIAGNOSIS — E785 Hyperlipidemia, unspecified: Secondary | ICD-10-CM

## 2017-12-08 DIAGNOSIS — Z931 Gastrostomy status: Secondary | ICD-10-CM

## 2017-12-08 DIAGNOSIS — I482 Chronic atrial fibrillation, unspecified: Secondary | ICD-10-CM

## 2017-12-08 DIAGNOSIS — I63411 Cerebral infarction due to embolism of right middle cerebral artery: Secondary | ICD-10-CM

## 2017-12-08 DIAGNOSIS — I10 Essential (primary) hypertension: Secondary | ICD-10-CM | POA: Diagnosis not present

## 2017-12-08 NOTE — Patient Instructions (Signed)
Continue warfarin daily  and lipitor  for secondary stroke prevention  Continue to follow up with PCP regarding cholesterol, blood pressure and coumadin management   Continue OT/ST - if you need additional orders, let us know  Continue to monitor blood pressure at home  Maintain strict control of hypertension with blood pressure goal below 130/90, diabetes with hemoglobin A1c goal below 6.5% and cholesterol with LDL cholesterol (bad cholesterol) goal below 70 mg/dL. I also advised the patient to eat a healthy diet with plenty of whole grains, cereals, fruits and vegetables, exercise regularly and maintain ideal body weight.  Followup in the future with me in 6 months or call earlier if needed       Thank you for coming [facility]. I hope we have been able to provide you high quality care today.  You may receive a patient satisfaction survey over the next few weeks. We would appreciate your feedback and comments so that we may continue to improve ourselves and the health of our patients.

## 2017-12-08 NOTE — Therapy (Signed)
[place] MAIN [facility] SERVICES [address] [place], [place], [address] Phone: [phone]   Fax:  [phone]  Speech Language Pathology Treatment  Patient Details  Name: Ronald [name] MRN: [id number] Date of Birth: [birth year] Referring Provider: Charlett Blake   Encounter Date: 12/07/2017  End of Session - 12/08/17 1603    Visit Number  28    Number of Visits  33    Date for SLP Re-Evaluation  12/16/17    SLP Start Time  1600    SLP Stop Time   1650    SLP Time Calculation (min)  50 min    Activity Tolerance  Patient tolerated treatment well       Past Medical History:  Diagnosis Date  . COPD (chronic obstructive pulmonary disease) ([place])   . Emphysema lung ([place])   . Hx of long term use of blood thinners   . Hypertension   . Stroke [facility]) 2013  . Thyroid disease    hypothyroidism    Past Surgical History:  Procedure Laterality Date  . IR ANGIO EXTRACRAN SEL COM CAROTID INNOMINATE UNI R MOD SED  06/19/2017  . IR ANGIO VERTEBRAL SEL SUBCLAVIAN INNOMINATE UNI R MOD SED  06/19/2017  . IR GASTROSTOMY TUBE MOD SED  07/04/2017  . RADIOLOGY WITH ANESTHESIA N/A 06/19/2017   Procedure: RADIOLOGY WITH ANESTHESIA;  Surgeon: Luanne Bras, MD;  Location: [place];  Service: Radiology;  Laterality: N/A;    There were no vitals filed for this visit.  Subjective Assessment - 12/08/17 1602    Subjective  Engaged and motivated to drink water. He often points to the "swallow twice" symbol but is not consistently swallowing twice without the reminder from the therapist.             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  The patient received inpatient rehab services  06/23/2017 - 07/04/2017 and home health services on discharge.         Treatment Provided   Treatment provided  Cognitive-Linquistic;Dysphagia      Pain Assessment   Pain Assessment  No/denies pain      Cognitive-Linquistic Treatment   Treatment focused on  Dysarthria;Apraxia;Voice;Other (comment);Patient/family/caregiver education   Dysphagia   Skilled Treatment  SWALLOWING:   Patient returned with completed log.  Patient has been coughing more recently, not clear why.  The patient drank 8 ounces of water, with cough or throat clear most trials.  Patient consistently able to generate clear, strong vocal quality after cough indicating clearing of residue.  PHONATION: Patient is able to generate a loud h+vowel.   Loudly recite automatic speech series with 100% accuracy.  Patient able to imitate words/phrases, maintaining loudness for all words with 85% accuracy.        Assessment / Recommendations / Plan   Plan  Continue with current plan of care      Progression Toward Goals   Progression toward goals  Progressing toward goals       SLP Education - 12/08/17 1602    Education provided  Yes    Education Details  Reminders to swallow 2 times before attemtping to speak    Person(s) Educated  Patient    Methods  Explanation  Comprehension  Verbalized understanding         SLP Long Term Goals - 11/16/17 8882      SLP LONG TERM GOAL #1   Title  Pt will improve speech intelligibility for words and phrases by controlling rate of speech, over-articulation, and increased loudness to achieve 80% intelligibility with min. SLP cues.    Time  8    Period  Weeks    Status  Partially Met    Target Date  12/16/17      SLP LONG TERM GOAL #2   Title  Patient will execute Swallowing and Voice Building HEP 5-7 days per week, with assistance from family as needed.    Time  8    Period  Weeks    Status  On-going    Target Date  12/16/17      SLP LONG TERM GOAL #4   Title  Patient will consume  4 oz thin liquid with no more than 1 overt sign/symptom of aspiration.     Status  On-going    Target Date  12/16/17       Plan - 12/08/17 1603    Clinical Impression Statement  The patient demonstrates improving vocal strength and endurance.  He continues to cough/throat clear with thin liquid.  The patient has been coughing more, but he has COPD/emphysema which complicates the clinical picture.  The patient is not currently taking in sufficient fluid to meet his needs.  He refuses thickened liquids.    Speech Therapy Frequency  2x / week    Duration  Other (comment)    Treatment/Interventions  Pharyngeal strengthening exercises;Diet toleration management by SLP;Oral motor exercises;Compensatory strategies;SLP instruction and feedback;Patient/family education    Potential to Achieve Goals  Good    Potential Considerations  Ability to learn/carryover information;Pain level;Family/community support;Co-morbidities;Previous level of function;Cooperation/participation level;Severity of impairments;Medical prognosis    SLP Home Exercise Plan  Respiratory support activities, voice activities, oral intake log, swallowing recommendations, and things to observe for.      Consulted and Agree with Plan of Care  Patient       Patient will benefit from skilled therapeutic intervention in order to improve the following deficits and impairments:   Oropharyngeal dysphagia  Dysarthria and anarthria    Problem List Patient Active Problem List   Diagnosis Date Noted  . PEG (percutaneous endoscopic gastrostomy) status ([place])   . Benign essential HTN   . Hypertensive crisis   . Dysarthria, post-stroke   . Acute pain of right shoulder   . 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 Dysphagia, oropharyngeal 06/23/2017  . Respiratory failure ([place])   . Palliative care encounter   . Aspiration pneumonia due to gastric secretions ([place])   . Atrial fibrillation ([place])   . Stroke (cerebrum) ([place]) 06/19/2017    [name], [name] SLP  Lou Miner 12/08/2017, 4:04 PM  [place] MAIN [facility] SERVICES [address] [place], [place], [address] Phone: [phone]   Fax:  [phone]   Name: EMANUAL LAMOUNTAIN MRN: [id number] Date of Birth: Feb 20, 1941

## 2017-12-11 ENCOUNTER — Encounter: Payer: Self-pay | Admitting: Physical Medicine & Rehabilitation

## 2017-12-11 ENCOUNTER — Ambulatory Visit (HOSPITAL_BASED_OUTPATIENT_CLINIC_OR_DEPARTMENT_OTHER): Payer: Medicare Other | Admitting: Physical Medicine & Rehabilitation

## 2017-12-11 ENCOUNTER — Encounter: Payer: Medicare Other | Attending: Physical Medicine & Rehabilitation

## 2017-12-11 VITALS — BP 164/77 | HR 69 | Resp 14 | Ht 69.0 in | Wt 174.0 lb

## 2017-12-11 DIAGNOSIS — I69398 Other sequelae of cerebral infarction: Secondary | ICD-10-CM | POA: Diagnosis not present

## 2017-12-11 DIAGNOSIS — I10 Essential (primary) hypertension: Secondary | ICD-10-CM | POA: Insufficient documentation

## 2017-12-11 DIAGNOSIS — I639 Cerebral infarction, unspecified: Secondary | ICD-10-CM | POA: Diagnosis not present

## 2017-12-11 DIAGNOSIS — R1312 Dysphagia, oropharyngeal phase: Secondary | ICD-10-CM | POA: Insufficient documentation

## 2017-12-11 DIAGNOSIS — Z7901 Long term (current) use of anticoagulants: Secondary | ICD-10-CM | POA: Insufficient documentation

## 2017-12-11 DIAGNOSIS — I69322 Dysarthria following cerebral infarction: Secondary | ICD-10-CM | POA: Diagnosis not present

## 2017-12-11 DIAGNOSIS — Z931 Gastrostomy status: Secondary | ICD-10-CM | POA: Diagnosis not present

## 2017-12-11 DIAGNOSIS — J449 Chronic obstructive pulmonary disease, unspecified: Secondary | ICD-10-CM | POA: Diagnosis not present

## 2017-12-11 DIAGNOSIS — I4891 Unspecified atrial fibrillation: Secondary | ICD-10-CM | POA: Diagnosis not present

## 2017-12-11 DIAGNOSIS — R269 Unspecified abnormalities of gait and mobility: Secondary | ICD-10-CM | POA: Diagnosis not present

## 2017-12-11 DIAGNOSIS — Z87891 Personal history of nicotine dependence: Secondary | ICD-10-CM | POA: Insufficient documentation

## 2017-12-11 DIAGNOSIS — E039 Hypothyroidism, unspecified: Secondary | ICD-10-CM | POA: Diagnosis not present

## 2017-12-11 NOTE — Patient Instructions (Signed)
You will need to take all meds, fluids and meals by mouth  The radiology dept will remove the tube

## 2017-12-11 NOTE — Progress Notes (Signed)
Subjective:    Patient ID: Ronald [name], male    DOB: Nov 29, 1940, 77 y.o.   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  He has continued with outpatient speech therapy.  He has been sustaining a normal weight of around 175 pounds over the last couple months without tube feeding.  He has reduced his fluid boluses to 100 mL 3 times daily basically to keep the tube open.  He has been taking his meds with applesauce.  Sometimes he uses the tube just because he does not like the taste of the medications according to the daughter. He is here to be evaluated for PEG removal. He has had no problems with discharge from the PEG site no abdominal pain. He has not had any respiratory infections such as pneumonia in the interval time since I saw him last month  Pain Inventory Average Pain 0 Pain Right Now 0 My pain is no pain  In the last 24 hours, has pain interfered with the following? General activity 0 Relation with others 0 Enjoyment of life 0 What TIME of day is your pain at its worst? no pain Sleep (in general) Good  Pain is worse with: no pain Pain improves with: no pain Relief from Meds: no pain  Mobility walk without assistance Do you have any goals in this area?  no  Function retired Do you have any goals in this area?  no  Neuro/Psych No problems in this area  Prior Studies Any changes since last visit?  no  Physicians involved in your care Any changes since last visit?  no   Family History  Problem Relation Age of Onset  . Parkinson's disease Mother   . Colon cancer Mother   . Emphysema Father   . Stroke Sister   . Stroke Brother    Social History   Socioeconomic History  . Marital status: Single    Spouse name: Not on file  . Number of children: Not on file  . Years of education: Not on file  . Highest education  level: Not on file  Occupational History  . Not on file  Social Needs  . Financial resource strain: Not on file  . Food insecurity:    Worry: Not on file    Inability: Not on file  . Transportation needs:    Medical: Not on file    Non-medical: Not on file  Tobacco Use  . Smoking status: Former Smoker    Last attempt to quit: 06/05/2017    Years since quitting: 0.5  . Smokeless tobacco: Former Engineer, water and Sexual Activity  . Alcohol use: Not Currently  . Drug use: No  . Sexual activity: Not on file  Lifestyle  . Physical activity:    Days per week: Not on file    Minutes per session: Not on file  . Stress: Not on file  Relationships  . Social connections:    Talks on phone: Not on file    Gets together: Not on file    Attends religious service: Not on file    Active member of club or organization: Not on file    Attends meetings of clubs or organizations: Not on file    Relationship status: Not on file  Other Topics Concern  . Not on file  Social History Narrative  .  Not on file   Past Surgical History:  Procedure Laterality Date  . IR ANGIO EXTRACRAN SEL COM CAROTID INNOMINATE UNI R MOD SED  06/19/2017  . IR ANGIO VERTEBRAL SEL SUBCLAVIAN INNOMINATE UNI R MOD SED  06/19/2017  . IR GASTROSTOMY TUBE MOD SED  07/04/2017  . RADIOLOGY WITH ANESTHESIA N/A 06/19/2017   Procedure: RADIOLOGY WITH ANESTHESIA;  Surgeon: Julieanne Cotton, MD;  Location: [facility] OR;  Service: Radiology;  Laterality: N/A;   Past Medical History:  Diagnosis Date  . COPD (chronic obstructive pulmonary disease) (HCC)   . Emphysema lung (HCC)   . Hx of long term use of blood thinners   . Hypertension   . Stroke [facility]) 2013  . Thyroid disease    hypothyroidism   BP (!) 164/77 (BP Location: Left Arm, Patient Position: Sitting, Cuff Size: Normal)   Pulse 69   Resp 14   Ht 5\' 9"  (1.753 m)   Wt 174 lb (78.9 kg)   SpO2 97%   BMI 25.70 kg/m   Opioid Risk Score:   Fall Risk Score:   `1  Depression screen PHQ 2/9  Depression screen PHQ 2/9 10/02/2017  Decreased Interest 0  Down, Depressed, Hopeless 0  PHQ - 2 Score 0    Review of Systems  Constitutional: Negative.   HENT: Negative.   Eyes: Negative.   Respiratory: Negative.   Cardiovascular: Negative.   Gastrointestinal: Negative.   Endocrine: Negative.   Genitourinary: Negative.   Musculoskeletal: Negative.   Skin: Negative.   Allergic/Immunologic: Negative.   Neurological: Positive for speech difficulty.  Hematological: Negative.   Psychiatric/Behavioral: Negative.   All other systems reviewed and are negative.      Objective:   Physical Exam  Constitutional: He is oriented to person, place, and time. He appears well-developed and well-nourished. No distress.  HENT:  Head: Normocephalic and atraumatic.  Eyes: Pupils are equal, round, and reactive to light. EOM are normal.  Neck: Normal range of motion.  Pulmonary/Chest: Effort normal and breath sounds normal. No respiratory distress.  Abdominal: Soft. Bowel sounds are normal. He exhibits no distension.  Musculoskeletal: Normal range of motion. He exhibits no edema or deformity.  Neurological: He is alert and oriented to person, place, and time.  Skin: Skin is warm and dry. He is not diaphoretic.  Psychiatric: He has a normal mood and affect.  Vitals reviewed.  PEG site clean dry and intact there is no tenderness or erythema around the site. Is motor strength remains at 4/5 bilateral deltoid bicep tricep grip 5/5 bilateral hip flexor knee extensor ankle dorsiflexor Gait is without evidence of toe drag or knee instability Speech remains severely dysarthric. Lungs are clear irregularly irregular Abdomen positive bowel sounds soft nontender       Assessment & Plan:  #1.  Severe dysphagia after stroke now improved after extensive inpatient and outpatient therapy.  He is now approximately 6 months post CVA.  He is maintaining a normal weight  without tube feeding.  He is maintaining his hydration without any fluid boluses.  He is able to take pills with applesauce. As discussed with patient and his daughter I think it is appropriate for tube removal.  Will refer to radiology Patient is on warfarin would defer to radiology protocol in terms of stopping this prior to tube removal.

## 2017-12-12 ENCOUNTER — Ambulatory Visit: Payer: Medicare Other | Admitting: Speech Pathology

## 2017-12-12 ENCOUNTER — Encounter: Payer: Medicare Other | Admitting: Physical Therapy

## 2017-12-12 DIAGNOSIS — R1312 Dysphagia, oropharyngeal phase: Secondary | ICD-10-CM

## 2017-12-12 DIAGNOSIS — R278 Other lack of coordination: Secondary | ICD-10-CM | POA: Diagnosis not present

## 2017-12-12 DIAGNOSIS — M6281 Muscle weakness (generalized): Secondary | ICD-10-CM | POA: Diagnosis not present

## 2017-12-12 DIAGNOSIS — H543 Unqualified visual loss, both eyes: Secondary | ICD-10-CM | POA: Diagnosis not present

## 2017-12-12 DIAGNOSIS — R471 Dysarthria and anarthria: Secondary | ICD-10-CM

## 2017-12-12 DIAGNOSIS — R41841 Cognitive communication deficit: Secondary | ICD-10-CM | POA: Diagnosis not present

## 2017-12-13 ENCOUNTER — Encounter: Payer: Self-pay | Admitting: Speech Pathology

## 2017-12-13 NOTE — Therapy (Signed)
SLP LONG TERM GOAL #1   Title  Pt will improve speech intelligibility for words and phrases by controlling rate of speech, over-articulation, and increased loudness to achieve 80% intelligibility with min. SLP cues.    Time  4    Period  Weeks    Status  Partially Met    Target Date  01/13/18      SLP LONG TERM GOAL #2   Title  Patient will execute Swallowing and Voice Building HEP 5-7 days per week, with assistance from family as needed.    Time  4    Period  Weeks    Status  Partially Met    Target Date  01/13/18      SLP LONG TERM GOAL #4   Title  Patient will consume 4 oz thin liquid with no more than 1 overt sign/symptom of aspiration.     Time  4    Status  Partially Met    Target Date  01/13/18       Plan -  12/13/17 1126    Clinical Impression Statement  The patient demonstrates improving vocal strength and endurance.  He continues to cough/throat clear with liquids, but is consistently able to generate clear, strong vocal quality after cough indicating clearing of residue.  The patient has coughing, but he has COPD/emphysema which complicates the clinical picture.  The patient is now taking in sufficient fluid to meet his needs and will have his PEG removed soon.      Speech Therapy Frequency  2x / week    Duration  4 weeks    Treatment/Interventions  Pharyngeal strengthening exercises;Diet toleration management by SLP;Oral motor exercises;Compensatory strategies;SLP instruction and feedback;Patient/family education    Potential to Achieve Goals  Good    Potential Considerations  Ability to learn/carryover information;Pain level;Family/community support;Co-morbidities;Previous level of function;Cooperation/participation level;Severity of impairments;Medical prognosis    SLP Home Exercise Plan  Respiratory support activities, voice activities, oral intake log, swallowing recommendations, and things to observe for.      Consulted and Agree with Plan of Care  Patient       Patient will benefit from skilled therapeutic intervention in order to improve the following deficits and impairments:   Oropharyngeal dysphagia - Plan: SLP plan of care cert/re-cert  Dysarthria and anarthria - Plan: SLP plan of care cert/re-cert    Problem List Patient Active Problem List   Diagnosis Date Noted  . PEG (percutaneous endoscopic gastrostomy) status ([place])   . Benign essential HTN   . Hypertensive crisis   . Dysarthria, post-stroke   . Acute pain of right shoulder   . 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 Dysphagia, oropharyngeal 06/23/2017  . Respiratory failure ([place])   . Palliative care encounter   . Aspiration pneumonia due to gastric secretions ([place])   . Atrial fibrillation ([place])   . Stroke  (cerebrum) ([place]) 06/19/2017   Ronald Sea, [name] SLP  Lou Miner 12/13/2017, 11:30 AM  [place] [facility] SERVICES [address] [place], [place], [address] Phone: [phone]   Fax:  [phone]   Name: [name] MRN: [id number] Date of Birth: 1940-05-08  [place] MAIN [facility] SERVICES [address] [place], [place], [address] Phone: [phone]   Fax:  [phone]  Speech Language Pathology Treatment  Patient Details  Name: Ronald [name] MRN: [id number] Date of Birth: 1941-03-12 Referring Provider: Charlett Blake   Encounter Date: 12/12/2017  End of Session - 12/13/17 1123    Visit Number  29    Number of Visits  37    Date for SLP Re-Evaluation  01/13/18    SLP Start Time  1445    SLP Stop Time   1530    SLP Time Calculation (min)  45 min    Activity Tolerance  Patient tolerated treatment well       Past Medical History:  Diagnosis Date  . COPD (chronic obstructive pulmonary disease) ([place])   . Emphysema lung ([place])   . Hx of long term use of blood thinners   . Hypertension   . Stroke [facility]) 2013  . Thyroid disease    hypothyroidism    Past Surgical History:  Procedure Laterality Date  . IR ANGIO EXTRACRAN SEL COM CAROTID INNOMINATE UNI R MOD SED  06/19/2017  . IR ANGIO VERTEBRAL SEL SUBCLAVIAN INNOMINATE UNI R MOD SED  06/19/2017  . IR GASTROSTOMY TUBE MOD SED  07/04/2017  . RADIOLOGY WITH ANESTHESIA N/A 06/19/2017   Procedure: RADIOLOGY WITH ANESTHESIA;  Surgeon: Luanne Bras, MD;  Location: [facility];  Service: Radiology;  Laterality: N/A;    There were no vitals filed for this visit.  Subjective Assessment - 12/13/17 1122    Subjective  Patient reports that he will get his PEG removed soon            ADULT SLP TREATMENT - 12/13/17 0001      General Information   Behavior/Cognition  Alert;Cooperative;Pleasant mood;Distractible    HPI  77 year old man, with history of prior stroke with speech and language deficits, with right hemisphere CVA 06/19/2017 with profound dysarthria and severe oropharyngeal dysphagia.  The patient received inpatient rehab services 06/23/2017 - 07/04/2017 and home health services on discharge.         Treatment Provided   Treatment provided   Cognitive-Linquistic      Pain Assessment   Pain Assessment  No/denies pain      Cognitive-Linquistic Treatment   Treatment focused on  Dysarthria;Apraxia;Voice;Other (comment);Patient/family/caregiver education   Dysphagia   Skilled Treatment  SWALLOWING:   Patient returned with completed log. Patient will have his PEG removed soon.  The patient drank 8 ounces of water, with cough or throat clear most trials.  Patient consistently able to generate clear, strong vocal quality after cough indicating clearing of residue.  PHONATION: Patient is able to generate a loud h+vowel.   Loudly recite automatic speech series with 100% accuracy.  Patient able to imitate words/phrases, maintaining loudness for all words with 85% accuracy.        Assessment / Recommendations / Plan   Plan  Continue with current plan of care      Progression Toward Goals   Progression toward goals  Progressing toward goals       SLP Education - 12/13/17 1122    Education provided  Yes    Education Details  Don't talk with the mouth full    Person(s) Educated  Patient    Methods  Explanation    Comprehension  Verbalized understanding         SLP Long Term Goals - 12/13/17 1126

## 2017-12-14 ENCOUNTER — Ambulatory Visit: Payer: Medicare Other | Admitting: Physical Therapy

## 2017-12-14 ENCOUNTER — Ambulatory Visit: Payer: Medicare Other | Admitting: Speech Pathology

## 2017-12-14 NOTE — Progress Notes (Signed)
I agree with the above plan 

## 2017-12-19 ENCOUNTER — Ambulatory Visit: Payer: Medicare Other | Admitting: Speech Pathology

## 2017-12-19 ENCOUNTER — Encounter: Payer: Self-pay | Admitting: Occupational Therapy

## 2017-12-19 ENCOUNTER — Ambulatory Visit: Payer: Medicare Other | Admitting: Occupational Therapy

## 2017-12-19 DIAGNOSIS — R1312 Dysphagia, oropharyngeal phase: Secondary | ICD-10-CM | POA: Diagnosis not present

## 2017-12-19 DIAGNOSIS — R471 Dysarthria and anarthria: Secondary | ICD-10-CM | POA: Diagnosis not present

## 2017-12-19 DIAGNOSIS — R41841 Cognitive communication deficit: Secondary | ICD-10-CM | POA: Diagnosis not present

## 2017-12-19 DIAGNOSIS — M6281 Muscle weakness (generalized): Secondary | ICD-10-CM

## 2017-12-19 DIAGNOSIS — H543 Unqualified visual loss, both eyes: Secondary | ICD-10-CM | POA: Diagnosis not present

## 2017-12-19 DIAGNOSIS — R278 Other lack of coordination: Secondary | ICD-10-CM | POA: Diagnosis not present

## 2017-12-19 NOTE — Therapy (Signed)
[name] SERVICES [address] [name], [place], [address] Phone: [phone]   Fax:  [phone]  Occupational Therapy Treatment  Patient Details  Name: Ronald [name] MRN: [id number] Date of Birth: 03-27-1941 Referring Provider: Dr. Wynn Banker   Encounter Date: 12/19/2017  OT End of Session - 12/19/17 1652    Visit Number  21    Number of Visits  24    Date for OT Re-Evaluation  02/14/18    Authorization Type  Visit 4 of 10 for progress report period starting 11/22/2017    OT Start Time  1600    OT Stop Time  1645    OT Time Calculation (min)  45 min    Activity Tolerance  Patient tolerated treatment well    Behavior During Therapy  [name] for tasks assessed/performed       Past Medical History:  Diagnosis Date  . COPD (chronic obstructive pulmonary disease) (H[facility])   . Emphysema lung (H[facility])   . Hx of long term use of blood thinners   . Hypertension   . Stroke [name]) 2013  . Thyroid disease    hypothyroidism    Past Surgical History:  Procedure Laterality Date  . IR ANGIO EXTRACRAN SEL COM CAROTID INNOMINATE UNI R MOD SED  06/19/2017  . IR ANGIO VERTEBRAL SEL SUBCLAVIAN INNOMINATE UNI R MOD SED  06/19/2017  . IR GASTROSTOMY TUBE MOD SED  07/04/2017  . RADIOLOGY WITH ANESTHESIA N/A 06/19/2017   Procedure: RADIOLOGY WITH ANESTHESIA;  Surgeon: Julieanne Cotton, MD;  [facility];  Service: Radiology;  Laterality: N/A;    There were no vitals filed for this visit.  Subjective Assessment - 12/19/17 1651    Pertinent History  Pt. is a 77 y.o. male who was admitted to [name] with a Right MCA Infarct with Lft Hemiparesis, and severe oropharyngeal dysphagia, and severe dysarthria.    Patient Stated Goals  Patient would like to be as independent as possible.     Currently in Pain?  No/denies      OT TREATMENT    Neuro muscular re-education:  Pt. worked on [name] skills grasping, sorting, and disconnecting small magnetic  objects and placing them on a vertical whiteboard. Pt. required verbal cues to sort the pegs by color.   Selfcare:  Pt. Worked on visual scanning tasks, and visual search strategies in preparation for ADLs, and IADLs. Pt. worked on single [employer], with no omissions, and increased time to complete. Pt. completed structured, and random complex circle searches. Pt. had 3 omissions to the right on the structured circle search, and 9 omissions to the left, and [name] for the random circles search. Pt. was unable to complete the word seach. Pt. utilized horiizontal, vertical, horizontal rectilinear, and vertical rectilinear visual search patterns for each.                           OT Education - 12/19/17 1652    Education provided  Yes    Education Details  fine motor coordination skills, UE strengthening    Person(s) Educated  Patient    Methods  Explanation;Demonstration    Comprehension  Verbalized understanding;Returned demonstration;Verbal cues required          OT Long Term Goals - 11/22/17 1620      OT LONG TERM GOAL #1   Title  Pt. will demonstrate independence with visual compensatory strategies during ADL, and IADL tasks.  Baseline  11/22/2017: Impaired visual scanning, and visual search strategies.     Time  12    Period  Weeks    Status  On-going    Target Date  11/22/17      OT LONG TERM GOAL #2   Title  Pt. will increase Left grip strength will increase by 10# to improve ADL/IADL functioning.    Baseline  10/18/2017: Left grip strength is improving    Time  12    Period  Weeks    Status  On-going    Target Date  02/14/18      OT LONG TERM GOAL #3   Title  Pt. will improve FMC skills by 5 sec. of speed to be able to manipulate ADL/IADL objects.    Baseline  11/22/2017: Pt. is progressing with speed, dexterity, and [name] skills while manipulating small objects.    Time  12    Period  Weeks    Status  On-going    Target Date  02/14/18       OT LONG TERM GOAL #4   Title  Pt. will accurately identify 10/10 home safety hazards for ADLs, and IADLs    Baseline  Continue with this goal.    Time  12    Period  Weeks    Status  On-going    Target Date  02/14/18            Plan - 12/19/17 1652    Clinical Impression Statement  Pt. conitnues to work on visual scanningtasks, and visual search strategies. Pt. worked on single [employer], with no omissions, and increased time to complete. Pt. completed structured, and random complex circle searches. Pt. had 3 omissions to the right on the structured circle search, and 9 omissions to the left, and [name] for the random circles search. Pt. was unable to complete the word seach. Pt. utilized horiizontal, vertical, horizontal rectilinear, and vertical rectilinear visual search patterns for each. Pt. continues to work on visual scanning, and visual search strategies during ADL, and IADL functioning.    Occupational Profile and client history currently impacting functional performance  Pt. resides with his daughter.    Occupational performance deficits (Please refer to evaluation for details):  ADL's;IADL's    Rehab Potential  Poor    Current Impairments/barriers affecting progress:  Positive Indicators: age, family support, Negative Indicators: multiple comorbidities    OT Frequency  2x / week    OT Duration  12 weeks    OT Treatment/Interventions  Self-care/ADL training;Therapeutic exercise;Therapeutic activities;Neuromuscular education;DME and/or AE instruction;Patient/family education;Energy conservation    Clinical Decision Making  Several treatment options, min-mod task modification necessary    Consulted and Agree with Plan of Care  Patient       Patient will benefit from skilled therapeutic intervention in order to improve the following deficits and impairments:  Impaired UE functional use, Decreased activity tolerance, Decreased endurance, Decreased coordination, Impaired  vision/preception, Decreased cognition, Decreased strength  Visit Diagnosis:    Problem List Patient Active Problem List   Diagnosis Date Noted  . PEG (percutaneous endoscopic gastrostomy) status (HCC)   . Benign essential HTN   . Hypertensive crisis   . Dysarthria, post-stroke   . Acute pain of right shoulder   . 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 Dysphagia, oropharyngeal 06/23/2017  . Respiratory failure (H[facility])   . Palliative care encounter   . Aspiration pneumonia due to gastric secretions (HCC)   . Atrial fibrillation (HCC)   .  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    Olegario Messier, MS, OTR/L 12/19/2017, 5:08 PM  [name] [name] SERVICES [address] [place], [place], [address] Phone: [phone]   Fax:  [phone]  Name: BRAIDYN ROTMAN MRN: [id number] Date of Birth: 01-06-1941

## 2017-12-20 ENCOUNTER — Encounter: Payer: Self-pay | Admitting: Speech Pathology

## 2017-12-20 NOTE — Therapy (Signed)
[name] Long Term Goals - 12/13/17 1126      [name] LONG TERM GOAL #1   Title  Pt will improve speech intelligibility for words and phrases by controlling rate of speech, over-articulation, and increased loudness to achieve 80% intelligibility with min. [name] cues.    Time  4    Period  Weeks    Status  Partially Met    Target Date  01/13/18      [name] LONG TERM GOAL #2   Title  Patient will execute Swallowing and Voice Building HEP 5-7 days per week, with assistance from family as needed.    Time  4    Period  Weeks    Status  Partially Met    Target Date  01/13/18      [name] LONG TERM GOAL #4   Title  Patient will consume 4 oz thin liquid with no more than 1 overt  sign/symptom of aspiration.     Time  4    Status  Partially Met    Target Date  01/13/18       Plan - 12/20/17 0948    Clinical Impression Statement  The patient demonstrates improving vocal strength and endurance.  He continues to cough/throat clear with liquids, but is consistently able to generate clear, strong vocal quality after cough indicating clearing of residue.  The patient has coughing, but he has COPD/emphysema which complicates the clinical picture.  The patient is now taking in sufficient fluid to meet his needs and will have his PEG removed soon.      Speech Therapy Frequency  2x / week    Duration  4 weeks    Treatment/Interventions  Pharyngeal strengthening exercises;Diet toleration management by [name];Oral motor exercises;Compensatory strategies;[name] instruction and feedback;Patient/family education    Potential to Achieve Goals  Good    Potential Considerations  Ability to learn/carryover information;Pain level;Family/community support;Co-morbidities;Previous level of function;Cooperation/participation level;Severity of impairments;Medical prognosis    [name] Home Exercise Plan  Respiratory support activities, voice activities, oral intake log, swallowing recommendations, and things to observe for.      Consulted and Agree with Plan of Care  Patient       Patient will benefit from skilled therapeutic intervention in order to improve the following deficits and impairments:   Oropharyngeal dysphagia  Dysarthria and anarthria    Problem List Patient Active Problem List   Diagnosis Date Noted  . PEG (percutaneous endoscopic gastrostomy) status ([place])   . Benign essential HTN   . Hypertensive crisis   . Dysarthria, post-stroke   . Acute pain of right shoulder   . 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 Dysphagia, oropharyngeal 06/23/2017  . Respiratory failure ([place])   . Palliative care encounter   . Aspiration pneumonia due to gastric secretions ([place])   . Atrial  fibrillation ([place])   . 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 [place], [place], [address] Phone: [phone]   Fax:  [phone]   Name: [name] MRN: [id number] Date of Birth: 22-Mar-1941  [place] MAIN [facility] SERVICES [address] [place], [place], [address] Phone: [phone]   Fax:  [phone]  Speech Language Pathology Treatment  Patient Details  Name: [name] MRN: [id number] Date of Birth: 28-May-1940 Referring Provider: Charlett Blake   Encounter Date: 12/19/2017  End of Session - 12/20/17 0947    Visit Number  30    Number of Visits  37    Date for [name] Re-Evaluation  01/13/18    [name] Start Time  1500    [name] Stop Time   1550    [name] Time Calculation (min)  50 min    Activity Tolerance  Patient tolerated treatment well       Past Medical History:  Diagnosis Date  . COPD (chronic obstructive pulmonary disease) ([place])   . Emphysema lung ([place])   . Hx of long term use of blood thinners   . Hypertension   . Stroke [facility]) 2013  . Thyroid disease    hypothyroidism    Past Surgical History:  Procedure Laterality Date  . IR ANGIO EXTRACRAN SEL COM CAROTID INNOMINATE UNI R MOD SED  06/19/2017  . IR ANGIO VERTEBRAL SEL SUBCLAVIAN INNOMINATE UNI R MOD SED  06/19/2017  . IR GASTROSTOMY TUBE MOD SED  07/04/2017  . RADIOLOGY WITH ANESTHESIA N/A 06/19/2017   Procedure: RADIOLOGY WITH ANESTHESIA;  Surgeon: Luanne Bras, MD;  Location: [facility];  Service: Radiology;  Laterality: N/A;    There were no vitals filed for this visit.  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  The patient received inpatient rehab services 06/23/2017 - 07/04/2017 and home health services on discharge.         Treatment Provided   Treatment provided   Cognitive-Linquistic      Cognitive-Linquistic Treatment   Treatment focused on  Dysarthria;Apraxia;Voice;Other (comment);Patient/family/caregiver education   Dysphagia   Skilled Treatment  SWALLOWING:   Patient returned with completed log. Patient will have his PEG removed soon.  The patient drank 8 ounces of water, with cough or throat clear most trials.  When he is able to attend and follow the strategy of 2 swallows per bolus, he is less likely to cough. Patient consistently able to generate clear, strong vocal quality after cough indicating clearing of residue.  PHONATION: Patient is able to generate a loud h+vowel.   Loudly recite automatic speech series with 100% accuracy.  Patient able to imitate words/phrases, maintaining loudness for all words with 85% accuracy.        Assessment / Recommendations / Plan   Plan  Continue with current plan of care      Progression Toward Goals   Progression toward goals  Progressing toward goals       [name] Education - 12/20/17 0947    Education provided  Yes    Education Details  Dont' drink when you are coughing- wait until the cough is calmed    Person(s) Educated  Patient    Methods  Explanation    Comprehension  Verbalized understanding;Need further instruction

## 2017-12-21 ENCOUNTER — Encounter: Payer: Self-pay | Admitting: Occupational Therapy

## 2017-12-21 ENCOUNTER — Ambulatory Visit: Payer: Medicare Other | Admitting: Speech Pathology

## 2017-12-21 ENCOUNTER — Encounter: Payer: Self-pay | Admitting: Speech Pathology

## 2017-12-21 ENCOUNTER — Ambulatory Visit: Payer: Medicare Other | Admitting: Occupational Therapy

## 2017-12-21 DIAGNOSIS — M6281 Muscle weakness (generalized): Secondary | ICD-10-CM

## 2017-12-21 DIAGNOSIS — R471 Dysarthria and anarthria: Secondary | ICD-10-CM

## 2017-12-21 DIAGNOSIS — H543 Unqualified visual loss, both eyes: Secondary | ICD-10-CM | POA: Diagnosis not present

## 2017-12-21 DIAGNOSIS — R41841 Cognitive communication deficit: Secondary | ICD-10-CM | POA: Diagnosis not present

## 2017-12-21 DIAGNOSIS — R1312 Dysphagia, oropharyngeal phase: Secondary | ICD-10-CM | POA: Diagnosis not present

## 2017-12-21 DIAGNOSIS — R278 Other lack of coordination: Secondary | ICD-10-CM | POA: Diagnosis not present

## 2017-12-21 NOTE — Therapy (Signed)
[facility] MAIN [facility] SERVICES [address] [place], [place], [address] Phone: [phone]   Fax:  [phone]  Speech Language Pathology Treatment  Patient Details  Name: Ronald [name] MRN: [id number] Date of Birth: [birth year] Referring Provider: [name]   Encounter Date: 12/21/2017  End of Session - 12/21/17 1606    Visit Number  31    Number of Visits  37    Date for SLP Re-Evaluation  01/13/18    SLP Start Time  1505    SLP Stop Time   1556    SLP Time Calculation (min)  51 min    Activity Tolerance  Patient tolerated treatment well       Past Medical History:  Diagnosis Date  . COPD (chronic obstructive pulmonary disease) (HCC)   . Emphysema lung (HCC)   . Hx of long term use of blood thinners   . Hypertension   . Stroke [facility]) 2013  . Thyroid disease    hypothyroidism    Past Surgical History:  Procedure Laterality Date  . IR ANGIO EXTRACRAN SEL COM CAROTID INNOMINATE UNI R MOD SED  06/19/2017  . IR ANGIO VERTEBRAL SEL SUBCLAVIAN INNOMINATE UNI R MOD SED  06/19/2017  . IR GASTROSTOMY TUBE MOD SED  07/04/2017  . RADIOLOGY WITH ANESTHESIA N/A 06/19/2017   Procedure: RADIOLOGY WITH ANESTHESIA;  Surgeon: Julieanne Cotton, MD;  Location: [facility] OR;  Service: Radiology;  Laterality: N/A;    There were no vitals filed for this visit.  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  The patient received inpatient rehab services 06/23/2017 - 07/04/2017 and home health services on discharge.         Treatment Provided   Treatment provided   Cognitive-Linquistic      Cognitive-Linquistic Treatment   Treatment focused on  Dysarthria;Apraxia;Voice;Other (comment);Patient/family/caregiver education   Dysphagia   Skilled Treatment  SWALLOWING:   Patient returned with completed log. Patient will have his PEG removed soon.  The patient drank 8 ounces of water, with cough or throat clear fewer trials.  When he is able to attend and follow the strategy of 2 swallows per bolus, he is less likely to cough. Patient consistently able to generate clear, strong vocal quality after cough indicating clearing of residue.  PHONATION: Patient is able to generate a loud h+vowel.   Loudly recite automatic speech series with 100% accuracy.  Patient able to imitate words/phrases, maintaining loudness for all words with 85% accuracy.        Assessment / Recommendations / Plan   Plan  Continue with current plan of care      Progression Toward Goals   Progression toward goals  Progressing toward goals       SLP Education - 12/21/17 1605    Education provided  Yes    Education Details  Cough is protective- don't supress a cough    Person(s) Educated  Patient    Methods  Explanation    Comprehension  Verbalized understanding;Need further instruction  SLP Long Term Goals - 12/13/17 1126      SLP LONG TERM GOAL #1   Title  Pt will improve speech intelligibility for words and phrases by controlling rate of speech, over-articulation, and increased loudness to achieve 80% intelligibility with min. SLP cues.    Time  4    Period  Weeks    Status  Partially Met    Target Date  01/13/18      SLP LONG TERM GOAL #2   Title  Patient will execute Swallowing and Voice Building HEP 5-7 days per week, with assistance from family as needed.    Time  4    Period  Weeks    Status  Partially Met    Target Date  01/13/18      SLP LONG TERM GOAL #4   Title  Patient will consume 4 oz thin liquid with no more than 1 overt sign/symptom of aspiration.      Time  4    Status  Partially Met    Target Date  01/13/18       Plan - 12/21/17 1606    Clinical Impression Statement  The patient demonstrates improving vocal strength and endurance.  He continues to cough/throat clear with liquids, but is consistently able to generate clear, strong vocal quality after cough indicating clearing of residue.  The patient has coughing, but he has COPD/emphysema which complicates the clinical picture.  The patient is now taking in sufficient fluid to meet his needs and will have his PEG removed soon.      Speech Therapy Frequency  2x / week    Duration  4 weeks    Treatment/Interventions  Pharyngeal strengthening exercises;Diet toleration management by SLP;Oral motor exercises;Compensatory strategies;SLP instruction and feedback;Patient/family education    Potential to Achieve Goals  Good    Potential Considerations  Ability to learn/carryover information;Pain level;Family/community support;Co-morbidities;Previous level of function;Cooperation/participation level;Severity of impairments;Medical prognosis    SLP Home Exercise Plan  Respiratory support activities, voice activities, oral intake log, swallowing recommendations, and things to observe for.      Consulted and Agree with Plan of Care  Patient       Patient will benefit from skilled therapeutic intervention in order to improve the following deficits and impairments:   Dysarthria and anarthria  Oropharyngeal dysphagia    Problem List Patient Active Problem List   Diagnosis Date Noted  . PEG (percutaneous endoscopic gastrostomy) status (HCC)   . Benign essential HTN   . Hypertensive crisis   . Dysarthria, post-stroke   . Acute pain of right shoulder   . 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 Dysphagia, oropharyngeal 06/23/2017  . Respiratory failure (HCC)   . Palliative care encounter   . Aspiration pneumonia due to gastric secretions (HCC)   . Atrial fibrillation (HCC)   . Stroke  (cerebrum) (HCC) 06/19/2017   [name], [name] SLP  [name] 12/21/2017, 4:07 PM  [facility] MAIN [facility] SERVICES [address] [place], [place], [address] Phone: [phone]   Fax:  [phone]   Name: Ronald [name] MRN: [id number] Date of Birth: 1941-03-28

## 2017-12-21 NOTE — Therapy (Signed)
[facility] MAIN [facility] SERVICES [address] [place], [place], [address] Phone: [phone]   Fax:  [phone]  Occupational Therapy Treatment  Patient Details  Name: Ronald [name] MRN: [id number] Date of Birth: [birth year] Referring Provider: Dr. Wynn Banker   Encounter Date: 12/21/2017  OT End of Session - 12/21/17 1628    Visit Number  22    Number of Visits  24    Date for OT Re-Evaluation  02/14/18    Authorization Type  Visit 5 of 10 for progress report period starting 11/22/2017    OT Start Time  1600    OT Stop Time  1645    OT Time Calculation (min)  45 min    Activity Tolerance  Patient tolerated treatment well    Behavior During Therapy  [facility] for tasks assessed/performed       Past Medical History:  Diagnosis Date  . COPD (chronic obstructive pulmonary disease) (HCC)   . Emphysema lung (HCC)   . Hx of long term use of blood thinners   . Hypertension   . Stroke [facility]) 2013  . Thyroid disease    hypothyroidism    Past Surgical History:  Procedure Laterality Date  . IR ANGIO EXTRACRAN SEL COM CAROTID INNOMINATE UNI R MOD SED  06/19/2017  . IR ANGIO VERTEBRAL SEL SUBCLAVIAN INNOMINATE UNI R MOD SED  06/19/2017  . IR GASTROSTOMY TUBE MOD SED  07/04/2017  . RADIOLOGY WITH ANESTHESIA N/A 06/19/2017   Procedure: RADIOLOGY WITH ANESTHESIA;  Surgeon: Julieanne Cotton, MD;  Location: [facility] OR;  Service: Radiology;  Laterality: N/A;    There were no vitals filed for this visit.  Subjective Assessment - 12/21/17 1627    Subjective   Pt nodded to indicate his day was going well.    Pertinent History  Pt. is a 77 y.o. male who was admitted to [facility] with a Right MCA Infarct with Lft Hemiparesis, and severe oropharyngeal dysphagia, and severe dysarthria.    Patient Stated Goals  Patient would like to be as independent as possible.     Currently in Pain?  No/denies     OT TREATMENT    Therapeutic Activities:  Pt. worked on following  simple design patterns using resistive clips.  Pt. worked on sorting cards by suit, as well as by number. Pt. required consistent, and increased cues initially As the task progressed pt. required fewer cues.  Therapeutic Ex.  Pt. worked on the [employer] for 8 min. With constant monitoring of the BUEs. Pt. worked on changing, and alternating forward reverse position every 2 min. rest breaks were required.                          OT Education - 12/21/17 1627    Education provided  Yes    Education Details  fine motor coordination skills, UE strengthening, following pattern designs    Person(s) Educated  Patient    Methods  Explanation;Demonstration    Comprehension  Verbalized understanding;Returned demonstration;Verbal cues required          OT Long Term Goals - 11/22/17 1620      OT LONG TERM GOAL #1   Title  Pt. will demonstrate independence with visual compensatory strategies during ADL, and IADL tasks.    Baseline  11/22/2017: Impaired visual scanning, and visual search strategies.     Time  12    Period  Weeks    Status  On-going    Target Date  11/22/17      OT LONG TERM GOAL #2   Title  Pt. will increase Left grip strength will increase by 10# to improve ADL/IADL functioning.    Baseline  10/18/2017: Left grip strength is improving    Time  12    Period  Weeks    Status  On-going    Target Date  02/14/18      OT LONG TERM GOAL #3   Title  Pt. will improve [facility] skills by 5 sec. of speed to be able to manipulate ADL/IADL objects.    Baseline  11/22/2017: Pt. is progressing with speed, dexterity, and [facility] skills while manipulating small objects.    Time  12    Period  Weeks    Status  On-going    Target Date  02/14/18      OT LONG TERM GOAL #4   Title  Pt. will accurately identify 10/10 home safety hazards for ADLs, and IADLs    Baseline  Continue with this goal.    Time  12    Period  Weeks    Status  On-going    Target Date  02/14/18             Plan - 12/21/17 1628    Clinical Impression Statement  Pt.  worked on following simple patterns with the resisitive clips with verbal, and visual cues. Pt. requires assist initially and verbal cues to sort cards by suit, as well as sorting/matching numbers. Pt. continues to work on improving overall ADlL,a nd IADL functioning, as well as UE functioning.     Occupational Profile and client history currently impacting functional performance  Pt. resides with his daughter.    Occupational performance deficits (Please refer to evaluation for details):  ADL's;IADL's    Rehab Potential  Poor    Current Impairments/barriers affecting progress:  Positive Indicators: age, family support, Negative Indicators: multiple comorbidities    OT Frequency  2x / week    OT Duration  12 weeks    OT Treatment/Interventions  Self-care/ADL training;Therapeutic exercise;Therapeutic activities;Neuromuscular education;DME and/or AE instruction;Patient/family education;Energy conservation    Clinical Decision Making  Several treatment options, min-mod task modification necessary    Consulted and Agree with Plan of Care  Patient       Patient will benefit from skilled therapeutic intervention in order to improve the following deficits and impairments:  Impaired UE functional use, Decreased activity tolerance, Decreased endurance, Decreased coordination, Impaired vision/preception, Decreased cognition, Decreased strength  Visit Diagnosis: Muscle weakness (generalized)  Cognitive communication deficit    Problem List Patient Active Problem List   Diagnosis Date Noted  . PEG (percutaneous endoscopic gastrostomy) status (HCC)   . Benign essential HTN   . Hypertensive crisis   . Dysarthria, post-stroke   . Acute pain of right shoulder   . 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 Dysphagia, oropharyngeal 06/23/2017  . Respiratory failure (HCC)   . Palliative care encounter   . Aspiration  pneumonia due to gastric secretions (HCC)   . Atrial fibrillation (HCC)   . 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    Ronald Messier, MS, OTR/L 12/21/2017, 4:41 PM  [facility] MAIN [facility] SERVICES [address] [place], [place], [address] Phone: [phone]   Fax:  [phone]  Name: TYROME WHITEN MRN: [id number] Date of Birth: 1940-08-04

## 2017-12-26 ENCOUNTER — Encounter: Payer: Self-pay | Admitting: Occupational Therapy

## 2017-12-26 ENCOUNTER — Ambulatory Visit: Payer: Medicare Other | Admitting: Occupational Therapy

## 2017-12-26 ENCOUNTER — Ambulatory Visit: Payer: Medicare Other | Admitting: Speech Pathology

## 2017-12-26 ENCOUNTER — Encounter: Payer: Self-pay | Admitting: Speech Pathology

## 2017-12-26 DIAGNOSIS — R471 Dysarthria and anarthria: Secondary | ICD-10-CM

## 2017-12-26 DIAGNOSIS — H543 Unqualified visual loss, both eyes: Secondary | ICD-10-CM | POA: Diagnosis not present

## 2017-12-26 DIAGNOSIS — R41841 Cognitive communication deficit: Secondary | ICD-10-CM | POA: Diagnosis not present

## 2017-12-26 DIAGNOSIS — R278 Other lack of coordination: Secondary | ICD-10-CM

## 2017-12-26 DIAGNOSIS — R1312 Dysphagia, oropharyngeal phase: Secondary | ICD-10-CM | POA: Diagnosis not present

## 2017-12-26 DIAGNOSIS — M6281 Muscle weakness (generalized): Secondary | ICD-10-CM | POA: Diagnosis not present

## 2017-12-26 NOTE — Therapy (Signed)
HCC) 06/19/2017    Olegario Messier, MS, OTR/L 12/26/2017, 4:30 PM  [facility] MAIN [facility] SERVICES [address] [place], [place], [address] Phone: [phone]   Fax:  [phone]  Name: Ronald [name] MRN: [id number] Date of Birth: 27-Aug-1940  [facility] MAIN [facility] SERVICES [address] [place], [place], [address] Phone: [phone]   Fax:  [phone]  Occupational Therapy Treatment  Patient Details  Name: Ronald [name] MRN: [id number] Date of Birth: [birth year] Referring Provider: Dr. Wynn Banker   Encounter Date: 12/26/2017  OT End of Session - 12/26/17 1622    Visit Number  23    Number of Visits  48    Date for OT Re-Evaluation  02/14/18    Authorization Type  Visit 6 of 10 for progress report period starting 11/22/2017    OT Start Time  1600    OT Stop Time  1645    OT Time Calculation (min)  45 min    Activity Tolerance  Patient tolerated treatment well    Behavior During Therapy  [facility] for tasks assessed/performed       Past Medical History:  Diagnosis Date  . COPD (chronic obstructive pulmonary disease) (HCC)   . Emphysema lung (HCC)   . Hx of long term use of blood thinners   . Hypertension   . Stroke [facility]) 2013  . Thyroid disease    hypothyroidism    Past Surgical History:  Procedure Laterality Date  . IR ANGIO EXTRACRAN SEL COM CAROTID INNOMINATE UNI R MOD SED  06/19/2017  . IR ANGIO VERTEBRAL SEL SUBCLAVIAN INNOMINATE UNI R MOD SED  06/19/2017  . IR GASTROSTOMY TUBE MOD SED  07/04/2017  . RADIOLOGY WITH ANESTHESIA N/A 06/19/2017   Procedure: RADIOLOGY WITH ANESTHESIA;  Surgeon: Julieanne Cotton, MD;  Location: [facility] OR;  Service: Radiology;  Laterality: N/A;    There were no vitals filed for this visit.  Subjective Assessment - 12/26/17 1621    Subjective   Pt nodded to indicate his day was doing well today.    Pertinent History  Pt. is a 77 y.o. male who was admitted to [facility] with a Right MCA Infarct with Lft Hemiparesis, and severe oropharyngeal dysphagia, and severe dysarthria.    Patient Stated Goals  Patient would like to be as independent as possible.     Currently in Pain?  No/denies      OT TREATMENT    Neuro muscular re-education:   Pt.  performed [facility] skills training to improve speed and dexterity needed for ADL tasks. Pt. demonstrated grasping 1 inch sticks,  inch cylindrical collars, and  inch flat washers on the [employer]urdue pegboard, and building a 3 piece tower. Pt. worked on bilateral [facility] skills needed to grasp small resistive beads. Pt. worked on connecting the beads using a 3pt. pinch, and pt. Pinch grasp. Pt. worked on disconnecting the resistive beads using a lateral pinch grasp, and 3pt. pinch grasp.   Therapeutic Exercise:  Pt. performed 2# dowel ex. for UE strengthening secondary to weakness. Bilateral shoulder flexion, chest press, circular patterns, and elbow flexion/extension were performed.Pt. performed 2 set of 10-20 reps each. visual demonstration, and cues for for the proper movement patterns, and to slow down.  Cognitive Development:  Pt. Worked on sorting cards by card number, followed by suit.                        OT Education - 12/26/17 1622    Education provided  Yes    Education Details  fine motor coordination skills, UE strengthening, following pattern designs    Person(s) Educated  Patient    Methods  Explanation;Demonstration    Comprehension  Verbalized understanding;Returned demonstration;Verbal cues  [facility] MAIN [facility] SERVICES [address] [place], [place], [address] Phone: [phone]   Fax:  [phone]  Occupational Therapy Treatment  Patient Details  Name: Ronald [name] MRN: [id number] Date of Birth: [birth year] Referring Provider: Dr. Wynn Banker   Encounter Date: 12/26/2017  OT End of Session - 12/26/17 1622    Visit Number  23    Number of Visits  48    Date for OT Re-Evaluation  02/14/18    Authorization Type  Visit 6 of 10 for progress report period starting 11/22/2017    OT Start Time  1600    OT Stop Time  1645    OT Time Calculation (min)  45 min    Activity Tolerance  Patient tolerated treatment well    Behavior During Therapy  [facility] for tasks assessed/performed       Past Medical History:  Diagnosis Date  . COPD (chronic obstructive pulmonary disease) (HCC)   . Emphysema lung (HCC)   . Hx of long term use of blood thinners   . Hypertension   . Stroke [facility]) 2013  . Thyroid disease    hypothyroidism    Past Surgical History:  Procedure Laterality Date  . IR ANGIO EXTRACRAN SEL COM CAROTID INNOMINATE UNI R MOD SED  06/19/2017  . IR ANGIO VERTEBRAL SEL SUBCLAVIAN INNOMINATE UNI R MOD SED  06/19/2017  . IR GASTROSTOMY TUBE MOD SED  07/04/2017  . RADIOLOGY WITH ANESTHESIA N/A 06/19/2017   Procedure: RADIOLOGY WITH ANESTHESIA;  Surgeon: Julieanne Cotton, MD;  Location: [facility] OR;  Service: Radiology;  Laterality: N/A;    There were no vitals filed for this visit.  Subjective Assessment - 12/26/17 1621    Subjective   Pt nodded to indicate his day was doing well today.    Pertinent History  Pt. is a 77 y.o. male who was admitted to [facility] with a Right MCA Infarct with Lft Hemiparesis, and severe oropharyngeal dysphagia, and severe dysarthria.    Patient Stated Goals  Patient would like to be as independent as possible.     Currently in Pain?  No/denies      OT TREATMENT    Neuro muscular re-education:   Pt.  performed [facility] skills training to improve speed and dexterity needed for ADL tasks. Pt. demonstrated grasping 1 inch sticks,  inch cylindrical collars, and  inch flat washers on the [employer] pegboard, and building a 3 piece tower. Pt. worked on bilateral [facility] skills needed to grasp small resistive beads. Pt. worked on connecting the beads using a 3pt. pinch, and pt. Pinch grasp. Pt. worked on disconnecting the resistive beads using a lateral pinch grasp, and 3pt. pinch grasp.   Therapeutic Exercise:  Pt. performed 2# dowel ex. for UE strengthening secondary to weakness. Bilateral shoulder flexion, chest press, circular patterns, and elbow flexion/extension were performed.Pt. performed 2 set of 10-20 reps each. visual demonstration, and cues for for the proper movement patterns, and to slow down.  Cognitive Development:  Pt. Worked on sorting cards by card number, followed by suit.                        OT Education - 12/26/17 1622    Education provided  Yes    Education Details  fine motor coordination skills, UE strengthening, following pattern designs    Person(s) Educated  Patient    Methods  Explanation;Demonstration    Comprehension  Verbalized understanding;Returned demonstration;Verbal cues

## 2017-12-26 NOTE — Therapy (Signed)
SLP Long Term Goals - 12/13/17 1126      SLP LONG TERM GOAL #1   Title  Pt will improve speech intelligibility for words and phrases by controlling rate of speech, over-articulation, and increased loudness to achieve 80% intelligibility with min. SLP cues.    Time  4    Period  Weeks    Status  Partially Met    Target Date  01/13/18      SLP LONG TERM GOAL #2   Title  Patient will execute Swallowing and Voice Building HEP 5-7 days per week, with assistance from family as needed.    Time  4    Period  Weeks    Status  Partially Met    Target Date  01/13/18      SLP LONG TERM GOAL #4   Title  Patient will consume 4 oz thin liquid with no more than 1 overt sign/symptom of aspiration.     Time  4     Status  Partially Met    Target Date  01/13/18       Plan - 12/26/17 1601    Clinical Impression Statement  The patient demonstrates improving vocal strength and endurance.  He continues to cough/throat clear with liquids, but is consistently able to generate clear, strong vocal quality after cough indicating clearing of residue.  The patient has coughing, but he has COPD/emphysema which complicates the clinical picture.  The patient is now taking in sufficient fluid to meet his needs and will have his PEG removed soon.         Patient will benefit from skilled therapeutic intervention in order to improve the following deficits and impairments:   Dysarthria and anarthria  Dysphagia, oropharyngeal    Problem List Patient Active Problem List   Diagnosis Date Noted  . PEG (percutaneous endoscopic gastrostomy) status ([place])   . Benign essential HTN   . Hypertensive crisis   . Dysarthria, post-stroke   . Acute pain of right shoulder   . 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 Dysphagia, oropharyngeal 06/23/2017  . Respiratory failure ([place])   . Palliative care encounter   . Aspiration pneumonia due to gastric secretions ([place])   . Atrial fibrillation ([place])   . Stroke (cerebrum) ([place]) 06/19/2017   Ronald Sea, [name] SLP  Lou Miner 12/26/2017, 4:02 PM  [place] MAIN [facility] SERVICES [address] [place], [place], [address] Phone: [phone]   Fax:  [phone]   Name: Ronald [name] MRN: [id number] Date of Birth: 29-Oct-1940  [place] MAIN [facility] SERVICES [address] [place], [place], [address] Phone: [phone]   Fax:  [phone]  Speech Language Pathology Treatment  Patient Details  Name: Ronald [name] MRN: [id number] Date of Birth: [birth year] Referring Provider: Charlett Blake   Encounter Date: 12/26/2017  End of Session - 12/26/17 1601    Visit Number  32    Number of Visits  37    Date for SLP Re-Evaluation  01/13/18    SLP Start Time  53    SLP Stop Time   1555    SLP Time Calculation (min)  48 min    Activity Tolerance  Patient tolerated treatment well       Past Medical History:  Diagnosis Date  . COPD (chronic obstructive pulmonary disease) ([place])   . Emphysema lung ([place])   . Hx of long term use of blood thinners   . Hypertension   . Stroke [facility]) 2013  . Thyroid disease    hypothyroidism    Past Surgical History:  Procedure Laterality Date  . IR ANGIO EXTRACRAN SEL COM CAROTID INNOMINATE UNI R MOD SED  06/19/2017  . IR ANGIO VERTEBRAL SEL SUBCLAVIAN INNOMINATE UNI R MOD SED  06/19/2017  . IR GASTROSTOMY TUBE MOD SED  07/04/2017  . RADIOLOGY WITH ANESTHESIA N/A 06/19/2017   Procedure: RADIOLOGY WITH ANESTHESIA;  Surgeon: Luanne Bras, MD;  Location: [place];  Service: Radiology;  Laterality: N/A;    There were no vitals filed for this visit.  Subjective Assessment - 12/26/17 1600    Subjective  Patient reports that he will get his PEG removed soon            ADULT SLP TREATMENT - 12/26/17 0001      General Information   Behavior/Cognition  Alert;Cooperative;Pleasant mood;Distractible    HPI  77 year old man, with history of prior stroke with speech and language deficits, with right hemisphere CVA 06/19/2017 with profound dysarthria and severe oropharyngeal dysphagia.  The patient received inpatient rehab services 06/23/2017 - 07/04/2017 and home health services on discharge.         Treatment Provided   Treatment provided   Cognitive-Linquistic;Dysphagia      Cognitive-Linquistic Treatment   Treatment focused on  Dysarthria;Apraxia;Voice;Other (comment);Patient/family/caregiver education   Dysphagia   Skilled Treatment  SWALLOWING:   Patient returned with completed log. Patient will have his PEG removed soon.  The patient drank 8 ounces of water, with cough or throat clear fewer trials.  When he is able to attend and follow the strategy of 2 swallows per bolus, he is less likely to cough. Patient consistently able to generate clear, strong vocal quality after cough indicating clearing of residue.  PHONATION: Patient is able to generate a loud h+vowel.   Loudly recite automatic speech series with 100% accuracy.  Patient able to imitate words/phrases, maintaining loudness for all words with 85% accuracy.        Assessment / Recommendations / Plan   Plan  Continue with current plan of care      Progression Toward Goals   Progression toward goals  Progressing toward goals       SLP Education - 12/26/17 1600    Education provided  Yes    Education Details  Swallow twice each time     Person(s) Educated  Patient    Methods  Explanation    Comprehension  Verbalized understanding;Need further instruction

## 2017-12-28 ENCOUNTER — Encounter: Payer: Self-pay | Admitting: Occupational Therapy

## 2017-12-28 ENCOUNTER — Ambulatory Visit: Payer: Medicare Other | Admitting: Occupational Therapy

## 2017-12-28 ENCOUNTER — Encounter: Payer: Self-pay | Admitting: Speech Pathology

## 2017-12-28 ENCOUNTER — Ambulatory Visit: Payer: Medicare Other | Admitting: Speech Pathology

## 2017-12-28 DIAGNOSIS — R471 Dysarthria and anarthria: Secondary | ICD-10-CM

## 2017-12-28 DIAGNOSIS — R41841 Cognitive communication deficit: Secondary | ICD-10-CM

## 2017-12-28 DIAGNOSIS — M6281 Muscle weakness (generalized): Secondary | ICD-10-CM

## 2017-12-28 DIAGNOSIS — H543 Unqualified visual loss, both eyes: Secondary | ICD-10-CM | POA: Diagnosis not present

## 2017-12-28 DIAGNOSIS — R1312 Dysphagia, oropharyngeal phase: Secondary | ICD-10-CM | POA: Diagnosis not present

## 2017-12-28 DIAGNOSIS — R278 Other lack of coordination: Secondary | ICD-10-CM | POA: Diagnosis not present

## 2017-12-28 NOTE — Therapy (Signed)
Dysarthria, post-stroke   . Acute pain of right shoulder   . 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 Dysphagia, oropharyngeal 06/23/2017  . Respiratory failure (HCC)   . Palliative care encounter   . Aspiration pneumonia due to gastric secretions (HCC)   . Atrial fibrillation (HCC)   . 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    Olegario MessierElaine Kiari Hosmer, MS, OTR/L 12/28/2017, 5:28 PM  [place] [facility] [address] [place], [place], [address] Phone: [phone]   Fax:  [phone]  Name: Ronald [name] MRN: [id number] Date of Birth: 1941-02-10  [facility] MAIN [facility] SERVICES [address] [place], [place], [address] Phone: [phone]   Fax:  [phone]  Occupational Therapy Treatment  Patient Details  Name: Ronald [name] MRN: [id number] Date of Birth: 1941/02/22 Referring Provider: Dr. Wynn Banker   Encounter Date: 12/28/2017  OT End of Session - 12/28/17 1611    Visit Number  24    Number of Visits  48    Date for OT Re-Evaluation  02/14/18    Authorization Type  Visit 7 of 10 for progress report period starting 11/22/2017    Activity Tolerance  Patient tolerated treatment well    Behavior During Therapy  [facility] for tasks assessed/performed       Past Medical History:  Diagnosis Date  . COPD (chronic obstructive pulmonary disease) (HCC)   . Emphysema lung (HCC)   . Hx of long term use of blood thinners   . Hypertension   . Stroke [facility]) 2013  . Thyroid disease    hypothyroidism    Past Surgical History:  Procedure Laterality Date  . IR ANGIO EXTRACRAN SEL COM CAROTID INNOMINATE UNI R MOD SED  06/19/2017  . IR ANGIO VERTEBRAL SEL SUBCLAVIAN INNOMINATE UNI R MOD SED  06/19/2017  . IR GASTROSTOMY TUBE MOD SED  07/04/2017  . RADIOLOGY WITH ANESTHESIA N/A 06/19/2017   Procedure: RADIOLOGY WITH ANESTHESIA;  Surgeon: Julieanne Cotton, MD;  [facility];  Service: Radiology;  Laterality: N/A;    There were no vitals filed for this visit.  Subjective Assessment - 12/28/17 1607    Subjective   Pt nodded to indicate his day was doing well today.    Pertinent History  Pt. is a 77 y.o. male who was admitted to [facility] with a Right MCA Infarct with Lft Hemiparesis, and severe oropharyngeal dysphagia, and severe dysarthria.    Patient Stated Goals  Patient would like to be as independent as possible.     Currently in Pain?  No/denies      OT TREATMENT    Therapeutic Exercise:  Pt. performed 2# dowel ex. for UE strengthening secondary to weakness. Bilateral shoulder flexion, chest  press, circular patterns, and elbow flexion/extension were performed. 3# dumbbell ex. for elbow flexion and extension,  2# for forearm supination/pronation, wrist flexion/extension, and radial deviation. Pt. requires rest breaks and verbal cues for proper technique.  Selfcare:  Pt. worked on scanning tasks to prepare for visual scanning and visual search patterns during ADLs, and IADLs. On the BiVABA structured complex circles search pt. presented with 7 ommissions on the left, and one misidentification on the right taking greater than 7 min. to complex. On the random complex circles search pt. had 11 ommissions (4 on the left, and 7 on the right). Pt. utilized horizontal, and vertical rectilinear visual search startegies for both tasks.   Cognition:  Pt. worked on Chiropractor patterns using the jumbo judy [employer]. Pt. required extensive cues initially, and required fewer cues as the task progressed. Increased time was required.                        OT Education - 12/28/17 1611    Education provided  Yes    Education Details  fine motor coordination skills, UE strengthening, following pattern designs    Person(s) Educated  Patient    Methods  Explanation;Demonstration    Comprehension  Verbalized understanding;Returned demonstration;Verbal cues required  [facility] MAIN [facility] SERVICES [address] [place], [place], [address] Phone: [phone]   Fax:  [phone]  Occupational Therapy Treatment  Patient Details  Name: Ronald [name] MRN: [id number] Date of Birth: 1941/02/22 Referring Provider: Dr. Wynn Banker   Encounter Date: 12/28/2017  OT End of Session - 12/28/17 1611    Visit Number  24    Number of Visits  48    Date for OT Re-Evaluation  02/14/18    Authorization Type  Visit 7 of 10 for progress report period starting 11/22/2017    Activity Tolerance  Patient tolerated treatment well    Behavior During Therapy  [facility] for tasks assessed/performed       Past Medical History:  Diagnosis Date  . COPD (chronic obstructive pulmonary disease) (HCC)   . Emphysema lung (HCC)   . Hx of long term use of blood thinners   . Hypertension   . Stroke [facility]) 2013  . Thyroid disease    hypothyroidism    Past Surgical History:  Procedure Laterality Date  . IR ANGIO EXTRACRAN SEL COM CAROTID INNOMINATE UNI R MOD SED  06/19/2017  . IR ANGIO VERTEBRAL SEL SUBCLAVIAN INNOMINATE UNI R MOD SED  06/19/2017  . IR GASTROSTOMY TUBE MOD SED  07/04/2017  . RADIOLOGY WITH ANESTHESIA N/A 06/19/2017   Procedure: RADIOLOGY WITH ANESTHESIA;  Surgeon: Julieanne Cotton, MD;  [facility];  Service: Radiology;  Laterality: N/A;    There were no vitals filed for this visit.  Subjective Assessment - 12/28/17 1607    Subjective   Pt nodded to indicate his day was doing well today.    Pertinent History  Pt. is a 77 y.o. male who was admitted to [facility] with a Right MCA Infarct with Lft Hemiparesis, and severe oropharyngeal dysphagia, and severe dysarthria.    Patient Stated Goals  Patient would like to be as independent as possible.     Currently in Pain?  No/denies      OT TREATMENT    Therapeutic Exercise:  Pt. performed 2# dowel ex. for UE strengthening secondary to weakness. Bilateral shoulder flexion, chest  press, circular patterns, and elbow flexion/extension were performed. 3# dumbbell ex. for elbow flexion and extension,  2# for forearm supination/pronation, wrist flexion/extension, and radial deviation. Pt. requires rest breaks and verbal cues for proper technique.  Selfcare:  Pt. worked on scanning tasks to prepare for visual scanning and visual search patterns during ADLs, and IADLs. On the BiVABA structured complex circles search pt. presented with 7 ommissions on the left, and one misidentification on the right taking greater than 7 min. to complex. On the random complex circles search pt. had 11 ommissions (4 on the left, and 7 on the right). Pt. utilized horizontal, and vertical rectilinear visual search startegies for both tasks.   Cognition:  Pt. worked on Chiropractor patterns using the jumbo judy [employer]. Pt. required extensive cues initially, and required fewer cues as the task progressed. Increased time was required.                        OT Education - 12/28/17 1611    Education provided  Yes    Education Details  fine motor coordination skills, UE strengthening, following pattern designs    Person(s) Educated  Patient    Methods  Explanation;Demonstration    Comprehension  Verbalized understanding;Returned demonstration;Verbal cues required

## 2017-12-28 NOTE — Therapy (Signed)
SLP Long Term Goals - 12/13/17 1126      SLP LONG TERM GOAL #1   Title  Pt will improve speech intelligibility for words and phrases by controlling rate of speech, over-articulation, and increased loudness to achieve 80% intelligibility with min. SLP cues.    Time  4    Period  Weeks    Status  Partially Met    Target Date  01/13/18      SLP LONG TERM GOAL #2   Title  Patient will execute Swallowing and Voice Building HEP 5-7 days per week, with assistance from family as needed.    Time  4    Period  Weeks    Status  Partially Met    Target Date  01/13/18      SLP LONG TERM GOAL #4   Title  Patient will consume 4 oz thin liquid with no more than 1 overt sign/symptom of aspiration.     Time  4    Status   Partially Met    Target Date  01/13/18       Plan - 12/28/17 1613    Clinical Impression Statement  The patient demonstrates improving vocal strength and endurance.  He continues to cough/throat clear with liquids, but is consistently able to generate clear, strong vocal quality after cough indicating clearing of residue.  The patient has coughing, but he has COPD/emphysema which complicates the clinical picture.  The patient is now taking in sufficient fluid to meet his needs and will have his PEG removed soon.      Speech Therapy Frequency  2x / week    Duration  4 weeks    Treatment/Interventions  Pharyngeal strengthening exercises;Diet toleration management by SLP;Oral motor exercises;Compensatory strategies;SLP instruction and feedback;Patient/family education    Potential to Achieve Goals  Good    Potential Considerations  Ability to learn/carryover information;Pain level;Family/community support;Co-morbidities;Previous level of function;Cooperation/participation level;Severity of impairments;Medical prognosis    SLP Home Exercise Plan  Respiratory support activities, voice activities, oral intake log, swallowing recommendations, and things to observe for.      Consulted and Agree with Plan of Care  Patient       Patient will benefit from skilled therapeutic intervention in order to improve the following deficits and impairments:   Dysarthria and anarthria  Dysphagia, oropharyngeal  Cognitive communication deficit    Problem List Patient Active Problem List   Diagnosis Date Noted  . PEG (percutaneous endoscopic gastrostomy) status ([place])   . Benign essential HTN   . Hypertensive crisis   . Dysarthria, post-stroke   . Acute pain of right shoulder   . 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 Dysphagia, oropharyngeal 06/23/2017  . Respiratory failure ([place])   . Palliative care encounter   . Aspiration pneumonia due to gastric secretions ([place])   . Atrial fibrillation ([place])    . 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 [place], [place], [address] Phone: [phone]   Fax:  [phone]   Name: Ronald [name] MRN: [id number] Date of Birth: 10-24-1940  [place] MAIN [facility] SERVICES [address] [place], [place], [address] Phone: [phone]   Fax:  [phone]  Speech Language Pathology Treatment  Patient Details  Name: Ronald [name] MRN: [id number] Date of Birth: 1940/05/05 Referring Provider: Charlett Blake   Encounter Date: 12/28/2017  End of Session - 12/28/17 1612    Visit Number  33    Number of Visits  37    Date for SLP Re-Evaluation  01/13/18    SLP Start Time  25    SLP Stop Time   1552    SLP Time Calculation (min)  47 min    Activity Tolerance  Patient tolerated treatment well       Past Medical History:  Diagnosis Date  . COPD (chronic obstructive pulmonary disease) ([place])   . Emphysema lung ([place])   . Hx of long term use of blood thinners   . Hypertension   . Stroke [facility]) 2013  . Thyroid disease    hypothyroidism    Past Surgical History:  Procedure Laterality Date  . IR ANGIO EXTRACRAN SEL COM CAROTID INNOMINATE UNI R MOD SED  06/19/2017  . IR ANGIO VERTEBRAL SEL SUBCLAVIAN INNOMINATE UNI R MOD SED  06/19/2017  . IR GASTROSTOMY TUBE MOD SED  07/04/2017  . RADIOLOGY WITH ANESTHESIA N/A 06/19/2017   Procedure: RADIOLOGY WITH ANESTHESIA;  Surgeon: Luanne Bras, MD;  Location: [place];  Service: Radiology;  Laterality: N/A;    There were no vitals filed for this visit.  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  The patient received inpatient rehab services 06/23/2017 - 07/04/2017 and home health services on discharge.         Treatment Provided   Treatment provided   Cognitive-Linquistic      Cognitive-Linquistic Treatment   Treatment focused on  Dysarthria;Apraxia;Voice;Other (comment);Patient/family/caregiver education   Dysphagia   Skilled Treatment  SWALLOWING:   Patient returned with completed log. Patient will have his PEG removed soon.  The patient drank 8 ounces of water, with cough or throat clear fewer trials.  When he is able to attend and follow the strategy of 2 swallows per bolus, he is less likely to cough. Patient consistently able to generate clear, strong vocal quality after cough indicating clearing of residue.  PHONATION: Patient is able to generate a loud h+vowel.   Loudly recite automatic speech series with 100% accuracy.  Patient able to imitate words/phrases, maintaining loudness for all words with 85% accuracy.        Assessment / Recommendations / Plan   Plan  Continue with current plan of care      Progression Toward Goals   Progression toward goals  Progressing toward goals       SLP Education - 12/28/17 1612    Education provided  Yes    Education Details  Cough is protective- cough if you need to    Person(s) Educated  Patient    Methods  Explanation    Comprehension  Verbalized understanding

## 2018-01-02 ENCOUNTER — Encounter: Payer: Self-pay | Admitting: Occupational Therapy

## 2018-01-02 ENCOUNTER — Ambulatory Visit: Payer: Medicare Other | Admitting: Speech Pathology

## 2018-01-02 ENCOUNTER — Ambulatory Visit: Payer: Medicare Other | Attending: Physical Medicine & Rehabilitation | Admitting: Occupational Therapy

## 2018-01-02 DIAGNOSIS — R41841 Cognitive communication deficit: Secondary | ICD-10-CM | POA: Diagnosis not present

## 2018-01-02 DIAGNOSIS — R278 Other lack of coordination: Secondary | ICD-10-CM | POA: Diagnosis not present

## 2018-01-02 DIAGNOSIS — R471 Dysarthria and anarthria: Secondary | ICD-10-CM | POA: Diagnosis not present

## 2018-01-02 DIAGNOSIS — M6281 Muscle weakness (generalized): Secondary | ICD-10-CM | POA: Insufficient documentation

## 2018-01-02 DIAGNOSIS — R1312 Dysphagia, oropharyngeal phase: Secondary | ICD-10-CM | POA: Diagnosis not present

## 2018-01-02 NOTE — Therapy (Signed)
Left grip strength will increase by 10# to improve ADL/IADL functioning.    Baseline  10/18/2017: Left grip strength is improving    Time  12    Period  Weeks    Status  On-going    Target Date  02/14/18      OT LONG TERM GOAL #3   Title  Pt. will improve FMC skills by 5 sec. of speed to be able to manipulate ADL/IADL objects.    Baseline  11/22/2017: Pt. is progressing with speed, dexterity, and [facility] skills while manipulating small objects.    Time  12    Period  Weeks    Status  On-going    Target Date  02/14/18      OT LONG TERM GOAL #4   Title  Pt. will accurately identify 10/10 home safety hazards for ADLs, and IADLs    Baseline  Continue with this goal.    Time  12    Period  Weeks    Status  On-going    Target Date  02/14/18            Plan - 01/02/18 1539    Clinical Impression Statement  Pt. required step-by step cues initially to follow design  patterns using a [employer]. Pt. required increased cues for copying more complex designs. Pt. requires extensive cues for redirection at times with both simple, and complex designs. pt. required icreased time to complete.    Occupational Profile and client history currently impacting functional performance  Pt. resides with his daughter.    Occupational performance deficits (Please refer to evaluation for details):  ADL's;IADL's    Rehab Potential  Poor    Current Impairments/barriers affecting progress:  Positive Indicators: age, family support, Negative Indicators: multiple comorbidities    OT Frequency  2x / week    OT Duration  12 weeks    OT Treatment/Interventions  Self-care/ADL training;Therapeutic exercise;Therapeutic activities;Neuromuscular education;DME and/or AE instruction;Patient/family education;Energy conservation    Clinical Decision Making  Several treatment options, min-mod task modification necessary    Consulted and Agree with Plan of Care  Patient       Patient will benefit from skilled therapeutic intervention in order to improve the following deficits and impairments:  Impaired UE functional use, Decreased activity tolerance, Decreased endurance, Decreased coordination, Impaired vision/preception, Decreased cognition, Decreased strength  Visit Diagnosis: Muscle weakness (generalized)  Other lack of coordination    Problem List Patient Active Problem List   Diagnosis Date Noted  . PEG (percutaneous endoscopic gastrostomy) status (HCC)   . Benign essential HTN   . Hypertensive crisis   . Dysarthria, post-stroke   . Acute pain of right shoulder   . 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 Dysphagia, oropharyngeal 06/23/2017  . Respiratory failure (HCC)   . Palliative care encounter   . Aspiration pneumonia due to gastric secretions (HCC)   . Atrial fibrillation (HCC)   . 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    Olegario Messier, MS,  OTR/L 01/02/2018, 3:56 PM  [place] [facility] MAIN [facility] SERVICES [address] [place], [place], [address] Phone: [phone]   Fax:  [phone]  Name: Ronald [name] MRN: [id number] Date of Birth: 12-21-1940  [place] [facility] MAIN [facility] SERVICES [address] [place], [place], [address] Phone: [phone]   Fax:  [phone]  Occupational Therapy Treatment  Patient Details  Name: Ronald [name] MRN: [id number] Date of Birth: 19-Dec-1940 Referring Provider: Dr. Wynn Banker   Encounter Date: 01/02/2018  OT End of Session - 01/02/18 1533    Visit Number  25    Number of Visits  48    Date for OT Re-Evaluation  02/14/18    Authorization Type  Visit 8 of 10 for progress report period starting 11/22/2017    OT Start Time  1515    OT Stop Time  1600    OT Time Calculation (min)  45 min    Activity Tolerance  Patient tolerated treatment well    Behavior During Therapy  [facility] for tasks assessed/performed       Past Medical History:  Diagnosis Date  . COPD (chronic obstructive pulmonary disease) (HCC)   . Emphysema lung (HCC)   . Hx of long term use of blood thinners   . Hypertension   . Stroke [facility]) 2013  . Thyroid disease    hypothyroidism    Past Surgical History:  Procedure Laterality Date  . IR ANGIO EXTRACRAN SEL COM CAROTID INNOMINATE UNI R MOD SED  06/19/2017  . IR ANGIO VERTEBRAL SEL SUBCLAVIAN INNOMINATE UNI R MOD SED  06/19/2017  . IR GASTROSTOMY TUBE MOD SED  07/04/2017  . RADIOLOGY WITH ANESTHESIA N/A 06/19/2017   Procedure: RADIOLOGY WITH ANESTHESIA;  Surgeon: Julieanne Cotton, MD;  Location: [facility] OR;  Service: Radiology;  Laterality: N/A;    There were no vitals filed for this visit.  Subjective Assessment - 01/02/18 1524    Subjective   Pt. continues to nod to indicate that he is doing well.    Pertinent History  Pt. is a 77 y.o. male who was admitted to [facility] with a Right MCA Infarct with Lft Hemiparesis, and severe oropharyngeal dysphagia, and severe dysarthria.    Patient Stated Goals  Patient would like to be as independent as possible.     Currently in Pain?  No/denies      OT TREATMENT    Cognitive Skills:  Pt. worked on  following design patterns using 2" pegs on the [employer]. Pt. worked on copying simple to Production manager. Pt. required extensive cues initially to complete each pattern, progressively requiring less cues.. Pt. continues to work on improving ADL, and IADL functioning.                        OT Education - 01/02/18 1533    Education provided  Yes    Education Details  fine motor coordination skills, UE strengthening, following pattern designs    Person(s) Educated  Patient    Methods  Explanation;Demonstration    Comprehension  Verbalized understanding;Returned demonstration;Verbal cues required          OT Long Term Goals - 11/22/17 1620      OT LONG TERM GOAL #1   Title  Pt. will demonstrate independence with visual compensatory strategies during ADL, and IADL tasks.    Baseline  11/22/2017: Impaired visual scanning, and visual search strategies.     Time  12    Period  Weeks    Status  On-going    Target Date  11/22/17      OT LONG TERM GOAL #2   Title  Pt. will increase

## 2018-01-03 NOTE — Therapy (Signed)
[place] [facility] MAIN [facility] SERVICES [address] [place], [place], [address] Phone: [phone]   Fax:  [phone]  Speech Language Pathology Treatment  Patient Details  Name: Ronald [name] MRN: [id number] Date of Birth: Jul 25, 1940 Referring Provider: [name]   Encounter Date: 01/02/2018  End of Session - 01/03/18 0850    Visit Number  34    Number of Visits  37    Date for SLP Re-Evaluation  01/13/18    SLP Start Time  1600    SLP Stop Time   1645    SLP Time Calculation (min)  45 min    Activity Tolerance  Patient tolerated treatment well       Past Medical History:  Diagnosis Date  . COPD (chronic obstructive pulmonary disease) (HCC)   . Emphysema lung (HCC)   . Hx of long term use of blood thinners   . Hypertension   . Stroke [facility]) 2013  . Thyroid disease    hypothyroidism    Past Surgical History:  Procedure Laterality Date  . IR ANGIO EXTRACRAN SEL COM CAROTID INNOMINATE UNI R MOD SED  06/19/2017  . IR ANGIO VERTEBRAL SEL SUBCLAVIAN INNOMINATE UNI R MOD SED  06/19/2017  . IR GASTROSTOMY TUBE MOD SED  07/04/2017  . RADIOLOGY WITH ANESTHESIA N/A 06/19/2017   Procedure: RADIOLOGY WITH ANESTHESIA;  Surgeon: Julieanne Cotton, MD;  Location: [facility] OR;  Service: Radiology;  Laterality: N/A;    There were no vitals filed for this visit.  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  The patient received inpatient rehab services 06/23/2017 - 07/04/2017 and home health services on discharge.         Treatment  Provided   Treatment provided  Cognitive-Linquistic;Dysphagia      Pain Assessment   Pain Assessment  No/denies pain      Cognitive-Linquistic Treatment   Treatment focused on  Dysarthria;Apraxia;Voice;Other (comment);Patient/family/caregiver education   Dysphagia   Skilled Treatment  SWALLOWING:   Patient returned with completed log. Patient will have his PEG removed soon.  The patient drank 8 ounces of water, with cough or throat clear fewer trials.  When he is able to attend and follow the strategy of 2 swallows per bolus, he is less likely to cough. Patient consistently able to generate clear, strong vocal quality after cough indicating clearing of residue.  PHONATION: Patient is able to generate a loud h+vowel.   Loudly recite automatic speech series with 100% accuracy.  Patient able to imitate words/phrases, maintaining loudness for all words with 85% accuracy.        Assessment / Recommendations / Plan   Plan  Continue with current plan of care      Progression Toward Goals   Progression toward goals  Progressing toward goals       SLP Education - 01/03/18 0849    Education provided  Yes    Education Details  Cough is protective- cough if you need to    [employer]) Educated  Patient    Methods  Explanation    Comprehension  Verbalized understanding         SLP Long Term Goals - 12/13/17 1126      SLP LONG TERM GOAL #1   Title  Pt will improve speech intelligibility for words and phrases by controlling rate of speech, over-articulation, and increased loudness to achieve 80% intelligibility with min. SLP cues.    Time  4    Period  Weeks    Status  Partially Met    Target Date  01/13/18      SLP LONG TERM GOAL #2   Title  Patient will execute Swallowing and Voice Building HEP 5-7 days per week, with assistance from family as needed.    Time  4    Period  Weeks    Status  Partially Met    Target Date  01/13/18      SLP LONG TERM GOAL #4   Title  Patient will consume 4  oz thin liquid with no more than 1 overt sign/symptom of aspiration.     Time  4    Status  Partially Met    Target Date  01/13/18       Plan - 01/03/18 0850    Clinical Impression Statement  The patient demonstrates improving vocal strength and endurance.  He continues to cough/throat clear with liquids, but is consistently able to generate clear, strong vocal quality after cough indicating clearing of residue.  The patient has coughing, but he has COPD/emphysema which complicates the clinical picture.  The patient is now taking in sufficient fluid to meet his needs and will have his PEG removed soon.      Speech Therapy Frequency  2x / week    Duration  4 weeks    Treatment/Interventions  Pharyngeal strengthening exercises;Diet toleration management by SLP;Oral motor exercises;Compensatory strategies;SLP instruction and feedback;Patient/family education    Potential to Achieve Goals  Good    Potential Considerations  Ability to learn/carryover information;Pain level;Family/community support;Co-morbidities;Previous level of function;Cooperation/participation level;Severity of impairments;Medical prognosis    SLP Home Exercise Plan  Respiratory support activities, voice activities, oral intake log, swallowing recommendations, and things to observe for.      Consulted and Agree with Plan of Care  Patient       Patient will benefit from skilled therapeutic intervention in order to improve the following deficits and impairments:   Oropharyngeal dysphagia  Dysarthria and anarthria  Cognitive communication deficit    Problem List Patient Active Problem List   Diagnosis Date Noted  . PEG (percutaneous endoscopic gastrostomy) status (HCC)   . Benign essential HTN   . Hypertensive crisis   . Dysarthria, post-stroke   . Acute pain of right shoulder   . 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 Dysphagia, oropharyngeal 06/23/2017  . Respiratory failure (HCC)   . Palliative care  encounter   . Aspiration pneumonia due to gastric secretions (HCC)   . Atrial fibrillation (HCC)   . 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   [name], [name] SLP  [name] 01/03/2018, 8:51 AM  [place] [facility] MAIN [facility] SERVICES [address] [place], [place], [address] Phone: [phone]   Fax:  [phone]   Name: Ronald [name] MRN: [id number] Date of Birth: [birth year]

## 2018-01-04 ENCOUNTER — Ambulatory Visit: Payer: Medicare Other | Admitting: Speech Pathology

## 2018-01-04 ENCOUNTER — Ambulatory Visit: Payer: Medicare Other | Admitting: Occupational Therapy

## 2018-01-04 ENCOUNTER — Encounter: Payer: Self-pay | Admitting: Occupational Therapy

## 2018-01-04 ENCOUNTER — Encounter: Payer: Self-pay | Admitting: Speech Pathology

## 2018-01-04 DIAGNOSIS — M6281 Muscle weakness (generalized): Secondary | ICD-10-CM | POA: Diagnosis not present

## 2018-01-04 DIAGNOSIS — R41841 Cognitive communication deficit: Secondary | ICD-10-CM | POA: Diagnosis not present

## 2018-01-04 DIAGNOSIS — R1312 Dysphagia, oropharyngeal phase: Secondary | ICD-10-CM

## 2018-01-04 DIAGNOSIS — R278 Other lack of coordination: Secondary | ICD-10-CM

## 2018-01-04 DIAGNOSIS — R471 Dysarthria and anarthria: Secondary | ICD-10-CM | POA: Diagnosis not present

## 2018-01-04 NOTE — Therapy (Signed)
[place] MAIN [facility] SERVICES [address] [place], [place], [address] Phone: [phone]   Fax:  [phone]  Speech Language Pathology Treatment  Patient Details  Name: Ronald [name] MRN: [id number] Date of Birth: 09-27-1940 Referring Provider: Charlett Blake   Encounter Date: 01/04/2018  End of Session - 01/04/18 1654    Visit Number  35    Number of Visits  37    Date for SLP Re-Evaluation  01/13/18    SLP Start Time  1600    SLP Stop Time   1650    SLP Time Calculation (min)  50 min    Activity Tolerance  Patient tolerated treatment well       Past Medical History:  Diagnosis Date  . COPD (chronic obstructive pulmonary disease) ([place])   . Emphysema lung ([place])   . Hx of long term use of blood thinners   . Hypertension   . Stroke [facility]) 2013  . Thyroid disease    hypothyroidism    Past Surgical History:  Procedure Laterality Date  . IR ANGIO EXTRACRAN SEL COM CAROTID INNOMINATE UNI R MOD SED  06/19/2017  . IR ANGIO VERTEBRAL SEL SUBCLAVIAN INNOMINATE UNI R MOD SED  06/19/2017  . IR GASTROSTOMY TUBE MOD SED  07/04/2017  . RADIOLOGY WITH ANESTHESIA N/A 06/19/2017   Procedure: RADIOLOGY WITH ANESTHESIA;  Surgeon: Luanne Bras, MD;  Location: [place];  Service: Radiology;  Laterality: N/A;    There were no vitals filed for this visit.  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  The patient received inpatient rehab services 06/23/2017 - 07/04/2017 and home health services on discharge.         Treatment  Provided   Treatment provided  Cognitive-Linquistic      Pain Assessment   Pain Assessment  No/denies pain      Cognitive-Linquistic Treatment   Treatment focused on  Dysarthria;Apraxia;Voice;Other (comment);Patient/family/caregiver education   Dysphagia   Skilled Treatment  SWALLOWING:   Patient returned with completed log. Patient will have his PEG removed soon.  The patient drank 8 ounces of water, with cough or throat clear fewer trials.  When he is able to attend and follow the strategy of 2 swallows per bolus, he is less likely to cough. Patient consistently able to generate clear, strong vocal quality after cough indicating clearing of residue.  PHONATION: Patient is able to generate a loud h+vowel.   Loudly recite automatic speech series with 100% accuracy.  Patient able to imitate words/phrases, maintaining loudness for all words with 85% accuracy.        Assessment / Recommendations / Plan   Plan  Continue with current plan of care      Progression Toward Goals   Progression toward goals  Progressing toward goals       SLP Education - 01/04/18 1654    Education provided  Yes    Education Details  Cough is protective- cough if you need to    [employer]) Educated  Patient    Methods  Explanation    Comprehension  Verbalized understanding         SLP Long Term Goals - 12/13/17 1126      SLP LONG TERM GOAL #1   Title  Pt will improve speech intelligibility for words and phrases by controlling rate of speech, over-articulation, and increased loudness to achieve 80% intelligibility with min. SLP cues.    Time  4    Period  Weeks    Status  Partially Met    Target Date  01/13/18      SLP LONG TERM GOAL #2   Title  Patient will execute Swallowing and Voice Building HEP 5-7 days per week, with assistance from family as needed.    Time  4    Period  Weeks    Status  Partially Met    Target Date  01/13/18      SLP LONG TERM GOAL #4   Title  Patient will consume 4 oz thin  liquid with no more than 1 overt sign/symptom of aspiration.     Time  4    Status  Partially Met    Target Date  01/13/18       Plan - 01/04/18 1654    Clinical Impression Statement  The patient demonstrates improving vocal strength and endurance.  He continues to cough/throat clear with liquids, but is consistently able to generate clear, strong vocal quality after cough indicating clearing of residue.  The patient has coughing, but he has COPD/emphysema which complicates the clinical picture.  The patient is now taking in sufficient fluid to meet his needs and will have his PEG removed soon.      Speech Therapy Frequency  2x / week    Duration  4 weeks    Treatment/Interventions  Pharyngeal strengthening exercises;Diet toleration management by SLP;Oral motor exercises;Compensatory strategies;SLP instruction and feedback;Patient/family education    Potential to Achieve Goals  Good    Potential Considerations  Ability to learn/carryover information;Pain level;Family/community support;Co-morbidities;Previous level of function;Cooperation/participation level;Severity of impairments;Medical prognosis    SLP Home Exercise Plan  Respiratory support activities, voice activities, oral intake log, swallowing recommendations, and things to observe for.      Consulted and Agree with Plan of Care  Patient       Patient will benefit from skilled therapeutic intervention in order to improve the following deficits and impairments:   Dysarthria and anarthria  Oropharyngeal dysphagia  Cognitive communication deficit    Problem List Patient Active Problem List   Diagnosis Date Noted  . PEG (percutaneous endoscopic gastrostomy) status ([place])   . Benign essential HTN   . Hypertensive crisis   . Dysarthria, post-stroke   . Acute pain of right shoulder   . 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 Dysphagia, oropharyngeal 06/23/2017  . Respiratory failure ([place])   . Palliative care encounter   .  Aspiration pneumonia due to gastric secretions ([place])   . Atrial fibrillation ([place])   . Stroke (cerebrum) ([place]) 06/19/2017   Ronald Sea, [name] SLP  Ronald [name] 01/04/2018, 4:55 PM  [place] MAIN [facility] SERVICES [address] [place], [place], [address] Phone: [phone]   Fax:  [phone]   Name: Ronald [name] MRN: [id number] Date of Birth: 07/29/1940

## 2018-01-04 NOTE — Therapy (Signed)
[facility] MAIN [facility] SERVICES [address] [place], [place], [address] Phone: [phone]   Fax:  [phone]  Occupational Therapy Treatment  Patient Details  Name: Ronald [name] MRN: [id number] Date of Birth: 10/29/1940 Referring Provider: Dr. Wynn Banker   Encounter Date: 01/04/2018  OT End of Session - 01/04/18 1543    Visit Number  26    Number of Visits  48    Date for OT Re-Evaluation  02/14/18    Authorization Type  Visit 9 of 10 for progress report period starting 11/22/2017    OT Start Time  1515    OT Stop Time  1600    OT Time Calculation (min)  45 min    Activity Tolerance  Patient tolerated treatment well    Behavior During Therapy  [facility] for tasks assessed/performed       Past Medical History:  Diagnosis Date  . COPD (chronic obstructive pulmonary disease) (HCC)   . Emphysema lung (HCC)   . Hx of long term use of blood thinners   . Hypertension   . Stroke [facility]) 2013  . Thyroid disease    hypothyroidism    Past Surgical History:  Procedure Laterality Date  . IR ANGIO EXTRACRAN SEL COM CAROTID INNOMINATE UNI R MOD SED  06/19/2017  . IR ANGIO VERTEBRAL SEL SUBCLAVIAN INNOMINATE UNI R MOD SED  06/19/2017  . IR GASTROSTOMY TUBE MOD SED  07/04/2017  . RADIOLOGY WITH ANESTHESIA N/A 06/19/2017   Procedure: RADIOLOGY WITH ANESTHESIA;  Surgeon: Julieanne Cotton, MD;  Location: [facility] OR;  Service: Radiology;  Laterality: N/A;    There were no vitals filed for this visit.  Subjective Assessment - 01/04/18 1542    Pertinent History  Pt. is a 76 y.o. male who was admitted to [facility] with a Right MCA Infarct with Lft Hemiparesis, and severe oropharyngeal dysphagia, and severe dysarthria.    Patient Stated Goals  Patient would like to be as independent as possible.     Currently in Pain?  No/denies      OT TREATMENT    Neuro muscular re-education:  Pt. worked on bilateral [facility] skills needed to grasp small resistive beads following  simple design patterns. Pt. worked on connecting the beads using a 3pt. pinch, and pt. Pinch grasp. Pt. worked on disconnecting the resistive beads using a lateral pinch grasp, and 3pt. pinch grasp.  Pt. Worked on grasping small 1/4' pegs, placing them  on a pegboard following a design pattern. Pt. Required increased cues for designs with diagonals, and formulating the diagonal plane.  Therapeutic Exercise:  Pt. Worked on the [employer] for 8 min. With constant monitoring of the BUEs. Pt. Worked on changing, and alternating forward reverse position every 2 min. Pt. Worked on level 4.5.                            OT Education - 01/04/18 1543    Education provided  Yes    Education Details  fine motor coordination skills, UE strengthening, following pattern designs    Person(s) Educated  Patient    Methods  Explanation;Demonstration    Comprehension  Verbalized understanding;Returned demonstration;Verbal cues required          OT Long Term Goals - 11/22/17 1620      OT LONG TERM GOAL #1   Title  Pt. will demonstrate independence with visual compensatory strategies during ADL, and IADL tasks.  [id number] Date of Birth: 1940-08-26  [facility] MAIN [facility] SERVICES [address] [place], [place], [address] Phone: [phone]   Fax:  [phone]  Occupational Therapy Treatment  Patient Details  Name: Ronald [name] MRN: [id number] Date of Birth: 10/29/1940 Referring Provider: Dr. Wynn Banker   Encounter Date: 01/04/2018  OT End of Session - 01/04/18 1543    Visit Number  26    Number of Visits  48    Date for OT Re-Evaluation  02/14/18    Authorization Type  Visit 9 of 10 for progress report period starting 11/22/2017    OT Start Time  1515    OT Stop Time  1600    OT Time Calculation (min)  45 min    Activity Tolerance  Patient tolerated treatment well    Behavior During Therapy  [facility] for tasks assessed/performed       Past Medical History:  Diagnosis Date  . COPD (chronic obstructive pulmonary disease) (HCC)   . Emphysema lung (HCC)   . Hx of long term use of blood thinners   . Hypertension   . Stroke [facility]) 2013  . Thyroid disease    hypothyroidism    Past Surgical History:  Procedure Laterality Date  . IR ANGIO EXTRACRAN SEL COM CAROTID INNOMINATE UNI R MOD SED  06/19/2017  . IR ANGIO VERTEBRAL SEL SUBCLAVIAN INNOMINATE UNI R MOD SED  06/19/2017  . IR GASTROSTOMY TUBE MOD SED  07/04/2017  . RADIOLOGY WITH ANESTHESIA N/A 06/19/2017   Procedure: RADIOLOGY WITH ANESTHESIA;  Surgeon: Julieanne Cotton, MD;  Location: [facility] OR;  Service: Radiology;  Laterality: N/A;    There were no vitals filed for this visit.  Subjective Assessment - 01/04/18 1542    Pertinent History  Pt. is a 76 y.o. male who was admitted to [facility] with a Right MCA Infarct with Lft Hemiparesis, and severe oropharyngeal dysphagia, and severe dysarthria.    Patient Stated Goals  Patient would like to be as independent as possible.     Currently in Pain?  No/denies      OT TREATMENT    Neuro muscular re-education:  Pt. worked on bilateral [facility] skills needed to grasp small resistive beads following  simple design patterns. Pt. worked on connecting the beads using a 3pt. pinch, and pt. Pinch grasp. Pt. worked on disconnecting the resistive beads using a lateral pinch grasp, and 3pt. pinch grasp.  Pt. Worked on grasping small 1/4' pegs, placing them  on a pegboard following a design pattern. Pt. Required increased cues for designs with diagonals, and formulating the diagonal plane.  Therapeutic Exercise:  Pt. Worked on the [employer] for 8 min. With constant monitoring of the BUEs. Pt. Worked on changing, and alternating forward reverse position every 2 min. Pt. Worked on level 4.5.                            OT Education - 01/04/18 1543    Education provided  Yes    Education Details  fine motor coordination skills, UE strengthening, following pattern designs    Person(s) Educated  Patient    Methods  Explanation;Demonstration    Comprehension  Verbalized understanding;Returned demonstration;Verbal cues required          OT Long Term Goals - 11/22/17 1620      OT LONG TERM GOAL #1   Title  Pt. will demonstrate independence with visual compensatory strategies during ADL, and IADL tasks.

## 2018-01-05 ENCOUNTER — Other Ambulatory Visit: Payer: Self-pay | Admitting: Physical Medicine & Rehabilitation

## 2018-01-05 DIAGNOSIS — I69322 Dysarthria following cerebral infarction: Secondary | ICD-10-CM

## 2018-01-05 DIAGNOSIS — Z931 Gastrostomy status: Secondary | ICD-10-CM

## 2018-01-09 ENCOUNTER — Ambulatory Visit: Payer: Medicare Other | Admitting: Speech Pathology

## 2018-01-09 ENCOUNTER — Ambulatory Visit: Payer: Medicare Other | Admitting: Occupational Therapy

## 2018-01-11 ENCOUNTER — Ambulatory Visit: Payer: Medicare Other | Admitting: Occupational Therapy

## 2018-01-11 ENCOUNTER — Ambulatory Visit: Payer: Medicare Other | Admitting: Speech Pathology

## 2018-01-12 ENCOUNTER — Ambulatory Visit (HOSPITAL_COMMUNITY)
Admission: RE | Admit: 2018-01-12 | Discharge: 2018-01-12 | Disposition: A | Discharge status: Home / Self Care | Payer: Medicare Other | Source: Ambulatory Visit | Attending: Physical Medicine & Rehabilitation | Admitting: Physical Medicine & Rehabilitation

## 2018-01-12 ENCOUNTER — Encounter (HOSPITAL_COMMUNITY): Payer: Self-pay | Admitting: Student

## 2018-01-12 DIAGNOSIS — Z8673 Personal history of transient ischemic attack (TIA), and cerebral infarction without residual deficits: Secondary | ICD-10-CM | POA: Diagnosis not present

## 2018-01-12 DIAGNOSIS — Z431 Encounter for attention to gastrostomy: Secondary | ICD-10-CM | POA: Diagnosis not present

## 2018-01-12 DIAGNOSIS — Z931 Gastrostomy status: Secondary | ICD-10-CM

## 2018-01-12 DIAGNOSIS — I69322 Dysarthria following cerebral infarction: Secondary | ICD-10-CM

## 2018-01-12 HISTORY — PX: IR GASTROSTOMY TUBE REMOVAL: IMG5492

## 2018-01-12 MED ORDER — LIDOCAINE VISCOUS HCL 2 % MT SOLN
OROMUCOSAL | Status: AC
  Filled 2018-01-12: qty 15

## 2018-01-12 NOTE — Progress Notes (Signed)
Successful removal of percutaneous gastrostomy tube using manual traction.  Consent signed by daughter who was present for removal. No immediate complications.   Elwin Mochalexandra Munira Polson PA-C 01/12/2018 12:50 PM

## 2018-01-16 ENCOUNTER — Encounter: Payer: Medicare Other | Admitting: Speech Pathology

## 2018-01-16 ENCOUNTER — Encounter: Payer: Medicare Other | Admitting: Occupational Therapy

## 2018-01-18 ENCOUNTER — Encounter: Payer: Medicare Other | Admitting: Occupational Therapy

## 2018-01-18 ENCOUNTER — Encounter: Payer: Medicare Other | Admitting: Speech Pathology

## 2018-01-22 ENCOUNTER — Ambulatory Visit (HOSPITAL_BASED_OUTPATIENT_CLINIC_OR_DEPARTMENT_OTHER): Payer: Medicare Other | Admitting: Physical Medicine & Rehabilitation

## 2018-01-22 ENCOUNTER — Encounter: Payer: Self-pay | Admitting: Physical Medicine & Rehabilitation

## 2018-01-22 ENCOUNTER — Encounter: Payer: Medicare Other | Attending: Physical Medicine & Rehabilitation

## 2018-01-22 VITALS — BP 176/69 | HR 88 | Ht 70.0 in | Wt 165.4 lb

## 2018-01-22 DIAGNOSIS — I639 Cerebral infarction, unspecified: Secondary | ICD-10-CM | POA: Diagnosis not present

## 2018-01-22 DIAGNOSIS — Z87891 Personal history of nicotine dependence: Secondary | ICD-10-CM | POA: Diagnosis not present

## 2018-01-22 DIAGNOSIS — R1312 Dysphagia, oropharyngeal phase: Secondary | ICD-10-CM

## 2018-01-22 DIAGNOSIS — Z7901 Long term (current) use of anticoagulants: Secondary | ICD-10-CM | POA: Diagnosis not present

## 2018-01-22 DIAGNOSIS — R29898 Other symptoms and signs involving the musculoskeletal system: Secondary | ICD-10-CM

## 2018-01-22 DIAGNOSIS — J449 Chronic obstructive pulmonary disease, unspecified: Secondary | ICD-10-CM | POA: Diagnosis not present

## 2018-01-22 DIAGNOSIS — I69322 Dysarthria following cerebral infarction: Secondary | ICD-10-CM | POA: Diagnosis not present

## 2018-01-22 DIAGNOSIS — R29818 Other symptoms and signs involving the nervous system: Secondary | ICD-10-CM | POA: Diagnosis not present

## 2018-01-22 DIAGNOSIS — I10 Essential (primary) hypertension: Secondary | ICD-10-CM | POA: Diagnosis not present

## 2018-01-22 DIAGNOSIS — I69398 Other sequelae of cerebral infarction: Secondary | ICD-10-CM | POA: Insufficient documentation

## 2018-01-22 DIAGNOSIS — R269 Unspecified abnormalities of gait and mobility: Secondary | ICD-10-CM | POA: Diagnosis not present

## 2018-01-22 DIAGNOSIS — I4891 Unspecified atrial fibrillation: Secondary | ICD-10-CM | POA: Diagnosis not present

## 2018-01-22 DIAGNOSIS — E039 Hypothyroidism, unspecified: Secondary | ICD-10-CM | POA: Insufficient documentation

## 2018-01-22 DIAGNOSIS — Z931 Gastrostomy status: Secondary | ICD-10-CM | POA: Insufficient documentation

## 2018-01-22 NOTE — Progress Notes (Signed)
Subjective:    Patient ID: Ronald [name], male    DOB: 1940/10/05, 77 y.o.   MRN: [id number]  HPI RIght MCA infarct Feb 2019  Left MCA stroke March 2014  PEG removed 3 wks ago.  No complications afterward.  Warfarin held for 3 d prior to procedure. Had some soreness.  PEG site healed, daughter kept dressing it  Eating 3 meals per day.  Eats light breakfast, oatmeal and bananas  Drinking with occ cough Reviewed SLP note.   OT working on fine motor left upper extremity and functional activities. Pain Inventory Average Pain 2 Pain Right Now 0 My pain is aching  In the last 24 hours, has pain interfered with the following? General activity 0 Relation with others 0 Enjoyment of life 0 What TIME of day is your pain at its worst? evening Sleep (in general) Fair  Pain is worse with: unsure Pain improves with: rest Relief from Meds: 8  Mobility walk without assistance ability to climb steps?  yes do you drive?  no  Function disabled: date disabled . I need assistance with the following:  meal prep, household duties and shopping  Neuro/Psych No problems in this area  Prior Studies Any changes since last visit?  no  Physicians involved in your care Any changes since last visit?  no   Family History  Problem Relation Age of Onset  . Parkinson's disease Mother   . Colon cancer Mother   . Emphysema Father   . Stroke Sister   . Stroke Brother    Social History   Socioeconomic History  . Marital status: Single    Spouse name: Not on file  . Number of children: Not on file  . Years of education: Not on file  . Highest education level: Not on file  Occupational History  . Not on file  Social Needs  . Financial resource strain: Not on file  . Food insecurity:    Worry: Not on file    Inability: Not on file  . Transportation needs:    Medical: Not on file    Non-medical: Not on file  Tobacco Use  . Smoking status: Former Smoker    Last attempt to quit:  06/05/2017    Years since quitting: 0.6  . Smokeless tobacco: Former Engineer, water and Sexual Activity  . Alcohol use: Not Currently  . Drug use: No  . Sexual activity: Not on file  Lifestyle  . Physical activity:    Days per week: Not on file    Minutes per session: Not on file  . Stress: Not on file  Relationships  . Social connections:    Talks on phone: Not on file    Gets together: Not on file    Attends religious service: Not on file    Active member of club or organization: Not on file    Attends meetings of clubs or organizations: Not on file    Relationship status: Not on file  Other Topics Concern  . Not on file  Social History Narrative  . Not on file   Past Surgical History:  Procedure Laterality Date  . IR ANGIO EXTRACRAN SEL COM CAROTID INNOMINATE UNI R MOD SED  06/19/2017  . IR ANGIO VERTEBRAL SEL SUBCLAVIAN INNOMINATE UNI R MOD SED  06/19/2017  . IR GASTROSTOMY TUBE MOD SED  07/04/2017  . IR GASTROSTOMY TUBE REMOVAL  01/12/2018  . RADIOLOGY WITH ANESTHESIA N/A 06/19/2017   Procedure: RADIOLOGY WITH ANESTHESIA;  Surgeon: Julieanne Cotton, MD;  Location: [facility] OR;  Service: Radiology;  Laterality: N/A;   Past Medical History:  Diagnosis Date  . COPD (chronic obstructive pulmonary disease) (HCC)   . Emphysema lung (HCC)   . Hx of long term use of blood thinners   . Hypertension   . Stroke [facility]) 2013  . Thyroid disease    hypothyroidism   There were no vitals taken for this visit.  Opioid Risk Score:   Fall Risk Score:  `1  Depression screen PHQ 2/9  Depression screen PHQ 2/9 10/02/2017  Decreased Interest 0  Down, Depressed, Hopeless 0  PHQ - 2 Score 0     Review of Systems  Constitutional: Negative.   HENT: Negative.   Eyes: Negative.   Respiratory: Negative.   Cardiovascular: Negative.   Gastrointestinal: Negative.   Endocrine: Negative.   Genitourinary: Negative.   Musculoskeletal: Negative.   Skin: Negative.   Allergic/Immunologic:  Negative.   Neurological: Negative.   Hematological: Negative.   Psychiatric/Behavioral: Negative.   All other systems reviewed and are negative.      Objective:   Physical Exam  Constitutional: He appears well-developed and well-nourished.  HENT:  Head: Normocephalic and atraumatic.  Cardiovascular: Normal rate and regular rhythm.  Pulmonary/Chest: Effort normal and breath sounds normal.  Abdominal: Soft. Bowel sounds are normal.  PEG site well healed  Ambulates without assistive device no evidence of toe drag or knee instability Speech with severe dysarthria and aphasia.        Assessment & Plan:  #1.  Right MCA infarct February 2019 superimposed on prior left MCA infarct in March 2015.  He is regained modified independent level with mobility.  He still has severe dysarthria and aphasia.  His swallowing function has fortunately improved and no longer requires enteral feeding.  His PEG has been successfully removed without complications.  He is back on his full dose of warfarin. I will see the patient back in approximately 4 months which would be 1 year post stroke right MCA infarct He will finish his outpatient therapy by then.

## 2018-01-23 ENCOUNTER — Encounter: Payer: Medicare Other | Admitting: Speech Pathology

## 2018-01-23 ENCOUNTER — Encounter: Payer: Medicare Other | Admitting: Occupational Therapy

## 2018-01-25 ENCOUNTER — Encounter: Payer: Medicare Other | Admitting: Speech Pathology

## 2018-01-25 ENCOUNTER — Encounter: Payer: Medicare Other | Admitting: Occupational Therapy

## 2018-01-29 ENCOUNTER — Encounter: Payer: Medicare Other | Admitting: Occupational Therapy

## 2018-01-31 ENCOUNTER — Encounter: Payer: Medicare Other | Admitting: Occupational Therapy

## 2018-02-01 ENCOUNTER — Encounter: Payer: Medicare Other | Admitting: Speech Pathology

## 2018-02-01 ENCOUNTER — Encounter: Payer: Medicare Other | Admitting: Occupational Therapy

## 2018-02-06 ENCOUNTER — Encounter: Payer: Medicare Other | Admitting: Occupational Therapy

## 2018-02-08 ENCOUNTER — Encounter: Payer: Medicare Other | Admitting: Occupational Therapy

## 2018-02-13 ENCOUNTER — Encounter: Payer: Medicare Other | Admitting: Occupational Therapy

## 2018-02-15 ENCOUNTER — Encounter: Payer: Medicare Other | Admitting: Occupational Therapy

## 2018-02-20 ENCOUNTER — Encounter: Payer: Medicare Other | Admitting: Occupational Therapy

## 2018-02-21 ENCOUNTER — Encounter: Payer: Self-pay | Admitting: Occupational Therapy

## 2018-02-21 DIAGNOSIS — M6281 Muscle weakness (generalized): Secondary | ICD-10-CM

## 2018-02-21 NOTE — Therapy (Unsigned)
[place] [facility] MAIN [facility] SERVICES [address] [place], [place], [address] Phone: [phone]   Fax:  [phone]  February 21, 2018    @CCLISTADDRESS @  Occupational Therapy Discharge Summary   Patient: Ronald [name] MRN: [id number] Date of Birth: [birth year]  Diagnosis: Muscle weakness (generalized)  No data recorded  The above patient had been seen in Occupational Therapy 26 scheduled visits.  The treatment consisted of ADL, and IADL training, UE there. Ex, neuromuscular re-ed, cognitive compensatory strategies, visual perceptual functioning, and pt. Education. The patient is: Improved  Subjective:  Pt.'s daughter phoned and reported that the pt. requested to be finished with all therapy services. Pt.'s last visit was 01/04/2018. OT Long Term Goals      OT LONG TERM GOAL #1   Title  Pt. will demonstrate independence with visual compensatory strategies during ADL, and IADL tasks.    Baseline  Discharge: Impaired visual scanning, and visual search strategies.     Time  12    Period  Weeks    Status  On-going    Target Date  10/16//2019      OT LONG TERM GOAL #2   Title  Pt. will increase Left grip strength will increase by 10# to improve ADL/IADL functioning.    Baseline  Discharge: Left grip strength had improved   Time  12    Period  Weeks    Status  On-going    Target Date  02/14/18      OT LONG TERM GOAL #3   Title  Pt. will improve [facility] skills by 5 sec. of speed to be able to manipulate ADL/IADL objects.    Baseline  Discharge: Pt. Progressed with speed, dexterity, and [facility] skills while manipulating small objects.    Time  12    Period  Weeks    Status  On-going    Target Date  02/14/18      OT LONG TERM GOAL #4   Title  Pt. will accurately identify 10/10 home safety hazards for ADLs, and IADLs    Baseline Discharge: Cues were required   Time  12    Period  Weeks    Status  On-going    Target Date  02/14/18          Olegario Messier, MS, OTR/L     [facility] MAIN [facility] SERVICES [address] [place], [place], [address] Phone: [phone]   Fax:  [phone]  Patient: Ronald [name] MRN: [id number] Date of Birth: 03-Sep-1940

## 2018-02-22 ENCOUNTER — Encounter: Payer: Medicare Other | Admitting: Occupational Therapy

## 2018-02-27 ENCOUNTER — Encounter: Payer: Medicare Other | Admitting: Occupational Therapy

## 2018-03-01 ENCOUNTER — Encounter: Payer: Medicare Other | Admitting: Occupational Therapy

## 2018-03-09 DIAGNOSIS — Z8673 Personal history of transient ischemic attack (TIA), and cerebral infarction without residual deficits: Secondary | ICD-10-CM | POA: Diagnosis not present

## 2018-03-09 DIAGNOSIS — E559 Vitamin D deficiency, unspecified: Secondary | ICD-10-CM | POA: Diagnosis not present

## 2018-03-09 DIAGNOSIS — R4701 Aphasia: Secondary | ICD-10-CM | POA: Diagnosis not present

## 2018-03-09 DIAGNOSIS — E785 Hyperlipidemia, unspecified: Secondary | ICD-10-CM | POA: Diagnosis not present

## 2018-03-09 DIAGNOSIS — I10 Essential (primary) hypertension: Secondary | ICD-10-CM | POA: Diagnosis not present

## 2018-03-09 DIAGNOSIS — Z7901 Long term (current) use of anticoagulants: Secondary | ICD-10-CM | POA: Diagnosis not present

## 2018-03-09 DIAGNOSIS — Z87891 Personal history of nicotine dependence: Secondary | ICD-10-CM | POA: Diagnosis not present

## 2018-03-09 DIAGNOSIS — M1990 Unspecified osteoarthritis, unspecified site: Secondary | ICD-10-CM | POA: Diagnosis not present

## 2018-03-09 DIAGNOSIS — E039 Hypothyroidism, unspecified: Secondary | ICD-10-CM | POA: Diagnosis not present

## 2018-03-09 DIAGNOSIS — R531 Weakness: Secondary | ICD-10-CM | POA: Diagnosis not present

## 2018-03-12 DIAGNOSIS — Z8673 Personal history of transient ischemic attack (TIA), and cerebral infarction without residual deficits: Secondary | ICD-10-CM | POA: Diagnosis not present

## 2018-03-12 DIAGNOSIS — Z7901 Long term (current) use of anticoagulants: Secondary | ICD-10-CM | POA: Diagnosis not present

## 2018-03-19 DIAGNOSIS — E039 Hypothyroidism, unspecified: Secondary | ICD-10-CM | POA: Diagnosis not present

## 2018-03-19 DIAGNOSIS — E785 Hyperlipidemia, unspecified: Secondary | ICD-10-CM | POA: Diagnosis not present

## 2018-03-19 DIAGNOSIS — Z7901 Long term (current) use of anticoagulants: Secondary | ICD-10-CM | POA: Diagnosis not present

## 2018-03-19 DIAGNOSIS — Z8673 Personal history of transient ischemic attack (TIA), and cerebral infarction without residual deficits: Secondary | ICD-10-CM | POA: Diagnosis not present

## 2018-03-21 DIAGNOSIS — I10 Essential (primary) hypertension: Secondary | ICD-10-CM | POA: Diagnosis not present

## 2018-03-21 DIAGNOSIS — Z8673 Personal history of transient ischemic attack (TIA), and cerebral infarction without residual deficits: Secondary | ICD-10-CM | POA: Diagnosis not present

## 2018-04-02 DIAGNOSIS — Z8673 Personal history of transient ischemic attack (TIA), and cerebral infarction without residual deficits: Secondary | ICD-10-CM | POA: Diagnosis not present

## 2018-04-02 DIAGNOSIS — Z7901 Long term (current) use of anticoagulants: Secondary | ICD-10-CM | POA: Diagnosis not present

## 2018-04-11 DIAGNOSIS — I10 Essential (primary) hypertension: Secondary | ICD-10-CM | POA: Diagnosis not present

## 2018-04-11 DIAGNOSIS — Z1389 Encounter for screening for other disorder: Secondary | ICD-10-CM | POA: Diagnosis not present

## 2018-04-11 DIAGNOSIS — M1990 Unspecified osteoarthritis, unspecified site: Secondary | ICD-10-CM | POA: Diagnosis not present

## 2018-04-11 DIAGNOSIS — E785 Hyperlipidemia, unspecified: Secondary | ICD-10-CM | POA: Diagnosis not present

## 2018-04-11 DIAGNOSIS — Z8673 Personal history of transient ischemic attack (TIA), and cerebral infarction without residual deficits: Secondary | ICD-10-CM | POA: Diagnosis not present

## 2018-04-11 DIAGNOSIS — Z0000 Encounter for general adult medical examination without abnormal findings: Secondary | ICD-10-CM | POA: Diagnosis not present

## 2018-04-11 DIAGNOSIS — Z7901 Long term (current) use of anticoagulants: Secondary | ICD-10-CM | POA: Diagnosis not present

## 2018-05-07 DIAGNOSIS — Z7901 Long term (current) use of anticoagulants: Secondary | ICD-10-CM | POA: Diagnosis not present

## 2018-05-07 DIAGNOSIS — Z8673 Personal history of transient ischemic attack (TIA), and cerebral infarction without residual deficits: Secondary | ICD-10-CM | POA: Diagnosis not present

## 2018-05-07 DIAGNOSIS — R636 Underweight: Secondary | ICD-10-CM | POA: Diagnosis not present

## 2018-05-08 DIAGNOSIS — Z7901 Long term (current) use of anticoagulants: Secondary | ICD-10-CM | POA: Diagnosis not present

## 2018-05-22 DIAGNOSIS — E039 Hypothyroidism, unspecified: Secondary | ICD-10-CM | POA: Diagnosis not present

## 2018-05-22 DIAGNOSIS — Z7901 Long term (current) use of anticoagulants: Secondary | ICD-10-CM | POA: Diagnosis not present

## 2018-05-22 DIAGNOSIS — Z8673 Personal history of transient ischemic attack (TIA), and cerebral infarction without residual deficits: Secondary | ICD-10-CM | POA: Diagnosis not present

## 2018-06-07 DIAGNOSIS — Z7901 Long term (current) use of anticoagulants: Secondary | ICD-10-CM | POA: Diagnosis not present

## 2018-06-11 ENCOUNTER — Ambulatory Visit: Payer: Self-pay | Admitting: Adult Health

## 2018-06-11 ENCOUNTER — Telehealth: Payer: Self-pay

## 2018-06-11 DIAGNOSIS — E039 Hypothyroidism, unspecified: Secondary | ICD-10-CM | POA: Diagnosis not present

## 2018-06-11 DIAGNOSIS — Z8673 Personal history of transient ischemic attack (TIA), and cerebral infarction without residual deficits: Secondary | ICD-10-CM | POA: Diagnosis not present

## 2018-06-11 DIAGNOSIS — I10 Essential (primary) hypertension: Secondary | ICD-10-CM | POA: Diagnosis not present

## 2018-06-11 DIAGNOSIS — Z7901 Long term (current) use of anticoagulants: Secondary | ICD-10-CM | POA: Diagnosis not present

## 2018-06-11 DIAGNOSIS — E785 Hyperlipidemia, unspecified: Secondary | ICD-10-CM | POA: Diagnosis not present

## 2018-06-11 DIAGNOSIS — Z9181 History of falling: Secondary | ICD-10-CM | POA: Diagnosis not present

## 2018-06-11 NOTE — Progress Notes (Deleted)
[facility] [address] [place]. [place] [address] [phone]       OFFICE FOLLOW-UP NOTE  [name] Date of Birth:  08/06/1940 Medical Record Number:  [id number]   No chief complaint on file.   HPI:  06/11/18  Ronald [name] is a 78 year old male who is being seen today for follow-up after stroke in 06/2017.  He continues to have post stroke deficits of ***.  He did have PEG removal on 01/12/2018.  He continues on warfarin without side effects of bleeding or bruising and stable INRs.  Continues on atorvastatin without side effects myalgias.  Blood pressure today ***.  Denies new or worsening stroke/TIA symptoms.  HISTORY SUMMARY: 12/08/17 visit: Patient is being seen today for follow-up visit and is accompanied by his daughter.  He continues to have significant dysarthria for which she is undergoing speech therapy.  He is also working with speech therapy in hopes of removing PEG tube.  He refuses nectar thickened liquids and his therapist is currently working with him on thin liquid intake without aspiration.  He currently is taking in approximately 1000 mL of fluid by mouth daily but he needs at least 1500 mL/day prior to his tube removal.  He is continuing to receive extra fluid intake through his PEG tube.  He also continues to participate in OT for mild left hemiparesis.  He continues to take Coumadin without side effects of bleeding or bruising and this is monitored by his PCP/cardiologist and INR has been stable.  He also continues to take Lipitor without side effects of myalgias and PCP continues to monitor lipid levels.  Blood pressure today mildly elevated at 156/89 but this is monitored at home and typically 130s over 70s.  He is currently living with his daughter but is able to maintain all ADLs without difficulty.  Denies new or worsening stroke/TIA symptoms.  09/07/17 visit PS:  Ronald L Burnsis a 77 y.o.malewith PMH of AFIB on Coumadin with subtherapeutic  INR, Hx of CVA, HTN and Hypothyroidism who presents with acute left-sided weakness and questionable M2 occlusion with improvement following IV TPA. Due to patients persistent deficits an angiogram was done to assess if there was truly an LVO and he was taken for this which was negative.  He was admitted to the intensive care unit and follow-up imaging showed hemorrhagic conversion in the right temporal lobe with mild mass-effect but no midline shift.  His blood pressure was tightly controlled he was neurologically monitored remained stable.  He had significant dysphagia and dysarthria.  He was seen by physical occupational and speech therapist.  He was initially started on aspirin due to hemorrhagic transformation and transferred to inpatient rehab and subsequently has been switched to warfarin now.   He still has significant dysarthria and dysphagia and has a PEG tube for feeding.  He is undergoing outpatient therapies.  He had modified barium swallow done yesterday and has been cleared for honey thick liquids.  His INR has been fluctuating but last INR was 2.1 earlier this week.  He is able to ambulate independently but has some balance issues but has had no falls or injuries.  He complains of occipital headache as well as pain shooting down his left shoulder.  He does have a history of degenerative cervical spine disease and was not considered a surgical candidate in the past.  He does take some Tylenol which seems to help.  He is presently living at home with his daughter.  ROS:   14 system review of systems is positive for speech difficulty and all other systems negative PMH:  Past Medical History:  Diagnosis Date  . COPD (chronic obstructive pulmonary disease) (HCC)   . Emphysema lung (HCC)   . Hx of long term use of blood thinners   . Hypertension   . Stroke [facility]) 2013  . Thyroid disease    hypothyroidism    Social History:  Social History   Socioeconomic History  . Marital status:  Single    Spouse name: Not on file  . Number of children: Not on file  . Years of education: Not on file  . Highest education level: Not on file  Occupational History  . Not on file  Social Needs  . Financial resource strain: Not on file  . Food insecurity:    Worry: Not on file    Inability: Not on file  . Transportation needs:    Medical: Not on file    Non-medical: Not on file  Tobacco Use  . Smoking status: Former Smoker    Last attempt to quit: 06/05/2017    Years since quitting: 1.0  . Smokeless tobacco: Former Engineer, water and Sexual Activity  . Alcohol use: Not Currently  . Drug use: No  . Sexual activity: Not on file  Lifestyle  . Physical activity:    Days per week: Not on file    Minutes per session: Not on file  . Stress: Not on file  Relationships  . Social connections:    Talks on phone: Not on file    Gets together: Not on file    Attends religious service: Not on file    Active member of club or organization: Not on file    Attends meetings of clubs or organizations: Not on file    Relationship status: Not on file  . Intimate partner violence:    Fear of current or ex partner: Not on file    Emotionally abused: Not on file    Physically abused: Not on file    Forced sexual activity: Not on file  Other Topics Concern  . Not on file  Social History Narrative  . Not on file    Medications:   Current Outpatient Medications on File Prior to Visit  Medication Sig Dispense Refill  . atorvastatin (LIPITOR) 40 MG tablet Place 1 tablet (40 mg total) into feeding tube daily at 6 PM. 30 tablet 1  . diclofenac sodium (VOLTAREN) 1 % GEL Apply 2 g topically 4 (four) times daily. 1 Tube 1  . famotidine (PEPCID AC) 10 MG chewable tablet Place 10 mg into feeding tube 2 (two) times daily.     Marland Kitchen levothyroxine (SYNTHROID, LEVOTHROID) 125 MCG tablet Place 1 tablet (125 mcg total) into feeding tube daily before breakfast. 30 tablet 1  . Metoprolol Succinate 50 MG CS24  Take by mouth.    . warfarin (COUMADIN) 5 MG tablet Take 1 tablet (5 mg total) by mouth daily after supper. (Patient taking differently: Place 5 mg into feeding tube daily after supper. 5mg  mon-fri and 2.5mg  sat and sun) 30 tablet 2  . Water For Irrigation, Sterile (FREE WATER) SOLN Place 200 mLs into feeding tube 4 (four) times daily.     No current facility-administered medications on file prior to visit.     Allergies:  No Known Allergies  Physical Exam General: Frail elderly Caucasian male seated, in no evident distress.  He has a PEG tube  in his abdomen Head: head normocephalic and atraumatic.  Neck: supple with no carotid or supraclavicular bruits Cardiovascular: regular rate and rhythm, no murmurs Musculoskeletal: Mild kyphosis Skin:  no rash/petichiae Vascular:  Normal pulses all extremities  There were no vitals filed for this visit. Neurologic Exam Mental Status: Awake and fully alert. Oriented to place and time. Recent and remote memory intact. Attention span, concentration and fund of knowledge appropriate. Mood and affect appropriate.  Severe dysarthria and can be understood with some difficulty.  Was able to state his name and date of birth along with naming objects on NIH SS card. Cranial Nerves: Fundoscopic exam reveals sharp disc margins. Pupils equal, briskly reactive to light. Extraocular movements full without nystagmus. Visual fields full to confrontation. Hearing intact. Facial sensation intact.  Moderate left lower facial weakness including weakness of eye closure., tongue, palate moves normally and symmetrically.  Motor: Normal bulk and tone. Normal strength in all tested extremity muscles.  Except mild left grip weakness and left lower extremity hip flexor weakness.  Diminished fine finger movements on the left.  Orbits right over left upper extremity. Sensory.: intact to touch ,pinprick .position and vibratory sensation.  Coordination: Rapid alternating movements  normal on right side.  Finger-to-nose and heel-to-shin performed accurately on right side. Gait and Station: Arises from chair without difficulty. Stance is normal. Gait demonstrates normal stride length and balance . Able to heel, toe and tandem walk with moderate difficulty.  Reflexes: 1+ and asymmetric and brisker on the left side.. Toes downgoing.    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  He has significant residual dysarthria and dysphagia.  Vascular risk factors of hyperlipidemia and atrial fibrillation.  Patient returns today for follow-up visit and overall is doing well with continued improvement in his dysarthria, dysphagia and left hemiparesis.    PLAN: -Continue warfarin daily  and Lipitor for secondary stroke prevention -F/u with PCP regarding your HLD, HTN and Coumadin management -continue to monitor BP at home -Advised to continue to stay active and maintain a healthy diet -Maintain strict control of hypertension with blood pressure goal below 130/90, diabetes with hemoglobin A1c goal below 6.5% and cholesterol with LDL cholesterol (bad cholesterol) goal below 70 mg/dL. I also advised the patient to eat a healthy diet with plenty of whole grains, cereals, fruits and vegetables, exercise regularly and maintain ideal body weight.  Follow up in 6 months or call earlier if needed  Greater than 50% of time during this 25 minute visit was spent on counseling,explanation of diagnosis of embolic stroke, atrial fibrillation, planning of further management, discussion with patient and family and coordination of care  George Hugh, [facility]  [facility] [address] [place], [place] [address]  Phone [phone] Fax [phone] Note: This document was prepared with digital dictation and possible smart  phrase technology. Any transcriptional errors that result from this process are unintentional.

## 2018-06-11 NOTE — Telephone Encounter (Signed)
Patient no show for appt today. 

## 2018-06-12 ENCOUNTER — Encounter: Payer: Self-pay | Admitting: Adult Health

## 2018-06-19 ENCOUNTER — Encounter: Payer: Medicare Other | Attending: Physical Medicine & Rehabilitation

## 2018-06-19 ENCOUNTER — Ambulatory Visit: Payer: Medicare Other | Admitting: Physical Medicine & Rehabilitation

## 2018-07-04 DIAGNOSIS — E785 Hyperlipidemia, unspecified: Secondary | ICD-10-CM | POA: Diagnosis not present

## 2018-07-04 DIAGNOSIS — I10 Essential (primary) hypertension: Secondary | ICD-10-CM | POA: Diagnosis not present

## 2018-08-15 DIAGNOSIS — E785 Hyperlipidemia, unspecified: Secondary | ICD-10-CM | POA: Diagnosis not present

## 2018-08-15 DIAGNOSIS — M1990 Unspecified osteoarthritis, unspecified site: Secondary | ICD-10-CM | POA: Diagnosis not present

## 2018-08-20 DIAGNOSIS — Z7901 Long term (current) use of anticoagulants: Secondary | ICD-10-CM | POA: Diagnosis not present

## 2018-09-10 DIAGNOSIS — Z7901 Long term (current) use of anticoagulants: Secondary | ICD-10-CM | POA: Diagnosis not present

## 2018-09-12 DIAGNOSIS — I10 Essential (primary) hypertension: Secondary | ICD-10-CM | POA: Diagnosis not present

## 2018-09-12 DIAGNOSIS — M1990 Unspecified osteoarthritis, unspecified site: Secondary | ICD-10-CM | POA: Diagnosis not present

## 2018-10-03 DIAGNOSIS — I10 Essential (primary) hypertension: Secondary | ICD-10-CM | POA: Diagnosis not present

## 2018-10-03 DIAGNOSIS — E785 Hyperlipidemia, unspecified: Secondary | ICD-10-CM | POA: Diagnosis not present

## 2018-10-16 DIAGNOSIS — Z7901 Long term (current) use of anticoagulants: Secondary | ICD-10-CM | POA: Diagnosis not present

## 2018-11-27 DIAGNOSIS — I10 Essential (primary) hypertension: Secondary | ICD-10-CM | POA: Diagnosis not present

## 2018-11-27 DIAGNOSIS — E785 Hyperlipidemia, unspecified: Secondary | ICD-10-CM | POA: Diagnosis not present

## 2018-12-03 DIAGNOSIS — Z7901 Long term (current) use of anticoagulants: Secondary | ICD-10-CM | POA: Diagnosis not present

## 2018-12-04 DIAGNOSIS — I10 Essential (primary) hypertension: Secondary | ICD-10-CM | POA: Diagnosis not present

## 2018-12-04 DIAGNOSIS — E785 Hyperlipidemia, unspecified: Secondary | ICD-10-CM | POA: Diagnosis not present

## 2019-01-03 DIAGNOSIS — E785 Hyperlipidemia, unspecified: Secondary | ICD-10-CM | POA: Diagnosis not present

## 2019-01-03 DIAGNOSIS — Z7901 Long term (current) use of anticoagulants: Secondary | ICD-10-CM | POA: Diagnosis not present

## 2019-01-03 DIAGNOSIS — E039 Hypothyroidism, unspecified: Secondary | ICD-10-CM | POA: Diagnosis not present

## 2019-01-03 DIAGNOSIS — I4891 Unspecified atrial fibrillation: Secondary | ICD-10-CM | POA: Diagnosis not present

## 2019-01-21 ENCOUNTER — Ambulatory Visit: Payer: Medicare Other | Admitting: Cardiology

## 2019-02-01 DIAGNOSIS — E785 Hyperlipidemia, unspecified: Secondary | ICD-10-CM | POA: Diagnosis not present

## 2019-02-01 DIAGNOSIS — I10 Essential (primary) hypertension: Secondary | ICD-10-CM | POA: Diagnosis not present

## 2019-02-13 DIAGNOSIS — Z7901 Long term (current) use of anticoagulants: Secondary | ICD-10-CM | POA: Diagnosis not present

## 2019-03-06 DIAGNOSIS — I10 Essential (primary) hypertension: Secondary | ICD-10-CM | POA: Diagnosis not present

## 2019-03-06 DIAGNOSIS — E785 Hyperlipidemia, unspecified: Secondary | ICD-10-CM | POA: Diagnosis not present

## 2019-04-10 DIAGNOSIS — Z7901 Long term (current) use of anticoagulants: Secondary | ICD-10-CM | POA: Diagnosis not present

## 2019-04-10 DIAGNOSIS — E039 Hypothyroidism, unspecified: Secondary | ICD-10-CM | POA: Diagnosis not present

## 2019-04-10 DIAGNOSIS — I10 Essential (primary) hypertension: Secondary | ICD-10-CM | POA: Diagnosis not present

## 2019-04-10 DIAGNOSIS — E785 Hyperlipidemia, unspecified: Secondary | ICD-10-CM | POA: Diagnosis not present

## 2019-04-12 DIAGNOSIS — E039 Hypothyroidism, unspecified: Secondary | ICD-10-CM | POA: Diagnosis not present

## 2019-04-12 DIAGNOSIS — I10 Essential (primary) hypertension: Secondary | ICD-10-CM | POA: Diagnosis not present

## 2019-04-12 DIAGNOSIS — M1990 Unspecified osteoarthritis, unspecified site: Secondary | ICD-10-CM | POA: Diagnosis not present

## 2019-04-12 DIAGNOSIS — E785 Hyperlipidemia, unspecified: Secondary | ICD-10-CM | POA: Diagnosis not present

## 2019-06-11 IMAGING — CT CT ANGIO HEAD
1 of 8 series · 6 of 33 positions shown · IV contrast (isovue)
Comparison: Head CT earlier same day

CLINICAL DATA: Code stroke presentation with left-sided weakness.

EXAM:
CT ANGIOGRAPHY HEAD AND NECK
TECHNIQUE: Multidetector CT imaging of the head and neck was performed using
the standard protocol during bolus administration of intravenous
contrast. Multiplanar CT image reconstructions and MIPs were
obtained to evaluate the vascular anatomy. Carotid stenosis
measurements (when applicable) are obtained utilizing NASCET
criteria, using the distal internal carotid diameter as the
denominator.
CONTRAST:  50 cc Isovue 370

[Series 7: cta neck axial · axial · 0.39mm/px · z∈[-293,-53]mm · 6 of 336 slices shown]
[im 48/336  soft-tissue]
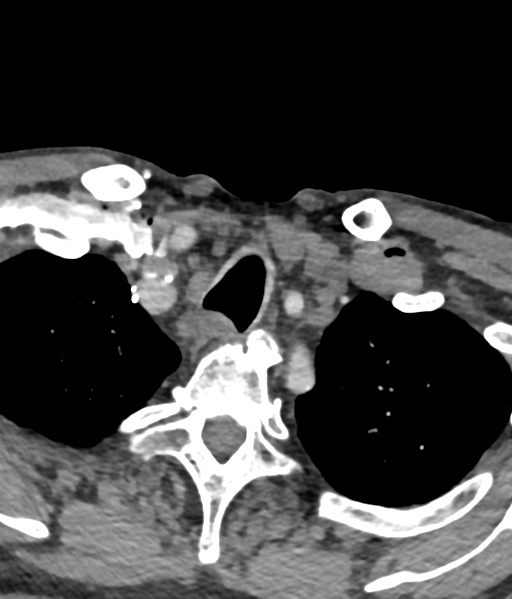
[im 96/336  bone]
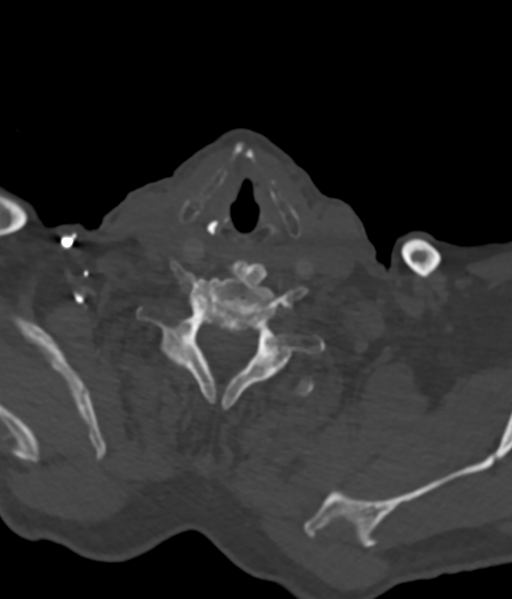
[im 144/336  soft-tissue]
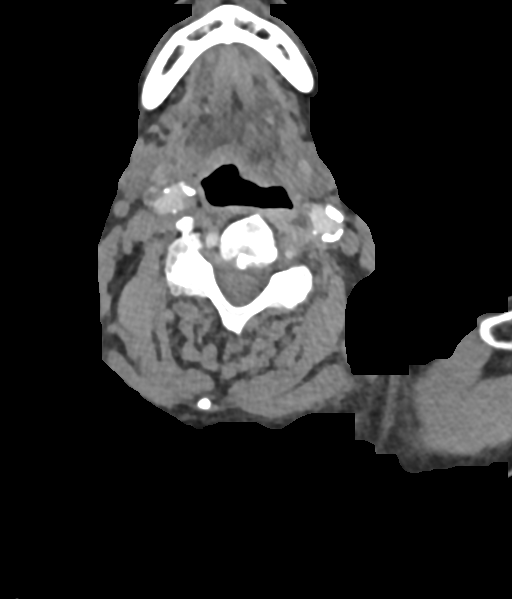
[im 192/336  bone]
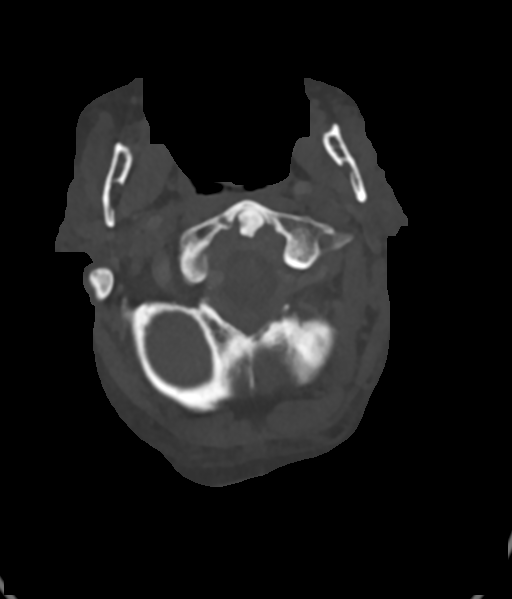
[im 240/336  soft-tissue]
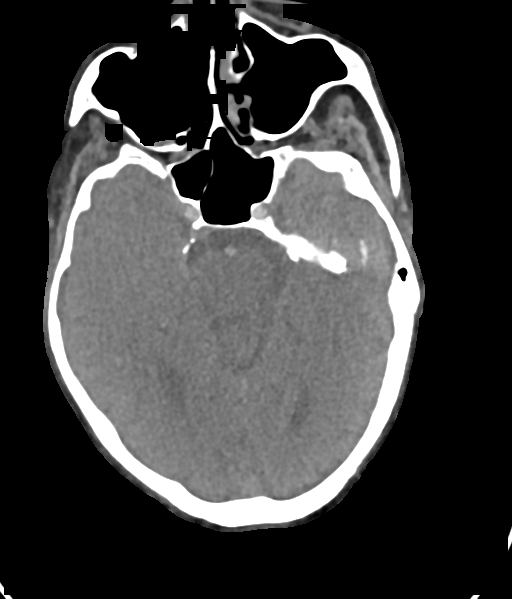
[im 288/336  bone]
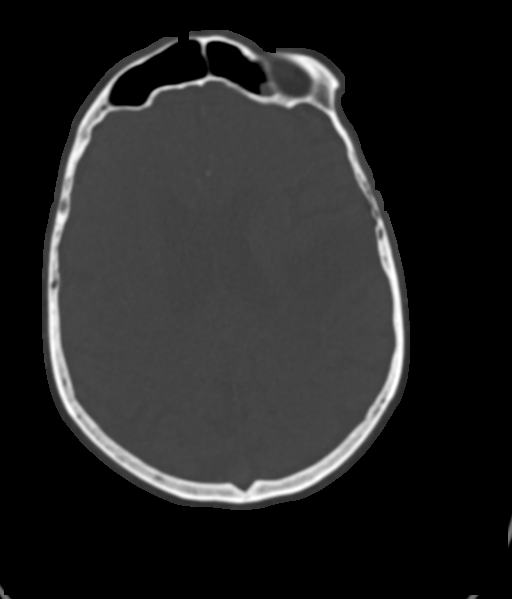

[6 of 33 positions shown; findings below may reference images not displayed]

FINDINGS: CTA NECK FINDINGS

Aortic arch: Aortic atherosclerosis but no aneurysm or dissection.
Branching pattern of the brachiocephalic vessels from the arch is
normal without origin stenosis.

Right carotid system: Common carotid artery widely patent to the
bifurcation region. Atherosclerotic plaque at the carotid
bifurcation and proximal ICA. Minimal diameter in the ICA bulb is
4.5 mm. Compared to a more distal cervical ICA diameter 4.5 mm,
there is no stenosis.

Left carotid system: Common carotid artery widely patent to the
bifurcation. Calcified plaque at the carotid bifurcation and ICA
bulb. Minimal diameter of the ICA bulb measures 3.2 mm. Compared to
a more distal cervical ICA diameter of 4.5 mm, this indicates a
30-40% stenosis.

Vertebral arteries: Right vertebral artery is dominant.
Atherosclerotic calcification at the right vertebral artery origin
with stenosis of 50%. Beyond that, the vessel is widely patent
through the cervical region. Non dominant left vertebral artery
shows calcified plaque at its origin with 50% stenosis. Beyond that,
the vessel is patent through the cervical region to the foramen
magnum.

Skeleton: Degenerative spondylosis.  No bony canal stenosis.

Other neck: No mass or lymphadenopathy.

Upper chest: Mild emphysema and scarring.

Review of the MIP images confirms the above findings

CTA HEAD FINDINGS

Anterior circulation: Both internal carotid arteries are patent
through the skull base and siphon regions. There is siphon
atherosclerotic calcification maximal stenosis estimated at 30%. The
A1 and M1 segments are patent on both sides. There is moderate
stenosis in the right A1 segment. No M2 occlusion seen on the left.
On the right, there appears to be occlusion of an anterior temporal
M2 branch. There are serial stenoses in the right anterior cerebral
artery.

Posterior circulation: Dominant right vertebral artery shows 30%
stenosis in the V4 segment but is patent to the basilar. Non
dominant left vertebral artery shows 50% stenosis in the V4 segment
but is patent to the basilar. No basilar stenosis. The basilar
artery does show some atherosclerotic irregularity. Superior
cerebellar and posterior cerebral arteries show flow. Patent
posterior communicating artery on the right.

Venous sinuses: Patent and normal. Limited opacification due to
early face scanning.

Anatomic variants: None significant.

Delayed phase: No abnormal enhancement.

Review of the MIP images confirms the above findings
IMPRESSION: Diffuse intracranial atherosclerotic disease. Apparent occlusion of
a right insular/anterior temporal M2 branch. Serial severe stenoses
in the right anterior cerebral artery. Atherosclerotic irregularity
in both A1 segments. Atherosclerotic irregularity of the basilar
artery without flow limiting stenosis.

Aortic atherosclerosis. Carotid bifurcation atherosclerosis without
stenosis. 50% vertebral artery origin stenoses. 30-50% V4 stenoses.

## 2019-06-11 IMAGING — XA IR VERTEBRAL  NON-SELECT UNILAT  RIGHT(MS)
1 series · 13 of 24 positions shown · IV contrast (IODINE)
Comparison: CT angiogram of the head and neck of 06/19/2017.

CLINICAL DATA: Acute onset of confusion. Left-sided weakness. CT
angiogram of the head and neck suggestive of a right MCA M2
occlusion.

EXAM:
IR ANGIO EXTRACRAN SELECT COM CAROTID INNOMINATE UNI*R* MOD SED; IR
ANGIO VERTEBRAL SEL SUBCLAVIAN INNOMINATE UNI RIGHT MOD SED
TECHNIQUE: Informed written consent was obtained from the patient's daughter
after a thorough discussion of the procedural risks, benefits and
alternatives. All questions were addressed. Maximal Sterile Barrier
Technique was utilized including caps, mask, sterile gowns, sterile
gloves, sterile drape, hand hygiene and skin antiseptic. A timeout
was performed prior to the initiation of the procedure.

[Series 300: dr. (person_name) · 13 of 90 slices shown]
[im 1/90]
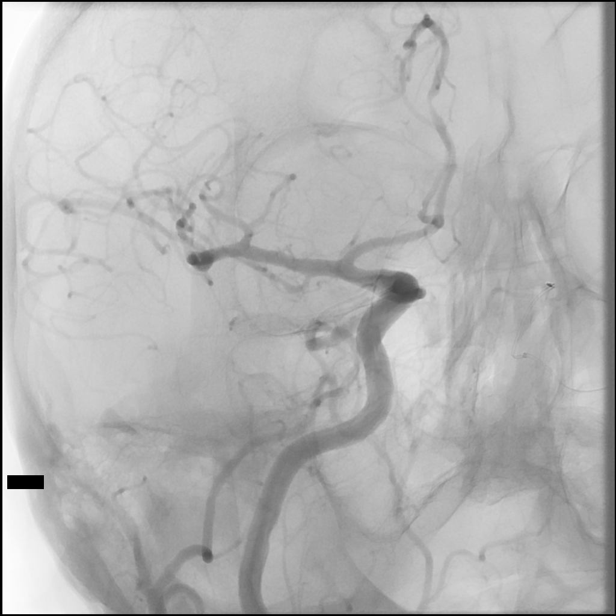
[im 8/90]
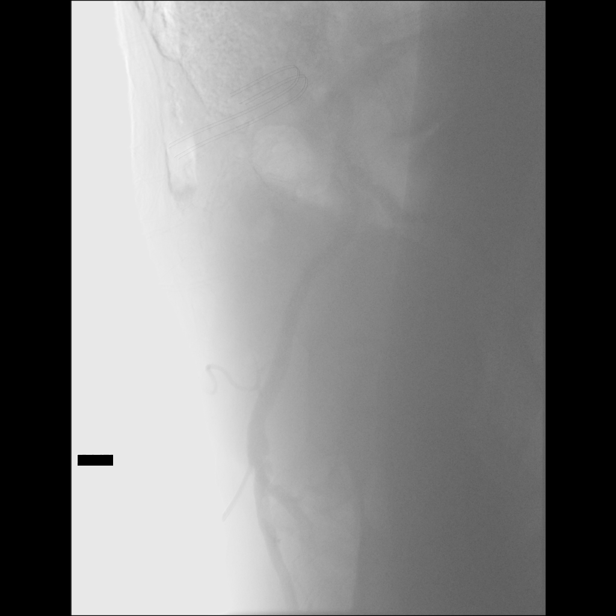
[im 16/90]
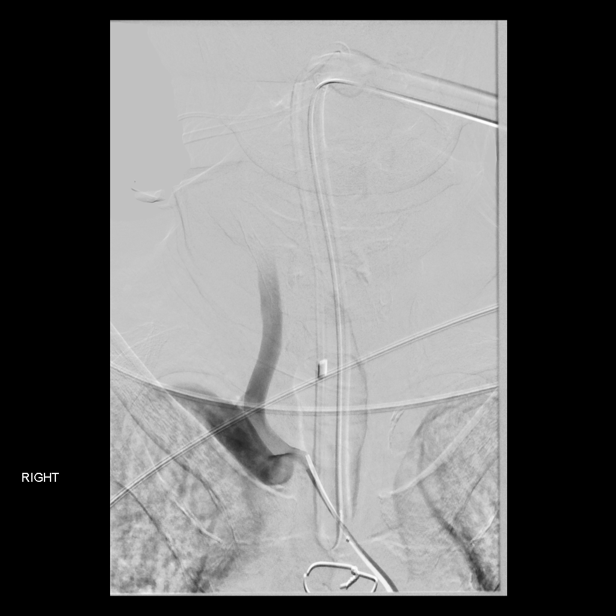
[im 24/90]
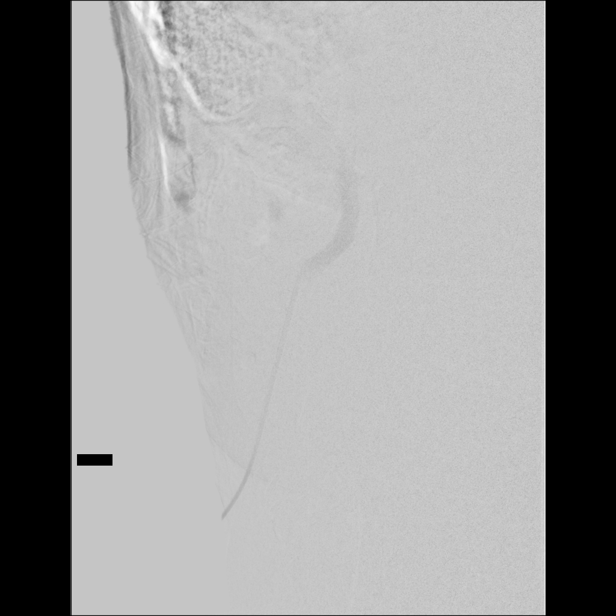
[im 31/90]
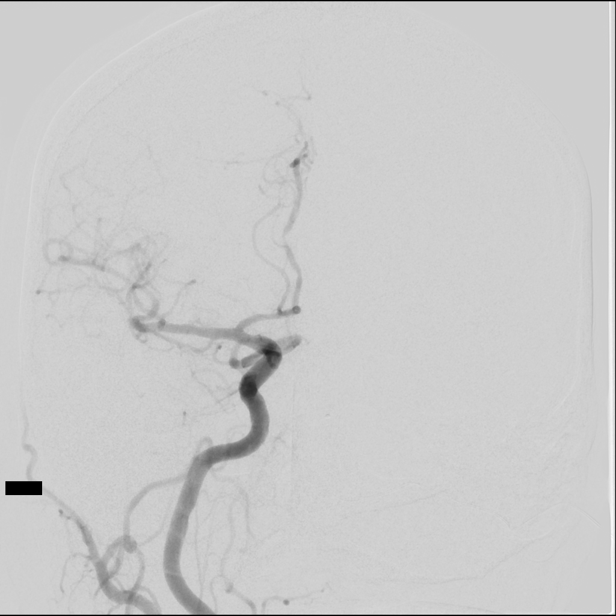
[im 39/90]
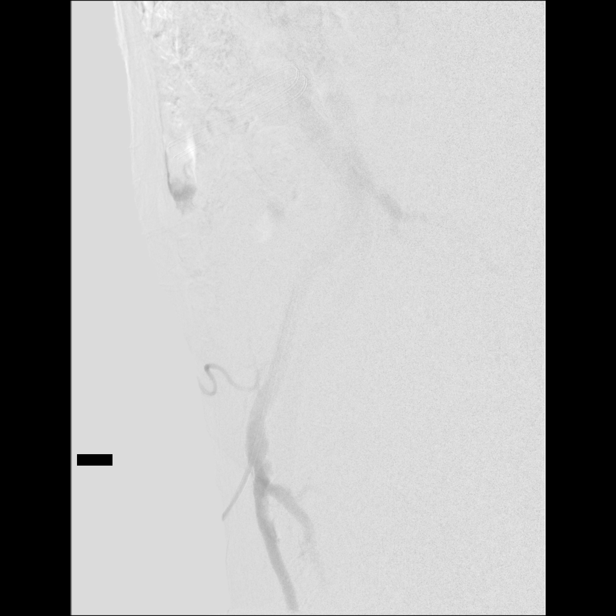
[im 47/90]
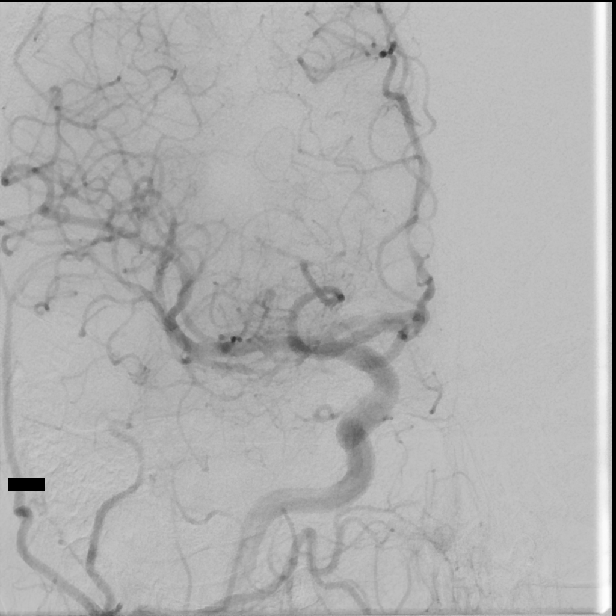
[im 51/90]
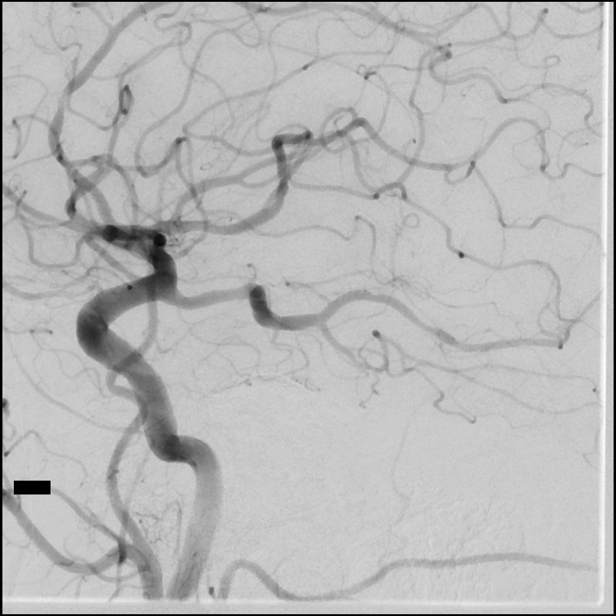
[im 59/90]
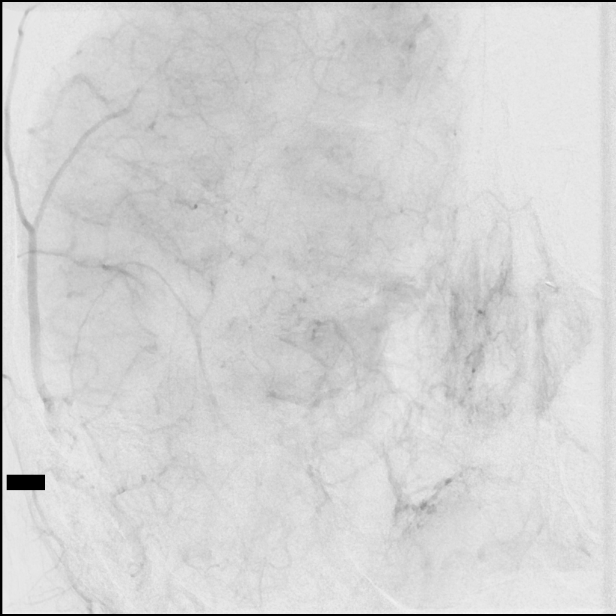
[im 66/90]
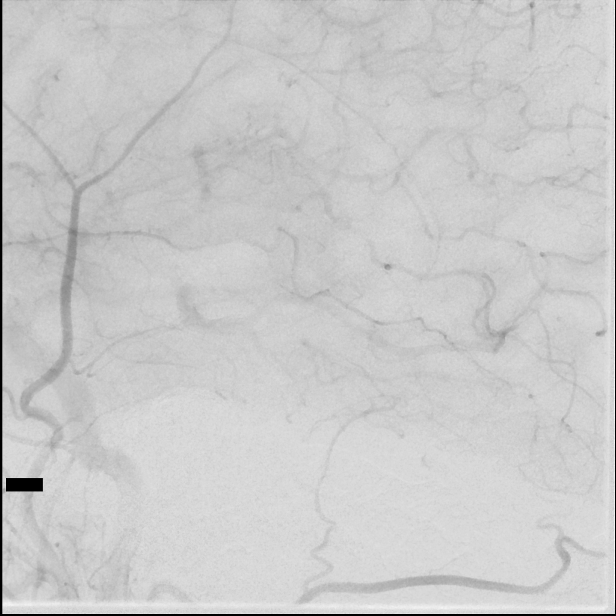
[im 74/90]
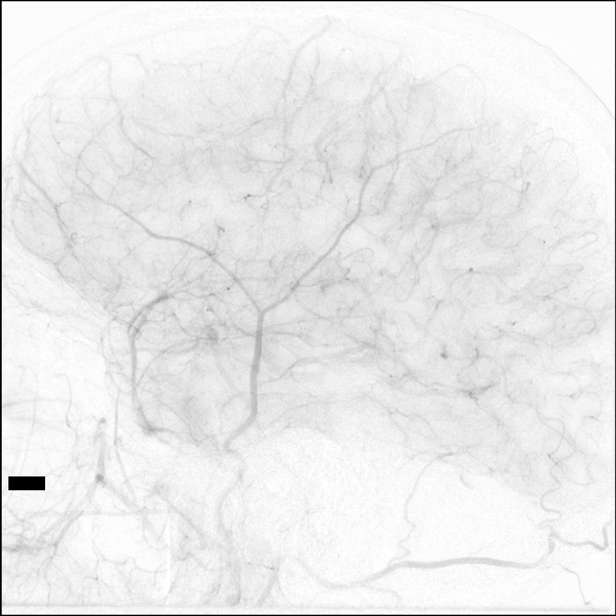
[im 82/90]
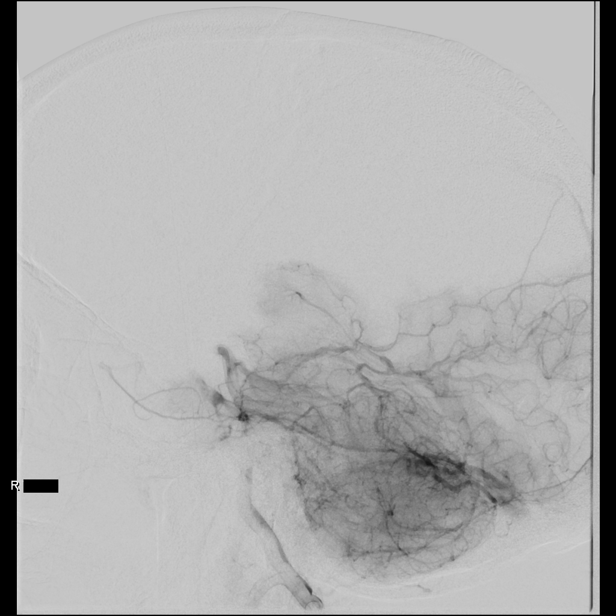
[im 90/90]
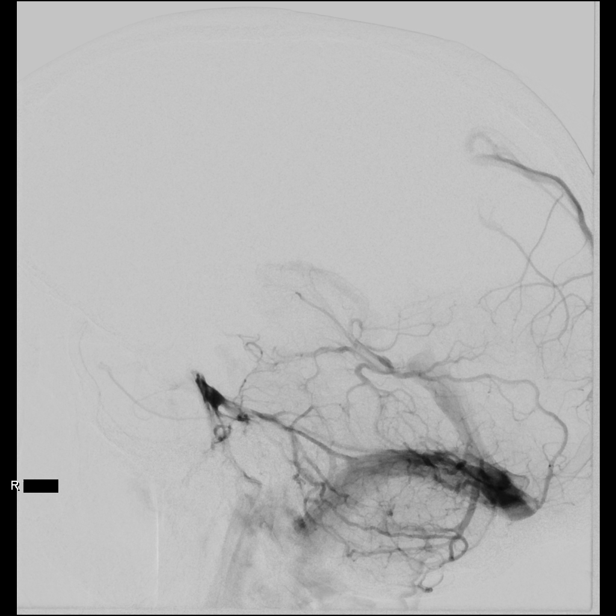

[13 of 24 positions shown; findings below may reference images not displayed]

MEDICATIONS:
Heparin 0777 units IV; Ancef 2 g IV antibiotic was administered
within 1 hour of the procedure.

ANESTHESIA/SEDATION:
General anesthesia.

CONTRAST:  Isovue 300 approximately 50 mL.

FLUOROSCOPY TIME:  Fluoroscopy Time: 5 minutes 15 seconds (401 mGy).

COMPLICATIONS:
None immediate.
The right groin was prepped and draped in the usual sterile fashion.
Thereafter using modified Seldinger technique, transfemoral access
into the right common femoral artery was obtained without
difficulty. Over a 0.035 inch guidewire, a 5 French Pinnacle sheath
was inserted. Through this, and also over 0.035 inch guidewire, a 5
French JB 1 catheter was advanced to the aortic arch region and
selectively positioned in the right common carotid artery, the right
vertebral artery.
FINDINGS: The innominate artery injection demonstrates the origin of the right
common carotid artery and the right subclavian artery to be widely
patent.

The right common carotid arteriogram demonstrates wide patency of
the right external carotid artery and its branches. The right
internal carotid artery at the bulb to the cranial skull base also
opacifies widely.

The petrous, cavernous and supraclinoid segments are normally
opacified.

A right posterior communicating artery is seen opacifying the right
posterior cerebral artery distribution.

The right middle cerebral artery and the right anterior cerebral
artery opacify into the capillary and venous phases.

Multiple focal areas of caliber irregularity and narrowing are seen
involving the right anterior cerebral artery A2 segment and to a
lesser degree the A3 pericallosal region.

The right middle cerebral artery trifurcation region demonstrates a
segmental focal stenosis of the dominant inferior division
proximally.

More distally mild focal areas of caliber irregularity involving the
distal M3 branches.

The right vertebral artery origin is widely patent.

The vessel is seen to opacify to the cranial skull base. Wide
patency is seen of the right vertebrobasilar junction and the right
posterior-inferior cerebellar artery.

The basilar artery, the left posterior cerebral artery, the superior
cerebellar arteries and the anterior-inferior cerebellar arteries
demonstrate opacification into the capillary and venous phases.
Nonvisualization of the right posterior cerebral artery is related
to the dominant right posterior communicating artery supplying the
right posterior cerebral artery distribution.

Caliber irregularity is noted of the right vertebrobasilar junction
in the mid basilar artery probably indicative of intracranial
atherosclerotic disease.
IMPRESSION: Angiographically no evidence of vessel occlusions involving the
right MCA and the right vertebral artery basilar artery circulation.

Focal areas of caliber irregularity with narrowing involving the
right anterior cerebral artery A1 A2 segments, the dominant inferior
division of the right middle cerebral artery, the right
vertebrobasilar junction and the basilar artery probably indicative
of intracranial arteriosclerosis.

PLAN:
As per referring physician.

## 2019-06-12 IMAGING — DX DG CHEST 1V PORT
1 series · 1 of 1 positions shown · non-contrast
Comparison: Radiograph yesterday at 3565 hour

CLINICAL DATA: Endotracheal tube present.

EXAM:
PORTABLE CHEST 1 VIEW

[chest ap]
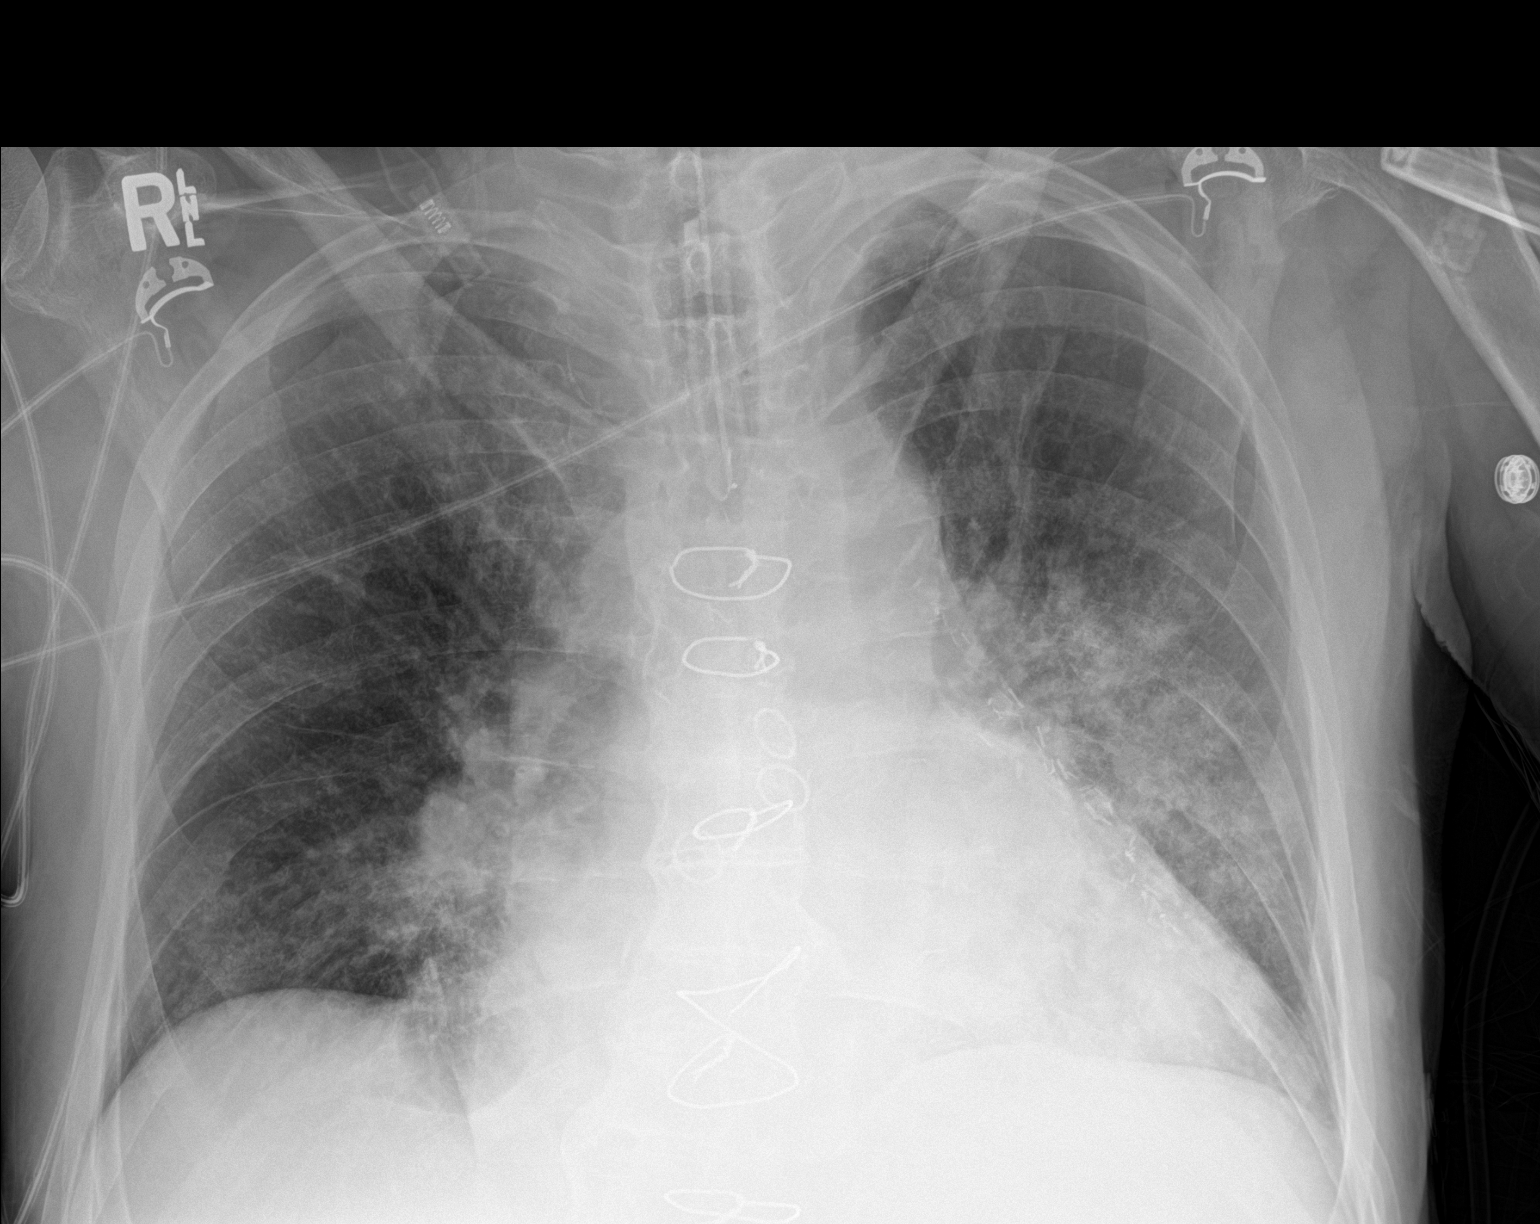

[1 of 1 positions shown; findings below may reference images not displayed]

FINDINGS: Endotracheal tube in place, tip at the level of the clavicular
heads. Patient is post median sternotomy. Cardiomegaly is unchanged.
Developing fluffy left greater than right perihilar opacities. No
large pleural effusion or pneumothorax.
IMPRESSION: 1. Tip of the endotracheal tube at the thoracic inlet.
2. Developing fluffy perihilar opacities, left greater than right,
suspicious for pulmonary edema. Cardiomegaly is unchanged.

## 2021-06-15 ENCOUNTER — Emergency Department (HOSPITAL_COMMUNITY): Payer: Medicare Other

## 2021-06-15 ENCOUNTER — Encounter (HOSPITAL_COMMUNITY): Payer: Self-pay | Admitting: Emergency Medicine

## 2021-06-15 ENCOUNTER — Observation Stay (HOSPITAL_COMMUNITY)
Admission: EM | Admit: 2021-06-15 | Discharge: 2021-06-16 | Disposition: A | Discharge status: Home / Self Care | Payer: Medicare Other | Attending: Internal Medicine | Admitting: Internal Medicine

## 2021-06-15 ENCOUNTER — Other Ambulatory Visit: Payer: Self-pay

## 2021-06-15 DIAGNOSIS — J441 Chronic obstructive pulmonary disease with (acute) exacerbation: Secondary | ICD-10-CM | POA: Diagnosis not present

## 2021-06-15 DIAGNOSIS — E039 Hypothyroidism, unspecified: Secondary | ICD-10-CM | POA: Diagnosis not present

## 2021-06-15 DIAGNOSIS — D689 Coagulation defect, unspecified: Principal | ICD-10-CM

## 2021-06-15 DIAGNOSIS — I482 Chronic atrial fibrillation, unspecified: Secondary | ICD-10-CM | POA: Insufficient documentation

## 2021-06-15 DIAGNOSIS — I2510 Atherosclerotic heart disease of native coronary artery without angina pectoris: Secondary | ICD-10-CM | POA: Insufficient documentation

## 2021-06-15 DIAGNOSIS — Z7901 Long term (current) use of anticoagulants: Secondary | ICD-10-CM | POA: Insufficient documentation

## 2021-06-15 DIAGNOSIS — F17210 Nicotine dependence, cigarettes, uncomplicated: Secondary | ICD-10-CM | POA: Diagnosis not present

## 2021-06-15 DIAGNOSIS — Z20822 Contact with and (suspected) exposure to covid-19: Secondary | ICD-10-CM | POA: Diagnosis not present

## 2021-06-15 DIAGNOSIS — R1310 Dysphagia, unspecified: Secondary | ICD-10-CM | POA: Insufficient documentation

## 2021-06-15 DIAGNOSIS — R195 Other fecal abnormalities: Secondary | ICD-10-CM | POA: Insufficient documentation

## 2021-06-15 DIAGNOSIS — Z79899 Other long term (current) drug therapy: Secondary | ICD-10-CM | POA: Diagnosis not present

## 2021-06-15 DIAGNOSIS — Z951 Presence of aortocoronary bypass graft: Secondary | ICD-10-CM | POA: Diagnosis not present

## 2021-06-15 LAB — URINALYSIS, ROUTINE W REFLEX MICROSCOPIC
Bilirubin Urine: NEGATIVE
Glucose, UA: NEGATIVE mg/dL
Hgb urine dipstick: NEGATIVE
Ketones, ur: 5 mg/dL — AB
Leukocytes,Ua: NEGATIVE
Nitrite: NEGATIVE
Protein, ur: NEGATIVE mg/dL
Specific Gravity, Urine: 1.025 (ref 1.005–1.030)
pH: 5 (ref 5.0–8.0)

## 2021-06-15 LAB — I-STAT VENOUS BLOOD GAS, ED
Acid-Base Excess: 3 mmol/L — ABNORMAL HIGH (ref 0.0–2.0)
Bicarbonate: 28.8 mmol/L — ABNORMAL HIGH (ref 20.0–28.0)
Calcium, Ion: 1.16 mmol/L (ref 1.15–1.40)
HCT: 35 % — ABNORMAL LOW (ref 39.0–52.0)
Hemoglobin: 11.9 g/dL — ABNORMAL LOW (ref 13.0–17.0)
O2 Saturation: 38 %
Potassium: 4.1 mmol/L (ref 3.5–5.1)
Sodium: 140 mmol/L (ref 135–145)
TCO2: 30 mmol/L (ref 22–32)
pCO2, Ven: 47.5 mmHg (ref 44–60)
pH, Ven: 7.39 (ref 7.25–7.43)
pO2, Ven: 23 mmHg — CL (ref 32–45)

## 2021-06-15 LAB — POC OCCULT BLOOD, ED: Fecal Occult Bld: POSITIVE — AB

## 2021-06-15 LAB — COMPREHENSIVE METABOLIC PANEL
ALT: 18 U/L (ref 0–44)
AST: 35 U/L (ref 15–41)
Albumin: 3.8 g/dL (ref 3.5–5.0)
Alkaline Phosphatase: 54 U/L (ref 38–126)
Anion gap: 9 (ref 5–15)
BUN: 19 mg/dL (ref 8–23)
CO2: 26 mmol/L (ref 22–32)
Calcium: 9.4 mg/dL (ref 8.9–10.3)
Chloride: 103 mmol/L (ref 98–111)
Creatinine, Ser: 0.83 mg/dL (ref 0.61–1.24)
GFR, Estimated: >60 mL/min (ref >60)
Glucose, Bld: 93 mg/dL (ref 70–99)
Potassium: 4.3 mmol/L (ref 3.5–5.1)
Sodium: 138 mmol/L (ref 135–145)
Total Bilirubin: 0.8 mg/dL (ref 0.3–1.2)
Total Protein: 6.9 g/dL (ref 6.5–8.1)

## 2021-06-15 LAB — CBC WITH DIFFERENTIAL/PLATELET
Abs Immature Granulocytes: 0.03 10*3/uL (ref 0.00–0.07)
Basophils Absolute: 0 10*3/uL (ref 0.0–0.1)
Basophils Relative: 1 %
Eosinophils Absolute: 0.1 10*3/uL (ref 0.0–0.5)
Eosinophils Relative: 2 %
HCT: 40.5 % (ref 39.0–52.0)
Hemoglobin: 14.2 g/dL (ref 13.0–17.0)
Immature Granulocytes: 1 %
Lymphocytes Relative: 26 %
Lymphs Abs: 1.3 10*3/uL (ref 0.7–4.0)
MCH: 32.6 pg (ref 26.0–34.0)
MCHC: 35.1 g/dL (ref 30.0–36.0)
MCV: 93.1 fL (ref 80.0–100.0)
Monocytes Absolute: 0.4 10*3/uL (ref 0.1–1.0)
Monocytes Relative: 8 %
Neutro Abs: 3 10*3/uL (ref 1.7–7.7)
Neutrophils Relative %: 62 %
Platelets: 182 10*3/uL (ref 150–400)
RBC: 4.35 MIL/uL (ref 4.22–5.81)
RDW: 12.8 % (ref 11.5–15.5)
WBC: 4.8 10*3/uL (ref 4.0–10.5)
nRBC: 0 % (ref 0.0–0.2)

## 2021-06-15 LAB — PROTIME-INR
INR: >10 (ref 0.8–1.2)
Prothrombin Time: >90 seconds — ABNORMAL HIGH (ref 11.4–15.2)

## 2021-06-15 LAB — TYPE AND SCREEN
ABO/RH(D): A NEG
Antibody Screen: NEGATIVE

## 2021-06-15 LAB — LIPASE, BLOOD: Lipase: 30 U/L (ref 11–51)

## 2021-06-15 LAB — RESP PANEL BY RT-PCR (FLU A&B, COVID) ARPGX2
Influenza A by PCR: POSITIVE — AB
Influenza B by PCR: NEGATIVE
SARS Coronavirus 2 by RT PCR: NEGATIVE

## 2021-06-15 LAB — APTT: aPTT: 133 seconds — ABNORMAL HIGH (ref 24–36)

## 2021-06-15 LAB — BRAIN NATRIURETIC PEPTIDE: B Natriuretic Peptide: 182.7 pg/mL — ABNORMAL HIGH (ref 0.0–100.0)

## 2021-06-15 MED ORDER — ATORVASTATIN CALCIUM 40 MG PO TABS
40.0000 mg | ORAL_TABLET | Freq: Every day | ORAL | Status: DC
  Administered 2021-06-16: 40 mg via ORAL
  Filled 2021-06-15: qty 1

## 2021-06-15 MED ORDER — IPRATROPIUM-ALBUTEROL 0.5-2.5 (3) MG/3ML IN SOLN
3.0000 mL | Freq: Four times a day (QID) | RESPIRATORY_TRACT | Status: DC
  Administered 2021-06-15 – 2021-06-16 (×5): 3 mL via RESPIRATORY_TRACT
  Filled 2021-06-15 (×5): qty 3

## 2021-06-15 MED ORDER — PHYTONADIONE 5 MG PO TABS: 10.0000 mg | ORAL_TABLET | Freq: Once | ORAL | Status: DC

## 2021-06-15 MED ORDER — MOMETASONE FURO-FORMOTEROL FUM 200-5 MCG/ACT IN AERO
2.0000 | INHALATION_SPRAY | Freq: Two times a day (BID) | RESPIRATORY_TRACT | Status: DC
  Administered 2021-06-16: 2 via RESPIRATORY_TRACT
  Filled 2021-06-15: qty 8.8

## 2021-06-15 MED ORDER — SODIUM CHLORIDE 0.9 % IV BOLUS
500.0000 mL | Freq: Once | INTRAVENOUS | Status: AC
  Administered 2021-06-15: 500 mL via INTRAVENOUS

## 2021-06-15 MED ORDER — LEVOTHYROXINE SODIUM 25 MCG PO TABS
125.0000 ug | ORAL_TABLET | Freq: Every day | ORAL | Status: DC
  Administered 2021-06-16: 125 ug
  Filled 2021-06-15: qty 1

## 2021-06-15 MED ORDER — METOPROLOL SUCCINATE 50 MG PO CS24: 50.0000 mg | EXTENDED_RELEASE_CAPSULE | Freq: Every day | ORAL | Status: DC

## 2021-06-15 MED ORDER — VITAMIN K1 10 MG/ML IJ SOLN
2.5000 mg | Freq: Once | INTRAMUSCULAR | Status: AC
  Administered 2021-06-15: 2.5 mg via INTRAVENOUS
  Filled 2021-06-15: qty 0.25

## 2021-06-15 MED ORDER — FAMOTIDINE 10 MG PO TABS
10.0000 mg | ORAL_TABLET | Freq: Two times a day (BID) | ORAL | Status: DC
  Administered 2021-06-16: 10 mg
  Filled 2021-06-15 (×4): qty 1

## 2021-06-15 MED ORDER — METOPROLOL TARTRATE 25 MG PO TABS
25.0000 mg | ORAL_TABLET | Freq: Two times a day (BID) | ORAL | Status: DC
  Administered 2021-06-15 – 2021-06-16 (×3): 25 mg via ORAL
  Filled 2021-06-15 (×3): qty 1

## 2021-06-15 MED ORDER — PREDNISONE 20 MG PO TABS
40.0000 mg | ORAL_TABLET | Freq: Every day | ORAL | Status: DC
  Administered 2021-06-16: 40 mg via ORAL
  Filled 2021-06-15: qty 2

## 2021-06-15 MED ORDER — BUDESONIDE 0.5 MG/2ML IN SUSP
2.0000 mg | Freq: Two times a day (BID) | RESPIRATORY_TRACT | Status: DC
  Administered 2021-06-15 – 2021-06-16 (×2): 2 mg via RESPIRATORY_TRACT
  Filled 2021-06-15 (×2): qty 8

## 2021-06-15 MED ORDER — GUAIFENESIN 100 MG/5ML PO LIQD
30.0000 mL | Freq: Four times a day (QID) | ORAL | Status: DC
  Administered 2021-06-16 (×2): 30 mL via ORAL
  Filled 2021-06-15 (×3): qty 30

## 2021-06-15 MED ORDER — ALBUTEROL SULFATE (2.5 MG/3ML) 0.083% IN NEBU: 2.5000 mg | INHALATION_SOLUTION | RESPIRATORY_TRACT | Status: DC | PRN

## 2021-06-15 MED ORDER — NICOTINE 14 MG/24HR TD PT24
14.0000 mg | MEDICATED_PATCH | Freq: Every day | TRANSDERMAL | Status: DC
  Administered 2021-06-15 – 2021-06-16 (×2): 14 mg via TRANSDERMAL
  Filled 2021-06-15 (×2): qty 1

## 2021-06-15 MED ORDER — SODIUM CHLORIDE 0.9 % IV SOLN
500.0000 mg | INTRAVENOUS | Status: AC
  Administered 2021-06-15: 500 mg via INTRAVENOUS
  Filled 2021-06-15: qty 5

## 2021-06-15 MED ORDER — AZITHROMYCIN 500 MG PO TABS
500.0000 mg | ORAL_TABLET | Freq: Every day | ORAL | Status: DC
  Administered 2021-06-16: 500 mg via ORAL
  Filled 2021-06-15: qty 1

## 2021-06-15 MED ORDER — BENZONATATE 100 MG PO CAPS
200.0000 mg | ORAL_CAPSULE | Freq: Three times a day (TID) | ORAL | Status: DC
  Administered 2021-06-15 – 2021-06-16 (×3): 200 mg via ORAL
  Filled 2021-06-15 (×3): qty 2

## 2021-06-15 MED ORDER — GUAIFENESIN ER 600 MG PO TB12
1200.0000 mg | ORAL_TABLET | Freq: Two times a day (BID) | ORAL | Status: DC
  Administered 2021-06-15: 1200 mg via ORAL
  Filled 2021-06-15: qty 2

## 2021-06-15 NOTE — H&P (Signed)
History and Physical    Ronald [name] OZD:[id number] DOB: November 07, 1940 DOA: 06/15/2021  PCP: Corky Downs, MD (Confirm with patient/family/NH records and if not entered, this has to be entered at [facility] point of entry) Patient coming from: Home  I have personally briefly reviewed patient's old medical records in [facility]  Chief Complaint: I was sent.  HPI: Ronald [name] is a 81 y.o. male with medical history significant of chronic A-fib on Coumadin, COPD, CAD CABG, cigarette smoker, HTN, stroke with chronic dysarthria and aphasia, and dysphagia on pured diet, thyroidism, was sent from PCP office for evaluation of elevated INR.  Patient had a routine annual tach yesterday, this morning results showed INR> 10, patient denies any bleeding, no black tarry stool no abdominal pain.  Last week, patient started to have wheezing, cough and shortness of breath.  He has been using nebulizer around-the-clock.  He was diagnosed several years ago by PCP COPD, but never had a formal lung function test was evaluated by pulmonology.  He denies any chest pain, no fever or chills.  Occasionally cough up whitish sputum.  His appetite significantly affected in the last few days.  But denies any abdominal pain feeling nauseous vomiting or diarrhea.  Daughter reported the patient smokes occasionally at home.  ED Course: Monitor shows A-fib, blood pressure stable afebrile.  No hypoxia.  INR> 10, hemoglobin 14.2.  Guaiac test positive in the ED stool color is brown.  X-ray showed mild lung congestion.  proBNP 147  Review of Systems: As per HPI otherwise 14 point review of systems negative.    Past Medical History:  Diagnosis Date   A-fib [facility])    COPD (chronic obstructive pulmonary disease) (HCC)    Emphysema lung (HCC)    Hx of long term use of blood thinners    Hypertension    Stroke (HCC) 2013   Thyroid disease    hypothyroidism    Past Surgical History:  Procedure Laterality Date   CORONARY  ARTERY BYPASS GRAFT     IR ANGIO EXTRACRAN SEL COM CAROTID INNOMINATE UNI R MOD SED  06/19/2017   IR ANGIO VERTEBRAL SEL SUBCLAVIAN INNOMINATE UNI R MOD SED  06/19/2017   IR GASTROSTOMY TUBE MOD SED  07/04/2017   IR GASTROSTOMY TUBE REMOVAL  01/12/2018   RADIOLOGY WITH ANESTHESIA N/A 06/19/2017   Procedure: RADIOLOGY WITH ANESTHESIA;  Surgeon: Julieanne Cotton, MD;  Location: [facility] OR;  Service: Radiology;  Laterality: N/A;     reports that he quit smoking about 4 years ago. He has quit using smokeless tobacco. He reports that he does not currently use alcohol. He reports that he does not use drugs.  No Known Allergies  Family History  Problem Relation Age of Onset   Parkinson's disease Mother    Colon cancer Mother    Emphysema Father    Stroke Sister    Stroke Brother      Prior to Admission medications   Medication Sig Start Date End Date Taking? Authorizing Provider  atorvastatin (LIPITOR) 40 MG tablet TAKE 1 TABLET VIA FEEDING TUBE DAILY AT 6:00 PM 06/01/19   Corky Downs, MD  diclofenac sodium (VOLTAREN) 1 % GEL Apply 2 g topically 4 (four) times daily. 07/06/17   Angiulli, Mcarthur Rossetti, PA-C  famotidine (PEPCID AC) 10 MG chewable tablet Place 10 mg into feeding tube 2 (two) times daily.     [provider]  levothyroxine (SYNTHROID, LEVOTHROID) 125 MCG tablet Place 1 tablet (125 mcg total)  into feeding tube daily before breakfast. 07/07/17   Angiulli, Mcarthur Rossetti, PA-C  Metoprolol Succinate 50 MG CS24 Take by mouth.    [provider]  warfarin (COUMADIN) 5 MG tablet Take 1 tablet (5 mg total) by mouth daily after supper. Patient taking differently: Place 5 mg into feeding tube daily after supper. 5mg  mon-fri and 2.5mg  sat and sun 07/07/17   Angiulli, Mcarthur Rossetti, PA-C  Water For Irrigation, Sterile (FREE WATER) SOLN Place 200 mLs into feeding tube 4 (four) times daily. 07/06/17   Charlton Amor, PA-C    Physical Exam: Vitals:   06/15/21 1127 06/15/21 1224 06/15/21 1230  06/15/21 1409  BP:  126/60 122/70 115/63  Pulse:  61 (!) 109 93  Resp:  18 19 (!) 24  Temp:      TempSrc:      SpO2:  (!) 72% 100% 98%  Weight: 72.6 kg     Height: 5\' 10"  (1.778 m)       Constitutional: NAD, calm, comfortable Vitals:   06/15/21 1127 06/15/21 1224 06/15/21 1230 06/15/21 1409  BP:  126/60 122/70 115/63  Pulse:  61 (!) 109 93  Resp:  18 19 (!) 24  Temp:      TempSrc:      SpO2:  (!) 72% 100% 98%  Weight: 72.6 kg     Height: 5\' 10"  (1.778 m)      Eyes: PERRL, lids and conjunctivae normal ENMT: Mucous membranes are moist. Posterior pharynx clear of any exudate or lesions.Normal dentition.  Neck: normal, supple, no masses, no thyromegaly Respiratory: Breathing sounds bilaterally, diffuse wheezing, no crackles.  Increasing respiratory effort. No accessory muscle use.  Cardiovascular: Irregular heart rate, no murmurs / rubs / gallops. No extremity edema. 2+ pedal pulses. No carotid bruits.  Abdomen: no tenderness, no masses palpated. No hepatosplenomegaly. Bowel sounds positive.  Musculoskeletal: no clubbing / cyanosis. No joint deformity upper and lower extremities. Good ROM, no contractures. Normal muscle tone.  Skin: no rashes, lesions, ulcers. No induration Neurologic: CN 2-12 grossly intact. Sensation intact, DTR normal. Strength 5/5 in all 4.  Psychiatric: Normal judgment and insight. Alert and oriented x 3. Normal mood.     Labs on Admission: I have personally reviewed following labs and imaging studies  CBC: Recent Labs  Lab 06/15/21 1150  WBC 4.8  NEUTROABS 3.0  HGB 14.2  HCT 40.5  MCV 93.1  PLT 182   Basic Metabolic Panel: Recent Labs  Lab 06/15/21 1150  NA 138  K 4.3  CL 103  CO2 26  GLUCOSE 93  BUN 19  CREATININE 0.83  CALCIUM 9.4   GFR: Estimated Creatinine Clearance: 75.3 mL/min (by C-G formula based on SCr of 0.83 mg/dL). Liver Function Tests: Recent Labs  Lab 06/15/21 1150  AST 35  ALT 18  ALKPHOS 54  BILITOT 0.8  PROT  6.9  ALBUMIN 3.8   Recent Labs  Lab 06/15/21 1150  LIPASE 30   No results for input(s): AMMONIA in the last 168 hours. Coagulation Profile: Recent Labs  Lab 06/15/21 1150  INR >10.0*   Cardiac Enzymes: No results for input(s): CKTOTAL, CKMB, CKMBINDEX, TROPONINI in the last 168 hours. BNP (last 3 results) No results for input(s): PROBNP in the last 8760 hours. HbA1C: No results for input(s): HGBA1C in the last 72 hours. CBG: No results for input(s): GLUCAP in the last 168 hours. Lipid Profile: No results for input(s): CHOL, HDL, LDLCALC, TRIG, CHOLHDL, LDLDIRECT in the last 72 hours.  Thyroid Function Tests: No results for input(s): TSH, T4TOTAL, FREET4, T3FREE, THYROIDAB in the last 72 hours. Anemia Panel: No results for input(s): VITAMINB12, FOLATE, FERRITIN, TIBC, IRON, RETICCTPCT in the last 72 hours. Urine analysis:    Component Value Date/Time   COLORURINE YELLOW 06/20/2017 0338   APPEARANCEUR CLEAR 06/20/2017 0338   APPEARANCEUR Clear 07/15/2012 1719   LABSPEC 1.020 06/20/2017 0338   LABSPEC 1.015 07/15/2012 1719   PHURINE 5.0 06/20/2017 0338   GLUCOSEU NEGATIVE 06/20/2017 0338   GLUCOSEU Negative 07/15/2012 1719   HGBUR SMALL (A) 06/20/2017 0338   BILIRUBINUR NEGATIVE 06/20/2017 0338   BILIRUBINUR Negative 07/15/2012 1719   KETONESUR NEGATIVE 06/20/2017 0338   PROTEINUR NEGATIVE 06/20/2017 0338   NITRITE NEGATIVE 06/20/2017 0338   LEUKOCYTESUR NEGATIVE 06/20/2017 0338   LEUKOCYTESUR Negative 07/15/2012 1719    Radiological Exams on Admission: DG Chest 2 View  Result Date: 06/15/2021 CLINICAL DATA:  Lung crackles. EXAM: CHEST - 2 VIEW COMPARISON:  09/21/2017 FINDINGS: Previous median sternotomy and CABG procedure. Cardiac enlargement. Mild increase interstitial markings identified bilaterally concerning for interstitial edema. No airspace consolidation. IMPRESSION: Cardiac enlargement.  Suspect mild interstitial edema. Electronically Signed   By: Signa Kell M.D.   On: 06/15/2021 12:03    EKG: Independently reviewed.  Chronic A-fib, no acute ST changes  Assessment/Plan Principal Problem:   Coagulopathy (HCC)  (please populate well all problems here in Problem List. (For example, if patient is on BP meds at home and you resume or decide to hold them, it is a problem that needs to be her. Same for CAD, COPD, HLD and so on)  Coagulopathy -INR>10 but no significant overt bleeding.  IV vitamin K 2.5 mg x 1, p.o. with vitamin K x1 repeat INR tomorrow. -Takes Coumadin 2.5 mg Monday Wednesday Friday and 5 mg rest of week.  Recommend likely patient can go home tomorrow if continued level <5 and no bleeding, and check INR daily for 3 days to restart Coumadin.  Acute COPD exacerbation -No hypoxia, will check VBG to rule out CO2 retention -Short course of p.o. steroids, breathing treatment. -PCP follow-up and outpatient pulmonary follow-up for formal PFT. -Flutter valve, incentive spirometry.  Guaiac positive stool -Stool color is brown, no anemia, doubt GI bleed.  Repeat H&H tomorrow, if stable, recommend outpatient follow-up with GI.  Cigarette smoke -Cessation education  Hypothyroidism -Continue Synthroid  Chronic dysphagia -Continue pured diet.  CAD s/p CABG -Continue ASA, beta blocker and statin.  Chronic afib -Metoprolol for rate control, Coumadin on hold.  DVT prophylaxis: INR>10 Code Status: Full code Family Communication: Daughter at bedside Disposition Plan: Expect less than 2 midnight hospital stay  Consults called: NOne Admission status: Medsurg obs   Emeline General MD [facility] Pager [phone]  06/15/2021, 2:17 PM

## 2021-06-15 NOTE — ED Provider Notes (Signed)
[facility] DEPARTMENT Provider Note   CSN: [id number] Arrival date & time: 06/15/21  1105     History  Chief Complaint  Patient presents with   Abnormal Labs    Ronald [name] is a 81 y.o. male with past medical history significant for prior stroke x2, chronic A-fib, hypertension, who presents from PCP for INR of >10. He denies any changes knowing that he takes medication.  Daughter reports that he has had a decreased appetite since around Wednesday, general fatigue and malaise.  Patient denies any fever, chills, nausea, vomiting.  He has had some on and off stomach pain.  He endorses 1 episode of dark stool, but no bright red blood per rectum.  Patient denies any blood in his urine.  Daughter reports that he seems close to his baseline secondary to prior strokes.  Patient denies any headache, chest pain, shortness of breath.  HPI     Home Medications Prior to Admission medications   Medication Sig Start Date End Date Taking? Authorizing Provider  atorvastatin (LIPITOR) 40 MG tablet TAKE 1 TABLET VIA FEEDING TUBE DAILY AT 6:00 PM 06/01/19   Cletis Athens, MD  diclofenac sodium (VOLTAREN) 1 % GEL Apply 2 g topically 4 (four) times daily. 07/06/17   Angiulli, Lavon Paganini, PA-C  famotidine (PEPCID AC) 10 MG chewable tablet Place 10 mg into feeding tube 2 (two) times daily.     [provider]  levothyroxine (SYNTHROID, LEVOTHROID) 125 MCG tablet Place 1 tablet (125 mcg total) into feeding tube daily before breakfast. 07/07/17   Angiulli, Lavon Paganini, PA-C  Metoprolol Succinate 50 MG CS24 Take by mouth.    [provider]  warfarin (COUMADIN) 5 MG tablet Take 1 tablet (5 mg total) by mouth daily after supper. Patient taking differently: Place 5 mg into feeding tube daily after supper. 5mg  mon-fri and 2.5mg  sat and sun 07/07/17   Angiulli, Lavon Paganini, PA-C  Water For Irrigation, Sterile (FREE WATER) SOLN Place 200 mLs into feeding tube 4 (four) times daily.  07/06/17   Angiulli, Lavon Paganini, PA-C      Allergies    Patient has no known allergies.    Review of Systems   Review of Systems  Gastrointestinal:  Positive for abdominal pain.  All other systems reviewed and are negative.  Physical Exam Updated Vital Signs BP 115/63    Pulse 93    Temp (!) 97.4 F (36.3 C) (Oral)    Resp (!) 24    Ht 5\' 10"  (1.778 m)    Wt 72.6 kg    SpO2 98%    BMI 22.96 kg/m  Physical Exam Vitals and nursing note reviewed.  Constitutional:      General: He is not in acute distress.    Appearance: Normal appearance.     Comments: This is a thin, chronically ill-appearing man in no acute distress  HENT:     Head: Normocephalic and atraumatic.  Eyes:     General:        Right eye: No discharge.        Left eye: No discharge.  Cardiovascular:     Rate and Rhythm: Normal rate and regular rhythm.     Heart sounds: No murmur heard.   No friction rub. No gallop.  Pulmonary:     Effort: Pulmonary effort is normal.     Breath sounds: Normal breath sounds.     Comments: I do hear some scattered crackles in the right  lung base compared to the left.  They do clears somewhat with cough.  No wheezing, stridor, rales.  No respiratory distress noted. Abdominal:     General: Bowel sounds are normal.     Palpations: Abdomen is soft.     Comments: Minimal to no tenderness to palpation of the abdomen, no rebound, rigidity, guarding.  Normal bowel sounds throughout.  Musculoskeletal:     Comments: She does have some noted petechiae bilateral lower extremities, no open ulcers or active bleeding  Skin:    General: Skin is warm and dry.     Capillary Refill: Capillary refill takes less than 2 seconds.  Neurological:     Mental Status: He is alert. Mental status is at baseline.  Psychiatric:        Mood and Affect: Mood normal.        Behavior: Behavior normal.    ED Results / Procedures / Treatments   Labs (all labs ordered are listed, but only abnormal results are  displayed) Labs Reviewed  PROTIME-INR - Abnormal; Notable for the following components:      Result Value   Prothrombin Time >90.0 (*)    INR >10.0 (*)    All other components within normal limits  APTT - Abnormal; Notable for the following components:   aPTT 133 (*)    All other components within normal limits  BRAIN NATRIURETIC PEPTIDE - Abnormal; Notable for the following components:   B Natriuretic Peptide 182.7 (*)    All other components within normal limits  POC OCCULT BLOOD, ED - Abnormal; Notable for the following components:   Fecal Occult Bld POSITIVE (*)    All other components within normal limits  RESP PANEL BY RT-PCR (FLU A&B, COVID) ARPGX2  CBC WITH DIFFERENTIAL/PLATELET  COMPREHENSIVE METABOLIC PANEL  LIPASE, BLOOD  URINALYSIS, ROUTINE W REFLEX MICROSCOPIC  BLOOD GAS, VENOUS  TYPE AND SCREEN  ABO/RH    EKG EKG Interpretation  Date/Time:  Tuesday June 15 2021 11:34:09 EST Ventricular Rate:  103 PR Interval:    QRS Duration: 128 QT Interval:  362 QTC Calculation: 474 R Axis:   -56 Text Interpretation: Atrial fibrillation with rapid ventricular response Left axis deviation Non-specific intra-ventricular conduction block Minimal voltage criteria for LVH, may be normal variant ( Cornell product ) Abnormal ECG When compared with ECG of 15-Jul-2012 11:42, PREVIOUS ECG IS PRESENT Confirmed by Dene Gentry [phone]) on 06/15/2021 12:51:09 PM  Radiology DG Chest 2 View  Result Date: 06/15/2021 CLINICAL DATA:  Lung crackles. EXAM: CHEST - 2 VIEW COMPARISON:  09/21/2017 FINDINGS: Previous median sternotomy and CABG procedure. Cardiac enlargement. Mild increase interstitial markings identified bilaterally concerning for interstitial edema. No airspace consolidation. IMPRESSION: Cardiac enlargement.  Suspect mild interstitial edema. Electronically Signed   By: Kerby Moors M.D.   On: 06/15/2021 12:03    Procedures .Critical Care Performed by: Anselmo Pickler, PA-C Authorized by: Anselmo Pickler, PA-C   Critical care provider statement:    Critical care time (minutes):  35   Critical care time was exclusive of:  Separately billable procedures and treating other patients   Critical care was necessary to treat or prevent imminent or life-threatening deterioration of the following conditions:  Circulatory failure (Emergent reversal of warfarin, with bleeding noted)   Critical care was time spent personally by me on the following activities:  Development of treatment plan with patient or surrogate, discussions with consultants, evaluation of patient's response to treatment, examination of patient, ordering and review of  laboratory studies, ordering and review of radiographic studies, ordering and performing treatments and interventions, pulse oximetry, re-evaluation of patient's condition and review of old charts   Care discussed with: admitting provider      Medications Ordered in ED Medications  phytonadione (VITAMIN K) 2.5 mg in dextrose 5 % 50 mL IVPB (2.5 mg Intravenous New Bag/Given 06/15/21 1410)  phytonadione (VITAMIN K) tablet 10 mg (has no administration in time range)  atorvastatin (LIPITOR) tablet 40 mg (has no administration in time range)  Metoprolol Succinate CS24 50 mg (has no administration in time range)  levothyroxine (SYNTHROID) tablet 125 mcg (has no administration in time range)  famotidine (PEPCID AC) chewable tablet 10 mg (has no administration in time range)  azithromycin (ZITHROMAX) 500 mg in sodium chloride 0.9 % 250 mL IVPB (has no administration in time range)    Followed by  azithromycin (ZITHROMAX) tablet 500 mg (has no administration in time range)  budesonide (PULMICORT) nebulizer solution 2 mg (has no administration in time range)  predniSONE (DELTASONE) tablet 40 mg (has no administration in time range)  mometasone-formoterol (DULERA) 200-5 MCG/ACT inhaler 2 puff (has no administration in time range)   ipratropium-albuterol (DUONEB) 0.5-2.5 (3) MG/3ML nebulizer solution 3 mL (has no administration in time range)  albuterol (PROVENTIL) (2.5 MG/3ML) 0.083% nebulizer solution 2.5 mg (has no administration in time range)  guaiFENesin (MUCINEX) 12 hr tablet 1,200 mg (has no administration in time range)  benzonatate (TESSALON) capsule 200 mg (has no administration in time range)  nicotine (NICODERM CQ - dosed in mg/24 hours) patch 14 mg (has no administration in time range)  sodium chloride 0.9 % bolus 500 mL (500 mLs Intravenous New Bag/Given 06/15/21 1409)    ED Course/ Medical Decision Making/ A&P                           Medical Decision Making Amount and/or Complexity of Data Reviewed Labs: ordered. Radiology: ordered.   I discussed this case with my attending physician who cosigned this note including patient's presenting symptoms, physical exam, and planned diagnostics and interventions. Attending physician stated agreement with plan or made changes to plan which were implemented.   Attending physician assessed patient at bedside.  This patient presents to the ED for concern of supratherapeutic INR level, and generalized fatigue, as well as some dysuria for last few days, this involves an extensive number of treatment options, and is a complaint that carries with it a high risk of complications and morbidity. The emergent differential diagnosis prior to evaluation includes, but is not limited to, active uncontrolled GI bleed, subacute GI bleed, urinary tract infection, pyelonephritis, sepsis, coagulopathy.  This is not an exhaustive differential.   Past Medical History / Co-morbidities: Prior strokes, hypertension, chronic A-fib, COPD  Additional history: Additional history obtained from patient's daughter.   Physical Exam: Physical exam performed. The pertinent findings include: Chronically ill-appearing man, his Hemoccult was positive for blood, I did not see any extremely  melanic or gross blood in stool sample.  No evidence of hemorrhoids.  No significant tenderness to palpation abdomen, suprapubic region, chest.  He has been persistently in A-fib through the duration of his visit, in and out of mild tachycardia.  Lab Tests: I ordered, and personally interpreted labs.  The pertinent results include: INR greater than 10, Hemoccult positive, APTT 133.  CBC is unremarkable, his CMP is unremarkable.  Lipase is normal.  BNP, urinalysis pending at this time.  Imaging Studies: I ordered imaging studies including plain film chest x-ray. I independently visualized and interpreted imaging which showed some mild interstitial infiltration, no evidence of focal consolidation or other intrathoracic abnormality. I agree with the radiologist interpretation.   Cardiac Monitoring:  The patient was maintained on a cardiac monitor.  My attending physician Dr. Francia Greaves viewed and interpreted the cardiac monitored which showed an underlying rhythm of: Afib RVR   Medications: I ordered medication including vitamin K, bolus for Afib RVR, supratherapeutic INR with positive Hemoccult.   Consultations Obtained: I requested consultation with the Hospitalist regarding admission for INR > 10, concern for subacute lower GI bleed,  and discussed lab and imaging findings as well as pertinent plan - they recommend.  Attending Dr. Francia Greaves spoke to the admitting physician Dr. Roosevelt Locks who agrees to admission at this time.  Patient informed of plan.   At this time believe patient requires admission for supratherapeutic INR, Hemoccult positive stool, observation. Final Clinical Impression(s) / ED Diagnoses Final diagnoses:  None    Rx / DC Orders ED Discharge Orders     None         Dorien Chihuahua 06/15/21 1418    Valarie Merino, MD 06/16/21 1501

## 2021-06-15 NOTE — ED Triage Notes (Signed)
Patient arrives with daughter stating patient had labs drawn yesterday and INR greater then 10. Labs drawn at [facility] in [place]. Reports since Wednesday patient has not been feeling well and  loss of appetite.

## 2021-06-15 NOTE — ED Notes (Signed)
Daughter at bedside feeding pt dinner tray

## 2021-06-15 NOTE — ED Provider Triage Note (Signed)
Emergency Medicine Provider Triage Evaluation Note  CATRELL THORNBERRY , a 81 y.o. male  was evaluated in triage.  Pt complains of supratherapeutic INR, was greater than 10 at PCP yesterday.  Patient is also had some dysuria, UTI her results were not back for her PCP yesterday.  Patient has also had decreased appetite, occasional stomach pain.  Patient also endorses some dark stools although no blood in stool.  Patient denies any other bleeding.  Patient denies any chest pain, shortness of breath.  He has been taking his warfarin as normal but with decreased appetite, decreased intake of other food.  Review of Systems  Positive: Anorexia, INR issues, dysuria, stomach pain Negative: Nausea, vomiting, chest pain, SOB  Physical Exam  BP (!) 105/50 (BP Location: Left Arm)    Pulse (!) 126    Temp (!) 97.4 F (36.3 C) (Oral)    Resp 18    Ht 5\' 10"  (1.778 m)    Wt 72.6 kg    SpO2 100%    BMI 22.96 kg/m  Gen:   Awake, no distress   Resp:  Crackles noted right lung base, no respiratory distress MSK:   Moves extremities without difficulty  Other:  Irregularly irregular heart rate with tachycardia noted  Medical Decision Making  Medically screening exam initiated at 11:39 AM.  Appropriate orders placed.  MUSE BRINCEFIELD was informed that the remainder of the evaluation will be completed by another provider, this initial triage assessment does not replace that evaluation, and the importance of remaining in the ED until their evaluation is complete.  Workup initiated   Anselmo Pickler, [place] 06/15/21 1141

## 2021-06-15 NOTE — ED Notes (Signed)
Pt repeatedly removing cardiac monitoring wires and blood pressure cut. Reeducation on need for monitoring is not effective. Will continue to closely monitor patient and continue to reconnect monitoring cords.

## 2021-06-16 DIAGNOSIS — D689 Coagulation defect, unspecified: Secondary | ICD-10-CM | POA: Diagnosis not present

## 2021-06-16 LAB — CBC
HCT: 39.3 % (ref 39.0–52.0)
Hemoglobin: 13.5 g/dL (ref 13.0–17.0)
MCH: 32.1 pg (ref 26.0–34.0)
MCHC: 34.4 g/dL (ref 30.0–36.0)
MCV: 93.6 fL (ref 80.0–100.0)
Platelets: 156 10*3/uL (ref 150–400)
RBC: 4.2 MIL/uL — ABNORMAL LOW (ref 4.22–5.81)
RDW: 12.8 % (ref 11.5–15.5)
WBC: 6.7 10*3/uL (ref 4.0–10.5)
nRBC: 0 % (ref 0.0–0.2)

## 2021-06-16 LAB — PROTIME-INR
INR: 2.9 — ABNORMAL HIGH (ref 0.8–1.2)
INR: 2.5 — ABNORMAL HIGH (ref 0.8–1.2)
Prothrombin Time: 30.5 seconds — ABNORMAL HIGH (ref 11.4–15.2)
Prothrombin Time: 27 seconds — ABNORMAL HIGH (ref 11.4–15.2)

## 2021-06-16 LAB — ABO/RH: ABO/RH(D): A NEG

## 2021-06-16 MED ORDER — PREDNISONE 20 MG PO TABS: 20.0000 mg | ORAL_TABLET | Freq: Every day | ORAL | 0 refills | Status: AC

## 2021-06-16 MED ORDER — AZITHROMYCIN 500 MG PO TABS: 500.0000 mg | ORAL_TABLET | Freq: Every day | ORAL | 0 refills | Status: AC

## 2021-06-16 MED ORDER — BENZONATATE 200 MG PO CAPS: 200.0000 mg | ORAL_CAPSULE | Freq: Three times a day (TID) | ORAL | 0 refills | Status: AC

## 2021-06-16 MED ORDER — PANTOPRAZOLE SODIUM 40 MG PO TBEC: 40.0000 mg | DELAYED_RELEASE_TABLET | Freq: Every day | ORAL | 0 refills | Status: AC

## 2021-06-16 NOTE — Progress Notes (Signed)
°   06/16/21 1530  Clinical Encounter Type  Visited With Patient and family together (Advanced Directive)  Visit Type Initial  Referral From Nurse  Consult/Referral To None   Chaplain responded to a phone call about an advanced directive.   Valerie Roys Chaplain Resident  [facility]  [phone]

## 2021-06-16 NOTE — ED Notes (Signed)
Breakfast Orders Placed °

## 2021-06-16 NOTE — Discharge Summary (Signed)
Ronald [name] ZOX:[id number] DOB: May 11, 1940 DOA: 06/15/2021  PCP: Corky Downs, MD  Admit date: 06/15/2021 Discharge date: 06/16/2021  Admitted From: home Disposition:  home  Recommendations for Outpatient Follow-up:  Follow up with PCP in 1 week Please obtain BMP/CBC, iron in one week    Discharge Condition:Stable CODE STATUS: Full Diet recommendation: Heart Healthy  Brief/Interim Summary: Per WJX:BJYN L Ronald [name] is a 81 y.o. male with medical history significant of chronic A-fib on Coumadin, COPD, CAD CABG, cigarette smoker, HTN, stroke with chronic dysarthria and aphasia, and dysphagia on pured diet, thyroidism, was sent from PCP office for evaluation of elevated INR.   Patient had a routine annual tach yesterday, this morning results showed INR> 10, patient denies any bleeding, no black tarry stool no abdominal pain.   Last week, patient started to have wheezing, cough and shortness of breath.  He has been using nebulizer around-the-clock.  He was diagnosed several years ago by PCP COPD, but never had a formal lung function test was evaluated by pulmonology.  He denies any chest pain, no fever or chills.  Occasionally cough up whitish sputum.  His appetite significantly affected in the last few days.  But denies any abdominal pain feeling nauseous vomiting or diarrhea.  Daughter reported the patient smokes occasionally at home.   ED Course: Monitor shows A-fib, blood pressure stable afebrile.  No hypoxia.   INR> 10, hemoglobin 14.2.  Guaiac test positive in the ED stool color is brown.  X-ray showed mild lung congestion.  proBNP 147  He was given K.  His INR remained stable in a.m.  No bleeding.  He was stable to be discharged home.   Coagulopathy -INR>10 but no significant overt bleeding Given vitamin K INR improved     Acute COPD exacerbation -Short course of p.o. steroids taper was initiated, breathing treatment. -PCP follow-up and outpatient pulmonary follow-up for formal  PFT. -Flutter valve, incentive spirometry.   Guaiac positive stool H/h was stable. Declined GI evaluation/cscope/egd.   Cigarette smoke -Cessation education   Hypothyroidism -Continue Synthroid   Chronic dysphagia -Continue pured diet.   CAD s/p CABG Continue home meds   Chronic afib On metoprolol and coumadin    Discharge Diagnoses:  Principal Problem:   Coagulopathy [facility])    Discharge Instructions  Discharge Instructions     Diet - low sodium heart healthy   Complete by: As directed    Discharge instructions   Complete by: As directed    F/u with pcp this week, for inr check and dosing of coumadin if he is not eating much   Increase activity slowly   Complete by: As directed       Allergies as of 06/16/2021   No Known Allergies      Medication List     STOP taking these medications    BC FAST PAIN RELIEF PO   famotidine 10 MG chewable tablet Commonly known as: PEPCID AC   ibuprofen 200 MG tablet Commonly known as: ADVIL       TAKE these medications    acetaminophen 650 MG CR tablet Commonly known as: TYLENOL Take 1,300 mg by mouth every 8 (eight) hours as needed for pain.   atorvastatin 40 MG tablet Commonly known as: LIPITOR TAKE 1 TABLET VIA FEEDING TUBE DAILY AT 6:00 PM What changed: See the new instructions.   azithromycin 500 MG tablet Commonly known as: ZITHROMAX Take 1 tablet (500 mg total) by mouth daily for 4 days. Start taking on:  June 17, 2021   benzonatate 200 MG capsule Commonly known as: TESSALON Take 1 capsule (200 mg total) by mouth 3 (three) times daily for 5 days.   cholecalciferol 25 MCG (1000 UNIT) tablet Commonly known as: VITAMIN D Take 1,000 Units by mouth daily after supper.   cyclobenzaprine 5 MG tablet Commonly known as: FLEXERIL Take 5 mg by mouth at bedtime.   diclofenac sodium 1 % Gel Commonly known as: VOLTAREN Apply 2 g topically 4 (four) times daily.   free water Soln Place 200 mLs  into feeding tube 4 (four) times daily.   ipratropium-albuterol 0.5-2.5 (3) MG/3ML Soln Commonly known as: DUONEB Take 3 mLs by nebulization as needed (shortness of breath).   levothyroxine 125 MCG tablet Commonly known as: SYNTHROID Place 1 tablet (125 mcg total) into feeding tube daily before breakfast.   Metoprolol Succinate 50 MG Cs24 Take 50 mg by mouth daily.   pantoprazole 40 MG tablet Commonly known as: Protonix Take 1 tablet (40 mg total) by mouth daily.   polyvinyl alcohol 1.4 % ophthalmic solution Commonly known as: LIQUIFILM TEARS Place 1 drop into both eyes at bedtime.   predniSONE 20 MG tablet Commonly known as: DELTASONE Take 1 tablet (20 mg total) by mouth daily with breakfast for 3 days. Start taking on: June 17, 2021   REDNESS RELIEF OP Place 1 drop into the right eye as needed (Redness).   warfarin 5 MG tablet Commonly known as: COUMADIN Take 1 tablet (5 mg total) by mouth daily after supper. What changed: additional instructions        Follow-up Information     Corky Downs, MD Follow up in 1 day(s).   Specialties: Internal Medicine, Cardiology Why: 1-2 days, needs inr check and coumadin dosing Contact information: [address] [place] [address] [phone]                No Known Allergies  Consultations:    Procedures/Studies: DG Chest 2 View  Result Date: 06/15/2021 CLINICAL DATA:  Lung crackles. EXAM: CHEST - 2 VIEW COMPARISON:  09/21/2017 FINDINGS: Previous median sternotomy and CABG procedure. Cardiac enlargement. Mild increase interstitial markings identified bilaterally concerning for interstitial edema. No airspace consolidation. IMPRESSION: Cardiac enlargement.  Suspect mild interstitial edema. Electronically Signed   By: Signa Kell M.D.   On: 06/15/2021 12:03      Subjective: No sob. No cp, no obvious bleed  Discharge Exam: Vitals:   06/16/21 0815 06/16/21 1236  BP: (!) 142/88   Pulse: 82  85  Resp: 20 20  Temp: 98.8 F (37.1 C) 98.6 F (37 C)  SpO2: 93% 99%   Vitals:   06/16/21 0634 06/16/21 0645 06/16/21 0815 06/16/21 1236  BP: 122/68 116/68 (!) 142/88   Pulse: 77 82 82 85  Resp:  20 20 20   Temp:   98.8 F (37.1 C) 98.6 F (37 C)  TempSrc:   Oral Oral  SpO2: 94% 95% 93% 99%  Weight:      Height:        General: Pt is alert, awake, not in acute distress Cardiovascular: RRR, S1/S2 +, no rubs, no gallops Respiratory: CTA bilaterally, no wheezing, no rhonchi Abdominal: Soft, NT, ND, bowel sounds + Extremities: no edema, no cyanosis    The results of significant diagnostics from this hospitalization (including imaging, microbiology, ancillary and laboratory) are listed below for reference.     Microbiology: Recent Results (from the past 240 hour(s))  Resp Panel by RT-PCR (Flu  A&B, Covid) Nasopharyngeal Swab     Status: Abnormal   Collection Time: 06/15/21  1:22 PM   Specimen: Nasopharyngeal Swab; Nasopharyngeal(NP) swabs in vial transport medium  Result Value Ref Range Status   SARS Coronavirus 2 by RT PCR NEGATIVE NEGATIVE Final    Comment: (NOTE) SARS-CoV-2 target nucleic acids are NOT DETECTED.  The SARS-CoV-2 RNA is generally detectable in upper respiratory specimens during the acute phase of infection. The lowest concentration of SARS-CoV-2 viral copies this assay can detect is 138 copies/mL. A negative result does not preclude SARS-Cov-2 infection and should not be used as the sole basis for treatment or other patient management decisions. A negative result may occur with  improper specimen collection/handling, submission of specimen other than nasopharyngeal swab, presence of viral mutation(s) within the areas targeted by this assay, and inadequate number of viral copies(<138 copies/mL). A negative result must be combined with clinical observations, patient history, and epidemiological information. The expected result is Negative.  Fact Sheet  for Patients:  BloggerCourse.com  Fact Sheet for Healthcare Providers:  SeriousBroker.it  This test is no t yet approved or cleared by the [place] FDA and  has been authorized for detection and/or diagnosis of SARS-CoV-2 by FDA under an Emergency Use Authorization (EUA). This EUA will remain  in effect (meaning this test can be used) for the duration of the COVID-19 declaration under Section 564(b)(1) of the Act, 21 U.S.C.section 360bbb-3(b)(1), unless the authorization is terminated  or revoked sooner.       Influenza A by PCR POSITIVE (A) NEGATIVE Final   Influenza B by PCR NEGATIVE NEGATIVE Final    Comment: (NOTE) The Xpert Xpress SARS-CoV-2/FLU/RSV plus assay is intended as an aid in the diagnosis of influenza from Nasopharyngeal swab specimens and should not be used as a sole basis for treatment. Nasal washings and aspirates are unacceptable for Xpert Xpress SARS-CoV-2/FLU/RSV testing.  Fact Sheet for Patients: BloggerCourse.com  Fact Sheet for Healthcare Providers: SeriousBroker.it  This test is not yet approved or cleared by the [place] FDA and has been authorized for detection and/or diagnosis of SARS-CoV-2 by FDA under an Emergency Use Authorization (EUA). This EUA will remain in effect (meaning this test can be used) for the duration of the COVID-19 declaration under Section 564(b)(1) of the Act, 21 U.S.C. section 360bbb-3(b)(1), unless the authorization is terminated or revoked.  Performed at [facility], [address], [place], [place] [address]      Labs: BNP (last 3 results) Recent Labs    06/15/21 1158  BNP 182.7*   Basic Metabolic Panel: Recent Labs  Lab 06/15/21 1150 06/15/21 1547  NA 138 140  K 4.3 4.1  CL 103  --   CO2 26  --   GLUCOSE 93  --   BUN 19  --   CREATININE 0.83  --   CALCIUM 9.4  --    Liver Function  Tests: Recent Labs  Lab 06/15/21 1150  AST 35  ALT 18  ALKPHOS 54  BILITOT 0.8  PROT 6.9  ALBUMIN 3.8   Recent Labs  Lab 06/15/21 1150  LIPASE 30   No results for input(s): AMMONIA in the last 168 hours. CBC: Recent Labs  Lab 06/15/21 1150 06/15/21 1547 06/16/21 0502  WBC 4.8  --  6.7  NEUTROABS 3.0  --   --   HGB 14.2 11.9* 13.5  HCT 40.5 35.0* 39.3  MCV 93.1  --  93.6  PLT 182  --  156   Cardiac Enzymes: No results for input(s): CKTOTAL, CKMB, CKMBINDEX, TROPONINI in the last 168 hours. BNP: Invalid input(s): POCBNP CBG: No results for input(s): GLUCAP in the last 168 hours. D-Dimer No results for input(s): DDIMER in the last 72 hours. Hgb A1c No results for input(s): HGBA1C in the last 72 hours. Lipid Profile No results for input(s): CHOL, HDL, LDLCALC, TRIG, CHOLHDL, LDLDIRECT in the last 72 hours. Thyroid function studies No results for input(s): TSH, T4TOTAL, T3FREE, THYROIDAB in the last 72 hours.  Invalid input(s): FREET3 Anemia work up No results for input(s): VITAMINB12, FOLATE, FERRITIN, TIBC, IRON, RETICCTPCT in the last 72 hours. Urinalysis    Component Value Date/Time   COLORURINE YELLOW 06/15/2021 1443   APPEARANCEUR CLEAR 06/15/2021 1443   APPEARANCEUR Clear 07/15/2012 1719   LABSPEC 1.025 06/15/2021 1443   LABSPEC 1.015 07/15/2012 1719   PHURINE 5.0 06/15/2021 1443   GLUCOSEU NEGATIVE 06/15/2021 1443   GLUCOSEU Negative 07/15/2012 1719   HGBUR NEGATIVE 06/15/2021 1443   BILIRUBINUR NEGATIVE 06/15/2021 1443   BILIRUBINUR Negative 07/15/2012 1719   KETONESUR 5 (A) 06/15/2021 1443   PROTEINUR NEGATIVE 06/15/2021 1443   NITRITE NEGATIVE 06/15/2021 1443   LEUKOCYTESUR NEGATIVE 06/15/2021 1443   LEUKOCYTESUR Negative 07/15/2012 1719   Sepsis Labs Invalid input(s): PROCALCITONIN,  WBC,  LACTICIDVEN Microbiology Recent Results (from the past 240 hour(s))  Resp Panel by RT-PCR (Flu A&B, Covid) Nasopharyngeal Swab     Status: Abnormal    Collection Time: 06/15/21  1:22 PM   Specimen: Nasopharyngeal Swab; Nasopharyngeal(NP) swabs in vial transport medium  Result Value Ref Range Status   SARS Coronavirus 2 by RT PCR NEGATIVE NEGATIVE Final    Comment: (NOTE) SARS-CoV-2 target nucleic acids are NOT DETECTED.  The SARS-CoV-2 RNA is generally detectable in upper respiratory specimens during the acute phase of infection. The lowest concentration of SARS-CoV-2 viral copies this assay can detect is 138 copies/mL. A negative result does not preclude SARS-Cov-2 infection and should not be used as the sole basis for treatment or other patient management decisions. A negative result may occur with  improper specimen collection/handling, submission of specimen other than nasopharyngeal swab, presence of viral mutation(s) within the areas targeted by this assay, and inadequate number of viral copies(<138 copies/mL). A negative result must be combined with clinical observations, patient history, and epidemiological information. The expected result is Negative.  Fact Sheet for Patients:  BloggerCourse.com  Fact Sheet for Healthcare Providers:  SeriousBroker.it  This test is no t yet approved or cleared by the [place] FDA and  has been authorized for detection and/or diagnosis of SARS-CoV-2 by FDA under an Emergency Use Authorization (EUA). This EUA will remain  in effect (meaning this test can be used) for the duration of the COVID-19 declaration under Section 564(b)(1) of the Act, 21 U.S.C.section 360bbb-3(b)(1), unless the authorization is terminated  or revoked sooner.       Influenza A by PCR POSITIVE (A) NEGATIVE Final   Influenza B by PCR NEGATIVE NEGATIVE Final    Comment: (NOTE) The Xpert Xpress SARS-CoV-2/FLU/RSV plus assay is intended as an aid in the diagnosis of influenza from Nasopharyngeal swab specimens and should not be used as a sole basis for treatment.  Nasal washings and aspirates are unacceptable for Xpert Xpress SARS-CoV-2/FLU/RSV testing.  Fact Sheet for Patients: BloggerCourse.com  Fact Sheet for Healthcare Providers: SeriousBroker.it  This test is not yet approved or cleared by the [place] FDA and has been authorized for detection and/or  diagnosis of SARS-CoV-2 by FDA under an Emergency Use Authorization (EUA). This EUA will remain in effect (meaning this test can be used) for the duration of the COVID-19 declaration under Section 564(b)(1) of the Act, 21 U.S.C. section 360bbb-3(b)(1), unless the authorization is terminated or revoked.  Performed at [facility], [address], [place], [place] [address]      Time coordinating discharge: Over 30 minutes  SIGNED:   Lynn Ito, MD  [facility] 06/16/2021, 2:55 PM Pager   If 7PM-7AM, please contact night-coverage [email or web address] Password [email or web address]

## 2021-07-28 ENCOUNTER — Other Ambulatory Visit: Payer: Self-pay | Admitting: Family Medicine

## 2021-07-28 DIAGNOSIS — R102 Pelvic and perineal pain: Secondary | ICD-10-CM

## 2022-02-15 ENCOUNTER — Other Ambulatory Visit: Payer: Self-pay | Admitting: Family Medicine

## 2022-02-15 ENCOUNTER — Ambulatory Visit
Admission: RE | Admit: 2022-02-15 | Discharge: 2022-02-15 | Disposition: A | Discharge status: Home / Self Care | Payer: Medicare Other | Source: Ambulatory Visit | Attending: Family Medicine | Admitting: Family Medicine

## 2022-02-15 ENCOUNTER — Ambulatory Visit
Admission: RE | Admit: 2022-02-15 | Discharge: 2022-02-15 | Disposition: A | Discharge status: Home / Self Care | Payer: Medicare Other | Attending: Family Medicine | Admitting: Family Medicine

## 2022-02-15 DIAGNOSIS — R0602 Shortness of breath: Secondary | ICD-10-CM

## 2022-02-15 DIAGNOSIS — M545 Low back pain, unspecified: Secondary | ICD-10-CM | POA: Insufficient documentation

## 2022-02-15 DIAGNOSIS — G8929 Other chronic pain: Secondary | ICD-10-CM

## 2022-02-15 DIAGNOSIS — R52 Pain, unspecified: Secondary | ICD-10-CM

## 2022-07-18 ENCOUNTER — Emergency Department (HOSPITAL_COMMUNITY): Payer: Medicare Other

## 2022-07-18 ENCOUNTER — Inpatient Hospital Stay (HOSPITAL_COMMUNITY): Payer: Medicare Other

## 2022-07-18 ENCOUNTER — Other Ambulatory Visit: Payer: Self-pay

## 2022-07-18 ENCOUNTER — Encounter (HOSPITAL_COMMUNITY): Payer: Self-pay | Admitting: Emergency Medicine

## 2022-07-18 ENCOUNTER — Inpatient Hospital Stay (HOSPITAL_COMMUNITY)
Admission: EM | Admit: 2022-07-18 | Discharge: 2022-08-01 | DRG: 377 | Disposition: E | Payer: Medicare Other | Attending: Internal Medicine | Admitting: Internal Medicine

## 2022-07-18 DIAGNOSIS — J440 Chronic obstructive pulmonary disease with acute lower respiratory infection: Secondary | ICD-10-CM | POA: Diagnosis present

## 2022-07-18 DIAGNOSIS — R001 Bradycardia, unspecified: Secondary | ICD-10-CM | POA: Diagnosis present

## 2022-07-18 DIAGNOSIS — Z888 Allergy status to other drugs, medicaments and biological substances status: Secondary | ICD-10-CM

## 2022-07-18 DIAGNOSIS — Z1152 Encounter for screening for COVID-19: Secondary | ICD-10-CM | POA: Diagnosis not present

## 2022-07-18 DIAGNOSIS — Z66 Do not resuscitate: Secondary | ICD-10-CM | POA: Diagnosis present

## 2022-07-18 DIAGNOSIS — I69320 Aphasia following cerebral infarction: Secondary | ICD-10-CM | POA: Diagnosis not present

## 2022-07-18 DIAGNOSIS — J189 Pneumonia, unspecified organism: Secondary | ICD-10-CM | POA: Diagnosis present

## 2022-07-18 DIAGNOSIS — Z7901 Long term (current) use of anticoagulants: Secondary | ICD-10-CM

## 2022-07-18 DIAGNOSIS — D62 Acute posthemorrhagic anemia: Secondary | ICD-10-CM | POA: Diagnosis present

## 2022-07-18 DIAGNOSIS — I509 Heart failure, unspecified: Secondary | ICD-10-CM | POA: Diagnosis present

## 2022-07-18 DIAGNOSIS — I482 Chronic atrial fibrillation, unspecified: Secondary | ICD-10-CM | POA: Diagnosis present

## 2022-07-18 DIAGNOSIS — K922 Gastrointestinal hemorrhage, unspecified: Secondary | ICD-10-CM | POA: Diagnosis present

## 2022-07-18 DIAGNOSIS — J449 Chronic obstructive pulmonary disease, unspecified: Secondary | ICD-10-CM | POA: Diagnosis present

## 2022-07-18 DIAGNOSIS — R0902 Hypoxemia: Secondary | ICD-10-CM | POA: Diagnosis present

## 2022-07-18 DIAGNOSIS — Z79899 Other long term (current) drug therapy: Secondary | ICD-10-CM

## 2022-07-18 DIAGNOSIS — D649 Anemia, unspecified: Principal | ICD-10-CM

## 2022-07-18 LAB — CBC WITH DIFFERENTIAL/PLATELET
Abs Immature Granulocytes: 0.14 10*3/uL — ABNORMAL HIGH (ref 0.00–0.07)
Basophils Absolute: 0 10*3/uL (ref 0.0–0.1)
Basophils Relative: 0 %
Eosinophils Absolute: 0 10*3/uL (ref 0.0–0.5)
Eosinophils Relative: 0 %
HCT: 13.2 % — ABNORMAL LOW (ref 39.0–52.0)
Hemoglobin: 4 g/dL — CL (ref 13.0–17.0)
Immature Granulocytes: 1 %
Lymphocytes Relative: 5 %
Lymphs Abs: 0.5 10*3/uL — ABNORMAL LOW (ref 0.7–4.0)
MCH: 31.3 pg (ref 26.0–34.0)
MCHC: 30.3 g/dL (ref 30.0–36.0)
MCV: 103.1 fL — ABNORMAL HIGH (ref 80.0–100.0)
Monocytes Absolute: 0.6 10*3/uL (ref 0.1–1.0)
Monocytes Relative: 5 %
Neutro Abs: 9.7 10*3/uL — ABNORMAL HIGH (ref 1.7–7.7)
Neutrophils Relative %: 89 %
Platelets: 312 10*3/uL (ref 150–400)
RBC: 1.28 MIL/uL — ABNORMAL LOW (ref 4.22–5.81)
RDW: 16.4 % — ABNORMAL HIGH (ref 11.5–15.5)
WBC: 11 10*3/uL — ABNORMAL HIGH (ref 4.0–10.5)
nRBC: 0.6 % — ABNORMAL HIGH (ref 0.0–0.2)

## 2022-07-18 LAB — BASIC METABOLIC PANEL
Anion gap: 10 (ref 5–15)
BUN: 37 mg/dL — ABNORMAL HIGH (ref 8–23)
CO2: 21 mmol/L — ABNORMAL LOW (ref 22–32)
Calcium: 8.6 mg/dL — ABNORMAL LOW (ref 8.9–10.3)
Chloride: 109 mmol/L (ref 98–111)
Creatinine, Ser: 1.04 mg/dL (ref 0.61–1.24)
GFR, Estimated: >60 mL/min (ref >60)
Glucose, Bld: 139 mg/dL — ABNORMAL HIGH (ref 70–99)
Potassium: 4.2 mmol/L (ref 3.5–5.1)
Sodium: 140 mmol/L (ref 135–145)

## 2022-07-18 LAB — BRAIN NATRIURETIC PEPTIDE: B Natriuretic Peptide: 121 pg/mL — ABNORMAL HIGH (ref 0.0–100.0)

## 2022-07-18 LAB — PROTIME-INR
INR: 3.4 — ABNORMAL HIGH (ref 0.8–1.2)
Prothrombin Time: 34.2 seconds — ABNORMAL HIGH (ref 11.4–15.2)

## 2022-07-18 LAB — RESP PANEL BY RT-PCR (RSV, FLU A&B, COVID)  RVPGX2
Influenza A by PCR: NEGATIVE
Influenza B by PCR: NEGATIVE
Resp Syncytial Virus by PCR: NEGATIVE
SARS Coronavirus 2 by RT PCR: NEGATIVE

## 2022-07-18 LAB — POC OCCULT BLOOD, ED: Fecal Occult Bld: POSITIVE — AB

## 2022-07-18 LAB — PREPARE RBC (CROSSMATCH)

## 2022-07-18 MED ORDER — IOHEXOL 300 MG/ML  SOLN
100.0000 mL | Freq: Once | INTRAMUSCULAR | Status: AC | PRN
  Administered 2022-07-18: 100 mL via INTRAVENOUS

## 2022-07-18 MED ORDER — ONDANSETRON HCL 4 MG/2ML IJ SOLN: 4.0000 mg | Freq: Four times a day (QID) | INTRAMUSCULAR | Status: DC | PRN

## 2022-07-18 MED ORDER — METOPROLOL SUCCINATE ER 50 MG PO TB24: 50.0000 mg | ORAL_TABLET | Freq: Every day | ORAL | Status: DC

## 2022-07-18 MED ORDER — ONDANSETRON HCL 4 MG/2ML IJ SOLN
4.0000 mg | Freq: Once | INTRAMUSCULAR | Status: AC
  Administered 2022-07-18: 4 mg via INTRAVENOUS
  Filled 2022-07-18: qty 2

## 2022-07-18 MED ORDER — PANTOPRAZOLE SODIUM 40 MG IV SOLR
40.0000 mg | Freq: Once | INTRAVENOUS | Status: AC
  Administered 2022-07-18: 40 mg via INTRAVENOUS
  Filled 2022-07-18: qty 10

## 2022-07-18 MED ORDER — ACETAMINOPHEN 325 MG PO TABS: 650.0000 mg | ORAL_TABLET | Freq: Four times a day (QID) | ORAL | Status: DC | PRN

## 2022-07-18 MED ORDER — ATORVASTATIN CALCIUM 10 MG PO TABS: 20.0000 mg | ORAL_TABLET | Freq: Every day | ORAL | Status: DC

## 2022-07-18 MED ORDER — POLYVINYL ALCOHOL 1.4 % OP SOLN: 1.0000 [drp] | Freq: Every day | OPHTHALMIC | Status: DC

## 2022-07-18 MED ORDER — IPRATROPIUM-ALBUTEROL 0.5-2.5 (3) MG/3ML IN SOLN: 3.0000 mL | RESPIRATORY_TRACT | Status: DC | PRN

## 2022-07-18 MED ORDER — ONDANSETRON HCL 4 MG PO TABS: 4.0000 mg | ORAL_TABLET | Freq: Four times a day (QID) | ORAL | Status: DC | PRN

## 2022-07-18 MED ORDER — ACETAMINOPHEN 650 MG RE SUPP: 650.0000 mg | Freq: Four times a day (QID) | RECTAL | Status: DC | PRN

## 2022-07-18 MED ORDER — SODIUM CHLORIDE 0.9% IV SOLUTION: Freq: Once | INTRAVENOUS | Status: DC

## 2022-07-18 MED ORDER — PANTOPRAZOLE SODIUM 40 MG IV SOLR: 40.0000 mg | Freq: Two times a day (BID) | INTRAVENOUS | Status: DC

## 2022-07-21 LAB — TYPE AND SCREEN
ABO/RH(D): A NEG
Antibody Screen: NEGATIVE
Unit division: 0
Unit division: 0

## 2022-07-21 LAB — BPAM RBC
Blood Product Expiration Date: 202403282359
Blood Product Expiration Date: 202403282359
ISSUE DATE / TIME: 202403181039
Unit Type and Rh: 600
Unit Type and Rh: 600

## 2022-08-01 NOTE — ED Notes (Signed)
Pt's daughter at bedside states that he has been previously intubated before and pt does not want any invasive life sustaining measures,such as intubation, CPR, etc. However, pt will accept blood products

## 2022-08-01 NOTE — ED Provider Notes (Signed)
[facility] Provider Note   CSN: [id number] Arrival date & time: 07-26-2022  [id number]     History  Chief Complaint  Patient presents with   Shortness of [place] is a 82 y.o. male.   Shortness of Breath Patient reported sent in for shortness of breath.  Came in by EMS.  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  Reportedly sats of 85% at home.  Had DuoNebs at home.  Reported history of A-fib COPD and CHF.  Appears to be chronic in A-fib.  Reviewed cardiology note.  Patient is somewhat pale.  Denies coughing.  Is improved on nasal cannula oxygen.     Home Medications Prior to Admission medications   Medication Sig Start Date End Date Taking? Authorizing Provider  acetaminophen (TYLENOL) 650 MG CR tablet Take 1,300 mg by mouth every 8 (eight) hours as needed for pain.   Yes [provider]  atorvastatin (LIPITOR) 20 MG tablet Take 20 mg by mouth daily.   Yes [provider]  cholecalciferol (VITAMIN D) 25 MCG (1000 UNIT) tablet Take 1,000 Units by mouth daily after supper. 05/25/21  Yes [provider]  cyclobenzaprine (FLEXERIL) 5 MG tablet Take 5 mg by mouth 3 (three) times daily as needed for muscle spasms. 05/25/21  Yes [provider]  ipratropium-albuterol (DUONEB) 0.5-2.5 (3) MG/3ML SOLN Take 3 mLs by nebulization as needed (shortness of breath). 06/10/21  Yes [provider]  metoprolol succinate (TOPROL-XL) 50 MG 24 hr tablet Take 50 mg by mouth daily. 06/24/22  Yes [provider]  Naphazoline-Glycerin (REDNESS RELIEF OP) Place 1 drop into the right eye as needed (Redness).   Yes [provider]  pantoprazole (PROTONIX) 40 MG tablet Take 1 tablet (40 mg total) by mouth daily. 06/16/21 07/26/2022 Yes Nolberto Hanlon, MD  polyvinyl alcohol (LIQUIFILM TEARS) 1.4 % ophthalmic solution Place 1 drop into both eyes at bedtime. 02/19/21  Yes  [provider]  XARELTO 20 MG TABS tablet Take 20 mg by mouth daily.   Yes [provider]  diclofenac sodium (VOLTAREN) 1 % GEL Apply 2 g topically 4 (four) times daily. Patient not taking: Reported on 2022/07/26 07/06/17   Angiulli, Lavon Paganini, PA-C  Water For Irrigation, Sterile (FREE WATER) SOLN Place 200 mLs into feeding tube 4 (four) times daily. Patient not taking: Reported on 06/15/2021 07/06/17   Angiulli, Lavon Paganini, PA-C      Allergies    Lisinopril    Review of Systems   Review of Systems  Respiratory:  Positive for shortness of breath.     Physical Exam Updated Vital Signs BP 138/69 (BP Location: Right Arm)   Pulse 98   Temp (!) 97.4 F (36.3 C) (Oral)   Resp (!) 30   SpO2 97%  Physical Exam Vitals and nursing note reviewed.  HENT:     Head: Normocephalic.  Cardiovascular:     Rate and Rhythm: Rhythm irregular.  Pulmonary:     Breath sounds: No wheezing, rhonchi or rales.  Musculoskeletal:     Left lower leg: No edema.  Skin:    General: Skin is warm.  Neurological:     Mental Status: He is alert.     Comments: Mostly aphasic.  Will answer some yes or no questions.     ED Results / Procedures / Treatments   Labs (all labs ordered are listed, but only abnormal  results are displayed) Labs Reviewed  CBC WITH DIFFERENTIAL/PLATELET - Abnormal; Notable for the following components:      Result Value   WBC 11.0 (*)    RBC 1.28 (*)    Hemoglobin 4.0 (*)    HCT 13.2 (*)    MCV 103.1 (*)    RDW 16.4 (*)    nRBC 0.6 (*)    Neutro Abs 9.7 (*)    Lymphs Abs 0.5 (*)    Abs Immature Granulocytes 0.14 (*)    All other components within normal limits  BASIC METABOLIC PANEL - Abnormal; Notable for the following components:   CO2 21 (*)    Glucose, Bld 139 (*)    BUN 37 (*)    Calcium 8.6 (*)    All other components within normal limits  PROTIME-INR - Abnormal; Notable for the following components:   Prothrombin Time 34.2 (*)    INR 3.4 (*)     All other components within normal limits  BRAIN NATRIURETIC PEPTIDE - Abnormal; Notable for the following components:   B Natriuretic Peptide 121.0 (*)    All other components within normal limits  POC OCCULT BLOOD, ED - Abnormal; Notable for the following components:   Fecal Occult Bld POSITIVE (*)    All other components within normal limits  RESP PANEL BY RT-PCR (RSV, FLU A&B, COVID)  RVPGX2  TYPE AND SCREEN    EKG None  Radiology CT ABDOMEN PELVIS W CONTRAST  Result Date: 08/08/22 CLINICAL DATA:  Right lower quadrant abdominal pain EXAM: CT ABDOMEN AND PELVIS WITH CONTRAST TECHNIQUE: Multidetector CT imaging of the abdomen and pelvis was performed using the standard protocol following bolus administration of intravenous contrast. RADIATION DOSE REDUCTION: This exam was performed according to the departmental dose-optimization program which includes automated exposure control, adjustment of the mA and/or kV according to patient size and/or use of iterative reconstruction technique. CONTRAST:  1108mL OMNIPAQUE IOHEXOL 300 MG/ML  SOLN COMPARISON:  06/29/2017 FINDINGS: Lower chest: Cardiomegaly with coronary artery atherosclerosis. Patchy dependent airspace consolidation within the lingula and bilateral lower lobes. Trace bilateral pleural effusions. Distal esophagus is fluid-filled. Hepatobiliary: No focal liver abnormality is seen. Small stones within the gallbladder. No focal gallbladder wall thickening or pericholecystic inflammatory changes by CT. Pancreas: Pancreatic atrophy. No inflammatory changes or ductal dilatation. Spleen: Normal in size without focal abnormality. Adrenals/Urinary Tract: Adrenal glands are unremarkable. Kidneys are normal, without renal calculi, focal lesion, or hydronephrosis. Bladder is unremarkable. Stomach/Bowel: Stomach is within normal limits. Appendix appears normal (series 5, image 53). Colonic diverticulosis. No evidence of bowel wall thickening, distention,  or inflammatory changes. Moderate-large volume stool throughout the colon. Vascular/Lymphatic: Extensive aortoiliac atherosclerotic calcifications without aneurysm. No abdominopelvic lymphadenopathy. Reproductive: No mass or other abnormality. Other: No free fluid. No abdominopelvic fluid collection. No pneumoperitoneum. No abdominal wall hernia. Musculoskeletal: Thoracolumbar dextrocurvature multilevel spondylosis. No acute osseous abnormality. IMPRESSION: 1. No acute abdominopelvic findings. Normal appendix. 2. Moderate-large volume stool throughout the colon. 3. Dependent airspace consolidation within the lingula and bilateral lower lobes, suspicious for pneumonia and/or aspiration. 4. Trace bilateral pleural effusions. 5. Fluid-filled distal esophagus. Correlate for reflux. 6. Cholelithiasis without evidence of acute cholecystitis. 7. Aortic atherosclerosis (ICD10-I70.0). Electronically Signed   By: Davina Poke D.O.   On: August 08, 2022 10:19   DG Chest Portable 1 View  Result Date: 08/08/2022 CLINICAL DATA:  Shortness of breath EXAM: PORTABLE CHEST 1 VIEW COMPARISON:  Previous studies including the examination of 02/15/2022 FINDINGS: Transverse diameter of heart is increased.  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 There are no signs of alveolar pulmonary edema. There is no focal pulmonary consolidation. There is no pleural effusion or pneumothorax. IMPRESSION: Cardiomegaly. There are no signs of pulmonary edema or focal pulmonary consolidation. Electronically Signed   By: Elmer Picker M.D.   On: 22-Jul-2022 07:55    Procedures Procedures    Medications Ordered in ED Medications  pantoprazole (PROTONIX) injection 40 mg (40 mg Intravenous Given Jul 22, 2022 0923)  iohexol (OMNIPAQUE) 300 MG/ML solution 100 mL (100 mLs Intravenous Contrast Given 07-22-22 0955)    ED Course/ Medical Decision Making/ A&P                             Medical Decision Making Amount and/or Complexity of Data  Reviewed Labs: ordered. Radiology: ordered.  Risk Prescription drug management. Decision regarding hospitalization.   Patient with hypoxia.  Reportedly short of breath.  History of COPD and CHF.  Does appear somewhat pale however.  Anemia is on the differential diagnosis.  No edema on his legs and lungs are overall rather clear clear.  He is on anticoagulation for the A-fib.  However reviewed cardiology notes and it mentions both Eliquis and Xarelto and previously been on Coumadin.  Will also get x-ray and some blood work.  X-ray reviewed and reassuring.  No pneumonia seen.  However does have a hemoglobin of 4.  Guaiac positive but had brown stool.  CT scan done due to tenderness in abdomen.  Family ember now here and states he has had a little bit of vomiting that had some dark aspect to it.  Also had abdominal pain and had to get laxative for it.  Will admit to hospitalist.  Will need transfusion.  Will also let gastroenterology know about patient.  IV Protonix has been given.  Discussed with Dr. Abbey Chatters.  Clear liquids today.  IV Protonix 40 mg twice a day.  He will see in consult. CRITICAL CARE Performed by: Davonna Belling Total critical care time: 30 minutes Critical care time was exclusive of separately billable procedures and treating other patients. Critical care was necessary to treat or prevent imminent or life-threatening deterioration. Critical care was time spent personally by me on the following activities: development of treatment plan with patient and/or surrogate as well as nursing, discussions with consultants, evaluation of patient's response to treatment, examination of patient, obtaining history from patient or surrogate, ordering and performing treatments and interventions, ordering and review of laboratory studies, ordering and review of radiographic studies, pulse oximetry and re-evaluation of patient's condition.  Head discussed with Dr. Dyann Kief from hospitalist.   Pending bed upstairs, however called to see patient due to respiratory distress.  Had loud breath sounds from the foot of the bed.  And was more confused.  He had pulled out his IV.  Had had <100 cc of blood.  Had also pulled his pulse ox off.  X-ray ordered.  However patient became more bradycardic.  Discussed with nurse who said that the patient's daughter said he was a DNR and would not want CPR.  I called the patient's daughter who was still in the parking lot.  She verify that he would not want extraordinary measures.  Patient would have required intubation and further support.  However with everything and been through they had had long discussions about this.  Patient's daughter came to bedside.  Patient became more bradycardic.  Expired at 1155.  Not a medical  examiner case.  Dr. Dyann Kief also had been at bedside.  X-ray had been ordered during the acute decompensation.  Show potential pneumonia.  Not present on first x-ray however CT scan potentially showed it.  Had had no real change in cough at baseline.  Potentially does have pneumonia that was flushed out with a little bit of the fluid, however I think more likely the decompensation was just due to overall illness and from having a hemoglobin of 4 for a while now.        Final Clinical Impression(s) / ED Diagnoses Final diagnoses:  Anemia, unspecified type  Gastrointestinal hemorrhage, unspecified gastrointestinal hemorrhage type    Rx / DC Orders ED Discharge Orders     None         Davonna Belling, MD Jul 29, 2022 1218

## 2022-08-01 NOTE — ED Triage Notes (Signed)
Pt arrived via [facility] c/o Sob x 3 days that is worse with exertion , Per EMS, pt was 85% on RA and EMS placed on 4 L [place] and came up to 95%. 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 Per EMS pt had two duo nebs at home. Hx of a fib, copd, CHF. Decreased appetite x a few days. Blind in L eye

## 2022-08-01 NOTE — ED Notes (Signed)
Dr. Alvino Chapel at bedside with daughter. Time of death called at 11:55.

## 2022-08-01 NOTE — ED Notes (Signed)
POC occult positive--MD made aware

## 2022-08-01 NOTE — Progress Notes (Signed)
   GI consult had been requested. Pt expired prior to being seen.   Annitta Needs, PhD, ANP-BC [facility]

## 2022-08-01 DEATH — deceased
